# Patient Record
Sex: Female | Born: 1940
Health system: Southern US, Community
[De-identification: ages and names within clinical notes are randomized; demographics above are authoritative.]

## PROBLEM LIST (undated history)

## (undated) DIAGNOSIS — E0590 Thyrotoxicosis, unspecified without thyrotoxic crisis or storm: Secondary | ICD-10-CM

## (undated) DIAGNOSIS — M1990 Unspecified osteoarthritis, unspecified site: Secondary | ICD-10-CM

## (undated) DIAGNOSIS — T8859XA Other complications of anesthesia, initial encounter: Secondary | ICD-10-CM

## (undated) DIAGNOSIS — F32A Depression, unspecified: Secondary | ICD-10-CM

## (undated) DIAGNOSIS — T4145XA Adverse effect of unspecified anesthetic, initial encounter: Secondary | ICD-10-CM

## (undated) DIAGNOSIS — E0500 Thyrotoxicosis with diffuse goiter without thyrotoxic crisis or storm: Secondary | ICD-10-CM

## (undated) DIAGNOSIS — G8929 Other chronic pain: Secondary | ICD-10-CM

## (undated) DIAGNOSIS — M542 Cervicalgia: Secondary | ICD-10-CM

## (undated) DIAGNOSIS — E785 Hyperlipidemia, unspecified: Secondary | ICD-10-CM

## (undated) DIAGNOSIS — M19012 Primary osteoarthritis, left shoulder: Secondary | ICD-10-CM

## (undated) DIAGNOSIS — Z9289 Personal history of other medical treatment: Secondary | ICD-10-CM

## (undated) DIAGNOSIS — I10 Essential (primary) hypertension: Secondary | ICD-10-CM

## (undated) DIAGNOSIS — M797 Fibromyalgia: Secondary | ICD-10-CM

## (undated) DIAGNOSIS — R1310 Dysphagia, unspecified: Secondary | ICD-10-CM

## (undated) DIAGNOSIS — F419 Anxiety disorder, unspecified: Secondary | ICD-10-CM

## (undated) DIAGNOSIS — S92353A Displaced fracture of fifth metatarsal bone, unspecified foot, initial encounter for closed fracture: Secondary | ICD-10-CM

## (undated) DIAGNOSIS — F329 Major depressive disorder, single episode, unspecified: Secondary | ICD-10-CM

## (undated) HISTORY — DX: Unspecified osteoarthritis, unspecified site: M19.90

## (undated) HISTORY — PX: OTHER SURGICAL HISTORY: SHX169

## (undated) HISTORY — DX: Primary osteoarthritis, left shoulder: M19.012

## (undated) HISTORY — DX: Hyperlipidemia, unspecified: E78.5

## (undated) HISTORY — PX: SHOULDER SURGERY: SHX246

## (undated) HISTORY — DX: Cervicalgia: M54.2

## (undated) HISTORY — PX: TONSILLECTOMY: SUR1361

## (undated) HISTORY — DX: Depression, unspecified: F32.A

## (undated) HISTORY — DX: Major depressive disorder, single episode, unspecified: F32.9

## (undated) HISTORY — PX: ABDOMINAL HYSTERECTOMY: SHX81

## (undated) HISTORY — DX: Anxiety disorder, unspecified: F41.9

## (undated) HISTORY — PX: CHOLECYSTECTOMY: SHX55

## (undated) HISTORY — DX: Dysphagia, unspecified: R13.10

## (undated) HISTORY — DX: Essential (primary) hypertension: I10

## (undated) HISTORY — PX: OOPHORECTOMY: SHX86

## (undated) HISTORY — DX: Displaced fracture of fifth metatarsal bone, unspecified foot, initial encounter for closed fracture: S92.353A

## (undated) HISTORY — DX: Other chronic pain: G89.29

---

## 1978-03-14 HISTORY — PX: RHINOPLASTY: SUR1284

## 1998-02-17 ENCOUNTER — Ambulatory Visit (HOSPITAL_COMMUNITY): Admission: RE | Admit: 1998-02-17 | Discharge: 1998-02-17 | Payer: Self-pay | Admitting: Obstetrics and Gynecology

## 1998-04-24 ENCOUNTER — Inpatient Hospital Stay (HOSPITAL_COMMUNITY): Admission: RE | Admit: 1998-04-24 | Discharge: 1998-04-27 | Payer: Self-pay | Admitting: Obstetrics and Gynecology

## 1998-10-01 ENCOUNTER — Ambulatory Visit (HOSPITAL_COMMUNITY): Admission: RE | Admit: 1998-10-01 | Discharge: 1998-10-01 | Payer: Self-pay | Admitting: Family Medicine

## 2002-01-08 ENCOUNTER — Encounter: Payer: Self-pay | Admitting: Internal Medicine

## 2002-01-08 ENCOUNTER — Ambulatory Visit (HOSPITAL_COMMUNITY): Admission: RE | Admit: 2002-01-08 | Discharge: 2002-01-08 | Payer: Self-pay | Admitting: Gastroenterology

## 2002-01-08 LAB — HM COLONOSCOPY

## 2004-03-14 HISTORY — PX: OTHER SURGICAL HISTORY: SHX169

## 2004-11-12 LAB — CONVERTED CEMR LAB: Pap Smear: NORMAL

## 2004-12-08 ENCOUNTER — Ambulatory Visit (HOSPITAL_BASED_OUTPATIENT_CLINIC_OR_DEPARTMENT_OTHER): Admission: RE | Admit: 2004-12-08 | Discharge: 2004-12-08 | Payer: Self-pay | Admitting: Orthopedic Surgery

## 2004-12-08 ENCOUNTER — Ambulatory Visit (HOSPITAL_COMMUNITY): Admission: RE | Admit: 2004-12-08 | Discharge: 2004-12-08 | Payer: Self-pay | Admitting: Orthopedic Surgery

## 2005-06-08 ENCOUNTER — Ambulatory Visit (HOSPITAL_BASED_OUTPATIENT_CLINIC_OR_DEPARTMENT_OTHER): Admission: RE | Admit: 2005-06-08 | Discharge: 2005-06-08 | Payer: Self-pay | Admitting: Orthopedic Surgery

## 2006-02-14 ENCOUNTER — Ambulatory Visit: Payer: Self-pay | Admitting: Internal Medicine

## 2006-02-14 LAB — CONVERTED CEMR LAB
ALT: 22 units/L (ref 0–40)
BUN: 17 mg/dL (ref 6–23)
Chloride: 107 meq/L (ref 96–112)
Chol/HDL Ratio, serum: 2
Creatinine, Ser: 0.9 mg/dL (ref 0.4–1.2)
Glucose, Bld: 98 mg/dL (ref 70–99)
LDL Cholesterol: 71 mg/dL (ref 0–99)
Triglyceride fasting, serum: 40 mg/dL (ref 0–149)

## 2006-04-25 ENCOUNTER — Encounter: Admission: RE | Admit: 2006-04-25 | Discharge: 2006-04-25 | Payer: Self-pay | Admitting: Orthopedic Surgery

## 2006-05-17 ENCOUNTER — Ambulatory Visit: Payer: Self-pay | Admitting: Internal Medicine

## 2006-05-17 ENCOUNTER — Encounter: Admission: RE | Admit: 2006-05-17 | Discharge: 2006-05-17 | Payer: Self-pay | Admitting: Internal Medicine

## 2006-05-17 LAB — CONVERTED CEMR LAB
Eosinophils Absolute: 0.1 10*3/uL (ref 0.0–0.6)
Eosinophils Relative: 2.9 % (ref 0.0–5.0)
Hemoglobin: 13.2 g/dL (ref 12.0–15.0)
MCV: 90.8 fL (ref 78.0–100.0)
Monocytes Absolute: 0.4 10*3/uL (ref 0.2–0.7)
Monocytes Relative: 8 % (ref 3.0–11.0)
Neutro Abs: 3 10*3/uL (ref 1.4–7.7)
Sed Rate: 19 mm/hr (ref 0–25)
Total CK: 120 units/L (ref 7–177)

## 2006-05-23 ENCOUNTER — Ambulatory Visit (HOSPITAL_COMMUNITY): Admission: RE | Admit: 2006-05-23 | Discharge: 2006-05-24 | Payer: Self-pay | Admitting: Orthopedic Surgery

## 2006-06-26 ENCOUNTER — Ambulatory Visit: Payer: Self-pay | Admitting: Internal Medicine

## 2006-06-26 LAB — CONVERTED CEMR LAB
Anti Nuclear Antibody(ANA): NEGATIVE
BUN: 13 mg/dL (ref 6–23)
Calcium: 9.8 mg/dL (ref 8.4–10.5)
Chloride: 108 meq/L (ref 96–112)
Creatinine, Ser: 0.9 mg/dL (ref 0.4–1.2)
Folate: 15.3 ng/mL
GFR calc Af Amer: 81 mL/min
Glucose, Bld: 95 mg/dL (ref 70–99)
Sed Rate: 17 mm/hr (ref 0–25)
Sodium: 143 meq/L (ref 135–145)
TSH: 1.91 microintl units/mL (ref 0.35–5.50)

## 2006-07-11 ENCOUNTER — Encounter: Payer: Self-pay | Admitting: Internal Medicine

## 2006-07-19 ENCOUNTER — Encounter: Payer: Self-pay | Admitting: Internal Medicine

## 2006-07-27 ENCOUNTER — Encounter: Payer: Self-pay | Admitting: Internal Medicine

## 2006-08-14 ENCOUNTER — Encounter: Payer: Self-pay | Admitting: Internal Medicine

## 2006-08-30 ENCOUNTER — Encounter: Payer: Self-pay | Admitting: Internal Medicine

## 2006-09-25 ENCOUNTER — Ambulatory Visit: Payer: Self-pay | Admitting: Internal Medicine

## 2006-09-25 DIAGNOSIS — M1990 Unspecified osteoarthritis, unspecified site: Secondary | ICD-10-CM

## 2006-09-25 DIAGNOSIS — E785 Hyperlipidemia, unspecified: Secondary | ICD-10-CM | POA: Insufficient documentation

## 2006-09-25 DIAGNOSIS — M542 Cervicalgia: Secondary | ICD-10-CM

## 2006-09-25 DIAGNOSIS — I10 Essential (primary) hypertension: Secondary | ICD-10-CM

## 2006-09-28 ENCOUNTER — Ambulatory Visit (HOSPITAL_COMMUNITY): Admission: RE | Admit: 2006-09-28 | Discharge: 2006-09-29 | Payer: Self-pay | Admitting: Orthopedic Surgery

## 2006-11-09 ENCOUNTER — Encounter: Payer: Self-pay | Admitting: Internal Medicine

## 2007-01-16 ENCOUNTER — Ambulatory Visit: Payer: Self-pay | Admitting: Internal Medicine

## 2007-02-02 ENCOUNTER — Ambulatory Visit: Payer: Self-pay | Admitting: Internal Medicine

## 2007-02-21 ENCOUNTER — Encounter: Payer: Self-pay | Admitting: Internal Medicine

## 2007-02-23 ENCOUNTER — Encounter (INDEPENDENT_AMBULATORY_CARE_PROVIDER_SITE_OTHER): Payer: Self-pay | Admitting: *Deleted

## 2007-03-15 DIAGNOSIS — Z9289 Personal history of other medical treatment: Secondary | ICD-10-CM

## 2007-03-15 HISTORY — DX: Personal history of other medical treatment: Z92.89

## 2007-03-15 HISTORY — PX: BACK SURGERY: SHX140

## 2007-05-15 ENCOUNTER — Telehealth (INDEPENDENT_AMBULATORY_CARE_PROVIDER_SITE_OTHER): Payer: Self-pay | Admitting: *Deleted

## 2007-07-24 ENCOUNTER — Ambulatory Visit: Payer: Self-pay | Admitting: Internal Medicine

## 2007-08-27 ENCOUNTER — Encounter: Payer: Self-pay | Admitting: Internal Medicine

## 2007-09-06 ENCOUNTER — Encounter: Payer: Self-pay | Admitting: Internal Medicine

## 2007-09-19 ENCOUNTER — Encounter: Admission: RE | Admit: 2007-09-19 | Discharge: 2007-09-19 | Payer: Self-pay | Admitting: Orthopedic Surgery

## 2007-10-03 ENCOUNTER — Encounter: Admission: RE | Admit: 2007-10-03 | Discharge: 2007-10-03 | Payer: Self-pay | Admitting: Orthopedic Surgery

## 2007-10-29 ENCOUNTER — Ambulatory Visit: Payer: Self-pay | Admitting: Internal Medicine

## 2007-10-31 LAB — CONVERTED CEMR LAB
ALT: 30 units/L (ref 0–35)
AST: 26 units/L (ref 0–37)
LDL Cholesterol: 78 mg/dL (ref 0–99)
Total CHOL/HDL Ratio: 2.2
Triglycerides: 61 mg/dL (ref 0–149)
VLDL: 12 mg/dL (ref 0–40)

## 2007-11-02 ENCOUNTER — Telehealth: Payer: Self-pay | Admitting: Internal Medicine

## 2007-11-05 ENCOUNTER — Inpatient Hospital Stay (HOSPITAL_COMMUNITY): Admission: RE | Admit: 2007-11-05 | Discharge: 2007-11-09 | Payer: Self-pay | Admitting: Neurosurgery

## 2007-11-12 ENCOUNTER — Telehealth: Payer: Self-pay | Admitting: Internal Medicine

## 2007-11-14 ENCOUNTER — Telehealth: Payer: Self-pay | Admitting: Internal Medicine

## 2007-11-16 ENCOUNTER — Encounter: Payer: Self-pay | Admitting: Internal Medicine

## 2007-11-28 ENCOUNTER — Telehealth: Payer: Self-pay | Admitting: Internal Medicine

## 2007-12-11 ENCOUNTER — Encounter: Admission: RE | Admit: 2007-12-11 | Discharge: 2007-12-11 | Payer: Self-pay | Admitting: Neurosurgery

## 2008-02-04 ENCOUNTER — Encounter: Payer: Self-pay | Admitting: Internal Medicine

## 2008-02-25 ENCOUNTER — Encounter: Payer: Self-pay | Admitting: Internal Medicine

## 2008-02-28 ENCOUNTER — Ambulatory Visit: Payer: Self-pay | Admitting: Internal Medicine

## 2008-03-10 ENCOUNTER — Encounter (INDEPENDENT_AMBULATORY_CARE_PROVIDER_SITE_OTHER): Payer: Self-pay | Admitting: *Deleted

## 2008-05-12 HISTORY — PX: NECK SURGERY: SHX720

## 2008-05-13 ENCOUNTER — Encounter: Admission: RE | Admit: 2008-05-13 | Discharge: 2008-05-13 | Payer: Self-pay | Admitting: Neurosurgery

## 2008-05-23 ENCOUNTER — Encounter: Admission: RE | Admit: 2008-05-23 | Discharge: 2008-05-23 | Payer: Self-pay | Admitting: Neurosurgery

## 2008-06-05 ENCOUNTER — Inpatient Hospital Stay (HOSPITAL_COMMUNITY): Admission: RE | Admit: 2008-06-05 | Discharge: 2008-06-06 | Payer: Self-pay | Admitting: Neurosurgery

## 2008-07-03 ENCOUNTER — Ambulatory Visit: Payer: Self-pay | Admitting: Internal Medicine

## 2008-07-03 ENCOUNTER — Telehealth (INDEPENDENT_AMBULATORY_CARE_PROVIDER_SITE_OTHER): Payer: Self-pay | Admitting: *Deleted

## 2008-07-07 LAB — CONVERTED CEMR LAB
Calcium: 9.7 mg/dL (ref 8.4–10.5)
Chloride: 109 meq/L (ref 96–112)
Creatinine, Ser: 0.9 mg/dL (ref 0.4–1.2)
Glucose, Bld: 104 mg/dL — ABNORMAL HIGH (ref 70–99)
Lymphocytes Relative: 30.1 % (ref 12.0–46.0)
Neutrophils Relative %: 58.5 % (ref 43.0–77.0)
Potassium: 3.7 meq/L (ref 3.5–5.1)
RBC: 4.31 M/uL (ref 3.87–5.11)

## 2008-09-29 ENCOUNTER — Encounter: Payer: Self-pay | Admitting: Internal Medicine

## 2008-10-09 ENCOUNTER — Telehealth (INDEPENDENT_AMBULATORY_CARE_PROVIDER_SITE_OTHER): Payer: Self-pay | Admitting: *Deleted

## 2008-11-06 ENCOUNTER — Encounter: Payer: Self-pay | Admitting: Internal Medicine

## 2008-11-06 ENCOUNTER — Ambulatory Visit: Payer: Self-pay | Admitting: Internal Medicine

## 2008-11-06 ENCOUNTER — Other Ambulatory Visit: Admission: RE | Admit: 2008-11-06 | Discharge: 2008-11-06 | Payer: Self-pay | Admitting: Internal Medicine

## 2008-11-06 LAB — HM PAP SMEAR

## 2008-11-06 LAB — CONVERTED CEMR LAB: Vit D, 25-Hydroxy: 27 ng/mL — ABNORMAL LOW (ref 30–89)

## 2008-11-07 ENCOUNTER — Encounter (INDEPENDENT_AMBULATORY_CARE_PROVIDER_SITE_OTHER): Payer: Self-pay | Admitting: *Deleted

## 2008-11-10 LAB — CONVERTED CEMR LAB
ALT: 20 units/L (ref 0–35)
AST: 25 units/L (ref 0–37)
Cholesterol: 157 mg/dL (ref 0–200)
TSH: 2.03 microintl units/mL (ref 0.35–5.50)
VLDL: 10 mg/dL (ref 0.0–40.0)

## 2008-11-11 ENCOUNTER — Encounter (INDEPENDENT_AMBULATORY_CARE_PROVIDER_SITE_OTHER): Payer: Self-pay | Admitting: *Deleted

## 2008-11-11 ENCOUNTER — Encounter: Payer: Self-pay | Admitting: Internal Medicine

## 2008-12-02 ENCOUNTER — Encounter: Admission: RE | Admit: 2008-12-02 | Discharge: 2008-12-02 | Payer: Self-pay | Admitting: Neurosurgery

## 2008-12-02 ENCOUNTER — Ambulatory Visit: Payer: Self-pay | Admitting: Internal Medicine

## 2008-12-08 ENCOUNTER — Encounter (INDEPENDENT_AMBULATORY_CARE_PROVIDER_SITE_OTHER): Payer: Self-pay | Admitting: *Deleted

## 2008-12-08 LAB — CONVERTED CEMR LAB: Fecal Occult Bld: NEGATIVE

## 2008-12-31 ENCOUNTER — Telehealth (INDEPENDENT_AMBULATORY_CARE_PROVIDER_SITE_OTHER): Payer: Self-pay | Admitting: *Deleted

## 2009-02-24 ENCOUNTER — Ambulatory Visit: Payer: Self-pay | Admitting: Internal Medicine

## 2009-02-25 ENCOUNTER — Encounter: Payer: Self-pay | Admitting: Internal Medicine

## 2009-02-26 ENCOUNTER — Encounter: Payer: Self-pay | Admitting: Internal Medicine

## 2009-03-03 ENCOUNTER — Encounter: Admission: RE | Admit: 2009-03-03 | Discharge: 2009-03-03 | Payer: Self-pay | Admitting: Neurosurgery

## 2009-03-04 ENCOUNTER — Encounter: Payer: Self-pay | Admitting: Internal Medicine

## 2009-03-09 ENCOUNTER — Encounter: Payer: Self-pay | Admitting: Internal Medicine

## 2009-03-16 ENCOUNTER — Encounter: Payer: Self-pay | Admitting: Internal Medicine

## 2009-04-16 ENCOUNTER — Encounter: Payer: Self-pay | Admitting: Internal Medicine

## 2009-05-04 ENCOUNTER — Ambulatory Visit: Payer: Self-pay | Admitting: Internal Medicine

## 2009-10-12 ENCOUNTER — Encounter: Payer: Self-pay | Admitting: Internal Medicine

## 2009-11-02 ENCOUNTER — Ambulatory Visit: Payer: Self-pay | Admitting: Internal Medicine

## 2009-11-03 ENCOUNTER — Encounter (INDEPENDENT_AMBULATORY_CARE_PROVIDER_SITE_OTHER): Payer: Self-pay | Admitting: *Deleted

## 2009-11-06 LAB — CONVERTED CEMR LAB
BUN: 17 mg/dL (ref 6–23)
Basophils Relative: 0.6 % (ref 0.0–3.0)
Chloride: 107 meq/L (ref 96–112)
Cholesterol: 161 mg/dL (ref 0–200)
Eosinophils Relative: 2.4 % (ref 0.0–5.0)
GFR calc non Af Amer: 71.37 mL/min (ref >60)
HCT: 38.7 % (ref 36.0–46.0)
LDL Cholesterol: 73 mg/dL (ref 0–99)
MCV: 93.9 fL (ref 78.0–100.0)
Monocytes Relative: 8.3 % (ref 3.0–12.0)
Neutrophils Relative %: 61.9 % (ref 43.0–77.0)
Platelets: 149 10*3/uL — ABNORMAL LOW (ref 150.0–400.0)
Potassium: 4.1 meq/L (ref 3.5–5.1)
RBC: 4.12 M/uL (ref 3.87–5.11)
Total CHOL/HDL Ratio: 2
Triglycerides: 50 mg/dL (ref 0.0–149.0)
VLDL: 10 mg/dL (ref 0.0–40.0)
WBC: 4 10*3/uL — ABNORMAL LOW (ref 4.5–10.5)

## 2009-11-09 ENCOUNTER — Ambulatory Visit: Payer: Self-pay | Admitting: Internal Medicine

## 2009-11-11 ENCOUNTER — Encounter: Payer: Self-pay | Admitting: Internal Medicine

## 2009-11-12 ENCOUNTER — Ambulatory Visit (HOSPITAL_BASED_OUTPATIENT_CLINIC_OR_DEPARTMENT_OTHER): Admission: RE | Admit: 2009-11-12 | Discharge: 2009-11-12 | Payer: Self-pay | Admitting: Orthopedic Surgery

## 2009-11-12 HISTORY — PX: KNEE SURGERY: SHX244

## 2009-12-14 ENCOUNTER — Ambulatory Visit: Payer: Self-pay | Admitting: Internal Medicine

## 2009-12-23 ENCOUNTER — Telehealth: Payer: Self-pay | Admitting: Internal Medicine

## 2009-12-29 ENCOUNTER — Ambulatory Visit: Payer: Self-pay | Admitting: Internal Medicine

## 2010-03-12 ENCOUNTER — Encounter: Payer: Self-pay | Admitting: Internal Medicine

## 2010-04-05 ENCOUNTER — Encounter: Payer: Self-pay | Admitting: Orthopedic Surgery

## 2010-04-11 LAB — CONVERTED CEMR LAB
ALT: 22 units/L (ref 0–35)
AST: 25 units/L (ref 0–37)
BUN: 11 mg/dL (ref 6–23)
Basophils Absolute: 0 10*3/uL (ref 0.0–0.1)
Basophils Relative: 0.6 % (ref 0.0–1.0)
Calcium: 9.1 mg/dL (ref 8.4–10.5)
Chloride: 106 meq/L (ref 96–112)
Eosinophils Relative: 2.6 % (ref 0.0–5.0)
GFR calc non Af Amer: 67 mL/min
MCHC: 34.6 g/dL (ref 30.0–36.0)
Monocytes Absolute: 0.4 10*3/uL (ref 0.2–0.7)
Neutrophils Relative %: 63.4 % (ref 43.0–77.0)
Platelets: 157 10*3/uL (ref 150–400)
Potassium: 3.8 meq/L (ref 3.5–5.1)
Sodium: 140 meq/L (ref 135–145)

## 2010-04-15 NOTE — Consult Note (Signed)
Summary: [facility]  [facility]   Imported By: Lanelle Bal 03/25/2009 14:35:40  _____________________________________________________________________  External Attachment:    Type:   Image     Comment:   External Document

## 2010-04-15 NOTE — Letter (Signed)
Summary: [facility]: Immunoassay Fecal Occult Blood (iFOB) Order Form  [facility] [place]  [address] [place], [place] [address]   Phone: [phone]  Fax: [phone]      [facility]: Immunoassay Fecal Occult Blood (iFOB) Order Form   November 03, 2009 MRN: [id number]   Rebecca [name] 24-Aug-1940   Physicican Name:___jose paz,md______________________  Diagnosis Code:____v76.51______________________      Army Fossa CMA

## 2010-04-15 NOTE — Assessment & Plan Note (Signed)
Summary: rash...Marland Kitchen?SHINGLES//KN   Vital Signs:  Patient profile:   70 year old female Weight:      238.38 pounds Pulse rate:   88 / minute Pulse rhythm:   regular BP sitting:   132 / 88  (left arm) Cuff size:   large  Vitals Entered By: Army Fossa CMA (December 14, 2009 10:31 AM) CC: Possible shingles on arms??  Comments x 10 days [facility] [address] not fasting -burning feeling   History of Present Illness: 10 days ago developed spots in both breasts, they are fading away yesterday developed few-closed  together  spots in the R  arm. they don't itch or hurt but burn got a flu shot today  ROS (+) subjective F (+) itching all over no N V  Current Medications (verified): 1)  Zocor 40 Mg  Tabs (Simvastatin) .... One P.o. Nightly 2)  Fish Oil 3)  Calcium 4)  B Complex 5)  Cinnamon 500mg  6)  B12 1000mg  7)  D-3 1000 8)  B6 100mg  .... Bid 9)  Vitamin C 500mg  .... Two Times A Day 10)  Vicodin 5-500 Mg Tabs (Hydrocodone-Acetaminophen) .... Dr.murphy  Allergies: 1)  ! Oxycodone-Acetaminophen (Oxycodone-Acetaminophen) 2)  * Penicillins Group 3)  Fosamax (Alendronate Sodium)  Past History:  Past Medical History: Reviewed history from 11/02/2009 and no changes required. Hyperlipidemia Hypertension Osteoarthritis Osteoporosis chronic neck pain  Past Surgical History: Cholecystectomy Hysterectomy Oophorectomy Tonsillectomy CTS surgery B shoulder surgery x 2 2008  dr [place] back surgery August 2009 (Dr Wynetta Emery) Neck surgery March 2010 (Dr Wynetta Emery) transposed ulner nerve 10/14/08 (dr. Amanda Pea) R knee surgery 9-201  Physical Exam  General:  alert and well-developed.   Breasts:  no acute lesions in the breast skin Skin:  has 4 lesions in the forearm, they are at different stages , one ofthem is 2mm purulent blister whicj is cultured . no lesions in the chest, rest of the arm, shoulder    Impression & Recommendations:  Problem # 1:  CARBUNCLE AND FURUNCLE OF UPPER  ARM AND FOREARM (ICD-680.3) lesions not consistent w/  a shingles pattern, likely a boil, MRSA? plan: CX treat w/ doxy  Problem # 2:  HYPERLIPIDEMIA (ICD-272.4) patient wonders if she should go back to aspirin, I think so. Added  aspirin to her medication list Her updated medication list for this problem includes:    Zocor 40 Mg Tabs (Simvastatin) ..... One p.o. nightly  Complete Medication List: 1)  Zocor 40 Mg Tabs (Simvastatin) .... One p.o. nightly 2)  Fish Oil  3)  Calcium  4)  B Complex  5)  Cinnamon 500mg   6)  B12 1000mg   7)  D-3 1000  8)  B6 100mg   .... Bid 9)  Vitamin C 500mg   .... Two times a day 10)  Vicodin 5-500 Mg Tabs (Hydrocodone-acetaminophen) .... Dr.murphy 11)  Doxycycline Hyclate 100 Mg Caps (Doxycycline hyclate) .Marland Kitchen.. 1 by mouth two times a day x 5 days 12)  Aspirin 81 Mg Tbec (Aspirin) .Marland Kitchen.. 1 a day  Other Orders: T-Culture, Wound ([id number])  Patient Instructions: 1)  . Prescriptions: DOXYCYCLINE HYCLATE 100 MG CAPS (DOXYCYCLINE HYCLATE) 1 by mouth two times a day x 5 days  #10 x 0   Entered and Authorized by:   Nolon Rod. Cristol Engdahl MD   Signed by:   Nolon Rod. Keshonda Monsour MD on 12/14/2009   Method used:   Print then Give to Patient   RxID:   [id number]

## 2010-04-15 NOTE — Consult Note (Signed)
Summary: [facility]  [facility]   Imported By: Lanelle Bal 04/22/2009 10:51:29  _____________________________________________________________________  External Attachment:    Type:   Image     Comment:   External Document

## 2010-04-15 NOTE — Assessment & Plan Note (Signed)
Summary: CPX/FASTING/KDC   Vital Signs:  Patient profile:   70 year old female Height:      61 inches Weight:      232.25 pounds Pulse rate:   75 / minute Pulse rhythm:   regular BP sitting:   128 / 82  (left arm) Cuff size:   large  Vitals Entered By: Army Fossa CMA (November 02, 2009 8:00 AM) ACC: CPX: fasting Comments Pt unsure of when last Td was given. She believes she had a booster.    History of Present Illness: Here for Medicare AWV:  1.   Risk factors based on Past M, S, F history: yes  2.   Physical Activities: no exercise lately  3.   Depression/mood: mood  is stable, good days -bad days , husband has cancer , counseled  4.   Hearing: denies problems, had hearing checked  in February , was told it was ok  5.   ADL's: totally independent  6.   Fall Risk: low risk, no falls  7.   Home Safety: feels safe at home  8.   Height, weight, &visual acuity: see VS, corrected w/  glasses  9.   Counseling: yes, see above  10.   Labs ordered based on risk factors: yes  11.           Referral Coordination: if needed  12.           Care Plan: see a/p  13.            Cognitive Assessment: cognition, memory and alertness seemed fine  we also discussed the following:  Hyperlipidemia-- good medication compliance  Hypertension-- on no meds , occasionally checks ambulatory BPs < than 140/80 Osteoarthritis-- had problems w/  knee pain ,saw ortho       Current Medications (verified): 1)  Zocor 40 Mg  Tabs (Simvastatin) .... One P.o. Nightly 2)  Fish Oil 3)  Calcium 4)  B Complex 5)  Cinnamon 500mg  6)  B12 1000mg  7)  D-3 1000 8)  B6 100mg  .... Bid 9)  Vitamin C 500mg  .... Two Times A Day  Allergies: 1)  ! Oxycodone-Acetaminophen (Oxycodone-Acetaminophen) 2)  * Penicillins Group 3)  Fosamax (Alendronate Sodium)  Past History:  Past Medical History: Hyperlipidemia Hypertension Osteoarthritis Osteoporosis chronic neck pain  Past Surgical History: Reviewed  history from 11/06/2008 and no changes required. Cholecystectomy Hysterectomy Oophorectomy Tonsillectomy CTS surgery B shoulder surgery x 2 2008  dr [place] back surgery August 2009 (Dr Wynetta Emery) Neck surgery March 2010 (Dr Wynetta Emery) transposed ulner nerve 10/14/08 (dr. Amanda Pea)  Family History: Reviewed history from 01/16/2007 and no changes required. CAD-- sist, bro x 2 MI-- siblings in their 59s DM-- sister colon ca-- no breast ca-- no  Social History: Married husband dx w / cancer  4 children Never Smoked Alcohol use-no Drug use-no Regular exercise--no lately   Review of Systems CV:  Denies chest pain or discomfort and swelling of feet. Resp:  Denies cough and shortness of breath. GI:  Denies bloody stools, diarrhea, nausea, and vomiting. GU:  Denies discharge, dysuria, and hematuria.  Physical Exam  General:  alert, well-developed, and overweight-appearing.   Neck:  no masses, no thyromegaly, and normal carotid upstroke.   Breasts:  No mass, nodules, thickening, tenderness, bulging, retraction, inflamation, nipple discharge or skin changes noted.   Lungs:  normal respiratory effort, no intercostal retractions, no accessory muscle use, and normal breath sounds.   Heart:  normal rate, regular rhythm, no  murmur, and no gallop.   Abdomen:  soft, non-tender, no distention, no masses, no guarding, and no rigidity.   Extremities:  no pretibial edema bilaterally  Psych:  Oriented X3, memory intact for recent and remote, normally interactive, good eye contact, not anxious appearing, and not depressed appearing.     Impression & Recommendations:  Problem # 1:  HEALTH SCREENING (ICD-V70.0)  TD around 2005 per pt pneumonia shot around 2002 and 2008 shingles shot-- avoid for now, husband w/ cancer , he gets chemo   had a normal  colonoscopy at [facility] 2003 , Dr Randa Evens ----> iFOB neg 11-2008 iFOB provided today consider repeat Cscope 2013  s/p hysterectomy for fibroids, no h/o  abnormal PAPs had a neg PAP 2010, at the time there was a ? of  neovascularization , she saw Dr  Rana Snare ----> dx w/ athrophic vaginitis no further PAPs  MMG 12-10  neg  rec to continue w/  SBE   counseled: diet-exerciose-deperession (we talked about her husband's health)   Orders: Medicare -1st Annual Wellness Visit [phone])  Problem # 2:  ? of OSTEOPOROSIS (ICD-733.00) DEXA osteoprosis 2006  (per pt) DEXA  11-08 normal   rec continue w/ ca and vit D  ordering a DEXA----needs it done by November   Orders: T-Vitamin D (25-Hydroxy) [phone]) Radiology Referral (Radiology)  Problem # 3:  HYPERLIPIDEMIA (ICD-272.4) good medication compliance, not able to exercise   Her updated medication list for this problem includes:    Zocor 40 Mg Tabs (Simvastatin) ..... One p.o. nightly  Labs Reviewed: SGOT: 25 (11/06/2008)   SGPT: 20 (11/06/2008)   HDL:75.00 (11/06/2008), 77.7 (10/29/2007)  LDL:72 (11/06/2008), 78 (10/29/2007)  Chol:157 (11/06/2008), 168 (10/29/2007)  Trig:50.0 (11/06/2008), 61 (10/29/2007)  Orders: TLB-Lipid Panel (80061-LIPID) TLB-ALT (SGPT) (84460-ALT) TLB-AST (SGOT) (84450-SGOT)  Problem # 4:  SKIN LESION (ICD-709.9)  also -- reports a stable skin lesion x years at the medial aspect of R knee, recently it become scally, slightly  raised exam : 3x2 mm, slightly  papular, moderately dark lesion,slt rough texture  --has a spot at the R calf that has not healed x months plan: derm referal   Orders: Dermatology Referral (Derma)  Complete Medication List: 1)  Zocor 40 Mg Tabs (Simvastatin) .... One p.o. nightly 2)  Fish Oil  3)  Calcium  4)  B Complex  5)  Cinnamon 500mg   6)  B12 1000mg   7)  D-3 1000  8)  B6 100mg   .... Bid 9)  Vitamin C 500mg   .... Two times a day  Other Orders: Venipuncture (91478) TLB-BMP (Basic Metabolic Panel-BMET) (80048-METABOL) TLB-CBC Platelet - w/Differential (85025-CBCD)  Patient Instructions: 1)  Please schedule a  follow-up appointment in 6 months .

## 2010-04-15 NOTE — Progress Notes (Signed)
Summary: [facility]  Phone Note Call from Patient Call back at Home Phone [phone]   Summary of Call: Patient notes that she is now out of Doxycycline and has noticed more spots appearing on her arms. Should the patient take something else or come in for office visit? Please advise.  Initial call taken by: Lucious Groves CMA,  December 23, 2009 9:03 AM  Follow-up for Phone Call        OV please  Nolon Rod. Paz MD  December 23, 2009 10:12 AM   Additional Follow-up for Phone Call Additional follow up Details #1::        Patient notified and will come today. Additional Follow-up by: Lucious Groves CMA,  December 23, 2009 11:24 AM

## 2010-04-15 NOTE — Assessment & Plan Note (Signed)
Summary: 6 MTH FU/NS/KDC   Vital Signs:  Patient profile:   70 year old female Height:      61 inches Weight:      234.2 pounds BMI:     44.41 Pulse rate:   70 / minute BP sitting:   130 / 80  Vitals Entered By: Shary Decamp (May 04, 2009 8:04 AM) CC: rov - fasting   History of Present Illness: ROV doing well    Current Medications (verified): 1)  Zocor 40 Mg  Tabs (Simvastatin) .... One P.o. Nightly 2)  Fish Oil 3)  Calcium 4)  B Complex 5)  Cinnamon 500mg  6)  B12 1000mg  7)  D-3 1000 8)  B6 100mg  .... Bid 9)  Vitamin C 500mg  .... Two Times A Day  Allergies (verified): 1)  ! Oxycodone-Acetaminophen (Oxycodone-Acetaminophen) 2)  * Penicillins Group 3)  Fosamax (Alendronate Sodium)  Past History:  Past Medical History: Hyperlipidemia Hypertension Osteoarthritis Osteoporosis chronic neck pain  Past Surgical History: Reviewed history from 11/06/2008 and no changes required. Cholecystectomy Hysterectomy Oophorectomy Tonsillectomy CTS surgery B shoulder surgery x 2 2008  dr [place] back surgery August 2009 (Dr Wynetta Emery) Neck surgery March 2010 (Dr Wynetta Emery) transposed ulner nerve 10/14/08 (dr. Amanda Pea)  Social History: Reviewed history from 11/06/2008 and no changes required. Married 4 children Never Smoked Alcohol use-no Drug use-no Regular exercise-yes (walking)  Review of Systems       s/p ENT eval , had a ear effusion, s/p tube placement , feels great Hyperlipidemia--  on zocor, good medication compliance  Hypertension-- varies from 118/67 to122/73    Physical Exam  General:  alert and overweight-appearing.   Lungs:  normal respiratory effort, no intercostal retractions, no accessory muscle use, and normal breath sounds.   Heart:  normal rate, regular rhythm, and no murmur.   Extremities:  no pretibial edema bilaterally    Impression & Recommendations:  Problem # 1:  HYPERTENSION (ICD-401.9) normal BPs  BP today: 130/80 Prior BP:  126/80 (02/24/2009)  Labs Reviewed: K+: 3.7 (07/03/2008) Creat: : 0.9 (07/03/2008)   Chol: 157 (11/06/2008)   HDL: 75.00 (11/06/2008)   LDL: 72 (11/06/2008)   TG: 50.0 (11/06/2008)  Problem # 2:  HYPERLIPIDEMIA (ICD-272.4) doing well , healthy lifestyle encouraged  Her updated medication list for this problem includes:    Zocor 40 Mg Tabs (Simvastatin) ..... One p.o. nightly  Labs Reviewed: SGOT: 25 (11/06/2008)   SGPT: 20 (11/06/2008)   HDL:75.00 (11/06/2008), 77.7 (10/29/2007)  LDL:72 (11/06/2008), 78 (10/29/2007)  Chol:157 (11/06/2008), 168 (10/29/2007)  Trig:50.0 (11/06/2008), 61 (10/29/2007)  Problem # 3:  ? of OSTEOPOROSIS (ICD-733.00) DEXA osteoprosis 2006  (per pt) DEXA  11-08 normal  pt wonders when her next DEXA should be  , I think 1 or 2 years is ok, continue vit d and keep herself active   Complete Medication List: 1)  Zocor 40 Mg Tabs (Simvastatin) .... One p.o. nightly 2)  Fish Oil  3)  Calcium  4)  B Complex  5)  Cinnamon 500mg   6)  B12 1000mg   7)  D-3 1000  8)  B6 100mg   .... Bid 9)  Vitamin C 500mg   .... Two times a day  Patient Instructions: 1)  Please schedule a follow-up appointment in 6 months  (fasting-- yearly) Prescriptions: ZOCOR 40 MG  TABS (SIMVASTATIN) one p.o. nightly  #90 x 3   Entered by:   Shary Decamp   Authorized by:   Nolon Rod. Paz MD   Signed by:  Shary Decamp on 05/04/2009   Method used:   Print then Give to Patient   RxID:   [id number]

## 2010-04-15 NOTE — Consult Note (Signed)
Summary: [facility]  [facility]   Imported By: Lanelle Bal 10/22/2009 10:53:45  _____________________________________________________________________  External Attachment:    Type:   Image     Comment:   External Document

## 2010-04-15 NOTE — Letter (Signed)
Summary: [facility]  [facility]   Imported By: Lanelle Bal 03/18/2009 11:12:38  _____________________________________________________________________  External Attachment:    Type:   Image     Comment:   External Document

## 2010-04-15 NOTE — Assessment & Plan Note (Signed)
Summary: SHINGLES VACCINE/RH........  Nurse Visit   Allergies: 1)  ! Oxycodone-Acetaminophen (Oxycodone-Acetaminophen) 2)  * Penicillins Group 3)  Fosamax (Alendronate Sodium)  Immunizations Administered:  Zostavax # 1:    Vaccine Type: Zostavax    Site: right deltoid    Mfr: Merck    Dose: 0.5 ml    Route: [place]    Given by: Army Fossa CMA    Exp. Date: 10/30/2010    Lot #: [id number]  Orders Added: 1)  Zoster (Shingles) Vaccine Live [90736] 2)  Admin 1st Vaccine [81191]

## 2010-04-15 NOTE — Assessment & Plan Note (Signed)
Summary: FLU SHOT, Jesilyn Easom PT/RH.......  Nurse Visit  CC: Flu shot./kb   Allergies: 1)  ! Oxycodone-Acetaminophen (Oxycodone-Acetaminophen) 2)  * Penicillins Group 3)  Fosamax (Alendronate Sodium)  Orders Added: 1)  Flu Vaccine 26yrs + MEDICARE PATIENTS [Q2039] 2)  Administration Flu vaccine - MCR [G0008]        Flu Vaccine Consent Questions     Do you have a history of severe allergic reactions to this vaccine? no    Any prior history of allergic reactions to egg and/or gelatin? no    Do you have a sensitivity to the preservative Thimersol? no    Do you have a past history of Guillan-Barre Syndrome? no    Do you currently have an acute febrile illness? no    Have you ever had a severe reaction to latex? no    Vaccine information given and explained to patient? yes    Are you currently pregnant? no    Lot Number:[id number]   Exp Date:09/11/2010   Site Given  Left Deltoid IMu

## 2010-05-05 ENCOUNTER — Ambulatory Visit (INDEPENDENT_AMBULATORY_CARE_PROVIDER_SITE_OTHER): Payer: PRIVATE HEALTH INSURANCE | Admitting: Internal Medicine

## 2010-05-05 ENCOUNTER — Encounter: Payer: Self-pay | Admitting: Internal Medicine

## 2010-05-05 DIAGNOSIS — E785 Hyperlipidemia, unspecified: Secondary | ICD-10-CM

## 2010-05-05 DIAGNOSIS — I10 Essential (primary) hypertension: Secondary | ICD-10-CM

## 2010-05-11 NOTE — Assessment & Plan Note (Signed)
Summary: 6 MONTH ROV/CBS   Vital Signs:  Patient profile:   70 year old female Height:      61 inches Weight:      234.50 pounds BMI:     44.47 Pulse rate:   78 / minute Pulse rhythm:   regular BP sitting:   142 / 86  (left arm) Cuff size:   large  Vitals Entered By: Army Fossa CMA (May 05, 2010 9:56 AM) CC: 6 month f/u- fasting  Comments no complaints  [facility] [address]    History of Present Illness: ROV doing well  ROS good medication compliance w/ cholesterol meds plans to start a exercise program, so far has not been very active ambulatory BPs < 138/80 husband was dx w/ cancer, s/p treatmet  Current Medications (verified): 1)  Zocor 40 Mg  Tabs (Simvastatin) .... One P.o. Nightly 2)  Fish Oil 3)  Calcium 4)  B Complex 5)  Cinnamon 500mg  6)  B12 1000mg  7)  D-3 1000 8)  B6 100mg  .... Bid 9)  Vitamin C 500mg  .... Two Times A Day 10)  Aspirin 81 Mg Tbec (Aspirin) .Marland Kitchen.. 1 A Day  Allergies (verified): 1)  ! Oxycodone-Acetaminophen (Oxycodone-Acetaminophen) 2)  * Penicillins Group 3)  Fosamax (Alendronate Sodium)  Past History:  Past Medical History: Reviewed history from 11/02/2009 and no changes required. Hyperlipidemia Hypertension Osteoarthritis Osteoporosis chronic neck pain  Past Surgical History: Reviewed history from 12/14/2009 and no changes required. Cholecystectomy Hysterectomy Oophorectomy Tonsillectomy CTS surgery B shoulder surgery x 2 2008  dr [place] back surgery August 2009 (Dr Wynetta Emery) Neck surgery March 2010 (Dr Wynetta Emery) transposed ulner nerve 10/14/08 (dr. Amanda Pea) R knee surgery 9-201  Physical Exam  General:  alert, well-developed, and overweight-appearing.   Lungs:  normal respiratory effort, no intercostal retractions, no accessory muscle use, and normal breath sounds.   Heart:  normal rate, regular rhythm, no murmur, and no gallop.   Psych:  Oriented X3, memory intact for recent and remote, normally interactive, good  eye contact, not anxious appearing, and not depressed appearing.     Impression & Recommendations:  Problem # 1:  HYPERTENSION (ICD-401.9) ambulatory BPs at goal  BP today: 142/86 Prior BP: 132/88 (12/14/2009)  Labs Reviewed: K+: 4.1 (11/02/2009) Creat: : 0.8 (11/02/2009)   Chol: 161 (11/02/2009)   HDL: 78.20 (11/02/2009)   LDL: 73 (11/02/2009)   TG: 50.0 (11/02/2009)  Problem # 2:  HYPERLIPIDEMIA (ICD-272.4) well controlled  Her updated medication list for this problem includes:    Zocor 40 Mg Tabs (Simvastatin) ..... One p.o. nightly  Labs Reviewed: SGOT: 26 (11/02/2009)   SGPT: 23 (11/02/2009)   HDL:78.20 (11/02/2009), 75.00 (11/06/2008)  LDL:73 (11/02/2009), 72 (11/06/2008)  Chol:161 (11/02/2009), 157 (11/06/2008)  Trig:50.0 (11/02/2009), 50.0 (11/06/2008)  Problem # 3:  discussed her husband's health F2F 15 min plus   Complete Medication List: 1)  Zocor 40 Mg Tabs (Simvastatin) .... One p.o. nightly 2)  Fish Oil  3)  Calcium  4)  B Complex  5)  Cinnamon 500mg   6)  B12 1000mg   7)  D-3 1000  8)  B6 100mg   .... Bid 9)  Vitamin C 500mg   .... Two times a day 10)  Aspirin 81 Mg Tbec (Aspirin) .Marland Kitchen.. 1 a day  Patient Instructions: 1)  Please schedule a follow-up appointment in 6 months . Fasting Physical Exam    Orders Added: 1)  Est. Patient Level III [04540]

## 2010-05-27 LAB — POCT HEMOGLOBIN-HEMACUE: Hemoglobin: 13.8 g/dL (ref 12.0–15.0)

## 2010-06-03 ENCOUNTER — Other Ambulatory Visit: Payer: Self-pay | Admitting: *Deleted

## 2010-06-03 MED ORDER — SIMVASTATIN 40 MG PO TABS: 40.0000 mg | ORAL_TABLET | Freq: Every evening | ORAL | Status: DC

## 2010-06-24 LAB — CBC
HCT: 36.3 % (ref 36.0–46.0)
Hemoglobin: 12.5 g/dL (ref 12.0–15.0)
RBC: 4.05 MIL/uL (ref 3.87–5.11)
RDW: 13.6 % (ref 11.5–15.5)
WBC: 4.1 10*3/uL (ref 4.0–10.5)

## 2010-06-24 LAB — BASIC METABOLIC PANEL
Calcium: 9.8 mg/dL (ref 8.4–10.5)
GFR calc Af Amer: >60 mL/min (ref >60)
GFR calc non Af Amer: >60 mL/min (ref >60)
Potassium: 4.5 mEq/L (ref 3.5–5.1)
Sodium: 143 mEq/L (ref 135–145)

## 2010-07-27 NOTE — Op Note (Signed)
Rebecca [name], Rebecca [name]            ACCOUNT NO.:  [id number]   MEDICAL RECORD NO.:  [id number]          PATIENT TYPE:  INP   LOCATION:  3109                         FACILITY:  [facility]   PHYSICIAN:  Donalee Citrin, M.D.        DATE OF BIRTH:  May 24, 1940   DATE OF PROCEDURE:  11/05/2007  DATE OF DISCHARGE:                               OPERATIVE REPORT   PREOPERATIVE DIAGNOSIS:  1. Grade 2 spondylolisthesis L5-S1.  2. Degenerative disk disease with lumbar spinal stenosis L4-5.  3. Degenerative disk disease with lumbar spinal stenosis L3-4.  4. Degenerate scoliosis L3-S1.   PROCEDURE:  1. Gill decompression L5-S1.  2. Decompressive laminectomy L3-4, L4-5 and excess will be needed with      a standard interbody fusion.  3. Posterior lumbar interbody fusion L3-4, L4-5, and L5-S1 using a      hybrid Telamon PEEK cage packed with local autograft on one side      and with tangent allograft wedge on the other.  4. Pedicle screw fixation L3-S1 using 6.35 Legacy Pedicle Screw      System.  5. Posterolateral arthrodesis L3-S1 using local harvested autograft      mixed with Actifuse.  6. Open reduction spinal deformity L5-S1 with a spondylolisthesis as      well as further degenerative scoliosis L3-S1 and placement of      medium Hemovac drain.   SURGEON:  Donalee Citrin, MD   ASSISTANT:  Kathaleen Maser. Pool, MD   HISTORY OF PRESENT ILLNESS:  The patient is a very pleasant 70 year old  female who has had progressive worsening of the back and bilateral leg  pain worse on the left with intermittent foot drop.  The patient had  numbness, tingling, and weakness in dorsiflexion in her EHL.  The  patient's MRI scan showed a grade 2 spondylolisthesis with severe  foraminal stenosis entrapment in the L5 root but also the S1 root as  well as degenerative foraminal stenosis, degenerative disk disease, disk  herniation at L3-4 and L4-5, and degenerative scoliosis at L3-S1.  The  patient was recommended the risks  and benefits of the operation and  recommended surgery and proceeded forth.   The patient was brought to the OR, induced under general anesthesia,  positioned prone on a Wilson frame.  The back was prepped and draped in  usual sterile fashion.  No preoperative x-ray was done.  After midline  incision made in the low back, a subperiosteal dissection was carried  out on the lamina of L3, L4, L5 and S1.  TPs were exposed at L3, L4, L5  and S1.  The spinous process laminar complex at L5 was noted to be  hypermobile with complete pars defects at this level.  Intraoperative  fluoroscopy confirmed localization of the L3 TP so complete central  decompressions were performed removing the spinous process at L3, L4 and  L5 as well as complete medial facetectomies at L3-4, L4-5 and L5-S1.  There was a marked degenerative of facet complexes and complete  diastasis of the L3-4 facet on the right, complete facet arthropathy  with hour  glass compression of thecal sac and extensive fibrosis and  scar tissue around the pars defects overlying the  fibers noted to be  markedly stenotic flushed with the pedicle at L5-S1.  After complete  medial facetectomies were performed at all three levels, the L5 neural  foramen was unroofed.  The medial inferior border of the pedicle and  facet complex was drilled down to gain access and further unroofed L5  foramina bilaterally.  The L3 and L4 roots were completely unroofed and  decompressed bilaterally.  After all neural foramina were identified,  attention was taken to first pedicle screw placement at L3-4.  Pedicles  drilled with high-speed drill, cannula with the awl probed, tapped with  a 5-5 tap, probed again and 6 x 50 screw inserted L3 bilaterally.  Fluoroscopy was used at each step along the way as well as both external  and internal bony landmarks.  The four screws were also placed in a  similar fashion with 6 x 45 screws inserted at L4.  After these four   screws inserted.  Attention was taken to the interbody work first  working at L5-S1 to reduce the deformity.  The interspace was identified  underneath the L5 root and cleaned out.  Large posterior osteophytes  were bitten off the posterior aspect of the S1 vertebral body to gain  access to the disk space.  A size 7 distractor was inserted on the left  side sequentially and 8 on the right back to a 9 on the left.  This  significantly reduced the deformity at L5-S1 as well as gained access to  the disk space.  The disk space was then cleaned out.  A size 8 cutter  was used to ream up the disk space, using Epstein curettes and  additional curettes, the interspace was cleaned out and prepped for the  cage.  The chisel was not used at L5-S1 due to the proximity in the L5  root and so an 8 x 22 mm Telamon packed with local autograft mixed with  Actifuse was inserted on the right side and the distractor was then  removed.  Disk space was prepared in a similar fashion on the left side.  The local autograft was packed centrally and a left-sided tangent was  inserted.  After all the work had been done, the L5 foramina were much  more decompressed.  Attention was taken then to L4-5.  In a similar  fashion, the disk space was cleaned out.  The pins were appropriate  sized and a 10 distractor was used.  Then the endplates were prepared  with a size 10 cutter and chisel.  Telamon was packed on left side  tangent and the right with a local autograft in between.  Again  fluoroscopy was used at each step along the way confirmed good  positioning of the bone graft and interbody spacers.  Working at L3-4 in  a similar fashion, disk space was prepped, cleaned out and endplates  were scraped.  The central disk was removed.  Telamon was inserted on  the right, tangent on the left, central autograft in between.  After all  the interbody workup completed, attention was taken to finishing the  pedicle screw  placement.  First working on L5 pedicle using bony  landmarks as well as fluoroscopy, the L5 pedicle was identified.  Cannula with the awl probed, tapped with a 5-5 tap and a 6 x 45 screw  was inserted at L5 bilaterally.  Again fluoroscopy was used at each step  along the way, as well as bony landmarks with direct inspection from  within the canal of the medial, superior, and inferior border of the  pedicle.  Then the S1 screw was inserted and after all screws were  placed, the wound was copiously irrigated and meticulous hemostasis was  maintained.  Aggressive decortication was carried out in the sacral ala  and lateral TP bilaterally.  The remainder of the autograft was packed  laterally.  The screw heads were then realigned.  Rods were fashioned  and inserted, top-tightening nuts tightened down at S1, the L5 screws  compressing at S1, the L4 compressing at the L5 and the L3 compressing  at the L4.  All neural foramina were reinspected and noted to be widely  patent and also confirmed no migration of graft material into the disk  space.  Gelfoam and Surgifoam were used to maintain hemostasis in the  canal.  A 4 x 20 cross-link was then inserted and then anchored down.  A  medium Hemovac was placed both inside and outside and allowed two drains  hooked to the same box.  After postop fluoroscopy confirmed good  position of the plate screws and bone grafts, the muscle was  approximated with interrupted Vicryls as well as the fascia tissue was  closed with interrupted Vicryl sequentially in layers with a running 4-0  subcuticular in the skin.  Benzoin and Steri-Strips were applied.  The  patient went to recovery room in stable condition.  At the end of the  case, instrument and sponge counts were correct.           ______________________________  Donalee Citrin, M.D.     GC/MEDQ  D:  11/05/2007  T:  11/06/2007  Job:  [id number]

## 2010-07-27 NOTE — Op Note (Signed)
Rebecca [name], Rebecca [name]            ACCOUNT NO.:  [id number]   MEDICAL RECORD NO.:  [id number]          PATIENT TYPE:  INP   LOCATION:  2899                         FACILITY:  [facility]   PHYSICIAN:  Donalee Citrin, M.D.        DATE OF BIRTH:  1940/03/30   DATE OF PROCEDURE:  06/05/2008  DATE OF DISCHARGE:                               OPERATIVE REPORT   PREOPERATIVE DIAGNOSIS:  Cervical spondylosis and spondylitic  radiculopathy, C6 and C7, left, from the cervical spondylosis with  stenosis at C5-7 and C6-7.   PROCEDURE:  Anterior cervical diskectomy with fusion at C5-6 and C6-7  using an 8-mm allograft wedge at C5-6 and 7-mm allograft wedge at C6-7  and a 40-mm Venture plate with six 09-NA screws.   SURGEON:  Donalee Citrin, MD   ASSISTANT:  Tia Alert, MD   ANESTHESIA:  General endotracheal.   HISTORY OF PRESENT ILLNESS:  The patient is a very pleasant 70 year old  female who has had longstanding back pain going on for years with  radiation down her left arm through her biceps into her forearm, into  her thumb, and first 2 fingers.  The patient's MRI scan showed severe  spondylosis with stenosis and biforaminal stenosis.  The patient had  weakness of her triceps preoperatively and had EMG findings consistent  with radiculopathy and mild neuropathy.  The patient was recommended  anterior cervical discectomy with fusion due to the preoperative exam  failure to conservative treatment, duration of symptoms and MRI  findings.  Risks and benefits of the operation were explained to the  patient and she agreed to proceed forward.   DESCRIPTION OF PROCEDURE:  The patient was brought to the OR, was  induced under general anesthesia, positioned supine with the neck in  slight extension with 5 pounds of Halter traction.  The right side of  her neck was prepped and draped in a routine sterile fashion.  Preop  incision was localized at the appropriate level, so a curvilinear  incision was made  just off the midline to anterior border of the  sternocleidomastoid.  The superficial layer of the platysma was  dissected out and divided longitudinally.  The avascular plane between  the sternocleidomastoid and strap muscle was developed down to the  prevertebral fascia.  Prevertebral fascia was dissected with Kitners.  Intraoperative x-ray confirmed localization at appropriate level with a  needle marked at C4-5.  Large anterior spurs were immediately visualized  at C5-6 and C6-7 disk spaces.  Longus colli was then reflected laterally  and self-retaining retractor was placed.  Then these anterior spurs were  bitten off to identify the disk space.  In the disk space, large  anterior osteophytes were bitten off at C5 and C6 vertebral bodies.  Both disk spaces were drilled down to posterior annulus and osteophyte  complex.  At this point, the operating microscope was draped, brought  into the field, and under microscopic illumination, first working at C6-  7.  This disk space was further drilled down through the posterior  aspects of the posterior longitudinal ligament.  There was a  remarkable  degenerative collapse of the disk space and large posterior osteophyte.  These were all under-bitten aggressively and removed with a 1-cm  Kerrison punch getting underneath the PLL, which was removed in a  piecemeal fashion, decompressing the central canal marching across to  both C7 neural foramina and both C7 nerve roots were skeletonized and  flushing with pedicle.  At the end of the diskectomy, there was no  further stenosis on the central canal or either C7 nerve roots, easily  explored with an angled nerve hook and the thecal sac resumed its normal  anatomical location.  Then, Gelfoam was packed and here attention was  taken to C5-6.  In a similar fashion, C5-6 was aggressively drilled  down.  There was marked posterior osteophyte displaced in the thecal sac  centrally.  These were all  aggressively under-bitten.  Decompressing the  central canal, both C6 pedicles were identified.  Both C6 nerve root  neural foramina were decompressed and nerve roots were identified, and  decompressed and flushed with pedicle.  Then after adequate  decompression had been achieved, the end plates were scraped and size 8  graft was inserted at C5-6 and size 7 graft was inserted at C6-7.  A 40-  mm Venture plate was then placed.  All screws had excellent purchase.  Locking mechanism was engaged.  The wound was then copiously irrigated  and meticulous hemostasis was maintained and platysma was reapproximated  with interrupted Vicryl and the skin was closed with running 4-0  subcuticular.  Benzoin and Steri-Strips applied.  The patient went to  recovery room in stable condition.  At the end of the case, instrument  counts and sponge counts were correct.           ______________________________  Donalee Citrin, M.D.     GC/MEDQ  D:  06/05/2008  T:  06/06/2008  Job:  [id number]

## 2010-07-27 NOTE — Op Note (Signed)
Rebecca [name], Rebecca [name]            ACCOUNT NO.:  [id number]   MEDICAL RECORD NO.:  [id number]          PATIENT TYPE:  OIB   LOCATION:  1618                         FACILITY:  [facility]   PHYSICIAN:  Marlowe Kays, M.D.  DATE OF BIRTH:  25-Jul-1940   DATE OF PROCEDURE:  09/28/2006  DATE OF DISCHARGE:                               OPERATIVE REPORT   PREOPERATIVE DIAGNOSIS:  1. Chronic impingement syndrome with rotator cuff tendinopathy.  2. Degenerative arthritis acromioclavicular joint, left shoulder.   POSTOPERATIVE DIAGNOSIS:  1. Chronic impingement syndrome with rotator cuff tendinopathy.  2. Degenerative arthritis acromioclavicular joint, left shoulder.   OPERATION:  1. Left shoulder arthroscopy with debridement of under surface of      rotator cuff, biceps tendon and labrum.  2. Arthroscopic subacromial decompression.  3. Open resection distal clavicle, left shoulder.   SURGEON:  Marlowe Kays, M.D.   ASSISTANTDruscilla Brownie. Underwood, P.A.-C.   ANESTHESIA:  General.   INDICATIONS FOR PROCEDURE:  She has had similar pathology and surgery in  her right shoulder and has done well.  MRI has demonstrated rotator cuff  tendinopathy with degenerative changes at the [facility] joint.   PROCEDURE:  Prophylactic antibiotics, beach chair position on the Laurel  frame, left shoulder was prepped with DuraPrep and draped in a sterile  field.  Time out performed.  The anatomy of the shoulder joint was  marked out and the proposed incision for the distal clavicle excision,  subacromial space, and the lateral and posterior portals were all  infiltrated with 0.5% Marcaine with adrenaline.  Through a posterior  soft spot portal, I atraumatically entered the glenohumeral joint and on  inspection, there was fraying of the under surface of the rotator cuff,  the biceps tendon, and the labrum.  Because of this, I advanced the  scope between the subscapularis and the biceps tendon and using a  switching  stick, made an anterior incision. Over the switching stick, I  placed a metal cannula entering it into the joint followed by a 4.2  shaver and then I debrided down the under surface of the rotator cuff,  biceps tendon, and labrum with pre and post films being taken.  I then  redirected the scope in the subacromial space through a lateral portal,  introduced the blunt trocar, followed by a 4.2 shaver.  There was a good  bit of subacromial bursitis which I began resecting. She also had a very  prominent coracoacromial ligament which appeared to be creating an  impingement problem. Also, the under surface of the distal clavicle  could be seen as an impingement factor, as well.  I left this alone  because we were going to do an open resection.  I then used the 90  degree ArthroCare vaporizer to begin removing soft tissue from the under  surface of the acromion as well as the offending coracoacromial  ligament.  I followed this with the 4 mm oval bur beginning the  decompression at the under surface of the distal acromion and  alternating back and forth between the bur and the vaporizer until we  had  a wide decompression.  There was no unusual bleeding at the time of  removal of instrumentation.  I then made an incision over the distal  clavicle and with subperiosteal dissection, identified the [facility] joint and  measured 1.5 cm medial and marked the clavicle at this point with the  Bovie.  I next undermined the clavicle.  At this point, I placed two  self-retaining baby Homan's and used a microsaw to amputate the  clavicle, removing it with a towel clip and cautery technique.  The  small remaining spicules of bone were then removed with a small rongeur  and the raw bone coated with bone wax.  I irrigated the wound well with  sterile saline and we closed the distal clavicle excision site with  interrupted #1 Vicryl in the fascia and 2-0 Vicryl in the subcutaneous  tissue with Steri-Strips on the  skin, 4-0 nylon in the three portals.  Betadine and Adaptic were placed around the three portals.  A dry,  sterile dressing and shoulder immobilizer.  She tolerated the procedure  well and was taken to the recovery room in satisfactory condition with  no known complications.           ______________________________  Marlowe Kays, M.D.     JA/MEDQ  D:  09/28/2006  T:  09/28/2006  Job:  [id number]

## 2010-07-30 NOTE — Assessment & Plan Note (Signed)
[facility]                        [place] OFFICE NOTE   JESELLE, HISER                   MRN:          [id number]  DATE:02/14/2006                            DOB:          [birth year]    CHIEF COMPLAINT:  Here to establish.   HISTORY OF THE PRESENT ILLNESS:  Ms. Sillas is a 70 year old white  female who came to the office for the first time to get established.  In  general, she is doing well.  She has developed jaw pain since she  started Fosamax about a year ago.  She also noticed shoulder achiness.  The pain is at the angle of the jaw and is constant and intense at  times.  She ran out of Fosamax 2 weeks ago and now she thinks that the  pain is better.  She also has a history of high cholesterol.  At some  point she was taking Zetia and Niaspan, but due to cost she was switched  to simvastatin.   PAST MEDICAL HISTORY:  1. Hypertension diagnosed more than 20 years ago.  2. Osteoporosis diagnosed by a bone density test on November 2006 at      [facility].  3. Osteoarthritis, particularly of the neck and back.  4. High cholesterol.  She only started to take medication about a year      ago.  5. Status post tonsillectomy, hysterectomy and BSO, as well as      cholecystectomy.  6. History of bilateral hand surgery for carpal tunnel syndrome and      trigger fingers.  7. Status post correction of a deviated septum.  8. Reports a colonoscopy around 2005 at [facility]; there were no      polyps.  9. According to the old records, the last Pap smear and mammogram were      normal by September 2006.  10.She did have a pneumonia shot and her flu shot this year.   FAMILY HISTORY:  1. No history of breast or colon cancer.  2. Heart disease in her father, sister and two brothers.  3. Mother has hypertension.  4. Sister has diabetes.  5. Mother had a stroke.   SOCIAL HISTORY:  Does not smoke.  She drinks socially.  She is  married  and has four children - three daughters, and one son with cerebral palsy  which is mild.   REVIEW OF SYSTEMS:  In general is doing well except for the pain as  described above.  She also wonders about grapefruit and the interaction  with simvastatin.   MEDICATION LIST:  1. Lisinopril 10 one p.o. daily.  2. Relafen 500 one p.o. b.i.d.  3. Simvastatin 40 one p.o. daily.  4. Fosamax 70 one p.o. weekly.  This medication will be discontinued      today, however.  5. Prilosec 20 one p.o. daily p.r.n.  6. Glucosamine, fish oil, and calcium.   ALLERGIES:  PENICILLIN.   PHYSICAL EXAMINATION:  GENERAL:  The patient is alert, oriented, in no  apparent distress.  VITAL SIGNS:  She is 5 feet tall, she weighs 219 pounds.  Pulse 60,  respirations 14, blood pressure 152/90.  The patient reports that her  ambulatory blood pressure is normal.  NECK:  No thyromegaly.  LUNGS:  Clear to auscultation bilaterally.  CARDIOVASCULAR:  Regular rate and rhythm without a murmur.  ABDOMEN:  Not distended, soft, good bowel sounds, no organomegaly.  EXTREMITIES:  No edema.   LABORATORY AND X-RAYS:  Labs from March 2007 show a TSH of 1.6 and total  cholesterol of 200 and a creatinine of 1.0.  Labs from July 7 show a  total cholesterol of 169, HDL of 78, and LDL of 81.  I think that at  that time she was on Niaspan and Zetia.   ASSESSMENT AND PLAN:  1. Hypertension.  Will go ahead and refill her medications and check a      BMP.  I gave the patient low-salt diet information.  2. High cholesterol.  Will refill the medicines and check a fasting      lipid profile and LFTs.  3. Osteoporosis.  The patient will get the actual bone density test      results to me.  She is intolerant to Fosamax; consequently, I      advised her to stop it.  In the meantime, will continue doing      calcium.  Will add vitamin D and daily exercises.  My plan so far      is to redo a bone density test by November 2008 and  decide whether      or not she needs medication then.  4. She is due for a mammogram and the patient will call [facility]      herself.  5. I asked her to get the report from the colonoscopy done at [facility].  6. Office visit in 3 months.     Willow Ora, MD  Electronically Signed    JP/MedQ  DD: 02/14/2006  DT: 02/14/2006  Job #: [id number]

## 2010-07-30 NOTE — Op Note (Signed)
Rebecca [name], Rebecca [name]            ACCOUNT NO.:  [id number]   MEDICAL RECORD NO.:  [id number]          PATIENT TYPE:  AMB   LOCATION:  DSC                          FACILITY:  [facility]   PHYSICIAN:  Artist Pais. Weingold, M.D.DATE OF BIRTH:  [birth year]   DATE OF PROCEDURE:  12/08/2004  DATE OF DISCHARGE:                                 OPERATIVE REPORT   PREOPERATIVE DIAGNOSIS:  Right long and right ring finger stenosing  tenosynovitis.   POSTOPERATIVE DIAGNOSIS:  Right long and right ring finger stenosing  tenosynovitis.   OPERATION PERFORMED:  Trigger finger releases right long and right ring.   SURGEON:  Artist Pais. Mina Marble, M.D.   ASSISTANT:  Aura Fey. Bobbe Medico.   ANESTHESIA:  General.   TOURNIQUET TIME:  15 minutes.   COMPLICATIONS:  None.   DRAINS:  None.   DESCRIPTION OF PROCEDURE:  The patient was taken to the operating room and  after the induction of adequate general anesthesia, the right upper  extremity was prepped and draped in sterile fashion.  Esmarch was used to  exsanguinate the limb.  Tourniquet was inflated to 250 mmHg.  At this point  a transverse incision was made over the A1 pulley areas of the long and ring  finger.  Transverse incisions were made.  The skin was again incised, the  neurovascular bundles were identified and retracted.  The A1 pulleys of the  long and wring fingers were split with a 15 blade.  The FDS and FDP tendons  were lysed of all adhesions.  The wound was then thoroughly irrigated and  both wounds were closed with 5-0 nylon and sterile dressing of Xeroform, 4 x  4s, and compressive wrap was applied.  The patient tolerated the procedure  well, went to recovery room in stable fashion.      Artist Pais Mina Marble, M.D.  Electronically Signed     MAW/MEDQ  D:  12/08/2004  T:  12/09/2004  Job:  [id number]

## 2010-07-30 NOTE — Op Note (Signed)
Rebecca [name], Rebecca [name]            ACCOUNT NO.:  [id number]   MEDICAL RECORD NO.:  [id number]          PATIENT TYPE:  OIB   LOCATION:  1519                         FACILITY:  [facility]   PHYSICIAN:  Marlowe Kays, M.D.  DATE OF BIRTH:  12-22-1940   DATE OF PROCEDURE:  05/23/2006  DATE OF DISCHARGE:  05/24/2006                               OPERATIVE REPORT   PREOPERATIVE DIAGNOSES:  1. Degenerative arthritis acromioclavicular joint.  2. Rotator cuff tendinopathy secondary to impingement syndrome.   POSTOPERATIVE DIAGNOSES:  1. Degenerative arthritis acromioclavicular joint.  2. Rotator cuff tendinopathy secondary to impingement syndrome.   OPERATION:  1. Right shoulder arthroscopy with debridement of underneath surface      of rotator cuff and biceps tendon at labral attachment.  2. Arthroscopic subacromial decompression.  3. Open distal clavicle resection, right shoulder.   SURGEON:  [place]   ASSISTANT:  Mr. Adrian Blackwater.   ANESTHESIA:  General.   PATHOLOGY AND JUSTIFICATION FOR PROCEDURE:  She actually has had  bilateral shoulder pain in the right shoulder on MRI of April 25, 2006.  She had the diagnoses noted above and consequently she is here  today for the above-mentioned surgery.   PROCEDURE:  Prophylactic antibiotic, satisfactory general anesthesia,  beach-chair position on the Allen frame, right shoulder girdle was  prepped with DuraPrep, draped in a sterile field.  Anatomy in the  shoulder joint including the proposed distal clavicle excision were  marked out, and the subacromial space, distal clavicle incision site,  and posterior and lateral portals infiltrated with 0.5% Marcaine with  adrenalin.  Through a posterior soft spot portal, I atraumatically  entered the glenohumeral joint.  There was a good bit of fraying of the  underneath surface of the rotator cuff.  The biceps appeared to be  mainly intact but at its base, there was marked fraying with  degeneration of the labrum at this point.  The humeral head and glenoid  looked good, and the labrum looked intact.  I advanced the scope between  the biceps tendon, subscapularis and using the switching stick made an  anterior incision over the switching stick I placed a metal cannula  which I placed in the joint and then used a 4.2 shaver to debride down  the undersurface of the rotator cuff and the labrum at the biceps  attachment.  Final pictures were taken.  I then redirected the scope in  the subacromial space and through a lateral portal introduced the 4.2  shaver.  She had a fairly flagrant bursitis which I cleaned up with the  shaver.  I then brought in the articular vaporizer and began removing  soft tissue from the underneath surface of the acromion and along the  way, identified the [facility] joint.  The distal clavicle was clearly a  pathologic feature.  I then brought in the 4-mm oval bur and  decompressed the distal anterior acromion leaving the distal clavicle  intact for resection later.  We took documentary pictures of the  decompression with pictures with her arm to her side and the arm  abducted.  I then removed  all fluid possible from the subacromial space  and made an open incision over the distal clavicle, identifying the [facility]  joint.  I measured 1-1.5 cm medial to it and then undermined the  clavicle at this point after Marcaine with cautery with key elevator  baby Bennetts.  With a microsaw, I amputated the clavicle and removed  the fragment with towel clip and cautery technique.  Remaining clavicle  was noted to be smooth.  I covered it with bone wax, and we then placed  Gelfoam in the resection site.  I infiltrated this wound as well as the  portals once again and subacromial space with 0.5% Marcaine with  adrenalin and closed the distal clavicle excision wound with interrupted  #1 Vicryl in the periosteal fascial complex, 2-0 Vicryl subcutaneous  tissue, Steri-Strips on  the skin.  Dry sterile dressing and shoulder  immobilizer were applied.  She tolerated the procedure well and was  taken to the recovery room in satisfactory condition with no known  complications.           ______________________________  Marlowe Kays, M.D.     JA/MEDQ  D:  05/23/2006  T:  05/25/2006  Job:  [id number]

## 2010-07-30 NOTE — Op Note (Signed)
   NAME:  Rebecca [name], Rebecca [name]                      ACCOUNT NO.:  [id number]   MEDICAL RECORD NO.:  [id number]                   PATIENT TYPE:  AMB   LOCATION:  ENDO                                 FACILITY:  [facility]   PHYSICIAN:  James L. Malon Kindle., M.D.          DATE OF BIRTH:  1940/10/24   DATE OF PROCEDURE:  01/08/2002  DATE OF DISCHARGE:                                 OPERATIVE REPORT   PROCEDURE PERFORMED:  Colonoscopy.   ENDOSCOPIST:  Llana Aliment. Edwards, M.D.   MEDICATIONS:  Fentanyl 70 mcg, Versed 7 mg IV.   INDICATIONS FOR PROCEDURE:  Colon cancer screening, lower abdominal pain.   DESCRIPTION OF PROCEDURE:  The procedure had been explained to the patient  and consent obtained.  With the patient in the left lateral decubitus  position, the Olympus pediatric video colonoscope was inserted and advanced  under direct visualization.  The prep was excellent.  The cecum was reached.  The ileocecal valve and appendiceal orifice were seen.  The ascending colon,  transverse colon, descending and sigmoid colon were seen well.  There was no  significant diverticular disease.  The antrum was free of polyps.  The scope  was withdrawn.  The patient tolerated the procedure well.   ASSESSMENT:  1. Right lower quadrant pain without any obvious abnormalities on the     colonoscopy.  2. Colon cancer screening, normal colonoscopy.   PLAN:  Will see back in the office in two to three months.                                                James L. Malon Kindle., M.D.    Waldron Session  D:  01/08/2002  T:  01/08/2002  Job:  [id number]   cc:   Brett Canales A. Cleta Alberts, M.D.  [address]  [place]  [place] [address]  Fax: [phone]

## 2010-07-30 NOTE — Op Note (Signed)
NAME:  Rebecca [name], Rebecca [name]            ACCOUNT NO.:  [id number]   MEDICAL RECORD NO.:  [id number]          PATIENT TYPE:  AMB   LOCATION:  DSC                          FACILITY:  [facility]   PHYSICIAN:  Matthew A. Weingold, M.D.DATE OF BIRTH:  June 29, 1940   DATE OF PROCEDURE:  07/13/2005  DATE OF DISCHARGE:  06/08/2005                                 OPERATIVE REPORT   PREOPERATIVE DIAGNOSIS:  Left fifth digit stenosing tenosynovitis.   POSTOPERATIVE DIAGNOSIS:  Left fifth digit stenosing tenosynovitis.   PROCEDURE:  Left fifth digit A1 pulley release.   SURGEON:  Artist Pais. Mina Marble, M.D.   ASSISTANT:  None.   ANESTHESIA:  Monitored anesthesia care with combination of 2% plain  lidocaine and 0.25% Marcaine digital block, 3 cc.   TOURNIQUET TIME:  Ten minutes.   COMPLICATIONS:  None.   DRAINS:  None.   OPERATIVE REPORT:  Patient was taken to the operating room with induction of  adequate IV sedation.  The left upper extremity was prepped and draped in  the usual sterile fashion.  Esmarch was used to exsanguinate the limb.  The  tourniquet was then inflated to 250 mmHg.  At this point in time, 3cc of a  combination of 2% plain lidocaine and 0.25% Marcaine was injected into the  A1 pulley area of the left fifth digit.  Once the anesthesia was obtained, a  transverse incision was made.  Neurovascular bundles were identified and  retracted.  The A1 pulley was divided with a 15 blade.  The FDS and FDP  tendon were lysed of all adhesions.  It was irrigated and was closed with 4-  0 nylon x2 sutures.  Xeroform, 4x4s, compressive wrap was applied.  The  patient tolerated the procedure well and was __________.      Artist Pais Mina Marble, M.D.  Electronically Signed     MAW/MEDQ  D:  07/13/2005  T:  07/13/2005  Job:  [id number]

## 2010-07-30 NOTE — Discharge Summary (Signed)
Rebecca [name], Rebecca [name]            ACCOUNT NO.:  [id number]   MEDICAL RECORD NO.:  [id number]          PATIENT TYPE:  INP   LOCATION:  3032                         FACILITY:  [facility]   PHYSICIAN:  Donalee Citrin, M.D.        DATE OF BIRTH:  05-12-1940   DATE OF ADMISSION:  11/05/2007  DATE OF DISCHARGE:  11/09/2007                               DISCHARGE SUMMARY   ADMITTING DIAGNOSIS:  Degenerative disk disease, lumbar spinal stenosis,  and degenerative scoliosis at L3-L4, L4-L5, and L5-S1.   PROCEDURE DURING THIS HOSPITALIZATION:  Gill decompression of L5-S1,  decompressive laminectomy to L3-L5, and posterior lumbar fusions L3-L4,  L4-L5, and L5-S1.   HOSPITAL COURSE:  The patient was admitted __________ to the operating  room __________ performed the procedure.  Postoperatively, the patient  did very well, recovering on the floor.  On the floor, the patient was  progressively mobilized, and doing very well with significant  improvement of her leg pain.  She did have significant back pain and her  hematocrit was postoperatively 24.  She was transfused a unit and did  come up to 26.  The patient continued to improve over the next couple of  days.  Physical and occupational therapy did well for the patient, and  over the next couple of days, the patient was ambulating, voiding  spontaneously with a walker.  Her incision was clean and dry, and she  was able to be discharged home and schedule a followup in 2 weeks in  Neurosurgery.  At the time of discharge, the patient was tolerating her  p.o. pain medications.           ______________________________  Donalee Citrin, M.D.     GC/MEDQ  D:  12/20/2007  T:  12/20/2007  Job:  [id number]

## 2010-07-30 NOTE — Discharge Summary (Signed)
[name], [name]            ACCOUNT NO.:  [id number]   MEDICAL RECORD NO.:  [id number]          PATIENT TYPE:  INP   LOCATION:  3032                         FACILITY:  [facility]   PHYSICIAN:  Donalee Citrin, M.D.        DATE OF BIRTH:  [birth year]   DATE OF ADMISSION:  11/05/2007  DATE OF DISCHARGE:  11/09/2007                               DISCHARGE SUMMARY   ADMITTING DIAGNOSIS:  Degenerative scoliosis and lumbar spinal stenosis  with spondylolisthesis at L3-4, L4-5, and L5-S1.   PROCEDURE:  Decompressive laminectomies at L3-4, L4-5, and L5-S1 and  posterior lumbar interbody fusion, L3-4, L4-5, and L5-S1.   HOSPITAL COURSE:  The patient is a 70 year old female who was admitted  as an EMA and went to the operating room and underwent the  aforementioned procedure.  Postop, the patient did very well, went to  the recovery room in the floor.  The patient was observed overnight and  had significant improvement in her leg pain, had significant back pain  postoperatively; however, this progressively improved over the next  couple of days.  The patient was mobilized, placed on occupational  therapy, had a little bit of low-grade temperature to 100 on hospital  day #1, was given incentive spirometer to use, and this did improve her  blood pressure, maintained very low, so all of her antihypertensives  were held.  This progressively improved after more anesthesia got out of  her system and the pain medication, came under better control.  This  patient did very well with physical therapy, was stable to be discharged  home on postop #4 and at the time of discharge, the patient was  ambulating and voiding spontaneously.  Pain was well controlled.  The  incision was clean and dry, and she was sent home with p.o. pain  medication.           ______________________________  Donalee Citrin, M.D.     GC/MEDQ  D:  12/12/2007  T:  12/13/2007  Job:  [id number]

## 2010-07-30 NOTE — Op Note (Signed)
NAME:  RAEGHAN, DEMETER            ACCOUNT NO.:  [id number]   MEDICAL RECORD NO.:  [id number]          PATIENT TYPE:  AMB   LOCATION:  DSC                          FACILITY:  [facility]   PHYSICIAN:  Matthew A. Weingold, M.D.DATE OF BIRTH:  December 16, 1940   DATE OF PROCEDURE:  06/08/2005  DATE OF DISCHARGE:                                 OPERATIVE REPORT   PREOPERATIVE DIAGNOSIS:  Chronic left fifth digit, stenosing tenosynovitis.   POSTOPERATIVE DIAGNOSIS:  Chronic left fifth digit, stenosing tenosynovitis.   OPERATION PERFORMED:  Left fifth digit, A1 pulley release.   SURGEON:  Artist Pais. Mina Marble, M.D.   ASSISTANT:  None.   ANESTHESIA:  Monitored anesthesia care with 3 mL of combination of 2% plain  lidocaine, 0.25% plain Marcaine.   TOURNIQUET TIME:  Seven minutes.   DESCRIPTION OF PROCEDURE:  The patient was taken to the operating room.  With the induction of adequate IV sedation, the left upper extremity was  prepped and draped in the usual sterile fashion.  An Esmarch was used to  exsanguinate the limb.  Tourniquet was inflated to 250 mmHg.  At this point  3 mL of a combination of 2% plain lidocaine and 0.25% plain Marcaine was  injected in the A-1 pulley area of the fifth digit, left hand.  A transverse  incision was made over the A1 pulley.  Neurovascular bundles were identified  and retracted.  The A1 pulley was split with a 15 blade.  The FDS and FDP  tendons were lysed of all adhesions.  The wound was irrigated and this was  closed with 4-0 nylon.  Xeroform, 4 x 4s, compressive dressing was applied.  The patient tolerated the procedure well went to recovery room in stable  fashion.      Artist Pais Mina Marble, M.D.  Electronically Signed     MAW/MEDQ  D:  06/08/2005  T:  06/09/2005  Job:  [id number]

## 2010-11-03 ENCOUNTER — Encounter: Payer: PRIVATE HEALTH INSURANCE | Admitting: Internal Medicine

## 2010-11-24 ENCOUNTER — Ambulatory Visit (INDEPENDENT_AMBULATORY_CARE_PROVIDER_SITE_OTHER): Payer: PRIVATE HEALTH INSURANCE | Admitting: Internal Medicine

## 2010-11-24 ENCOUNTER — Encounter: Payer: Self-pay | Admitting: Internal Medicine

## 2010-11-24 DIAGNOSIS — M1990 Unspecified osteoarthritis, unspecified site: Secondary | ICD-10-CM

## 2010-11-24 DIAGNOSIS — I10 Essential (primary) hypertension: Secondary | ICD-10-CM

## 2010-11-24 DIAGNOSIS — E785 Hyperlipidemia, unspecified: Secondary | ICD-10-CM

## 2010-11-24 DIAGNOSIS — Z0000 Encounter for general adult medical examination without abnormal findings: Secondary | ICD-10-CM | POA: Insufficient documentation

## 2010-11-24 DIAGNOSIS — M8580 Other specified disorders of bone density and structure, unspecified site: Secondary | ICD-10-CM | POA: Insufficient documentation

## 2010-11-24 DIAGNOSIS — M810 Age-related osteoporosis without current pathological fracture: Secondary | ICD-10-CM

## 2010-11-24 LAB — LIPID PANEL
Cholesterol: 157 mg/dL (ref 0–200)
HDL: 78.8 mg/dL (ref >39.00)

## 2010-11-24 LAB — BASIC METABOLIC PANEL
Calcium: 9.5 mg/dL (ref 8.4–10.5)
GFR: 68.32 mL/min (ref >60.00)
Glucose, Bld: 96 mg/dL (ref 70–99)
Potassium: 4 mEq/L (ref 3.5–5.1)
Sodium: 142 mEq/L (ref 135–145)

## 2010-11-24 LAB — TSH: TSH: 2.05 u[IU]/mL (ref 0.35–5.50)

## 2010-11-24 LAB — AST: AST: 39 U/L — ABNORMAL HIGH (ref 0–37)

## 2010-11-24 LAB — ALT: ALT: 40 U/L — ABNORMAL HIGH (ref 0–35)

## 2010-11-24 NOTE — Assessment & Plan Note (Addendum)
Currently doing well, NSAIDs GI precautions discussed

## 2010-11-24 NOTE — Assessment & Plan Note (Addendum)
TD around 2005 per pt pneumonia shot around 2002 and 2008 shingles shot-- 12-2009 had a normal  colonoscopy at [facility] 2003 , Dr Randa Evens ----> iFOB neg 11-2008 and 10-2009 iFOB provided today consider repeat Cscope 2013  s/p hysterectomy for fibroids, no h/o abnormal PAPs had a neg PAP 2010, at the time there was a ? of  neovascularization , she saw Dr  Rana Snare ----> dx w/ athrophic vaginitis no further PAPs MMG 12-10  And 02-2010 neg  rec to continue w/  SBE  +FH CAD, pt asx, on statins an ASA counseled: diet-exercise- anxiety (we talked about her husband's health)

## 2010-11-24 NOTE — Assessment & Plan Note (Addendum)
DEXA osteoprosis 2006  (per pt) DEXA  11-08 normal  Due for a DEXA

## 2010-11-24 NOTE — Progress Notes (Signed)
Subjective:    Patient ID: Rebecca [name], female    DOB: 07/30/1940, 70 y.o.   MRN: [id number]  HPI Here for Medicare AWV: 1. Risk factors based on Past M, S, F history: yes  2. Physical Activities: no exercise lately but active at home  3. Depression/mood: mood  is stable, unfortunately last week she learn her husband has a new cancer              (leukemia); we had a long discussion, she is taking things the best she can under the circumstances  4. Hearing: denies problems, had hearing checked last year, was ok 5. ADL's: totally independent  6. Fall Risk: low risk, no falls  7. Home Safety: feels safe at home  8. Height, weight, &visual acuity: see VS, corrected w/  glasses  9. Counseling: yes 10. Labs ordered based on risk factors: yes  11.        Referral Coordination: if needed  12.        Care Plan: see a/p  13.         Cognitive Assessment: cognition, memory and alertness seemed fine  we also discussed the following:  Hyperlipidemia-- good medication compliance  Hypertension-- on no meds , occasionally checks ambulatory BPs < than 140/80 Osteoarthritis-- had knee scope done last year , currently doing well   Past Medical History  Diagnosis Date  . Hyperlipidemia   . Hypertension   . Arthritis   . Osteoporosis   . Chronic neck pain    Past Surgical History  Procedure Date  . Cholecystectomy   . Abdominal hysterectomy   . Tonsillectomy   . Oophorectomy   . Shoulder surgery 2008    x 2/Dr. Leslee Home  . Back surgery 2009    Dr Wynetta Emery  . Neck surgery 05/2008    Dr Wynetta Emery  . Knee surgery 9-11    right,  scope  . Arm surgery     transposed ulner nerve 10/14/08 (dr. Amanda Pea)     History   Social History  . Marital Status: Married    Spouse Name: N/A    Number of Children: 4  . Years of Education: N/A   Occupational History  . Not on file.   Social History Main Topics  . Smoking status: Never Smoker   . Smokeless tobacco: Never Used  . Alcohol Use: No  .  Drug Use: No  . Sexually Active: Not on file   Other Topics Concern  . Not on file   Social History Narrative   Husband has cancer      Family History: CAD-- sist, bro x 2 MI-- siblings in their 69s DM-- sister colon ca-- no breast ca-- no  Review of Systems  Respiratory: Negative for cough and shortness of breath.   Cardiovascular: Negative for chest pain and leg swelling.  Gastrointestinal: Negative for blood in stool and abdominal distention.  Genitourinary: Negative for dysuria, hematuria and difficulty urinating.       Objective:   Physical Exam  Constitutional: She is oriented to person, place, and time. She appears well-developed. No distress.  HENT:  Head: Normocephalic and atraumatic.  Neck: No thyromegaly present.       Nl carotid pulses  Cardiovascular: Normal rate, regular rhythm and normal heart sounds.   No murmur heard. Pulmonary/Chest: Effort normal and breath sounds normal. No respiratory distress. She has no wheezes. She has no rales.  Abdominal: Soft. She exhibits no distension. There is no tenderness.  There is no rebound and no guarding.  Genitourinary:       Breast exam w/o dominant  mass, no axillary lymph node  Musculoskeletal: She exhibits no edema.  Neurological: She is alert and oriented to person, place, and time.  Skin: Skin is warm and dry. She is not diaphoretic.  Psychiatric: She has a normal mood and affect. Her behavior is normal. Judgment and thought content normal.          Assessment & Plan:

## 2010-11-24 NOTE — Assessment & Plan Note (Signed)
On no meds, BP wnl

## 2010-11-24 NOTE — Assessment & Plan Note (Signed)
Good med comliance , labs

## 2010-11-29 NOTE — Progress Notes (Signed)
Left a message for pt to return call about lab results.  

## 2010-11-29 NOTE — Progress Notes (Signed)
Quick Note:  Pt is aware. ______ 

## 2010-12-10 ENCOUNTER — Ambulatory Visit (INDEPENDENT_AMBULATORY_CARE_PROVIDER_SITE_OTHER)
Admission: RE | Admit: 2010-12-10 | Discharge: 2010-12-10 | Disposition: A | Discharge status: Home / Self Care | Payer: PRIVATE HEALTH INSURANCE | Source: Ambulatory Visit

## 2010-12-10 DIAGNOSIS — M810 Age-related osteoporosis without current pathological fracture: Secondary | ICD-10-CM

## 2010-12-14 ENCOUNTER — Encounter: Payer: Self-pay | Admitting: Internal Medicine

## 2010-12-16 ENCOUNTER — Encounter: Payer: Self-pay | Admitting: *Deleted

## 2010-12-21 ENCOUNTER — Other Ambulatory Visit: Payer: Self-pay | Admitting: Internal Medicine

## 2010-12-21 ENCOUNTER — Telehealth: Payer: Self-pay

## 2010-12-21 ENCOUNTER — Other Ambulatory Visit: Payer: PRIVATE HEALTH INSURANCE

## 2010-12-21 DIAGNOSIS — Z0000 Encounter for general adult medical examination without abnormal findings: Secondary | ICD-10-CM

## 2010-12-21 LAB — FECAL OCCULT BLOOD, IMMUNOCHEMICAL: Fecal Occult Bld: NEGATIVE

## 2010-12-21 NOTE — Telephone Encounter (Signed)
Pt aware of bone density results

## 2010-12-27 LAB — BASIC METABOLIC PANEL
Chloride: 110
GFR calc non Af Amer: 50 — ABNORMAL LOW
Glucose, Bld: 96
Potassium: 4.7
Sodium: 140

## 2010-12-27 LAB — PROTIME-INR
INR: 1
Prothrombin Time: 13

## 2010-12-27 LAB — HEMOGLOBIN AND HEMATOCRIT, BLOOD: Hemoglobin: 12.4

## 2010-12-27 LAB — APTT: aPTT: 30

## 2011-04-05 ENCOUNTER — Encounter: Payer: Self-pay | Admitting: Internal Medicine

## 2011-05-31 ENCOUNTER — Other Ambulatory Visit: Payer: Self-pay | Admitting: Orthopedic Surgery

## 2011-05-31 ENCOUNTER — Ambulatory Visit
Admission: RE | Admit: 2011-05-31 | Discharge: 2011-05-31 | Disposition: A | Discharge status: Home / Self Care | Payer: Medicare Other | Source: Ambulatory Visit | Attending: Orthopedic Surgery | Admitting: Orthopedic Surgery

## 2011-05-31 DIAGNOSIS — R609 Edema, unspecified: Secondary | ICD-10-CM

## 2011-05-31 DIAGNOSIS — M79662 Pain in left lower leg: Secondary | ICD-10-CM

## 2011-11-25 ENCOUNTER — Ambulatory Visit (INDEPENDENT_AMBULATORY_CARE_PROVIDER_SITE_OTHER): Payer: Medicare Other | Admitting: Internal Medicine

## 2011-11-25 ENCOUNTER — Encounter: Payer: Self-pay | Admitting: Internal Medicine

## 2011-11-25 VITALS — BP 140/80 | HR 59 | Temp 97.7°F | Ht 61.0 in | Wt 209.0 lb

## 2011-11-25 DIAGNOSIS — M8580 Other specified disorders of bone density and structure, unspecified site: Secondary | ICD-10-CM

## 2011-11-25 DIAGNOSIS — I10 Essential (primary) hypertension: Secondary | ICD-10-CM

## 2011-11-25 DIAGNOSIS — Z23 Encounter for immunization: Secondary | ICD-10-CM

## 2011-11-25 DIAGNOSIS — Z0000 Encounter for general adult medical examination without abnormal findings: Secondary | ICD-10-CM

## 2011-11-25 DIAGNOSIS — E785 Hyperlipidemia, unspecified: Secondary | ICD-10-CM

## 2011-11-25 DIAGNOSIS — M1990 Unspecified osteoarthritis, unspecified site: Secondary | ICD-10-CM

## 2011-11-25 DIAGNOSIS — M949 Disorder of cartilage, unspecified: Secondary | ICD-10-CM

## 2011-11-25 LAB — CBC WITH DIFFERENTIAL/PLATELET
Basophils Relative: 0.8 % (ref 0.0–3.0)
Eosinophils Relative: 3.4 % (ref 0.0–5.0)
HCT: 39.5 % (ref 36.0–46.0)
Hemoglobin: 12.9 g/dL (ref 12.0–15.0)
Lymphs Abs: 1.3 10*3/uL (ref 0.7–4.0)
Monocytes Relative: 8.5 % (ref 3.0–12.0)
Neutro Abs: 2.6 10*3/uL (ref 1.4–7.7)
RBC: 4.21 Mil/uL (ref 3.87–5.11)
WBC: 4.5 10*3/uL (ref 4.5–10.5)

## 2011-11-25 LAB — BASIC METABOLIC PANEL
CO2: 25 mEq/L (ref 19–32)
Calcium: 9.2 mg/dL (ref 8.4–10.5)
Creatinine, Ser: 0.9 mg/dL (ref 0.4–1.2)
Glucose, Bld: 91 mg/dL (ref 70–99)

## 2011-11-25 LAB — LIPID PANEL: Total CHOL/HDL Ratio: 2

## 2011-11-25 MED ORDER — SIMVASTATIN 40 MG PO TABS: 40.0000 mg | ORAL_TABLET | Freq: Every evening | ORAL | Status: DC

## 2011-11-25 NOTE — Assessment & Plan Note (Signed)
DEXA osteoprosis 2006  (per pt) DEXA  11-08 normal  DEXA  9-12 mild osteopenia, rec exercise, ca and vit D

## 2011-11-25 NOTE — Assessment & Plan Note (Signed)
BP normal w/o meds

## 2011-11-25 NOTE — Assessment & Plan Note (Signed)
good medication compliance Labs-- FLP ast alt

## 2011-11-25 NOTE — Progress Notes (Signed)
Subjective:    Patient ID: Rebecca [name], female    DOB: 04-Oct-1940, 71 y.o.   MRN: [id number]  HPI Here for Medicare AWV:  1. Risk factors based on Past M, S, F history: yes   2. Physical Activities: no exercise lately but remains active at home   3. Depression/mood: Doing okay, husband has cancer but overall she is handling things well 4. Hearing: denies problems, no problems noted w/ normal conversation 5.ADL's: totally independent   6. Fall Risk: prevention discussed   7. Home Safety: feels safe at home   8. Height, weight, &visual acuity: see VS, corrected w/  glasses , to see ete doctor 12-2011  9. Counseling: yes 10. Labs ordered based on risk factors: yes   11. Referral Coordination: if needed   12. Care Plan: see a/p   13. Cognitive Assessment: cognition, memory and alertness seemed fine  we also discussed the following: DJD-- contemplating L TKR High cholesterol, good medication compliance Osteopenia, good compliance with calcium, vitamin D. Patient has noted weight loss, she's not sure why, did report that she stopped eating as much as before just because she is not hungry. (Emotionally she is doing okay)    Past Medical History: Hyperlipidemia Hypertension Osteoarthritis Osteoporosis chronic neck pain  Past Surgical History: Cholecystectomy Hysterectomy, Oophorectomy Tonsillectomy CTS surgery B shoulder surgery x 2 2008  dr [place] back surgery August 2009 (Dr Wynetta Emery) Neck surgery March 2010 (Dr Wynetta Emery) transposed ulner nerve 10/14/08 (dr. Amanda Pea) Shoulder surgery 08-2011, Dr Geroge Baseman knee surgery 11-2009 Knee scope 05-2011   Social History: Married, husband dx w / cancer , 4 children Never Smoked Alcohol use-no Drug use-no   Family History: CAD-- sist, bro x 2 Stroke-- sister  DM-- sister colon ca-- no breast ca-- no  Review of Systems No chest pain or shortness or breath No nausea, vomiting, diarrhea or blood in the stools No dysuria,  gross hematuria, vaginal bleeding. Current Outpatient Rx  Name Route Sig Dispense Refill  . ASPIRIN 81 MG PO TABS Oral Take 81 mg by mouth daily.      . B COMPLEX PO TABS Oral Take 1 tablet by mouth daily.      Marland Kitchen CALCIUM CARBONATE 200 MG PO CAPS Oral Take 250 mg by mouth 2 (two) times daily with a meal.      . D3-1000 PO Oral Take 1,000 Units by mouth daily.      . OMEGA-3 FATTY ACIDS 1000 MG PO CAPS Oral Take 2 g by mouth daily.      Marland Kitchen ONE-DAILY MULTI VITAMINS PO TABS Oral Take 1 tablet by mouth daily.    Marland Kitchen PYRIDOXINE HCL 100 MG PO TABS Oral Take 100 mg by mouth daily.      Marland Kitchen SIMVASTATIN 40 MG PO TABS Oral Take 1 tablet (40 mg total) by mouth every evening. 90 tablet 3  . CINNAMON 500 MG PO CAPS Oral Take 500 mg by mouth daily.           Objective:   Physical Exam General -- alert, well-developed, and overweight appearing.   Neck --no thyromegaly , normal carotid pulse  Breasts-- No mass, nodules, thickening, tenderness, bulging, retraction, inflamation, nipple discharge or skin changes noted.  no axillary lymph nodes Lungs -- normal respiratory effort, no intercostal retractions, no accessory muscle use, and normal breath sounds.   Heart-- normal rate, regular rhythm, no murmur, and no gallop.   Abdomen--soft, non-tender, no distention, no masses, no HSM, no guarding, and  no rigidity.   Extremities-- no pretibial edema bilaterally Neurologic-- alert & oriented X3 and strength normal in all extremities. Psych-- Cognition and judgment appear intact. Alert and cooperative with normal attention span and concentration.  not anxious appearing and not depressed appearing.       Assessment & Plan:

## 2011-11-25 NOTE — Assessment & Plan Note (Signed)
Contemplating L TKR, on tylenol prn

## 2011-11-25 NOTE — Assessment & Plan Note (Addendum)
TD around 2005 per pt, pneumonia shot around 2002 and 2008, shingles shot-- 12-2009 had a normal  colonoscopy at [facility] 2003 , Dr Randa Evens ----> iFOB neg 2010, 2011,12-2010. Discussed  repeat Cscope, we agreed to refer to GI (Dr Arlyce Dice who sees her husband) s/p hysterectomy for fibroids, no h/o abnormal PAPs had a neg PAP 2010, at the time there was a ? of  neovascularization , she saw Dr  Rana Snare ----> dx w/ athrophic vaginitis, she is low risk, no further PAPs MMG1-2013 neg  rec to continue w/  SBE  +FH CAD, pt asx, on statins an ASA counseled: diet-exercise

## 2011-11-30 ENCOUNTER — Encounter: Payer: Self-pay | Admitting: *Deleted

## 2011-12-23 ENCOUNTER — Telehealth: Payer: Self-pay | Admitting: Internal Medicine

## 2011-12-23 NOTE — Telephone Encounter (Signed)
Got a request for surgical clearance, scheduled to have a left knee arthroplasty. The patient is 61, was recently seen for a complete physical, denied chest pain or shortness of breath. She has well-controlled chronic medical problems. Based on these data, I will clear her for surgery, she will need to EKG before procedure. Advise patient, she is clear for surgery, fax clearance note. If she has any problems please schedule an appointment.

## 2011-12-26 ENCOUNTER — Encounter: Payer: Self-pay | Admitting: Internal Medicine

## 2011-12-26 NOTE — Telephone Encounter (Signed)
Discussed with pt & faxed to [facility]ainer.

## 2012-01-02 ENCOUNTER — Encounter (HOSPITAL_COMMUNITY): Payer: Self-pay | Admitting: Pharmacy Technician

## 2012-01-03 ENCOUNTER — Telehealth: Payer: Self-pay | Admitting: Internal Medicine

## 2012-01-03 NOTE — Telephone Encounter (Signed)
In reference to the Gastroenterology referral entered on 11/25/11, I have attempted to reach the patient & left multiple messages.  I have also mailed her a reminder letter, and the patient will not respond.

## 2012-01-03 NOTE — Telephone Encounter (Signed)
Noted, thank you

## 2012-01-10 ENCOUNTER — Encounter (HOSPITAL_COMMUNITY)
Admission: RE | Admit: 2012-01-10 | Discharge: 2012-01-10 | Disposition: A | Discharge status: Home / Self Care | Payer: Medicare Other | Source: Ambulatory Visit | Attending: Orthopedic Surgery | Admitting: Orthopedic Surgery

## 2012-01-10 ENCOUNTER — Encounter (HOSPITAL_COMMUNITY): Payer: Self-pay

## 2012-01-10 ENCOUNTER — Ambulatory Visit (HOSPITAL_COMMUNITY)
Admission: RE | Admit: 2012-01-10 | Discharge: 2012-01-10 | Disposition: A | Discharge status: Home / Self Care | Payer: Medicare Other | Source: Ambulatory Visit | Attending: Orthopedic Surgery | Admitting: Orthopedic Surgery

## 2012-01-10 DIAGNOSIS — Z01810 Encounter for preprocedural cardiovascular examination: Secondary | ICD-10-CM | POA: Insufficient documentation

## 2012-01-10 DIAGNOSIS — Z01818 Encounter for other preprocedural examination: Secondary | ICD-10-CM | POA: Insufficient documentation

## 2012-01-10 DIAGNOSIS — Z01812 Encounter for preprocedural laboratory examination: Secondary | ICD-10-CM | POA: Insufficient documentation

## 2012-01-10 DIAGNOSIS — I10 Essential (primary) hypertension: Secondary | ICD-10-CM | POA: Insufficient documentation

## 2012-01-10 LAB — BASIC METABOLIC PANEL
BUN: 12 mg/dL (ref 6–23)
GFR calc Af Amer: 81 mL/min — ABNORMAL LOW (ref >90)
GFR calc non Af Amer: 70 mL/min — ABNORMAL LOW (ref >90)
Potassium: 3.9 mEq/L (ref 3.5–5.1)
Sodium: 141 mEq/L (ref 135–145)

## 2012-01-10 LAB — SURGICAL PCR SCREEN: Staphylococcus aureus: NEGATIVE

## 2012-01-10 LAB — TYPE AND SCREEN
ABO/RH(D): A NEG
Antibody Screen: NEGATIVE

## 2012-01-10 LAB — CBC
Hemoglobin: 13.4 g/dL (ref 12.0–15.0)
MCHC: 34.1 g/dL (ref 30.0–36.0)
RBC: 4.3 MIL/uL (ref 3.87–5.11)
WBC: 6.3 10*3/uL (ref 4.0–10.5)

## 2012-01-10 NOTE — Pre-Procedure Instructions (Signed)
[address] [facility]  01/10/2012   Your procedure is scheduled on:  Tuesday, 01/17/2012@7 :30AM  Report to [facility] at 5:30 AM.  Call this number if you have problems the morning of surgery: [phone]   Remember:   Do not eat food and drink fluid:After Midnight.     Take these medicines the morning of surgery with A SIP OF WATER: None   Do not wear jewelry, make-up or nail polish.  Do not wear lotions, powders, or perfumes. You may not wear deodorant.  Do not shave 48 hours prior to surgery. Men may shave face and neck.  Do not bring valuables to the hospital.  Contacts, dentures or bridgework may not be worn into surgery.  Leave suitcase in the car. After surgery it may be brought to your room.  For patients admitted to the hospital, checkout time is 11:00 AM the day of discharge.   Patients discharged the day of surgery will not be allowed to drive home.  Name and phone number of your driver: Geraldin Habermehl, husband  Special Instructions: Shower using CHG 2 nights before surgery and the night before surgery.  If you shower the day of surgery use CHG.  Use special wash - you have one bottle of CHG for all showers.  You should use approximately 1/3 of the bottle for each shower.   Please read over the following fact sheets that you were given: Pain Booklet, Coughing and Deep Breathing, Blood Transfusion Information and Surgical Site Infection Prevention

## 2012-01-10 NOTE — Progress Notes (Signed)
No orders in chart-  Dr. Brooke Bonito office notified(#[phone]).

## 2012-01-10 NOTE — Progress Notes (Signed)
Interviewed pt for preadmit.  PCP: Willow Ora, Adolph Pollack.    Became tearful at appointment-dog had just died-denies suicide.  Pt allowed to ventilate emotions.

## 2012-01-11 NOTE — Consult Note (Signed)
Anesthesia chart review: Patient is a 71 year old female posted for unicompartmental knee, left by Dr. Dion Saucier on 01/17/2012. History includes nonsmoker, obesity, hypertension, hyperlipidemia, arthritis, osteoporosis, multiple surgeries including rhinoplasty and prior neck and back surgeries.  PCP is Dr. Willow Ora, he cleared her for this procedure (see his 12/23/11 telephone note).  EKG on 01/10/2012 showed sinus rhythm with occasional PVC, cannot rule out anterior infarct, age undetermined. (The interpreting Cardiologist suspected V2-V3 lead reversal.)  She had similar V2-V3 findings on an EKG from 09/28/06 (see Muse) and 01/16/07 (scanned under EKG tab).  Chest x-ray from 01/10/2012 was stable showing degenerative changes of thoracic spine, metallic fixation plate of the cervical spine, and no active disease.  CBC and BMET noted.  Surgeon orders were not available at her PAT appointment, so any additional lab orders by the surgeon will need to be done on the day of surgery.  Shonna Chock, PA-C.

## 2012-01-12 ENCOUNTER — Other Ambulatory Visit: Payer: Self-pay | Admitting: Orthopedic Surgery

## 2012-01-16 MED ORDER — VANCOMYCIN HCL IN DEXTROSE 1-5 GM/200ML-% IV SOLN
1000.0000 mg | INTRAVENOUS | Status: AC
  Administered 2012-01-17: 1000 mg via INTRAVENOUS
  Filled 2012-01-16: qty 200

## 2012-01-17 ENCOUNTER — Encounter (HOSPITAL_COMMUNITY): Payer: Self-pay | Admitting: Vascular Surgery

## 2012-01-17 ENCOUNTER — Encounter (HOSPITAL_COMMUNITY)
Admission: RE | Disposition: A | Discharge status: Home Health | Payer: Self-pay | Source: Ambulatory Visit | Attending: Orthopedic Surgery

## 2012-01-17 ENCOUNTER — Inpatient Hospital Stay (HOSPITAL_COMMUNITY): Payer: Medicare Other

## 2012-01-17 ENCOUNTER — Inpatient Hospital Stay (HOSPITAL_COMMUNITY)
Admission: RE | Admit: 2012-01-17 | Discharge: 2012-01-18 | DRG: 470 | Disposition: A | Discharge status: Home Health | Payer: Medicare Other | Source: Ambulatory Visit | Attending: Orthopedic Surgery | Admitting: Orthopedic Surgery

## 2012-01-17 ENCOUNTER — Encounter (HOSPITAL_COMMUNITY): Payer: Self-pay | Admitting: Orthopedic Surgery

## 2012-01-17 ENCOUNTER — Ambulatory Visit (HOSPITAL_COMMUNITY): Payer: Medicare Other | Admitting: Vascular Surgery

## 2012-01-17 DIAGNOSIS — M1710 Unilateral primary osteoarthritis, unspecified knee: Principal | ICD-10-CM | POA: Diagnosis present

## 2012-01-17 DIAGNOSIS — Z880 Allergy status to penicillin: Secondary | ICD-10-CM

## 2012-01-17 DIAGNOSIS — Z7901 Long term (current) use of anticoagulants: Secondary | ICD-10-CM

## 2012-01-17 DIAGNOSIS — M1712 Unilateral primary osteoarthritis, left knee: Secondary | ICD-10-CM | POA: Diagnosis present

## 2012-01-17 DIAGNOSIS — E785 Hyperlipidemia, unspecified: Secondary | ICD-10-CM | POA: Diagnosis present

## 2012-01-17 DIAGNOSIS — I10 Essential (primary) hypertension: Secondary | ICD-10-CM | POA: Diagnosis present

## 2012-01-17 DIAGNOSIS — Z888 Allergy status to other drugs, medicaments and biological substances status: Secondary | ICD-10-CM

## 2012-01-17 DIAGNOSIS — Z79899 Other long term (current) drug therapy: Secondary | ICD-10-CM

## 2012-01-17 DIAGNOSIS — G8929 Other chronic pain: Secondary | ICD-10-CM | POA: Diagnosis present

## 2012-01-17 DIAGNOSIS — M810 Age-related osteoporosis without current pathological fracture: Secondary | ICD-10-CM | POA: Diagnosis present

## 2012-01-17 DIAGNOSIS — Z7982 Long term (current) use of aspirin: Secondary | ICD-10-CM

## 2012-01-17 HISTORY — DX: Adverse effect of unspecified anesthetic, initial encounter: T41.45XA

## 2012-01-17 HISTORY — PX: PARTIAL KNEE ARTHROPLASTY: SHX2174

## 2012-01-17 HISTORY — DX: Other complications of anesthesia, initial encounter: T88.59XA

## 2012-01-17 SURGERY — ARTHROPLASTY, KNEE, UNICOMPARTMENTAL
Anesthesia: General | Site: Knee | Laterality: Left | Wound class: Clean

## 2012-01-17 MED ORDER — PHENYLEPHRINE HCL 10 MG/ML IJ SOLN
INTRAMUSCULAR | Status: DC | PRN
  Administered 2012-01-17 (×6): 80 ug via INTRAVENOUS

## 2012-01-17 MED ORDER — SENNA-DOCUSATE SODIUM 8.6-50 MG PO TABS: 1.0000 | ORAL_TABLET | Freq: Every day | ORAL | Status: DC

## 2012-01-17 MED ORDER — PHENOL 1.4 % MT LIQD: 1.0000 | OROMUCOSAL | Status: DC | PRN

## 2012-01-17 MED ORDER — SIMVASTATIN 40 MG PO TABS
40.0000 mg | ORAL_TABLET | Freq: Every evening | ORAL | Status: DC
  Administered 2012-01-17: 40 mg via ORAL
  Filled 2012-01-17 (×2): qty 1

## 2012-01-17 MED ORDER — ALUM & MAG HYDROXIDE-SIMETH 200-200-20 MG/5ML PO SUSP: 30.0000 mL | ORAL | Status: DC | PRN

## 2012-01-17 MED ORDER — HYDROCODONE-ACETAMINOPHEN 10-325 MG PO TABS: 1.0000 | ORAL_TABLET | Freq: Four times a day (QID) | ORAL | Status: DC | PRN

## 2012-01-17 MED ORDER — PROMETHAZINE HCL 25 MG/ML IJ SOLN: 6.2500 mg | INTRAMUSCULAR | Status: DC | PRN

## 2012-01-17 MED ORDER — HYDROMORPHONE HCL PF 1 MG/ML IJ SOLN: 0.2500 mg | INTRAMUSCULAR | Status: DC | PRN

## 2012-01-17 MED ORDER — CALCIUM CARBONATE-VITAMIN D 500-200 MG-UNIT PO TABS
1.0000 | ORAL_TABLET | Freq: Every day | ORAL | Status: DC
  Administered 2012-01-18: 1 via ORAL
  Filled 2012-01-17 (×3): qty 1

## 2012-01-17 MED ORDER — DOCUSATE SODIUM 100 MG PO CAPS
100.0000 mg | ORAL_CAPSULE | Freq: Two times a day (BID) | ORAL | Status: DC
  Administered 2012-01-17 – 2012-01-18 (×3): 100 mg via ORAL
  Filled 2012-01-17 (×4): qty 1

## 2012-01-17 MED ORDER — METHOCARBAMOL 100 MG/ML IJ SOLN
500.0000 mg | Freq: Four times a day (QID) | INTRAVENOUS | Status: DC | PRN
  Filled 2012-01-17: qty 5

## 2012-01-17 MED ORDER — POTASSIUM CHLORIDE IN NACL 20-0.45 MEQ/L-% IV SOLN
INTRAVENOUS | Status: DC
  Administered 2012-01-17 – 2012-01-18 (×2): via INTRAVENOUS
  Filled 2012-01-17 (×5): qty 1000

## 2012-01-17 MED ORDER — MENTHOL 3 MG MT LOZG: 1.0000 | LOZENGE | OROMUCOSAL | Status: DC | PRN

## 2012-01-17 MED ORDER — POLYETHYLENE GLYCOL 3350 17 G PO PACK: 17.0000 g | PACK | Freq: Every day | ORAL | Status: DC | PRN

## 2012-01-17 MED ORDER — 0.9 % SODIUM CHLORIDE (POUR BTL) OPTIME
TOPICAL | Status: DC | PRN
  Administered 2012-01-17: 1000 mL

## 2012-01-17 MED ORDER — HYDROMORPHONE HCL PF 1 MG/ML IJ SOLN: 1.0000 mg | INTRAMUSCULAR | Status: DC | PRN

## 2012-01-17 MED ORDER — ADULT MULTIVITAMIN W/MINERALS CH
1.0000 | ORAL_TABLET | Freq: Every day | ORAL | Status: DC
  Administered 2012-01-17 – 2012-01-18 (×2): 1 via ORAL
  Filled 2012-01-17 (×2): qty 1

## 2012-01-17 MED ORDER — VITAMIN B-6 100 MG PO TABS
100.0000 mg | ORAL_TABLET | Freq: Two times a day (BID) | ORAL | Status: DC
  Administered 2012-01-17 – 2012-01-18 (×3): 100 mg via ORAL
  Filled 2012-01-17 (×4): qty 1

## 2012-01-17 MED ORDER — ENOXAPARIN SODIUM 30 MG/0.3ML ~~LOC~~ SOLN
30.0000 mg | Freq: Two times a day (BID) | SUBCUTANEOUS | Status: DC
  Filled 2012-01-17: qty 0.3

## 2012-01-17 MED ORDER — METOCLOPRAMIDE HCL 5 MG/ML IJ SOLN: 5.0000 mg | Freq: Three times a day (TID) | INTRAMUSCULAR | Status: DC | PRN

## 2012-01-17 MED ORDER — BUPIVACAINE-EPINEPHRINE PF 0.5-1:200000 % IJ SOLN
INTRAMUSCULAR | Status: DC | PRN
  Administered 2012-01-17: 150 mg

## 2012-01-17 MED ORDER — LIDOCAINE HCL (CARDIAC) 20 MG/ML IV SOLN: INTRAVENOUS | Status: DC | PRN

## 2012-01-17 MED ORDER — ASPIRIN 81 MG PO CHEW
81.0000 mg | CHEWABLE_TABLET | Freq: Every day | ORAL | Status: DC
  Administered 2012-01-17 – 2012-01-18 (×2): 81 mg via ORAL
  Filled 2012-01-17 (×3): qty 1

## 2012-01-17 MED ORDER — DIPHENHYDRAMINE HCL 12.5 MG/5ML PO ELIX: 12.5000 mg | ORAL_SOLUTION | ORAL | Status: DC | PRN

## 2012-01-17 MED ORDER — WARFARIN SODIUM 7.5 MG PO TABS
7.5000 mg | ORAL_TABLET | Freq: Once | ORAL | Status: AC
  Administered 2012-01-17: 7.5 mg via ORAL
  Filled 2012-01-17: qty 1

## 2012-01-17 MED ORDER — HYDROCODONE-ACETAMINOPHEN 7.5-325 MG PO TABS
1.0000 | ORAL_TABLET | Freq: Four times a day (QID) | ORAL | Status: DC
  Administered 2012-01-17 – 2012-01-18 (×2): 1 via ORAL
  Filled 2012-01-17 (×2): qty 1

## 2012-01-17 MED ORDER — DEXAMETHASONE SODIUM PHOSPHATE 4 MG/ML IJ SOLN
INTRAMUSCULAR | Status: DC | PRN
  Administered 2012-01-17: 10 mg

## 2012-01-17 MED ORDER — WARFARIN VIDEO
Freq: Once | Status: AC
  Administered 2012-01-17: 17:00:00

## 2012-01-17 MED ORDER — BUPIVACAINE HCL (PF) 0.25 % IJ SOLN
INTRAMUSCULAR | Status: AC
  Filled 2012-01-17: qty 30

## 2012-01-17 MED ORDER — WARFARIN SODIUM 5 MG PO TABS: 5.0000 mg | ORAL_TABLET | Freq: Every day | ORAL | Status: DC

## 2012-01-17 MED ORDER — ONE-DAILY MULTI VITAMINS PO TABS: 1.0000 | ORAL_TABLET | Freq: Every day | ORAL | Status: DC

## 2012-01-17 MED ORDER — PATIENT'S GUIDE TO USING COUMADIN BOOK
Freq: Once | Status: AC
  Administered 2012-01-17: 17:00:00
  Filled 2012-01-17: qty 1

## 2012-01-17 MED ORDER — MAGNESIUM CITRATE PO SOLN
1.0000 | Freq: Once | ORAL | Status: AC | PRN
  Filled 2012-01-17: qty 296

## 2012-01-17 MED ORDER — VITAMIN B-12 1000 MCG PO TABS
1000.0000 ug | ORAL_TABLET | Freq: Every day | ORAL | Status: DC
  Administered 2012-01-17 – 2012-01-18 (×2): 1000 ug via ORAL
  Filled 2012-01-17 (×2): qty 1

## 2012-01-17 MED ORDER — VANCOMYCIN HCL IN DEXTROSE 1-5 GM/200ML-% IV SOLN
1000.0000 mg | Freq: Two times a day (BID) | INTRAVENOUS | Status: AC
  Administered 2012-01-17: 1000 mg via INTRAVENOUS
  Filled 2012-01-17: qty 200

## 2012-01-17 MED ORDER — ACETAMINOPHEN 10 MG/ML IV SOLN
1000.0000 mg | Freq: Four times a day (QID) | INTRAVENOUS | Status: AC
  Administered 2012-01-17 – 2012-01-18 (×3): 1000 mg via INTRAVENOUS
  Filled 2012-01-17 (×5): qty 100

## 2012-01-17 MED ORDER — ASPIRIN 81 MG PO TABS: 81.0000 mg | ORAL_TABLET | Freq: Every day | ORAL | Status: DC

## 2012-01-17 MED ORDER — LACTATED RINGERS IV SOLN
INTRAVENOUS | Status: DC | PRN
  Administered 2012-01-17 (×2): via INTRAVENOUS

## 2012-01-17 MED ORDER — WARFARIN - PHARMACIST DOSING INPATIENT: Freq: Every day | Status: DC

## 2012-01-17 MED ORDER — SODIUM CHLORIDE 0.9 % IR SOLN
Status: DC | PRN
  Administered 2012-01-17: 1

## 2012-01-17 MED ORDER — METHOCARBAMOL 500 MG PO TABS: 500.0000 mg | ORAL_TABLET | Freq: Four times a day (QID) | ORAL | Status: DC | PRN

## 2012-01-17 MED ORDER — BACLOFEN 10 MG PO TABS: 10.0000 mg | ORAL_TABLET | Freq: Three times a day (TID) | ORAL | Status: DC

## 2012-01-17 MED ORDER — KETOROLAC TROMETHAMINE 15 MG/ML IJ SOLN
7.5000 mg | Freq: Four times a day (QID) | INTRAMUSCULAR | Status: AC
  Administered 2012-01-17 – 2012-01-18 (×4): 7.5 mg via INTRAVENOUS
  Filled 2012-01-17 (×4): qty 1

## 2012-01-17 MED ORDER — ONDANSETRON HCL 4 MG PO TABS: 4.0000 mg | ORAL_TABLET | Freq: Four times a day (QID) | ORAL | Status: DC | PRN

## 2012-01-17 MED ORDER — PROPOFOL 10 MG/ML IV BOLUS
INTRAVENOUS | Status: DC | PRN
  Administered 2012-01-17: 150 mg via INTRAVENOUS

## 2012-01-17 MED ORDER — MIDAZOLAM HCL 5 MG/5ML IJ SOLN
INTRAMUSCULAR | Status: DC | PRN
  Administered 2012-01-17: 2 mg via INTRAVENOUS

## 2012-01-17 MED ORDER — SENNA 8.6 MG PO TABS
1.0000 | ORAL_TABLET | Freq: Two times a day (BID) | ORAL | Status: DC
  Administered 2012-01-17 – 2012-01-18 (×3): 8.6 mg via ORAL
  Filled 2012-01-17 (×4): qty 1

## 2012-01-17 MED ORDER — CALCIUM CARBONATE-VITAMIN D 250-125 MG-UNIT PO TABS: 1.0000 | ORAL_TABLET | Freq: Two times a day (BID) | ORAL | Status: DC

## 2012-01-17 MED ORDER — METOCLOPRAMIDE HCL 10 MG PO TABS: 5.0000 mg | ORAL_TABLET | Freq: Three times a day (TID) | ORAL | Status: DC | PRN

## 2012-01-17 MED ORDER — ZOLPIDEM TARTRATE 5 MG PO TABS: 5.0000 mg | ORAL_TABLET | Freq: Every evening | ORAL | Status: DC | PRN

## 2012-01-17 MED ORDER — SORBITOL 70 % SOLN: 30.0000 mL | Freq: Every day | Status: DC | PRN

## 2012-01-17 MED ORDER — ONDANSETRON HCL 4 MG/2ML IJ SOLN: 4.0000 mg | Freq: Four times a day (QID) | INTRAMUSCULAR | Status: DC | PRN

## 2012-01-17 MED ORDER — FENTANYL CITRATE 0.05 MG/ML IJ SOLN
INTRAMUSCULAR | Status: DC | PRN
  Administered 2012-01-17 (×4): 25 ug via INTRAVENOUS
  Administered 2012-01-17: 50 ug via INTRAVENOUS

## 2012-01-17 MED ORDER — ONDANSETRON HCL 4 MG/2ML IJ SOLN
INTRAMUSCULAR | Status: DC | PRN
  Administered 2012-01-17: 4 mg via INTRAVENOUS

## 2012-01-17 MED ORDER — ACETAMINOPHEN 650 MG RE SUPP: 650.0000 mg | Freq: Four times a day (QID) | RECTAL | Status: DC | PRN

## 2012-01-17 MED ORDER — ACETAMINOPHEN 325 MG PO TABS: 650.0000 mg | ORAL_TABLET | Freq: Four times a day (QID) | ORAL | Status: DC | PRN

## 2012-01-17 SURGICAL SUPPLY — 65 items
APL SKNCLS STERI-STRIP NONHPOA (GAUZE/BANDAGES/DRESSINGS) ×1
BANDAGE ELASTIC 6 VELCRO ST LF (GAUZE/BANDAGES/DRESSINGS) ×2 IMPLANT
BANDAGE ESMARK 6X9 LF (GAUZE/BANDAGES/DRESSINGS) ×1 IMPLANT
BENZOIN TINCTURE PRP APPL 2/3 (GAUZE/BANDAGES/DRESSINGS) ×1 IMPLANT
BLADE SAW RECIP 87.9 MT (BLADE) IMPLANT
BNDG CMPR 9X6 STRL LF SNTH (GAUZE/BANDAGES/DRESSINGS) ×1
BNDG ESMARK 6X9 LF (GAUZE/BANDAGES/DRESSINGS) ×2
BOOTCOVER CLEANROOM LRG (PROTECTIVE WEAR) ×4 IMPLANT
BOWL SMART MIX CTS (DISPOSABLE) ×3 IMPLANT
CEMENT BONE DEPUY (Cement) ×4 IMPLANT
CLOTH BEACON ORANGE TIMEOUT ST (SAFETY) ×2 IMPLANT
COVER SURGICAL LIGHT HANDLE (MISCELLANEOUS) ×2 IMPLANT
CUFF TOURNIQUET SINGLE 34IN LL (TOURNIQUET CUFF) IMPLANT
DRAPE EXTREMITY T 121X128X90 (DRAPE) ×2 IMPLANT
DRAPE PROXIMA HALF (DRAPES) ×2 IMPLANT
DRAPE U-SHAPE 47X51 STRL (DRAPES) ×2 IMPLANT
DRSG PAD ABDOMINAL 8X10 ST (GAUZE/BANDAGES/DRESSINGS) ×2 IMPLANT
DURAPREP 26ML APPLICATOR (WOUND CARE) ×2 IMPLANT
ELECT CAUTERY BLADE 6.4 (BLADE) ×2 IMPLANT
ELECT REM PT RETURN 9FT ADLT (ELECTROSURGICAL) ×2
ELECTRODE REM PT RTRN 9FT ADLT (ELECTROSURGICAL) ×1 IMPLANT
EVACUATOR 1/8 PVC DRAIN (DRAIN) IMPLANT
FACESHIELD LNG OPTICON STERILE (SAFETY) ×2 IMPLANT
GLOVE BIOGEL M 7.0 STRL (GLOVE) ×2 IMPLANT
GLOVE BIOGEL PI IND STRL 7.5 (GLOVE) ×1 IMPLANT
GLOVE BIOGEL PI IND STRL 8 (GLOVE) ×1 IMPLANT
GLOVE BIOGEL PI INDICATOR 7.5 (GLOVE) ×1
GLOVE BIOGEL PI INDICATOR 8 (GLOVE) ×1
GLOVE ORTHO TXT STRL SZ7.5 (GLOVE) ×2 IMPLANT
GLOVE SURG ORTHO 8.0 STRL STRW (GLOVE) ×4 IMPLANT
GOWN PREVENTION PLUS XLARGE (GOWN DISPOSABLE) ×1 IMPLANT
GOWN PREVENTION PLUS XXLARGE (GOWN DISPOSABLE) ×1 IMPLANT
GOWN STRL NON-REIN LRG LVL3 (GOWN DISPOSABLE) IMPLANT
HANDPIECE INTERPULSE COAX TIP (DISPOSABLE) ×2
HOOD PEEL AWAY FACE SHEILD DIS (HOOD) ×6 IMPLANT
IMMOBILIZER KNEE 22 UNIV (SOFTGOODS) ×1 IMPLANT
KIT BASIN OR (CUSTOM PROCEDURE TRAY) ×2 IMPLANT
KIT ROOM TURNOVER OR (KITS) ×2 IMPLANT
KIT SAW BLADE (KITS) ×1 IMPLANT
MANIFOLD NEPTUNE II (INSTRUMENTS) ×2 IMPLANT
NS IRRIG 1000ML POUR BTL (IV SOLUTION) ×2 IMPLANT
PACK TOTAL JOINT (CUSTOM PROCEDURE TRAY) ×2 IMPLANT
PAD ARMBOARD 7.5X6 YLW CONV (MISCELLANEOUS) ×3 IMPLANT
PAD CAST 4YDX4 CTTN HI CHSV (CAST SUPPLIES) ×1 IMPLANT
PADDING CAST COTTON 4X4 STRL (CAST SUPPLIES) ×2
PADDING CAST COTTON 6X4 STRL (CAST SUPPLIES) ×2 IMPLANT
RUBBERBAND STERILE (MISCELLANEOUS) ×2 IMPLANT
SET HNDPC FAN SPRY TIP SCT (DISPOSABLE) ×1 IMPLANT
SPONGE GAUZE 4X4 12PLY (GAUZE/BANDAGES/DRESSINGS) ×2 IMPLANT
STAPLER VISISTAT 35W (STAPLE) ×2 IMPLANT
STRIP CLOSURE SKIN 1/2X4 (GAUZE/BANDAGES/DRESSINGS) ×3 IMPLANT
SUCTION FRAZIER TIP 10 FR DISP (SUCTIONS) ×2 IMPLANT
SUT MNCRL AB 4-0 PS2 18 (SUTURE) ×2 IMPLANT
SUT VIC AB 0 CT1 27 (SUTURE) ×2
SUT VIC AB 0 CT1 27XBRD ANBCTR (SUTURE) ×2 IMPLANT
SUT VIC AB 1 CT1 27 (SUTURE) ×2
SUT VIC AB 1 CT1 27XBRD ANBCTR (SUTURE) ×2 IMPLANT
SUT VIC AB 2-0 CT1 18 (SUTURE) IMPLANT
SUT VIC AB 2-0 CT1 27 (SUTURE) ×2
SUT VIC AB 2-0 CT1 TAPERPNT 27 (SUTURE) ×1 IMPLANT
SYR 30ML LL (SYRINGE) ×2 IMPLANT
TOWEL OR 17X24 6PK STRL BLUE (TOWEL DISPOSABLE) ×2 IMPLANT
TOWEL OR 17X26 10 PK STRL BLUE (TOWEL DISPOSABLE) ×2 IMPLANT
TRAY FOLEY CATH 14FR (SET/KITS/TRAYS/PACK) ×1 IMPLANT
WATER STERILE IRR 1000ML POUR (IV SOLUTION) ×5 IMPLANT

## 2012-01-17 NOTE — Transfer of Care (Signed)
Immediate Anesthesia Transfer of Care Note  Patient: Rebecca [name] [facility]  Procedure(s) Performed: Procedure(s) (LRB) with comments: UNICOMPARTMENTAL KNEE (Left)  Patient Location: PACU  Anesthesia Type:GA combined with regional for post-op pain  Level of Consciousness: awake, alert , oriented and patient cooperative  Airway & Oxygen Therapy: Patient Spontanous Breathing and Patient connected to nasal cannula oxygen  Post-op Assessment: Report given to PACU RN, Post -op Vital signs reviewed and stable and Patient moving all extremities  Post vital signs: Reviewed and stable  Complications: No apparent anesthesia complications

## 2012-01-17 NOTE — Anesthesia Procedure Notes (Addendum)
Anesthesia Regional Block:  Femoral nerve block  Pre-Anesthetic Checklist: ,, timeout performed, Correct Patient, Correct Site, Correct Laterality, Correct Procedure, Correct Position, site marked, Risks and benefits discussed,  Surgical consent,  Pre-op evaluation,  At surgeon's request and post-op pain management  Laterality: Right  Prep: chloraprep       Needles:  Injection technique: Single-shot  Needle Type: Echogenic Stimulator Needle     Needle Length: 5cm 5 cm Needle Gauge: 22 and 22 G    Additional Needles:  Procedures: ultrasound guided (picture in chart) and nerve stimulator Femoral nerve block  Nerve Stimulator or Paresthesia:  Response: quadraceps contraction, 0.45 mA,   Additional Responses:   Narrative:  Start time: 01/17/2012 7:10 AM End time: 01/17/2012 7:20 AM Injection made incrementally with aspirations every 5 mL.  Performed by: Personally  Anesthesiologist: Halford Decamp, MD  Additional Notes: Functioning IV was confirmed and monitors were applied.  A 50mm 22ga Arrow echogenic stimulator needle was used. Sterile prep and drape,hand hygiene and sterile gloves were used. Ultrasound guidance: relevant anatomy identified, needle position confirmed, local anesthetic spread visualized around nerve(s)., vascular puncture avoided.  Image printed for medical record. Negative aspiration and negative test dose prior to incremental administration of local anesthetic. The patient tolerated the procedure well.    Femoral nerve block Procedure Name: LMA Insertion Date/Time: 01/17/2012 7:33 AM Performed by: Jerilee Hoh Pre-anesthesia Checklist: Patient identified, Emergency Drugs available, Suction available and Patient being monitored Patient Re-evaluated:Patient Re-evaluated prior to inductionOxygen Delivery Method: Circle system utilized Preoxygenation: Pre-oxygenation with 100% oxygen Intubation Type: IV induction LMA: LMA inserted LMA Size: 4.0 Tube  type: Oral Number of attempts: 1 Placement Confirmation: positive ETCO2 and breath sounds checked- equal and bilateral Tube secured with: Tape Dental Injury: Teeth and Oropharynx as per pre-operative assessment

## 2012-01-17 NOTE — Anesthesia Preprocedure Evaluation (Addendum)
Anesthesia Evaluation  Patient identified by MRN, date of birth, ID band Patient awake    Reviewed: Allergy & Precautions, H&P , NPO status , Patient's Chart, lab work & pertinent test results, reviewed documented beta blocker date and time   History of Anesthesia Complications Negative for: history of anesthetic complications  Airway Mallampati: II TM Distance: >3 FB Neck ROM: Full    Dental  (+) Teeth Intact and Dental Advisory Given   Pulmonary neg pulmonary ROS,    Pulmonary exam normal       Cardiovascular hypertension,     Neuro/Psych negative neurological ROS  negative psych ROS   GI/Hepatic negative GI ROS, Neg liver ROS,   Endo/Other  Morbid obesity  Renal/GU negative Renal ROS     Musculoskeletal   Abdominal   Peds  Hematology   Anesthesia Other Findings   Reproductive/Obstetrics                          Anesthesia Physical Anesthesia Plan  ASA: III  Anesthesia Plan: General   Post-op Pain Management:    Induction: Intravenous  Airway Management Planned: LMA  Additional Equipment:   Intra-op Plan:   Post-operative Plan: Extubation in OR  Informed Consent: I have reviewed the patients History and Physical, chart, labs and discussed the procedure including the risks, benefits and alternatives for the proposed anesthesia with the patient or authorized representative who has indicated his/her understanding and acceptance.   Dental advisory given  Plan Discussed with: CRNA and Anesthesiologist  Anesthesia Plan Comments:        Anesthesia Quick Evaluation

## 2012-01-17 NOTE — Evaluation (Signed)
Physical Therapy Evaluation Patient Details Name: Rebecca [name] MRN: [id number] DOB: 12-31-1940 Today's Date: 01/17/2012 Time: 4696-2952 PT Time Calculation (min): 27 min  PT Assessment / Plan / Recommendation Clinical Impression  Pt is a 71 y/o female s/p R Uni-Knee replacement.  Pt will be followed by PT to progress mobility for d/c home with HHPT follow-up.  Pt is an excellent candidate for d/c to home on POD # 1    PT Assessment  Patient needs continued PT services    Follow Up Recommendations  Home health PT    Does the patient have the potential to tolerate intense rehabilitation      Barriers to Discharge None      Equipment Recommendations       Recommendations for Other Services     Frequency 7X/week    Precautions / Restrictions Precautions Precautions: Fall;Knee Precaution Booklet Issued: No Required Braces or Orthoses: Knee Immobilizer - Left Knee Immobilizer - Left: Discontinue post op day 2 Restrictions Weight Bearing Restrictions: Yes LLE Weight Bearing: Weight bearing as tolerated   Pertinent Vitals/Pain Pt reporting pain 4-6/10 in R knee with activity.  Pt medicated prior to session.       Mobility  Bed Mobility Bed Mobility: Supine to Sit;Sitting - Scoot to Edge of Bed Supine to Sit: 4: Min assist;HOB flat Sitting - Scoot to [place] of Bed: 4: Min assist Details for Bed Mobility Assistance: Assist for L LE.  Transfers Transfers: Stand to Sit;Sit to Stand Sit to Stand: 4: Min guard;With upper extremity assist;From bed Stand to Sit: 4: Min assist;To chair/3-in-1;With upper extremity assist Details for Transfer Assistance: Cues for hand placement and min assist for controlled descent to chair.   Ambulation/Gait Ambulation/Gait Assistance: 4: Min guard Ambulation Distance (Feet): 20 Feet Assistive device: Rolling walker Ambulation/Gait Assistance Details: Cues for sequencing, WBAT on R LE and use of RW.   Gait Pattern: Step-to pattern;Decreased  stride length;Decreased weight shift to right;Decreased hip/knee flexion - right (Pt wearing KI. ) Stairs: No Wheelchair Mobility Wheelchair Mobility: No    Shoulder Instructions     Exercises Total Joint Exercises Ankle Circles/Pumps: Both;10 reps;Supine;AROM Quad Sets: 5 reps;Right;Supine Heel Slides: Right;5 reps;Supine;AROM   PT Diagnosis: Difficulty walking;Acute pain;Generalized weakness  PT Problem List: Decreased strength;Decreased range of motion;Decreased mobility;Decreased knowledge of use of DME;Pain PT Treatment Interventions: Gait training;Stair training;DME instruction;Functional mobility training;Therapeutic activities;Therapeutic exercise;Patient/family education;Manual techniques   PT Goals Acute Rehab PT Goals PT Goal Formulation: With patient Time For Goal Achievement: 01/24/12 Potential to Achieve Goals: Good Pt will go Supine/Side to Sit: Independently PT Goal: Supine/Side to Sit - Progress: Goal set today Pt will go Sit to Supine/Side: Independently PT Goal: Sit to Supine/Side - Progress: Goal set today Pt will Transfer Bed to Chair/Chair to Bed: with modified independence PT Transfer Goal: Bed to Chair/Chair to Bed - Progress: Goal set today Pt will Ambulate: 51 - 150 feet;with modified independence;with rolling walker PT Goal: Ambulate - Progress: Goal set today Pt will Go Up / Down Stairs: Flight;with supervision;with least restrictive assistive device PT Goal: Up/Down Stairs - Progress: Goal set today Pt will Perform Home Exercise Program: Independently PT Goal: Perform Home Exercise Program - Progress: Goal set today  Visit Information  Last PT Received On: 01/17/12 Assistance Needed: +1    Subjective Data  Subjective: Agree to PT eval Patient Stated Goal: Walk without pain   Prior Functioning  Home Living Lives With: Spouse Available Help at Discharge: Family Type of Home: House  Home Access: Stairs to enter Entrance Stairs-Number of Steps:  2 Entrance Stairs-Rails: Can reach both;Left;Right Home Layout: Two level Alternate Level Stairs-Number of Steps: 14 Alternate Level Stairs-Rails: Right Bathroom Shower/Tub: Tub/shower unit;Curtain Bathroom Toilet: Standard Bathroom Accessibility: Yes How Accessible: Accessible via walker Home Adaptive Equipment: Bedside commode/3-in-1;Straight cane;Walker - rolling Prior Function Level of Independence: Independent Able to Take Stairs?: Yes Driving: Yes Vocation: Retired Musician: No difficulties    Cognition  Overall Cognitive Status: Appears within functional limits for tasks assessed/performed Arousal/Alertness: Awake/alert Orientation Level: Appears intact for tasks assessed Behavior During Session: [facility] for tasks performed    Extremity/Trunk Assessment Right Lower Extremity Assessment RLE ROM/Strength/Tone: Within functional levels;WFL for tasks assessed RLE Sensation: WFL - Light Touch;WFL - Proprioception RLE Coordination: WFL - gross motor Left Lower Extremity Assessment LLE ROM/Strength/Tone: Unable to fully assess;Due to pain LLE Sensation: WFL - Light Touch;WFL - Proprioception LLE Coordination: WFL - gross motor Trunk Assessment Trunk Assessment: Normal   Balance    End of Session PT - End of Session Equipment Utilized During Treatment: Gait belt;Right knee immobilizer Activity Tolerance: Patient tolerated treatment well Patient left: in chair;with call bell/phone within reach Nurse Communication: Mobility status  GP     Vander Kueker 01/17/2012, 5:30 PM  Sheera Illingworth L. Curtisha Bendix DPT [phone]

## 2012-01-17 NOTE — Anesthesia Postprocedure Evaluation (Signed)
Anesthesia Post Note  Patient: Rebecca [name] [facility]  Procedure(s) Performed: Procedure(s) (LRB): UNICOMPARTMENTAL KNEE (Left)  Anesthesia type: general  Patient location: PACU  Post pain: Pain level controlled  Post assessment: Patient's Cardiovascular Status Stable  Last Vitals:  Filed Vitals:   01/17/12 1152  BP: 156/66  Pulse: 71  Temp:   Resp: 16    Post vital signs: Reviewed and stable  Level of consciousness: sedated  Complications: No apparent anesthesia complications

## 2012-01-17 NOTE — Op Note (Signed)
01/17/2012  9:30 AM  PATIENT:  Rebecca [name]    PRE-OPERATIVE DIAGNOSIS:  END STAGE DEGENERATIVE ARTHRITIS, medial compartment left knee  POST-OPERATIVE DIAGNOSIS:  Same  PROCEDURE:  UNICOMPARTMENTAL KNEE replacement, left knee medial  SURGEON:  Eulas Post, MD  PHYSICIAN ASSISTANT: Janace Litten, OPA-C, present and scrubbed throughout the case, critical for completion in a timely fashion, and for retraction, instrumentation, and closure.  ANESTHESIA:   General  PREOPERATIVE INDICATIONS:  Rebecca [name] is a  71 y.o. female with a diagnosis of END STAGE DEGENERATIVE ARTHRITIS who failed conservative measures and elected for surgical management.    The risks benefits and alternatives were discussed with the patient preoperatively including but not limited to the risks of infection, bleeding, nerve injury, cardiopulmonary complications, blood clots, the need for revision surgery, among others, and the patient was willing to proceed.  OPERATIVE IMPLANTS: Biomet [place] mobile bearing medial compartment arthroplasty, size AA tibia, small femur, 4 mm polyethylene insert  OPERATIVE FINDINGS: Endstage grade 4 medial compartment osteoarthritis of the medial condyle with osteophyte formation and full-thickness chondral loss. No significant changes in the lateral or patellofemoral joint  OPERATIVE PROCEDURE: The patient was brought to the operating room placed in supine position. General anesthesia was administered. IV antibiotics were given in the form of vancomycin. The lower extremity was placed in the legholder and prepped and draped in usual sterile fashion.  Time out was performed.  The leg was elevated and exsanguinated and the tourniquet was inflated. Anteromedial incision was performed, and I took care to preserve the MCL. Parapatellar incision was carried out, and the osteophytes were excised, along with the medial meniscus and a small portion of the fat pad.  The extra  medullary tibial cutting jig was applied, using the spoon and the 4mm G-Clamp, and I took care to protect the anterior cruciate ligament insertion and the tibial spine. The medial collateral ligament was also protected, and I resected my proximal tibia, matching his anatomic slope.  The proximal tibial bony cut was removed in one piece, and I turned my attention to the femur.  The intramedullary femoral rod was placed using the drill, and then using the appropriate reference, I assembled the femoral jig, setting my posterior cutting block. I resected my posterior femur, and then measured my gap.   I then used the mill to match the extension gap to the flexion gap. The gaps were then measured again with the appropriate feeler gauges. Once I had balanced flexion and extension gaps, I then completed the preparation of the femur. Initially, I milled with the 0 spigot, then went with 2, and then went with a 3, in order to adequately balance the gaps.  I milled off the anterior aspect of the distal femur to prevent impingement. I also exposed the tibia, and selected the above-named component, and then used the cutting jig to prepare the keel slot on the tibia. I also used the awl to curette out the bone to complete the preparation of the keel. The back wall was intact.  I then placed trial components, and it was found to have excellent motion, and appropriate balance.  I then cemented the components into place, cementing the tibia first, removing all excess cement, and then cementing the femur.  All loose cement was removed. I trialed with a 4 once more, and was satisfied with the stability and had range of motion.  The real polyethylene insert was applied manually, and the knee was taken through functional  range of motion, and found to have excellent stability and restoration of joint motion, with excellent balance.  The wounds were irrigated copiously, and the parapatellar tissue closed with Vicryl,  followed by Vicryl for the subcutaneous tissue, with routine closure with Steri-Strips and sterile gauze.  The tourniquet was released, and the patient was awakened and extubated and returned to PACU in stable and satisfactory condition. There were no complications.

## 2012-01-17 NOTE — Preoperative (Signed)
Beta Blockers   Reason not to administer Beta Blockers:Not Applicable 

## 2012-01-17 NOTE — Progress Notes (Signed)
Utilization review completed. Liley Rake, RN, BSN. 

## 2012-01-17 NOTE — H&P (Signed)
PREOPERATIVE H&P  Chief Complaint: END STAGE DEGENERATIVE ARTHRITIS, left knee  HPI: Rebecca [name] is a 71 y.o. female who presents for preoperative history and physical with a diagnosis of END STAGE DEGENERATIVE ARTHRITIS, left knee. Symptoms are rated as moderate to severe, and have been worsening.  This is significantly impairing activities of daily living.  She has elected for surgical management. She has failed injections, bracing, activity modification, anti-inflammatories. Pain is primarily medial, minimal pain laterally. She is also had previous knee arthroscopy.  Past Medical History  Diagnosis Date  . Hyperlipidemia   . Arthritis   . Osteoporosis   . Chronic neck pain   . Hypertension     No longer takes med.  . Localized osteoarthritis of left knee, medial 01/17/2012   Past Surgical History  Procedure Date  . Cholecystectomy   . Abdominal hysterectomy   . Tonsillectomy   . Oophorectomy   . Shoulder surgery 2008    x 2/Dr. Leslee Home  . Back surgery 2009    Dr Wynetta Emery  . Neck surgery 05/2008    Dr Wynetta Emery  . Knee surgery 9-11    right,  scope; left knee  . Arm surgery     transposed ulner nerve 10/14/08 (dr. Amanda Pea)  . Carpal tunnell 2006    bilateral  . Rhinoplasty 1980   History   Social History  . Marital Status: Married    Spouse Name: N/A    Number of Children: 4  . Years of Education: N/A   Social History Main Topics  . Smoking status: Never Smoker   . Smokeless tobacco: Never Used  . Alcohol Use: 0.0 oz/week    0 Glasses of wine per week  . Drug Use: No  . Sexually Active: Not on file   Other Topics Concern  . Not on file   Social History Narrative   Husband has cancer    No family history on file. Allergies  Allergen Reactions  . Alendronate Sodium     REACTION: jaw pain  . Oxycodone-Acetaminophen Hives  . Penicillins Swelling and Rash   Prior to Admission medications   Medication Sig Start Date End Date Taking? Authorizing Provider    aspirin 81 MG tablet Take 81 mg by mouth daily.     Yes Historical Provider, MD  calcium-vitamin D (OSCAL WITH D) 250-125 MG-UNIT per tablet Take 1 tablet by mouth 2 (two) times daily.   Yes Historical Provider, MD  Cholecalciferol (D3-1000 PO) Take 1,000 Units by mouth daily.     Yes Historical Provider, MD  Cinnamon 500 MG capsule Take 500 mg by mouth daily.     Yes Historical Provider, MD  fish oil-omega-3 fatty acids 1000 MG capsule Take 1 g by mouth 2 (two) times daily.    Yes Historical Provider, MD  Multiple Vitamin (MULTIVITAMIN) tablet Take 1 tablet by mouth daily.   Yes Historical Provider, MD  pyridoxine (B-6) 100 MG tablet Take 100 mg by mouth 2 (two) times daily.    Yes Historical Provider, MD  simvastatin (ZOCOR) 40 MG tablet Take 1 tablet (40 mg total) by mouth every evening. 11/25/11 06/17/13 Yes Wanda Plump, MD  vitamin B-12 (CYANOCOBALAMIN) 1000 MCG tablet Take 1,000 mcg by mouth daily.   Yes Historical Provider, MD     Positive ROS: All other systems have been reviewed and were otherwise negative with the exception of those mentioned in the HPI and as above.  Physical Exam: General: Alert, no acute distress Cardiovascular:  No pedal edema Respiratory: No cyanosis, no use of accessory musculature GI: No organomegaly, abdomen is soft and non-tender Skin: No lesions in the area of chief complaint Neurologic: Sensation intact distally Psychiatric: Patient is competent for consent with normal mood and affect Lymphatic: No axillary or cervical lymphadenopathy  MUSCULOSKELETAL: Left knee has pseudo-laxity to valgus testing, with mild varus alignment. Positive pain along the medial joint line. Mild crepitance. No patellofemoral pain, negative grind, no pain laterally.   X-rays demonstrate varus collapse, with periarticular sclerosis and mild cystic formation with osteophyte formation and joint space narrowing on the medial side.   Assessment: END STAGE DEGENERATIVE ARTHRITIS,  left knee  Plan: Plan for Procedure(s): UNICOMPARTMENTAL KNEE replacement  The risks benefits and alternatives were discussed with the patient including but not limited to the risks of nonoperative treatment, versus surgical intervention including infection, bleeding, nerve injury,  blood clots, cardiopulmonary complications, morbidity, mortality, among others, and they were willing to proceed. We've also discussed the potential for revision surgery, incomplete relief of pain, the potential for total knee replacement, among others.  Nautia Lem P, MD Cell [phone] Pager [phone]  01/17/2012 7:10 AM

## 2012-01-17 NOTE — Progress Notes (Signed)
ANTICOAGULATION CONSULT NOTE - Initial Consult  Pharmacy Consult for warfarin Indication: VTE prophylaxis s/p left knee replacement   Allergies  Allergen Reactions  . Alendronate Sodium     REACTION: jaw pain  . Oxycodone-Acetaminophen Hives  . Penicillins Swelling and Rash    Patient Measurements: Weight: 205 lb 0.4 oz (93 kg)  Vital Signs: Temp: 98.4 F (36.9 C) (11/05 1240) Temp src: Oral (11/05 1240) BP: 138/50 mmHg (11/05 1240) Pulse Rate: 72  (11/05 1240)  Labs:  Rebecca [name] 01/17/12 0621  HGB --  HCT --  PLT --  APTT --  LABPROT 13.3  INR 1.02  HEPARINUNFRC --  CREATININE --  CKTOTAL --  CKMB --  TROPONINI --    The CrCl is unknown because both a height and weight (above a minimum accepted value) are required for this calculation.   Medical History: Past Medical History  Diagnosis Date  . Hyperlipidemia   . Arthritis   . Osteoporosis   . Chronic neck pain   . Hypertension     No longer takes med.  . Localized osteoarthritis of left knee, medial 01/17/2012   Assessment: Rebecca [name] is a 71 year old female s/p left knee replacement to begin warfarin per pharmacy. No bleeding noted. Baseline INR 1.02. Patient also receiving lovenox 30mg  q12h. CBC 10/29 was within normal limits.   Goal of Therapy:  INR 2-3 Monitor platelets by anticoagulation protocol: Yes   Plan:  Coumadin 7.5mg  x 1 dose today Daily PT/INR Coumadin book/video  Thank you,  Brett Fairy, PharmD 01/17/2012 1:15 PM

## 2012-01-18 ENCOUNTER — Encounter (HOSPITAL_COMMUNITY): Payer: Self-pay | Admitting: Orthopedic Surgery

## 2012-01-18 LAB — CBC
Hemoglobin: 11.8 g/dL — ABNORMAL LOW (ref 12.0–15.0)
MCH: 30.3 pg (ref 26.0–34.0)
Platelets: 107 10*3/uL — ABNORMAL LOW (ref 150–400)
RBC: 3.9 MIL/uL (ref 3.87–5.11)
WBC: 9.6 10*3/uL (ref 4.0–10.5)

## 2012-01-18 LAB — PROTIME-INR: Prothrombin Time: 14.6 seconds (ref 11.6–15.2)

## 2012-01-18 LAB — BASIC METABOLIC PANEL
CO2: 24 mEq/L (ref 19–32)
Chloride: 106 mEq/L (ref 96–112)
Glucose, Bld: 124 mg/dL — ABNORMAL HIGH (ref 70–99)
Potassium: 4.3 mEq/L (ref 3.5–5.1)
Sodium: 138 mEq/L (ref 135–145)

## 2012-01-18 MED ORDER — HYDROCODONE-ACETAMINOPHEN 7.5-325 MG PO TABS
1.0000 | ORAL_TABLET | Freq: Four times a day (QID) | ORAL | Status: DC
  Administered 2012-01-18: 1 via ORAL
  Filled 2012-01-18: qty 1

## 2012-01-18 NOTE — Progress Notes (Signed)
Physical Therapy Treatment Patient Details Name: Rebecca [name] MRN: [id number] DOB: 1941-01-08 Today's Date: 01/18/2012    01/18/12 0831  PT Visit Information  Last PT Received On 01/18/12  PT Time Calculation  PT Start Time 0831  PT Stop Time 0914  PT Time Calculation (min) 43 min  Subjective Data  Subjective Pt received supine in bed with report "If I do well with you I can go home today." Pt reports 1/10 L knee pain.  Precautions  Precautions Fall;Knee  Required Braces or Orthoses Knee Immobilizer - Left  Knee Immobilizer - Left Discontinue post op day 2  Restrictions  Weight Bearing Restrictions Yes  LLE Weight Bearing WBAT  Cognition  Overall Cognitive Status Appears within functional limits for tasks assessed/performed  Arousal/Alertness Awake/alert  Orientation Level Appears intact for tasks assessed  Behavior During Session [facility] for tasks performed  Bed Mobility  Bed Mobility Supine to Sit;Sitting - Scoot to Edge of Bed  Supine to Sit 4: Min assist;HOB flat  Sitting - Scoot to [place] of Bed 4: Min assist  Details for Bed Mobility Assistance (pt able to manage L LE with KI)  Transfers  Transfers Stand to Sit;Sit to Stand  Sit to Stand 4: Min guard;4: Min assist  Stand to Sit 4: Min guard  Details for Transfer Assistance (v/c's for hand placement and L LE management)  Ambulation/Gait  Ambulation/Gait Assistance 4: Min assist  Ambulation Distance (Feet) 75 Feet  Assistive device Rolling walker  Ambulation/Gait Assistance Details v/c's to contract quad to inhibit L knee buckling  Gait Pattern Step-to pattern;Decreased step length - left;Decreased stance time - left  Gait velocity slow  General Gait Details L knee unsteady in beginning with KI but then more stable at end of session  Stairs Yes  Stairs Assistance 4: Min assist  Stairs Assistance Details (indicate cue type and reason) completed 2 steps with bialt HR to mimic home entry with min guard and v/c's for  sequencing and to maintain L quad contraction. trialed unilateral handrail to mimic flight to 2nd floor pt with + L knee buckling on way down requiring modA to maintain balance. pt able to complete 2nd set without L knee buckling and increased UE Wbing via HHA and handrail.  Number of Stairs 4   Total Joint Exercises  Ankle Circles/Pumps Both;10 reps;Supine;AROM  Quad Sets AROM;Left;20 reps;Supine  Heel Slides AAROM;Left;10 reps;Supine  PT - End of Session  Equipment Utilized During Treatment Gait belt;Left knee immobilizer  Activity Tolerance Patient tolerated treatment well  Patient left in chair;with call bell/phone within reach  Nurse Communication Mobility status  PT - Assessment/Plan  Comments on Treatment Session Pt s/p L unicompartment knee presenting with L LE weakness and L knee pain requiring use of L KI and RW for safe ambulation. patient safe to return home with spouse. recommend to stay on 1st floor tonight until HHPT can come out tomorrow and practice flight to upstairs with patient. Patient otherwise with good home set up and DME allowing for safe d/c home.  PT Plan Discharge plan remains appropriate  PT Frequency 7X/week  Follow Up Recommendations Home health PT  Equipment Recommended None recommended by PT  Acute Rehab PT Goals  PT Goal: Supine/Side to Sit - Progress Progressing toward goal  PT Goal: Sit to Supine/Side - Progress Progressing toward goal  PT Transfer Goal: Bed to Chair/Chair to Bed - Progress Progressing toward goal  PT Goal: Ambulate - Progress Progressing toward goal  PT Goal: Up/Down  Stairs - Progress Progressing toward goal  PT Goal: Perform Home Exercise Program - Progress Progressing toward goal  PT General Charges  $$ ACUTE PT VISIT 1 Procedure  PT Treatments  $Gait Training 23-37 mins  $Therapeutic Exercise 8-22 mins     Lewis Shock, PT, DPT Pager #: [phone] Office #: [phone]

## 2012-01-18 NOTE — Discharge Summary (Signed)
Physician Discharge Summary  Patient ID: Rebecca [name] MRN: [id number] DOB/AGE: 07-24-1940 71 y.o.  Admit date: 01/17/2012 Discharge date: 01/18/2012  Admission Diagnoses:  Localized osteoarthritis of left knee  Discharge Diagnoses:  Principal Problem:  *Localized osteoarthritis of left knee, medial   Past Medical History  Diagnosis Date  . Hyperlipidemia   . Arthritis   . Osteoporosis   . Chronic neck pain   . Hypertension     No longer takes med.  . Localized osteoarthritis of left knee, medial 01/17/2012  . Complication of anesthesia     at one time my blood pressure dropped     Surgeries: Procedure(s): UNICOMPARTMENTAL KNEE on 01/17/2012   Consultants (if any):    Discharged Condition: Improved  Hospital Course: Rebecca [name] is an 71 y.o. female who was admitted 01/17/2012 with a diagnosis of Localized osteoarthritis of left knee and went to the operating room on 01/17/2012 and underwent the above named procedures.    She was given perioperative antibiotics:  Anti-infectives     Start     Dose/Rate Route Frequency Ordered Stop   01/17/12 1930   vancomycin (VANCOCIN) IVPB 1000 mg/200 mL premix        1,000 mg 200 mL/hr over 60 Minutes Intravenous Every 12 hours 01/17/12 1250 01/17/12 2201   01/17/12 0600   vancomycin (VANCOCIN) IVPB 1000 mg/200 mL premix        1,000 mg 200 mL/hr over 60 Minutes Intravenous 60 min pre-op 01/16/12 1420 01/17/12 0736        .  She was given sequential compression devices, early ambulation, and lovenox bridging to coumadin for DVT prophylaxis.  She benefited maximally from the hospital stay and there were no complications.    Recent vital signs:  Filed Vitals:   01/18/12 0540  BP: 135/58  Pulse: 76  Temp: 98 F (36.7 C)  Resp: 18    Recent laboratory studies:  Lab Results  Component Value Date   HGB 11.8* 01/18/2012   HGB 13.4 01/10/2012   HGB 12.9 11/25/2011   Lab Results  Component Value Date   WBC  9.6 01/18/2012   PLT 107* 01/18/2012   Lab Results  Component Value Date   INR 1.16 01/18/2012   Lab Results  Component Value Date   NA 138 01/18/2012   K 4.3 01/18/2012   CL 106 01/18/2012   CO2 24 01/18/2012   BUN 14 01/18/2012   CREATININE 0.83 01/18/2012   GLUCOSE 124* 01/18/2012    Discharge Medications:     Medication List     As of 01/18/2012  8:51 AM    TAKE these medications         aspirin 81 MG tablet   Take 81 mg by mouth daily.      baclofen 10 MG tablet   Commonly known as: LIORESAL   Take 1 tablet (10 mg total) by mouth 3 (three) times daily. As needed for muscle spasm      calcium-vitamin D 250-125 MG-UNIT per tablet   Commonly known as: OSCAL WITH D   Take 1 tablet by mouth 2 (two) times daily.      Cinnamon 500 MG capsule   Take 500 mg by mouth daily.      D3-1000 PO   Take 1,000 Units by mouth daily.      fish oil-omega-3 fatty acids 1000 MG capsule   Take 1 g by mouth 2 (two) times daily.      HYDROcodone-acetaminophen  10-325 MG per tablet   Commonly known as: NORCO   Take 1-2 tablets by mouth every 6 (six) hours as needed for pain.      multivitamin tablet   Take 1 tablet by mouth daily.      pyridoxine 100 MG tablet   Commonly known as: B-6   Take 100 mg by mouth 2 (two) times daily.      sennosides-docusate sodium 8.6-50 MG tablet   Commonly known as: SENOKOT-S   Take 1 tablet by mouth daily.      simvastatin 40 MG tablet   Commonly known as: ZOCOR   Take 1 tablet (40 mg total) by mouth every evening.      vitamin B-12 1000 MCG tablet   Commonly known as: CYANOCOBALAMIN   Take 1,000 mcg by mouth daily.      warfarin 5 MG tablet   Commonly known as: COUMADIN   Take 1 tablet (5 mg total) by mouth daily.        Diagnostic Studies: Dg Chest 2 View  01/10/2012  *RADIOLOGY REPORT*  Clinical Data: Preop  CHEST - 2 VIEW  Comparison: 05/23/2006  Findings: Cardiomediastinal silhouette is stable.  No acute infiltrate or pleural effusion.   No pulmonary edema.  Stable degenerative changes thoracic spine. Metallic fixation plate cervical spine.  IMPRESSION: No active disease.  No significant change.   Original Report Authenticated By: Natasha Mead, M.D.    Dg Knee Left Port  01/17/2012  *RADIOLOGY REPORT*  Clinical Data: Status post arthroplasty  PORTABLE LEFT KNEE - 1-2 VIEW  Comparison: 05/02/2011 MRI  Findings: Status post medial arthroplasty.  Joint effusion and joint air is not unexpected in the recently postoperative state. Appropriate alignment.  No periprosthetic fracture identified.  IMPRESSION: Postoperative changes of left knee medial hemiarthroplasty.   Original Report Authenticated By: Jearld Lesch, M.D.     Disposition:       Discharge Orders    Future Appointments: Provider: Department: Dept Phone: Center:   11/26/2012 8:30 AM Wanda Plump, MD [facility]  [place] [phone] [facility]     Future Orders Please Complete By Expires   Weight bearing as tolerated         Follow-up Information    Follow up with Tyronne Blann P, MD. In 2 weeks.   Contact information:   [address] [place] [address] [phone]           Signed: Eulas Post 01/18/2012, 8:51 AM

## 2012-01-18 NOTE — Progress Notes (Signed)
ANTICOAGULATION CONSULT NOTE - Follow Up Consult  Pharmacy Consult:  Coumadin Indication:  VTE prophylaxis s/p left TKR  Allergies  Allergen Reactions  . Alendronate Sodium     REACTION: jaw pain  . Oxycodone-Acetaminophen Hives  . Penicillins Swelling and Rash    Patient Measurements: Weight: 205 lb 0.4 oz (93 kg)  Vital Signs: Temp: 98 F (36.7 C) (11/06 0540) Temp src: Oral (11/06 0540) BP: 135/58 mmHg (11/06 0540) Pulse Rate: 76  (11/06 0540)  Labs:  Rebecca [name] 01/18/12 0540 01/17/12 0621  HGB 11.8* --  HCT 35.6* --  PLT 107* --  APTT -- --  LABPROT 14.6 13.3  INR 1.16 1.02  HEPARINUNFRC -- --  CREATININE 0.83 --  CKTOTAL -- --  CKMB -- --  TROPONINI -- --    The CrCl is unknown because both a height and weight (above a minimum accepted value) are required for this calculation.      Assessment: 81 YOF s/p left TKR on 01/17/12 to continue on Coumadin and Lovenox.  Patient's INR remains subtherapeutic as expected since Coumadin started yesterday.  Patient's renal function appropriate for Lovenox dosing.  No bleeding reported.  Noted discharge planning.   Goal of Therapy:  INR 2-3 Monitor platelets by anticoagulation protocol: Yes    Plan:  - Coumadin 5mg  PO today - Continue Lovenox until INR therapeutic - INR in AM if still here     [name] D. Laney Potash, PharmD, BCPS Pager:  [phone] 01/18/2012, 12:09 PM

## 2012-01-18 NOTE — Progress Notes (Signed)
CARE MANAGEMENT NOTE 01/18/2012  Patient:  Rebecca [name], Rebecca [name]   Account Number:  [id number]  Date Initiated:  01/18/2012  Documentation initiated by:  Vance Peper  Subjective/Objective Assessment:   71 yr old female s/p unicompartmental knee.     Action/Plan:   Patient preoperatively setup with [facility], no changes. DME has been delivered to her home.   Anticipated DC Date:  01/19/2012   Anticipated DC Plan:  HOME W HOME HEALTH SERVICES      DC Planning Services  CM consult      [facility] Choice  HOME HEALTH   Choice offered to / List presented to:          [facility] arranged  HH-2 PT      Status of service:  Completed, signed off Medicare Important Message given?   (If response is "NO", the following Medicare IM given date fields will be blank) Date Medicare IM given:   Date Additional Medicare IM given:    Discharge Disposition:  HOME W HOME HEALTH SERVICES  Per UR Regulation:    If discussed at Long Length of Stay Meetings, dates discussed:    Comments:

## 2012-01-18 NOTE — Progress Notes (Signed)
     Subjective:  Patient reports pain as mild. She wants to do therapy, and is considering going home.  Objective:   VITALS:   Filed Vitals:   01/18/12 0036 01/18/12 0044 01/18/12 0445 01/18/12 0540  BP: 107/45 110/60  135/58  Pulse: 61   76  Temp: 97.9 F (36.6 C)   98 F (36.7 C)  TempSrc: Oral   Oral  Resp: 16  18 18   Weight:      SpO2: 96%  97% 100%    Dressings are clean. EHL and FHL are firing.  LABS  Results for orders placed during the hospital encounter of 01/17/12 (from the past 24 hour(s))  PROTIME-INR     Status: Normal   Collection Time   01/18/12  5:40 AM      Component Value Range   Prothrombin Time 14.6  11.6 - 15.2 seconds   INR 1.16  0.00 - 1.49  CBC     Status: Abnormal   Collection Time   01/18/12  5:40 AM      Component Value Range   WBC 9.6  4.0 - 10.5 K/uL   RBC 3.90  3.87 - 5.11 MIL/uL   Hemoglobin 11.8 (*) 12.0 - 15.0 g/dL   HCT 16.1 (*) 09.6 - 04.5 %   MCV 91.3  78.0 - 100.0 fL   MCH 30.3  26.0 - 34.0 pg   MCHC 33.1  30.0 - 36.0 g/dL   RDW 40.9  81.1 - 91.4 %   Platelets 107 (*) 150 - 400 K/uL  BASIC METABOLIC PANEL     Status: Abnormal   Collection Time   01/18/12  5:40 AM      Component Value Range   Sodium 138  135 - 145 mEq/L   Potassium 4.3  3.5 - 5.1 mEq/L   Chloride 106  96 - 112 mEq/L   CO2 24  19 - 32 mEq/L   Glucose, Bld 124 (*) 70 - 99 mg/dL   BUN 14  6 - 23 mg/dL   Creatinine, Ser 7.82  0.50 - 1.10 mg/dL   Calcium 9.4  8.4 - 95.6 mg/dL   GFR calc non Af Amer 69 (*) >90 mL/min   GFR calc Af Amer 80 (*) >90 mL/min    Dg Knee Left Port  01/17/2012  *RADIOLOGY REPORT*  Clinical Data: Status post arthroplasty  PORTABLE LEFT KNEE - 1-2 VIEW  Comparison: 05/02/2011 MRI  Findings: Status post medial arthroplasty.  Joint effusion and joint air is not unexpected in the recently postoperative state. Appropriate alignment.  No periprosthetic fracture identified.  IMPRESSION: Postoperative changes of left knee medial  hemiarthroplasty.   Original Report Authenticated By: Jearld Lesch, M.D.     Assessment/Plan: 1 Day Post-Op   Principal Problem:  *Localized osteoarthritis of left knee, medial   Possible discharge today if passes therapy.  Kirke Breach P 01/18/2012, 8:50 AM   Teryl Lucy, MD Cell [phone] Pager [phone]

## 2012-01-18 NOTE — Progress Notes (Signed)
Physical Therapy Treatment Patient Details Name: Rebecca [name] MRN: [id number] DOB: 08-20-1940 Today's Date: 01/18/2012   01/18/12 1329  PT Visit Information  Last PT Received On 01/18/12  Assistance Needed +1  PT Time Calculation  PT Start Time 1329  PT Stop Time 1353  PT Time Calculation (min) 24 min  Subjective Data  Subjective Pt received up in chair with report "I believe I'm ready to go home."  Precautions  Precautions Fall;Knee  Required Braces or Orthoses Knee Immobilizer - Left  Knee Immobilizer - Left Discontinue post op day 2  Restrictions  Weight Bearing Restrictions Yes  LLE Weight Bearing WBAT  Cognition  Overall Cognitive Status Appears within functional limits for tasks assessed/performed  Arousal/Alertness Awake/alert  Orientation Level Appears intact for tasks assessed  Behavior During Session [facility] for tasks performed  Bed Mobility  Bed Mobility Not assessed (pt received up in chair)  Transfers  Sit to Stand 5: Supervision  Stand to Sit 5: Supervision  Ambulation/Gait  Ambulation/Gait Assistance 4: Min guard  Ambulation Distance (Feet) 150 Feet  Assistive device Rolling walker  Ambulation/Gait Assistance Details pt with 1 episode of L knee instability when pt lost focus of actively contracting L thigh  Gait Pattern Step-to pattern;Decreased step length - left;Decreased stance time - left  Gait velocity slow  Total Joint Exercises  Ankle Circles/Pumps Both;10 reps;Supine;AROM  [employer] reps;Seated (with LEs extended)  Heel Slides AROM;Left;20 reps;Seated  Long [employer] (attempted but unable)  PT - End of Session  Equipment Utilized During Treatment Gait belt;Left knee immobilizer  Activity Tolerance Patient tolerated treatment well  Patient left in chair;with call bell/phone within reach  Nurse Communication Mobility status (okay for d/c home)  PT - Assessment/Plan  Comments on Treatment Session Pt with improved transfer and ambulation  this date however remains to have L quad weakness and L knee instability requiring use of L KI to assist in prevention of L knee buckling. Spoke with SW re: setting up HHPT and spoke with Apolinar Junes regarding pt safe to d/c home today.  PT Plan Discharge plan remains appropriate  PT Frequency 7X/week  Follow Up Recommendations Home health PT;Supervision/Assistance - 24 hour  Equipment Recommended None recommended by PT  Acute Rehab PT Goals  PT Goal: Ambulate - Progress Progressing toward goal  PT Goal: Up/Down Stairs - Progress Progressing toward goal  PT Goal: Perform Home Exercise Program - Progress Progressing toward goal  PT General Charges  $$ ACUTE PT VISIT 1 Procedure  PT Treatments  $Gait Training 8-22 mins  $Therapeutic Exercise 8-22 mins    Lewis Shock, PT, DPT Pager #: [phone] Office #: [phone]

## 2012-01-18 NOTE — Evaluation (Signed)
Occupational Therapy Evaluation Patient Details Name: Rebecca [name] MRN: [id number] DOB: 09-Jan-1941 Today's Date: 01/18/2012 Time: 7829-5621 OT Time Calculation (min): 46 min  OT Assessment / Plan / Recommendation Clinical Impression  71 yo female s/p Lt UNICOMPARTMENTAL KNEE that could benefit from [facility] and ot to sign off acutely    OT Assessment  All further OT needs can be met in the next venue of care    Follow Up Recommendations  Home health OT    Barriers to Discharge      Equipment Recommendations  None recommended by OT    Recommendations for Other Services    Frequency       Precautions / Restrictions Precautions Precautions: Fall;Knee Precaution Booklet Issued: No Required Braces or Orthoses: Knee Immobilizer - Left Knee Immobilizer - Left: Discontinue post op day 2 Restrictions Weight Bearing Restrictions: Yes LLE Weight Bearing: Weight bearing as tolerated   Pertinent Vitals/Pain none    ADL  Eating/Feeding: Performed;Independent Where Assessed - Eating/Feeding: Chair Grooming: Performed;Wash/dry face;Teeth care;Independent Where Assessed - Grooming: Unsupported standing Upper Body Dressing: Performed;Independent Where Assessed - Upper Body Dressing: Unsupported standing Lower Body Dressing: Performed;Minimal assistance Where Assessed - Lower Body Dressing: Unsupported sit to stand Toilet Transfer: Simulated;Modified independent Toilet Transfer Method: Sit to Barista: Raised toilet seat with arms (or 3-in-1 over toilet) Equipment Used: Reacher;Knee Immobilizer Transfers/Ambulation Related to ADLs: Pt completed ambulation to restroom without any v/c needed. WFL with RW use ADL Comments: Pt completing lunch on arrival. Pt educated on KI don/ doff. Pt has husband (A) at d/c. Pt completed grooming at sink level. Pt completed dressing at EOB standing. Pt (A) with don Lt shoe/ sock.    OT Diagnosis:    OT Problem List:   OT  Treatment Interventions:     OT Goals    Visit Information  Last OT Received On: 01/18/12 Assistance Needed: +1    Subjective Data  Subjective: "Our son has cerebral palsy and I started waiting in the car while he was at events"- [facility] Patient Stated Goal: to go home today   Prior Functioning     Home Living Lives With: Spouse Available Help at Discharge: Family Type of Home: House Home Access: Stairs to enter Secretary/administrator of Steps: 2 Entrance Stairs-Rails: Can reach both;Left;Right Home Layout: Two level Alternate Level Stairs-Number of Steps: 14 Alternate Level Stairs-Rails: Right Bathroom Shower/Tub: Tub/shower unit;Curtain Bathroom Toilet: Standard Bathroom Accessibility: Yes How Accessible: Accessible via walker Home Adaptive Equipment: Bedside commode/3-in-1;Straight cane;Walker - rolling Prior Function Level of Independence: Independent Able to Take Stairs?: Yes Driving: Yes Vocation: Retired Musician: No difficulties Dominant Hand: Right         Vision/Perception     Cognition  Overall Cognitive Status: Appears within functional limits for tasks assessed/performed Arousal/Alertness: Awake/alert Orientation Level: Appears intact for tasks assessed Behavior During Session: [facility] for tasks performed    Extremity/Trunk Assessment Right Upper Extremity Assessment RUE ROM/Strength/Tone: Within functional levels Left Upper Extremity Assessment LUE ROM/Strength/Tone: Within functional levels Trunk Assessment Trunk Assessment: Normal     Mobility Bed Mobility Bed Mobility: Not assessed Transfers Transfers: Stand to Sit;Sit to Stand Sit to Stand: 6: Modified independent (Device/Increase time);With upper extremity assist;From chair/3-in-1 Stand to Sit: 6: Modified independent (Device/Increase time);With upper extremity assist;To chair/3-in-1 Details for Transfer Assistance: wfl       Shoulder Instructions     Exercise     Balance  End of Session OT - End of Session Activity Tolerance: Patient tolerated treatment well Patient left: in chair;with call bell/phone within reach Nurse Communication: Mobility status;Precautions  GO     Lucile Shutters 01/18/2012, 1:29 PM Pager: [phone]

## 2012-03-27 ENCOUNTER — Encounter: Payer: Self-pay | Admitting: Gastroenterology

## 2012-04-24 ENCOUNTER — Encounter: Payer: Self-pay | Admitting: Gastroenterology

## 2012-04-24 ENCOUNTER — Ambulatory Visit (AMBULATORY_SURGERY_CENTER): Payer: Medicare Other

## 2012-04-24 VITALS — Ht 60.0 in | Wt 200.0 lb

## 2012-04-24 DIAGNOSIS — Z1211 Encounter for screening for malignant neoplasm of colon: Secondary | ICD-10-CM

## 2012-04-24 MED ORDER — NA SULFATE-K SULFATE-MG SULF 17.5-3.13-1.6 GM/177ML PO SOLN: 1.0000 | Freq: Once | ORAL | Status: DC

## 2012-05-08 ENCOUNTER — Ambulatory Visit (AMBULATORY_SURGERY_CENTER): Payer: Medicare Other | Admitting: Gastroenterology

## 2012-05-08 ENCOUNTER — Encounter: Payer: Self-pay | Admitting: Gastroenterology

## 2012-05-08 VITALS — BP 148/55 | HR 52 | Temp 97.0°F | Resp 16 | Ht 60.0 in | Wt 200.0 lb

## 2012-05-08 DIAGNOSIS — D126 Benign neoplasm of colon, unspecified: Secondary | ICD-10-CM

## 2012-05-08 DIAGNOSIS — Z1211 Encounter for screening for malignant neoplasm of colon: Secondary | ICD-10-CM

## 2012-05-08 DIAGNOSIS — K5730 Diverticulosis of large intestine without perforation or abscess without bleeding: Secondary | ICD-10-CM

## 2012-05-08 MED ORDER — SODIUM CHLORIDE 0.9 % IV SOLN: 500.0000 mL | INTRAVENOUS | Status: DC

## 2012-05-08 NOTE — Patient Instructions (Addendum)
YOU HAD AN ENDOSCOPIC PROCEDURE TODAY AT THE [facility]: Refer to the procedure report that was given to you for any specific questions about what was found during the examination.  If the procedure report does not answer your questions, please call your gastroenterologist to clarify.  If you requested that your care partner not be given the details of your procedure findings, then the procedure report has been included in a sealed envelope for you to review at your convenience later.  YOU SHOULD EXPECT: Some feelings of bloating in the abdomen. Passage of more gas than usual.  Walking can help get rid of the air that was put into your GI tract during the procedure and reduce the bloating. If you had a lower endoscopy (such as a colonoscopy or flexible sigmoidoscopy) you may notice spotting of blood in your stool or on the toilet paper. If you underwent a bowel prep for your procedure, then you may not have a normal bowel movement for a few days.  DIET: Your first meal following the procedure should be a light meal and then it is ok to progress to your normal diet.  A half-sandwich or bowl of soup is an example of a good first meal.  Heavy or fried foods are harder to digest and may make you feel nauseous or bloated.  Likewise meals heavy in dairy and vegetables can cause extra gas to form and this can also increase the bloating.  Drink plenty of fluids but you should avoid alcoholic beverages for 24 hours.  ACTIVITY: Your care partner should take you home directly after the procedure.  You should plan to take it easy, moving slowly for the rest of the day.  You can resume normal activity the day after the procedure however you should NOT DRIVE or use heavy machinery for 24 hours (because of the sedation medicines used during the test).    SYMPTOMS TO REPORT IMMEDIATELY: A gastroenterologist can be reached at any hour.  During normal business hours, 8:30 AM to 5:00 PM Monday through Friday,  call [phone].  After hours and on weekends, please call the GI answering service at [phone] who will take a message and have the physician on call contact you.   Following lower endoscopy (colonoscopy or flexible sigmoidoscopy):  Excessive amounts of blood in the stool  Significant tenderness or worsening of abdominal pains  Swelling of the abdomen that is new, acute  Fever of 100F or higher  FOLLOW UP: If any biopsies were taken you will be contacted by phone or by letter within the next 1-3 weeks.  Call your gastroenterologist if you have not heard about the biopsies in 3 weeks.  Our staff will call the home number listed on your records the next business day following your procedure to check on you and address any questions or concerns that you may have at that time regarding the information given to you following your procedure. This is a courtesy call and so if there is no answer at the home number and we have not heard from you through the emergency physician on call, we will assume that you have returned to your regular daily activities without incident.  SIGNATURES/CONFIDENTIALITY: You and/or your care partner have signed paperwork which will be entered into your electronic medical record.  These signatures attest to the fact that that the information above on your After Visit Summary has been reviewed and is understood.  Full responsibility of the confidentiality of this   discharge information lies with you and/or your care-partner.   Thank-you for choosing us for your healthcare needs. 

## 2012-05-08 NOTE — Progress Notes (Signed)
Lidocaine-40mg IV prior to Propofol InductionPropofol given over incremental dosages 

## 2012-05-08 NOTE — Progress Notes (Signed)
August 2009 had lower lumbar surgery and she had a complication of anesthesia causing a excessively low blood pressure.  Maw

## 2012-05-08 NOTE — Op Note (Signed)
[facility] [address] [place], [address]   COLONOSCOPY PROCEDURE REPORT  PATIENT: Rebecca [name], Rebecca [name]  MR#: [id number] BIRTHDATE: 30-Mar-1940 , 71  yrs. old GENDER: Female ENDOSCOPIST: Louis Meckel, MD REFERRED BY: PROCEDURE DATE:  05/08/2012 PROCEDURE:   Colonoscopy, diagnostic ASA CLASS:   Class II INDICATIONS:average risk screening. MEDICATIONS: MAC sedation, administered by CRNA and Propofol (Diprivan) 220 mg IV  DESCRIPTION OF PROCEDURE:   After the risks benefits and alternatives of the procedure were thoroughly explained, informed consent was obtained.       The LB [id number]-[id number]  endoscope was introduced through the anus and advanced to the cecum, which was identified by both the appendix and ileocecal valve. No adverse events experienced.   The quality of the prep was Suprep good  The instrument was then slowly withdrawn as the colon was fully examined.      COLON FINDINGS: Mild diverticulosis was noted in the sigmoid colon. The colon mucosa was otherwise normal.  Retroflexed views revealed no abnormalities. The time to cecum=4 minutes 20 seconds. Withdrawal time=12 minutes 20 seconds.  The scope was withdrawn and the procedure completed. COMPLICATIONS: There were no complications.  ENDOSCOPIC IMPRESSION: 1.   Mild diverticulosis was noted in the sigmoid colon 2.   The colon mucosa was otherwise normal  RECOMMENDATIONS: Continue current colorectal screening recommendations for "routine risk" patients with a repeat colonoscopy in 10 years.   eSigned:  Louis Meckel, MD 05/08/2012 11:11 AM   cc: Willow Ora, MD and Bishop Limbo MD

## 2012-05-08 NOTE — Progress Notes (Signed)
Patient did not have preoperative order for IV antibiotic SSI prophylaxis. (G8918)  Patient did not experience any of the following events: a burn prior to discharge; a fall within the facility; wrong site/side/patient/procedure/implant event; or a hospital transfer or hospital admission upon discharge from the facility. (G8907)  

## 2012-05-09 ENCOUNTER — Telehealth: Payer: Self-pay

## 2012-05-09 NOTE — Telephone Encounter (Signed)
  Follow up Call-  Call back number 05/08/2012  Post procedure Call Back phone  # [phone] hm  Permission to leave phone message Yes     Patient questions:  Do you have a fever, pain , or abdominal swelling? no Pain Score  0 *  Have you tolerated food without any problems? yes  Have you been able to return to your normal activities? yes  Do you have any questions about your discharge instructions: Diet   no Medications  no Follow up visit  no  Do you have questions or concerns about your Care? no  Actions: * If pain score is 4 or above: No action needed, pain <4.

## 2012-08-13 ENCOUNTER — Encounter: Payer: Self-pay | Admitting: Internal Medicine

## 2012-10-24 ENCOUNTER — Encounter: Payer: Self-pay | Admitting: Gastroenterology

## 2012-11-22 ENCOUNTER — Ambulatory Visit (INDEPENDENT_AMBULATORY_CARE_PROVIDER_SITE_OTHER): Payer: Medicare Other | Admitting: Gastroenterology

## 2012-11-22 ENCOUNTER — Encounter: Payer: Self-pay | Admitting: Gastroenterology

## 2012-11-22 VITALS — BP 128/82 | HR 72 | Ht 59.45 in | Wt 195.2 lb

## 2012-11-22 DIAGNOSIS — R1310 Dysphagia, unspecified: Secondary | ICD-10-CM | POA: Insufficient documentation

## 2012-11-22 MED ORDER — OMEPRAZOLE 20 MG PO CPDR: 20.0000 mg | DELAYED_RELEASE_CAPSULE | Freq: Every day | ORAL | Status: DC

## 2012-11-22 NOTE — Assessment & Plan Note (Signed)
  New onset dysphagia solids is probably due to  an esophageal stricture.  Recommendations #1 begin omeprazole 20 mg daily #2 upper endoscopy with dilatation as indicated

## 2012-11-22 NOTE — Patient Instructions (Addendum)

## 2012-11-22 NOTE — Progress Notes (Signed)
History of Present Illness: Rebecca [name] 72 year old white female referred for evaluation of dysphagia.  Recently she has developed dysphagia to solids.  When this occurs she has some tightness in the chest and jaw.  She denies pyrosis, nausea or odynophagia.  Symptoms only occur when she eats certain solids such as meats and potatoes skins.  Recent colonoscopy demonstrated diverticulosis.    Past Medical History  Diagnosis Date  . Hyperlipidemia   . Arthritis   . Osteoporosis   . Chronic neck pain   . Hypertension     No longer takes med.  . Localized osteoarthritis of left knee, medial 01/17/2012  . Complication of anesthesia     at one time my blood pressure dropped    Past Surgical History  Procedure Laterality Date  . Cholecystectomy    . Abdominal hysterectomy    . Tonsillectomy    . Oophorectomy    . Shoulder surgery  2008    x 2/Dr. Leslee Home  . Back surgery  2009    Dr Wynetta Emery  . Neck surgery  05/2008    Dr Wynetta Emery  . Knee surgery  9-11    right,  scope; left knee  . Arm surgery      transposed ulner nerve 10/14/08 (dr. Amanda Pea)  . Carpal tunnell  2006    bilateral  . Rhinoplasty  1980  . Partial knee arthroplasty  01/17/2012    LEFT KNEE  . Partial knee arthroplasty  01/17/2012    Procedure: UNICOMPARTMENTAL KNEE;  Surgeon: Eulas Post, MD;  Location: [facility] OR;  Service: Orthopedics;  Laterality: Left;   family history is negative for Colon cancer. Current Outpatient Prescriptions  Medication Sig Dispense Refill  . aspirin 81 MG tablet Take 81 mg by mouth daily.        . calcium-vitamin D (OSCAL WITH D) 250-125 MG-UNIT per tablet Take 1 tablet by mouth 2 (two) times daily.      . Cholecalciferol (D3-1000 PO) Take 1,000 Units by mouth daily.        . fish oil-omega-3 fatty acids 1000 MG capsule Take 1 g by mouth 2 (two) times daily.       Marland Kitchen ibuprofen (ADVIL,MOTRIN) 200 MG tablet Take 200 mg by mouth every 6 (six) hours as needed for pain.      . Multiple Vitamin  (MULTIVITAMIN) tablet Take 1 tablet by mouth daily.      Marland Kitchen pyridoxine (B-6) 100 MG tablet Take 100 mg by mouth 2 (two) times daily.       . simvastatin (ZOCOR) 40 MG tablet Take 1 tablet (40 mg total) by mouth every evening.  90 tablet  3  . vitamin B-12 (CYANOCOBALAMIN) 1000 MCG tablet Take 1,000 mcg by mouth daily.       No current facility-administered medications for this visit.   Allergies as of 11/22/2012 - Review Complete 11/22/2012  Allergen Reaction Noted  . Alendronate sodium  05/17/2006  . Oxycodone-acetaminophen Hives   . Penicillins Swelling and Rash 05/17/2006    reports that she has never smoked. She has never used smokeless tobacco. She reports that  drinks alcohol. She reports that she does not use illicit drugs.     Review of Systems: Pertinent positive and negative review of systems were noted in the above HPI section. All other review of systems were otherwise negative.  Vital signs were reviewed in today's medical record Physical Exam: General: Well developed , well nourished, no acute distress Skin: anicteric Head: Normocephalic and  atraumatic Eyes:  sclerae anicteric, EOMI Ears: Normal auditory acuity Mouth: No deformity or lesions Neck: Supple, no masses or thyromegaly Lungs: Clear throughout to auscultation Heart: Regular rate and rhythm; no murmurs, rubs or bruits Abdomen: Soft, non tender and non distended. No masses, hepatosplenomegaly or hernias noted. Normal Bowel sounds Rectal:deferred Musculoskeletal: Symmetrical with no gross deformities  Skin: No lesions on visible extremities Pulses:  Normal pulses noted Extremities: No clubbing, cyanosis, edema or deformities noted Neurological: Alert oriented x 4, grossly nonfocal Cervical Nodes:  No significant cervical adenopathy Inguinal Nodes: No significant inguinal adenopathy Psychological:  Alert and cooperative. Normal mood and affect

## 2012-11-23 ENCOUNTER — Encounter: Payer: Self-pay | Admitting: Gastroenterology

## 2012-11-26 ENCOUNTER — Encounter: Payer: Medicare Other | Admitting: Internal Medicine

## 2012-12-06 ENCOUNTER — Telehealth: Payer: Self-pay

## 2012-12-06 NOTE — Telephone Encounter (Signed)
Pt stated she is scheduled for an egd Monday with Dr. Arlyce Dice and noticed that the emmi video indicated a pt should advise provider if they take an antibiotic before procedures.  I spoke with Darcey Nora who clarified that this was no longer indicated and not required by [place].  I relayed this to the patient who acknowledged and understood.

## 2012-12-10 ENCOUNTER — Encounter: Payer: Self-pay | Admitting: Gastroenterology

## 2012-12-10 ENCOUNTER — Ambulatory Visit (AMBULATORY_SURGERY_CENTER): Payer: Medicare Other | Admitting: Gastroenterology

## 2012-12-10 VITALS — BP 127/58 | HR 58 | Temp 96.1°F | Resp 18 | Ht 59.0 in | Wt 195.0 lb

## 2012-12-10 DIAGNOSIS — R1310 Dysphagia, unspecified: Secondary | ICD-10-CM

## 2012-12-10 DIAGNOSIS — K222 Esophageal obstruction: Secondary | ICD-10-CM

## 2012-12-10 MED ORDER — SODIUM CHLORIDE 0.9 % IV SOLN: 500.0000 mL | INTRAVENOUS | Status: DC

## 2012-12-10 NOTE — Patient Instructions (Addendum)
YOU HAD AN ENDOSCOPIC PROCEDURE TODAY AT THE [facility]: Refer to the procedure report that was given to you for any specific questions about what was found during the examination.  If the procedure report does not answer your questions, please call your gastroenterologist to clarify.  If you requested that your care partner not be given the details of your procedure findings, then the procedure report has been included in a sealed envelope for you to review at your convenience later.  YOU SHOULD EXPECT: Some feelings of bloating in the abdomen. Passage of more gas than usual.  Walking can help get rid of the air that was put into your GI tract during the procedure and reduce the bloating. If you had a lower endoscopy (such as a colonoscopy or flexible sigmoidoscopy) you may notice spotting of blood in your stool or on the toilet paper. If you underwent a bowel prep for your procedure, then you may not have a normal bowel movement for a few days.  DIET: FOLLOW DILATION DIET.   ACTIVITY: Your care partner should take you home directly after the procedure.  You should plan to take it easy, moving slowly for the rest of the day.  You can resume normal activity the day after the procedure however you should NOT DRIVE or use heavy machinery for 24 hours (because of the sedation medicines used during the test).    SYMPTOMS TO REPORT IMMEDIATELY: A gastroenterologist can be reached at any hour.  During normal business hours, 8:30 AM to 5:00 PM Monday through Friday, call [phone].  After hours and on weekends, please call the GI answering service at [phone] who will take a message and have the physician on call contact you.    Following upper endoscopy (EGD)  Vomiting of blood or coffee ground material  New chest pain or pain under the shoulder blades  Painful or persistently difficult swallowing  New shortness of breath  Fever of 100F or higher  Black, tarry-looking  stools  FOLLOW UP: If any biopsies were taken you will be contacted by phone or by letter within the next 1-3 weeks.  Call your gastroenterologist if you have not heard about the biopsies in 3 weeks.  Our staff will call the home number listed on your records the next business day following your procedure to check on you and address any questions or concerns that you may have at that time regarding the information given to you following your procedure. This is a courtesy call and so if there is no answer at the home number and we have not heard from you through the emergency physician on call, we will assume that you have returned to your regular daily activities without incident.  SIGNATURES/CONFIDENTIALITY: You and/or your care partner have signed paperwork which will be entered into your electronic medical record.  These signatures attest to the fact that that the information above on your After Visit Summary has been reviewed and is understood.  Full responsibility of the confidentiality of this discharge information lies with you and/or your care-partner.  Resume medications. Dilation diet given with discharge instructions.

## 2012-12-10 NOTE — Progress Notes (Signed)
Patient did not experience any of the following events: a burn prior to discharge; a fall within the facility; wrong site/side/patient/procedure/implant event; or a hospital transfer or hospital admission upon discharge from the facility. (G8907) Patient did not have preoperative order for IV antibiotic SSI prophylaxis. (G8918)  

## 2012-12-10 NOTE — Progress Notes (Signed)
Called to room to assist during endoscopic procedure.  Patient ID and intended procedure confirmed with present staff. Received instructions for my participation in the procedure from the performing physician.  

## 2012-12-10 NOTE — Op Note (Signed)
 [facility] [address] [place], [address]   ENDOSCOPY PROCEDURE REPORT  PATIENT: Rebecca [name], Rebecca [name]  MR#: [id number] BIRTHDATE: May 23, 1940 , 72  yrs. old GENDER: Female ENDOSCOPIST: Louis Meckel, MD REFERRED BY:  Willow Ora, M.D. PROCEDURE DATE:  12/10/2012 PROCEDURE:  EGD, diagnostic and Maloney dilation of esophagus ASA CLASS:     Class II INDICATIONS:  Dysphagia. MEDICATIONS: MAC sedation, administered by CRNA, propofol (Diprivan) 100mg  IV, and Simethicone 0.6cc PO TOPICAL ANESTHETIC: Cetacaine Spray  DESCRIPTION OF PROCEDURE: After the risks benefits and alternatives of the procedure were thoroughly explained, informed consent was obtained.  The LB [id number] endoscope was introduced through the mouth and advanced to the third portion of the duodenum. Without limitations.  The instrument was slowly withdrawn as the mucosa was fully examined.        ESOPHAGUS: A moderately severe Schatzki ring was found at the gastroesophageal junction.  The remainder of the upper endoscopy exam was otherwise normal. The 9.8 mm gastroscope easily traversed the stricture. Retroflexed views revealed no abnormalities.     The scope was then withdrawn from the patient   A #52 Jerene Dilling dilator was passed with moderate resistance.  There was no heme.  COMPLICATIONS: There were no complications. ENDOSCOPIC IMPRESSION: 1.   Schatzki ring was found at the gastroesophageal junction - status post Maloney dilation 2.   The remainder of the upper endoscopy exam was otherwise normal  RECOMMENDATIONS: Dilatations PRN  REPEAT EXAM:  eSigned:  Louis Meckel, MD 12/10/2012 3:46 PM   CC:

## 2012-12-11 ENCOUNTER — Telehealth: Payer: Self-pay | Admitting: *Deleted

## 2012-12-11 NOTE — Telephone Encounter (Signed)
  Follow up Call-  Call back number 12/10/2012 05/08/2012  Post procedure Call Back phone  # [phone] hm  Permission to leave phone message Yes Yes     Patient questions:  Do you have a fever, pain , or abdominal swelling? no Pain Score  0 *  Have you tolerated food without any problems? yes  Have you been able to return to your normal activities? yes  Do you have any questions about your discharge instructions: Diet   no Medications  no Follow up visit  no  Do you have questions or concerns about your Care? no  Actions: * If pain score is 4 or above: No action needed, pain <4.

## 2012-12-18 ENCOUNTER — Telehealth: Payer: Self-pay

## 2012-12-18 NOTE — Telephone Encounter (Signed)
LM for CB  HM reviewed. UTD WE Due as noted: 2nd PNA if applicable Flu Vaccine

## 2012-12-19 ENCOUNTER — Ambulatory Visit (INDEPENDENT_AMBULATORY_CARE_PROVIDER_SITE_OTHER): Payer: Medicare Other | Admitting: Internal Medicine

## 2012-12-19 ENCOUNTER — Encounter: Payer: Self-pay | Admitting: Internal Medicine

## 2012-12-19 VITALS — BP 137/85 | HR 70 | Temp 98.5°F | Ht 60.5 in | Wt 198.2 lb

## 2012-12-19 DIAGNOSIS — M8580 Other specified disorders of bone density and structure, unspecified site: Secondary | ICD-10-CM

## 2012-12-19 DIAGNOSIS — R1310 Dysphagia, unspecified: Secondary | ICD-10-CM

## 2012-12-19 DIAGNOSIS — Z0000 Encounter for general adult medical examination without abnormal findings: Secondary | ICD-10-CM

## 2012-12-19 DIAGNOSIS — M1990 Unspecified osteoarthritis, unspecified site: Secondary | ICD-10-CM

## 2012-12-19 DIAGNOSIS — I10 Essential (primary) hypertension: Secondary | ICD-10-CM

## 2012-12-19 DIAGNOSIS — M899 Disorder of bone, unspecified: Secondary | ICD-10-CM

## 2012-12-19 DIAGNOSIS — Z23 Encounter for immunization: Secondary | ICD-10-CM

## 2012-12-19 DIAGNOSIS — E785 Hyperlipidemia, unspecified: Secondary | ICD-10-CM

## 2012-12-19 LAB — CBC WITH DIFFERENTIAL/PLATELET
Basophils Absolute: 0 10*3/uL (ref 0.0–0.1)
Eosinophils Relative: 2 % (ref 0.0–5.0)
HCT: 37.6 % (ref 36.0–46.0)
Lymphocytes Relative: 22 % (ref 12.0–46.0)
Monocytes Relative: 7.3 % (ref 3.0–12.0)
Neutrophils Relative %: 68.5 % (ref 43.0–77.0)
Platelets: 148 10*3/uL — ABNORMAL LOW (ref 150.0–400.0)
RDW: 13.4 % (ref 11.5–14.6)
WBC: 5.1 10*3/uL (ref 4.5–10.5)

## 2012-12-19 LAB — COMPREHENSIVE METABOLIC PANEL
ALT: 21 U/L (ref 0–35)
AST: 29 U/L (ref 0–37)
Alkaline Phosphatase: 47 U/L (ref 39–117)
BUN: 16 mg/dL (ref 6–23)
Calcium: 9.4 mg/dL (ref 8.4–10.5)
Chloride: 107 mEq/L (ref 96–112)
Creatinine, Ser: 1 mg/dL (ref 0.4–1.2)
Potassium: 3.6 mEq/L (ref 3.5–5.1)

## 2012-12-19 LAB — LIPID PANEL
HDL: 78.5 mg/dL (ref >39.00)
LDL Cholesterol: 64 mg/dL (ref 0–99)
Total CHOL/HDL Ratio: 2
Triglycerides: 73 mg/dL (ref 0.0–149.0)

## 2012-12-19 MED ORDER — SIMVASTATIN 40 MG PO TABS: 40.0000 mg | ORAL_TABLET | Freq: Every evening | ORAL | Status: DC

## 2012-12-19 NOTE — Telephone Encounter (Signed)
Unable to reach prior to visit  

## 2012-12-19 NOTE — Assessment & Plan Note (Signed)
Resolved after recent EGD with stretching, was recommended no chronic PPIs

## 2012-12-19 NOTE — Patient Instructions (Signed)

## 2012-12-19 NOTE — Progress Notes (Signed)
Subjective:    Patient ID: Rebecca [name], female    DOB: [birth year], 72 y.o.   MRN: [id number]  HPI Here for Medicare AWV:  1. Risk factors based on Past M, S, F history: yes   2. Physical Activities: take walks ,  remains active at home   3. Depression/mood: (-) screening 4. Hearing: denies problems, no problems noted w/ normal conversation 5.ADL's: totally independent   6. Fall Risk: prevention discussed   7. Home Safety: feels safe at home   8. Height, weight, &visual acuity: see VS, corrected w/  glasses , sees eye doctor regulalrly   9. Counseling: yes 10. Labs ordered based on risk factors: yes   11. Referral Coordination: if needed   12. Care Plan: see a/p   13. Cognitive Assessment: cognition, memory and alertness seemed fine  we also discussed the following: Hyperlipidemia, good compliance with medication. DJD, s/p knee replacement last year, doing great, rarely requires monitoring. Osteoporosis, good compliance with calcium, vitamin D and since then knee replacement she is able to take walks. Dysphagia, status post EGD, esophagus stretched, no further symptoms, was not recommended chronic PPIs.    Past Medical History  Diagnosis Date  . Hyperlipidemia   . Arthritis   . Osteoporosis   . Chronic neck pain   . Hypertension     No longer takes med.  . Complication of anesthesia     at one time my blood pressure dropped   . Dysphagia     esophageal stretched 9-14     Past Surgical History  Procedure Laterality Date  . Cholecystectomy    . Abdominal hysterectomy    . Tonsillectomy    . Oophorectomy    . Shoulder surgery  2008    x 2/Dr. Leslee Home  . Back surgery  2009    Dr Wynetta Emery  . Neck surgery  05/2008    Dr Wynetta Emery  . Knee surgery  9-11    right,  scope; left knee  . Arm surgery      transposed ulner nerve 10/14/08 (dr. Amanda Pea)  . Carpal tunnell  2006    bilateral  . Rhinoplasty  1980  . Partial knee arthroplasty  01/17/2012    LEFT KNEE  . Partial  knee arthroplasty  01/17/2012    Procedure: UNICOMPARTMENTAL KNEE;  Surgeon: Eulas Post, MD;  Location: [facility] OR;  Service: Orthopedics;  Laterality: Left;    History   Social History  . Marital Status: Married    Spouse Name: N/A    Number of Children: 4  . Years of Education: N/A   Occupational History  . stay home     Social History Main Topics  . Smoking status: Never Smoker   . Smokeless tobacco: Never Used  . Alcohol Use: 0.0 oz/week    0 Glasses of wine per week     Comment: rare  . Drug Use: No  . Sexual Activity: Not Currently   Other Topics Concern  . Not on file   Social History Narrative   Husband has cancer    Family History  Problem Relation Age of Onset  . Colon cancer Neg Hx   . Prostate cancer Father   . Diabetes Sister   . Heart disease Sister     sister and brother   . Kidney disease Sister   . Kidney disease Brother     Review of Systems No  CP, SOB, lower extremity edema Denies  nausea, vomiting diarrhea Denies  blood in the stools (-) cough, sputum production No dysuria, gross hematuria, difficulty urinating  No Vag d/c or bleeding        Objective:   Physical Exam BP 137/85  Pulse 70  Temp(Src) 98.5 F (36.9 C)  Ht 5' 0.5" (1.537 m)  Wt 198 lb 3.2 oz (89.903 kg)  BMI 38.06 kg/m2  SpO2 96% General -- alert, well-developed, NAD.  Neck --no thyromegaly   Breast-- no dominant mass, skin and nipples normal to inspection on palpation, axillary areas without mass or lymphadenopathy Lungs -- normal respiratory effort, no intercostal retractions, no accessory muscle use, and normal breath sounds.  Heart-- normal rate, regular rhythm, no murmur.  Abdomen-- Not distended, good bowel sounds,soft, non-tender.  Extremities-- no pretibial edema bilaterally  Neurologic--  alert & oriented X3. Speech normal, gait normal, strength normal in all extremities.  Psych-- Cognition and judgment appear intact. Cooperative with normal attention span  and concentration. No anxious appearing , no depressed appearing.     Assessment & Plan:

## 2012-12-19 NOTE — Assessment & Plan Note (Signed)
Doing well after knee replacement, rarely takes motrin

## 2012-12-19 NOTE — Assessment & Plan Note (Signed)
Compliance with meds, labs

## 2012-12-19 NOTE — Assessment & Plan Note (Signed)
Compliance with calcium and vitamin D, DEXA next year

## 2012-12-19 NOTE — Assessment & Plan Note (Addendum)
TD around 2005 per pt,  pneumonia shot around 2002 and 2008 shingles shot-- 12-2009 had a normal  colonoscopy at [facility] 2003, Cscope 2-14 nopolyps s/p hysterectomy for fibroids, no h/o abnormal PAPs, see previous notes, no further PAPs MM[facility] 07-2012 neg , neg breast exam today +FH CAD, pt asx, on statins an ASA counseled: diet-exercise

## 2012-12-19 NOTE — Assessment & Plan Note (Signed)
On no medications, not an issue at this point

## 2012-12-21 ENCOUNTER — Encounter: Payer: Self-pay | Admitting: *Deleted

## 2013-01-02 ENCOUNTER — Other Ambulatory Visit: Payer: Self-pay | Admitting: *Deleted

## 2013-01-02 MED ORDER — SIMVASTATIN 40 MG PO TABS: 40.0000 mg | ORAL_TABLET | Freq: Every evening | ORAL | Status: DC

## 2013-01-02 NOTE — Telephone Encounter (Signed)
Patient called and requested that her Simvastatin be sent to [facility]. Refills sent to [facility].

## 2013-06-10 ENCOUNTER — Other Ambulatory Visit: Payer: Self-pay | Admitting: *Deleted

## 2013-06-10 MED ORDER — SIMVASTATIN 40 MG PO TABS: 40.0000 mg | ORAL_TABLET | Freq: Every evening | ORAL | Status: DC

## 2013-08-07 LAB — HM MAMMOGRAPHY: HM Mammogram: NEGATIVE

## 2013-08-23 ENCOUNTER — Encounter: Payer: Self-pay | Admitting: Internal Medicine

## 2013-08-28 ENCOUNTER — Encounter: Payer: Self-pay | Admitting: Internal Medicine

## 2013-12-24 ENCOUNTER — Encounter: Payer: Medicare Other | Admitting: Internal Medicine

## 2014-01-02 ENCOUNTER — Other Ambulatory Visit: Payer: Self-pay | Admitting: Neurosurgery

## 2014-01-10 ENCOUNTER — Encounter (HOSPITAL_COMMUNITY): Payer: Self-pay | Admitting: Pharmacy Technician

## 2014-01-12 HISTORY — PX: BACK SURGERY: SHX140

## 2014-01-13 ENCOUNTER — Other Ambulatory Visit: Payer: Self-pay | Admitting: Neurosurgery

## 2014-01-13 DIAGNOSIS — M4310 Spondylolisthesis, site unspecified: Secondary | ICD-10-CM

## 2014-01-16 ENCOUNTER — Encounter (HOSPITAL_COMMUNITY)
Admission: RE | Admit: 2014-01-16 | Discharge: 2014-01-16 | Disposition: A | Discharge status: Home / Self Care | Payer: Medicare HMO | Source: Ambulatory Visit | Attending: Neurosurgery | Admitting: Neurosurgery

## 2014-01-16 ENCOUNTER — Encounter (HOSPITAL_COMMUNITY): Payer: Self-pay

## 2014-01-16 DIAGNOSIS — Z01812 Encounter for preprocedural laboratory examination: Secondary | ICD-10-CM | POA: Diagnosis present

## 2014-01-16 DIAGNOSIS — M4316 Spondylolisthesis, lumbar region: Secondary | ICD-10-CM | POA: Diagnosis not present

## 2014-01-16 HISTORY — DX: Personal history of other medical treatment: Z92.89

## 2014-01-16 LAB — BASIC METABOLIC PANEL
Anion gap: 13 (ref 5–15)
BUN: 10 mg/dL (ref 6–23)
CALCIUM: 10.2 mg/dL (ref 8.4–10.5)
CO2: 25 meq/L (ref 19–32)
Chloride: 102 mEq/L (ref 96–112)
Creatinine, Ser: 0.78 mg/dL (ref 0.50–1.10)
GFR calc Af Amer: >90 mL/min (ref >90)
GFR calc non Af Amer: 81 mL/min — ABNORMAL LOW (ref >90)
GLUCOSE: 99 mg/dL (ref 70–99)
Potassium: 4.1 mEq/L (ref 3.7–5.3)
Sodium: 140 mEq/L (ref 137–147)

## 2014-01-16 LAB — SURGICAL PCR SCREEN
MRSA, PCR: NEGATIVE
Staphylococcus aureus: NEGATIVE

## 2014-01-16 LAB — CBC
HEMATOCRIT: 38.8 % (ref 36.0–46.0)
Hemoglobin: 13.3 g/dL (ref 12.0–15.0)
MCH: 30.6 pg (ref 26.0–34.0)
MCHC: 34.3 g/dL (ref 30.0–36.0)
MCV: 89.2 fL (ref 78.0–100.0)
Platelets: 173 10*3/uL (ref 150–400)
RBC: 4.35 MIL/uL (ref 3.87–5.11)
RDW: 13.3 % (ref 11.5–15.5)
WBC: 4.6 10*3/uL (ref 4.0–10.5)

## 2014-01-16 LAB — TYPE AND SCREEN
ABO/RH(D): A NEG
Antibody Screen: NEGATIVE

## 2014-01-16 NOTE — Progress Notes (Signed)
Call to [facility], asked that Dr. Saintclair Halsted sign & release the orders.

## 2014-01-16 NOTE — Pre-Procedure Instructions (Signed)
Rebecca [name] [facility]  01/16/2014   Your procedure is scheduled on:  01/24/2014  Report to [facility] Admitting at 5:30 AM.  Call this number if you have problems the morning of surgery: [phone]   Remember:   Do not eat food or drink liquids after midnight.  On  Thursday    Take these medicines the morning of surgery with A SIP OF WATER: Tramadol (if needed)   Do not wear jewelry, make-up or nail polish.  Do not wear lotions, powders, or perfumes. You may wear deodorant.  Do not shave 48 hours prior to surgery.   Do not bring valuables to the hospital.  [facility] is not responsible                  for any belongings or valuables.               Contacts, dentures or bridgework may not be worn into surgery.  Leave suitcase in the car. After surgery it may be brought to your room.  For patients admitted to the hospital, discharge time is determined by your                treatment team.               Patients discharged the day of surgery will not be allowed to drive  home.  Name and phone number of your driver: /w spouse  Special Instructions: Special Instructions: [place] - Preparing for Surgery  Before surgery, you can play an important role.  Because skin is not sterile, your skin needs to be as free of germs as possible.  You can reduce the number of germs on you skin by washing with CHG (chlorahexidine gluconate) soap before surgery.  CHG is an antiseptic cleaner which kills germs and bonds with the skin to continue killing germs even after washing.  Please DO NOT use if you have an allergy to CHG or antibacterial soaps.  If your skin becomes reddened/irritated stop using the CHG and inform your nurse when you arrive at Short Stay.  Do not shave (including legs and underarms) for at least 48 hours prior to the first CHG shower.  You may shave your face.  Please follow these instructions carefully:   1.  Shower with CHG Soap the night before surgery and the   morning of Surgery.  2.  If you choose to wash your hair, wash your hair first as usual with your  normal shampoo.  3.  After you shampoo, rinse your hair and body thoroughly to remove the  Shampoo.  4.  Use CHG as you would any other liquid soap.  You can apply chg directly to the skin and wash gently with scrungie or a clean washcloth.  5.  Apply the CHG Soap to your body ONLY FROM THE NECK DOWN.    Do not use on open wounds or open sores.  Avoid contact with your eyes, ears, mouth and genitals (private parts).  Wash genitals (private parts)   with your normal soap.  6.  Wash thoroughly, paying special attention to the area where your surgery will be performed.  7.  Thoroughly rinse your body with warm water from the neck down.  8.  DO NOT shower/wash with your normal soap after using and rinsing off   the CHG Soap.  9.  Pat yourself dry with a clean towel.  10.  Wear clean pajamas.            11.  Place clean sheets on your bed the night of your first shower and do not sleep with pets.  Day of Surgery  Do not apply any lotions/deodorants the morning of surgery.  Please wear clean clothes to the hospital/surgery center.   Please read over the following fact sheets that you were given: Pain Booklet, Coughing and Deep Breathing, Blood Transfusion Information, MRSA Information and Surgical Site Infection Prevention

## 2014-01-16 NOTE — Progress Notes (Addendum)
PCP- Dr. Larose Kells- [place], pt. Reports that she has not ever had a cardiac consult, test. Pt. Reports that she was taking antiHTN med. Several yrs. Ago, but no longer taking.

## 2014-01-17 ENCOUNTER — Other Ambulatory Visit: Payer: Medicare Other

## 2014-01-21 ENCOUNTER — Ambulatory Visit
Admission: RE | Admit: 2014-01-21 | Discharge: 2014-01-21 | Disposition: A | Discharge status: Home / Self Care | Payer: Medicare HMO | Source: Ambulatory Visit | Attending: Neurosurgery | Admitting: Neurosurgery

## 2014-01-21 DIAGNOSIS — M4310 Spondylolisthesis, site unspecified: Secondary | ICD-10-CM

## 2014-01-24 ENCOUNTER — Inpatient Hospital Stay (HOSPITAL_COMMUNITY): Payer: Medicare HMO | Admitting: Certified Registered Nurse Anesthetist

## 2014-01-24 ENCOUNTER — Inpatient Hospital Stay (HOSPITAL_COMMUNITY): Payer: Medicare HMO

## 2014-01-24 ENCOUNTER — Inpatient Hospital Stay (HOSPITAL_COMMUNITY)
Admission: RE | Admit: 2014-01-24 | Discharge: 2014-01-27 | DRG: 460 | Disposition: A | Discharge status: Home / Self Care | Payer: Medicare HMO | Source: Ambulatory Visit | Attending: Neurosurgery | Admitting: Neurosurgery

## 2014-01-24 ENCOUNTER — Encounter (HOSPITAL_COMMUNITY)
Admission: RE | Disposition: A | Discharge status: Home / Self Care | Payer: Medicare Other | Source: Ambulatory Visit | Attending: Neurosurgery

## 2014-01-24 ENCOUNTER — Encounter (HOSPITAL_COMMUNITY): Payer: Self-pay | Admitting: Anesthesiology

## 2014-01-24 DIAGNOSIS — M4316 Spondylolisthesis, lumbar region: Secondary | ICD-10-CM | POA: Diagnosis present

## 2014-01-24 DIAGNOSIS — Z472 Encounter for removal of internal fixation device: Secondary | ICD-10-CM | POA: Diagnosis present

## 2014-01-24 DIAGNOSIS — Z96652 Presence of left artificial knee joint: Secondary | ICD-10-CM | POA: Diagnosis present

## 2014-01-24 DIAGNOSIS — M810 Age-related osteoporosis without current pathological fracture: Secondary | ICD-10-CM | POA: Diagnosis present

## 2014-01-24 DIAGNOSIS — M4806 Spinal stenosis, lumbar region: Principal | ICD-10-CM | POA: Diagnosis present

## 2014-01-24 DIAGNOSIS — E785 Hyperlipidemia, unspecified: Secondary | ICD-10-CM | POA: Diagnosis present

## 2014-01-24 DIAGNOSIS — Z7982 Long term (current) use of aspirin: Secondary | ICD-10-CM | POA: Diagnosis not present

## 2014-01-24 DIAGNOSIS — Z79899 Other long term (current) drug therapy: Secondary | ICD-10-CM | POA: Diagnosis not present

## 2014-01-24 DIAGNOSIS — M4186 Other forms of scoliosis, lumbar region: Secondary | ICD-10-CM | POA: Diagnosis present

## 2014-01-24 DIAGNOSIS — M4310 Spondylolisthesis, site unspecified: Secondary | ICD-10-CM

## 2014-01-24 DIAGNOSIS — M549 Dorsalgia, unspecified: Secondary | ICD-10-CM | POA: Diagnosis present

## 2014-01-24 DIAGNOSIS — M4800 Spinal stenosis, site unspecified: Secondary | ICD-10-CM | POA: Diagnosis present

## 2014-01-24 SURGERY — POSTERIOR LUMBAR FUSION 1 WITH HARDWARE REMOVAL
Anesthesia: General | Site: Spine Lumbar

## 2014-01-24 MED ORDER — CALCIUM CARBONATE-VITAMIN D 500-200 MG-UNIT PO TABS
1.0000 | ORAL_TABLET | Freq: Every day | ORAL | Status: DC
  Administered 2014-01-25 – 2014-01-27 (×3): 1 via ORAL
  Filled 2014-01-24 (×3): qty 1

## 2014-01-24 MED ORDER — ASPIRIN EC 81 MG PO TBEC
81.0000 mg | DELAYED_RELEASE_TABLET | Freq: Every day | ORAL | Status: DC
Start: 2014-01-25 — End: 2014-01-27
  Administered 2014-01-25 – 2014-01-27 (×3): 81 mg via ORAL
  Filled 2014-01-24 (×3): qty 1

## 2014-01-24 MED ORDER — ALBUMIN HUMAN 5 % IV SOLN
INTRAVENOUS | Status: DC | PRN
  Administered 2014-01-24 (×2): via INTRAVENOUS

## 2014-01-24 MED ORDER — ONDANSETRON HCL 4 MG/2ML IJ SOLN
INTRAMUSCULAR | Status: AC
  Filled 2014-01-24: qty 2

## 2014-01-24 MED ORDER — ASPIRIN 81 MG PO TABS: 81.0000 mg | ORAL_TABLET | Freq: Every day | ORAL | Status: DC

## 2014-01-24 MED ORDER — FENTANYL CITRATE 0.05 MG/ML IJ SOLN
INTRAMUSCULAR | Status: AC
  Filled 2014-01-24: qty 2

## 2014-01-24 MED ORDER — LACTATED RINGERS IV SOLN
INTRAVENOUS | Status: DC | PRN
  Administered 2014-01-24 (×3): via INTRAVENOUS

## 2014-01-24 MED ORDER — ACETAMINOPHEN 325 MG PO TABS
650.0000 mg | ORAL_TABLET | ORAL | Status: DC | PRN
Start: 2014-01-24 — End: 2014-01-27

## 2014-01-24 MED ORDER — VANCOMYCIN HCL IN DEXTROSE 1-5 GM/200ML-% IV SOLN
INTRAVENOUS | Status: AC
  Administered 2014-01-24: 1000 mg via INTRAVENOUS
  Filled 2014-01-24: qty 200

## 2014-01-24 MED ORDER — SODIUM CHLORIDE 0.9 % IV SOLN: 250.0000 mL | INTRAVENOUS | Status: DC

## 2014-01-24 MED ORDER — OCUVITE-LUTEIN PO CAPS
1.0000 | ORAL_CAPSULE | Freq: Every day | ORAL | Status: DC
  Administered 2014-01-26 – 2014-01-27 (×2): 1 via ORAL
  Filled 2014-01-24 (×3): qty 1

## 2014-01-24 MED ORDER — FENTANYL CITRATE 0.05 MG/ML IJ SOLN
INTRAMUSCULAR | Status: DC | PRN
  Administered 2014-01-24: 100 ug via INTRAVENOUS
  Administered 2014-01-24 (×3): 50 ug via INTRAVENOUS

## 2014-01-24 MED ORDER — ALUM & MAG HYDROXIDE-SIMETH 200-200-20 MG/5ML PO SUSP: 30.0000 mL | Freq: Four times a day (QID) | ORAL | Status: DC | PRN

## 2014-01-24 MED ORDER — GLYCOPYRROLATE 0.2 MG/ML IJ SOLN
INTRAMUSCULAR | Status: AC
Start: 2014-01-24 — End: 2014-01-24
  Filled 2014-01-24: qty 2

## 2014-01-24 MED ORDER — MENTHOL 3 MG MT LOZG: 1.0000 | LOZENGE | OROMUCOSAL | Status: DC | PRN

## 2014-01-24 MED ORDER — LIDOCAINE-EPINEPHRINE 1 %-1:100000 IJ SOLN
INTRAMUSCULAR | Status: DC | PRN
  Administered 2014-01-24: 10 mL

## 2014-01-24 MED ORDER — SODIUM CHLORIDE 0.9 % IR SOLN
Status: DC | PRN
  Administered 2014-01-24: 09:00:00

## 2014-01-24 MED ORDER — HYDROMORPHONE HCL 2 MG PO TABS
2.0000 mg | ORAL_TABLET | ORAL | Status: DC | PRN
  Administered 2014-01-24 – 2014-01-26 (×10): 2 mg via ORAL
  Filled 2014-01-24 (×10): qty 1

## 2014-01-24 MED ORDER — ACETAMINOPHEN 650 MG RE SUPP: 650.0000 mg | RECTAL | Status: DC | PRN

## 2014-01-24 MED ORDER — CEFAZOLIN SODIUM 1-5 GM-% IV SOLN: 1.0000 g | Freq: Three times a day (TID) | INTRAVENOUS | Status: DC

## 2014-01-24 MED ORDER — EPHEDRINE SULFATE 50 MG/ML IJ SOLN
INTRAMUSCULAR | Status: AC
  Filled 2014-01-24: qty 1

## 2014-01-24 MED ORDER — SODIUM CHLORIDE 0.9 % IV SOLN
INTRAVENOUS | Status: DC | PRN
  Administered 2014-01-24: 10:00:00 via INTRAVENOUS

## 2014-01-24 MED ORDER — ARTIFICIAL TEARS OP OINT
TOPICAL_OINTMENT | OPHTHALMIC | Status: AC
  Filled 2014-01-24: qty 3.5

## 2014-01-24 MED ORDER — TRAMADOL HCL 50 MG PO TABS
50.0000 mg | ORAL_TABLET | Freq: Four times a day (QID) | ORAL | Status: DC | PRN
  Administered 2014-01-24 – 2014-01-27 (×4): 50 mg via ORAL
  Filled 2014-01-24 (×4): qty 1

## 2014-01-24 MED ORDER — SODIUM CHLORIDE 0.9 % IJ SOLN
INTRAMUSCULAR | Status: AC
  Filled 2014-01-24: qty 10

## 2014-01-24 MED ORDER — CALCIUM CARBONATE-VITAMIN D 250-125 MG-UNIT PO TABS
1.0000 | ORAL_TABLET | Freq: Two times a day (BID) | ORAL | Status: DC
  Filled 2014-01-24 (×2): qty 1

## 2014-01-24 MED ORDER — VANCOMYCIN HCL IN DEXTROSE 1-5 GM/200ML-% IV SOLN
1000.0000 mg | Freq: Once | INTRAVENOUS | Status: AC
  Administered 2014-01-24: 1000 mg via INTRAVENOUS
  Filled 2014-01-24: qty 200

## 2014-01-24 MED ORDER — FENTANYL CITRATE 0.05 MG/ML IJ SOLN
25.0000 ug | INTRAMUSCULAR | Status: DC | PRN
  Administered 2014-01-24 (×3): 25 ug via INTRAVENOUS
  Administered 2014-01-24: 50 ug via INTRAVENOUS
  Administered 2014-01-24: 25 ug via INTRAVENOUS

## 2014-01-24 MED ORDER — PHENOL 1.4 % MT LIQD: 1.0000 | OROMUCOSAL | Status: DC | PRN

## 2014-01-24 MED ORDER — NEOSTIGMINE METHYLSULFATE 10 MG/10ML IV SOLN
INTRAVENOUS | Status: AC
  Filled 2014-01-24: qty 1

## 2014-01-24 MED ORDER — BUPIVACAINE HCL (PF) 0.25 % IJ SOLN
INTRAMUSCULAR | Status: DC | PRN
  Administered 2014-01-24: 20 mL

## 2014-01-24 MED ORDER — 0.9 % SODIUM CHLORIDE (POUR BTL) OPTIME
TOPICAL | Status: DC | PRN
  Administered 2014-01-24: 1000 mL

## 2014-01-24 MED ORDER — FENTANYL CITRATE 0.05 MG/ML IJ SOLN
INTRAMUSCULAR | Status: AC
  Filled 2014-01-24: qty 5

## 2014-01-24 MED ORDER — NEOSTIGMINE METHYLSULFATE 10 MG/10ML IV SOLN
INTRAVENOUS | Status: DC | PRN
  Administered 2014-01-24: 4 mg via INTRAVENOUS

## 2014-01-24 MED ORDER — ONDANSETRON HCL 4 MG/2ML IJ SOLN
4.0000 mg | INTRAMUSCULAR | Status: DC | PRN
  Administered 2014-01-24: 4 mg via INTRAVENOUS
  Filled 2014-01-24: qty 2

## 2014-01-24 MED ORDER — PROPOFOL 10 MG/ML IV BOLUS
INTRAVENOUS | Status: DC | PRN
  Administered 2014-01-24: 150 mg via INTRAVENOUS

## 2014-01-24 MED ORDER — OXYCODONE-ACETAMINOPHEN 5-325 MG PO TABS: 1.0000 | ORAL_TABLET | ORAL | Status: DC | PRN

## 2014-01-24 MED ORDER — DOCUSATE SODIUM 100 MG PO CAPS
100.0000 mg | ORAL_CAPSULE | Freq: Two times a day (BID) | ORAL | Status: DC
  Administered 2014-01-24 – 2014-01-27 (×6): 100 mg via ORAL
  Filled 2014-01-24 (×6): qty 1

## 2014-01-24 MED ORDER — ARTIFICIAL TEARS OP OINT
TOPICAL_OINTMENT | OPHTHALMIC | Status: DC | PRN
  Administered 2014-01-24: 1 via OPHTHALMIC

## 2014-01-24 MED ORDER — SODIUM CHLORIDE 0.9 % IJ SOLN: 3.0000 mL | INTRAMUSCULAR | Status: DC | PRN

## 2014-01-24 MED ORDER — THROMBIN 20000 UNITS EX SOLR
CUTANEOUS | Status: DC | PRN
  Administered 2014-01-24 (×2): via TOPICAL

## 2014-01-24 MED ORDER — PROPOFOL 10 MG/ML IV BOLUS
INTRAVENOUS | Status: AC
  Filled 2014-01-24: qty 20

## 2014-01-24 MED ORDER — HYDROMORPHONE HCL 1 MG/ML IJ SOLN
0.5000 mg | INTRAMUSCULAR | Status: DC | PRN
  Administered 2014-01-24: 1 mg via INTRAVENOUS

## 2014-01-24 MED ORDER — HYDROMORPHONE HCL 1 MG/ML IJ SOLN
INTRAMUSCULAR | Status: AC
  Filled 2014-01-24: qty 1

## 2014-01-24 MED ORDER — SODIUM CHLORIDE 0.9 % IJ SOLN
3.0000 mL | Freq: Two times a day (BID) | INTRAMUSCULAR | Status: DC
  Administered 2014-01-24 – 2014-01-26 (×5): 3 mL via INTRAVENOUS

## 2014-01-24 MED ORDER — EPHEDRINE SULFATE 50 MG/ML IJ SOLN
INTRAMUSCULAR | Status: DC | PRN
  Administered 2014-01-24: 10 mg via INTRAVENOUS

## 2014-01-24 MED ORDER — ROCURONIUM BROMIDE 50 MG/5ML IV SOLN
INTRAVENOUS | Status: AC
  Filled 2014-01-24: qty 1

## 2014-01-24 MED ORDER — ONDANSETRON HCL 4 MG/2ML IJ SOLN
INTRAMUSCULAR | Status: DC | PRN
  Administered 2014-01-24: 4 mg via INTRAVENOUS

## 2014-01-24 MED ORDER — ROCURONIUM BROMIDE 100 MG/10ML IV SOLN
INTRAVENOUS | Status: DC | PRN
  Administered 2014-01-24: 10 mg via INTRAVENOUS
  Administered 2014-01-24: 40 mg via INTRAVENOUS
  Administered 2014-01-24: 10 mg via INTRAVENOUS

## 2014-01-24 MED ORDER — GLYCOPYRROLATE 0.2 MG/ML IJ SOLN
INTRAMUSCULAR | Status: DC | PRN
  Administered 2014-01-24: 0.6 mg via INTRAVENOUS

## 2014-01-24 MED ORDER — PHENYLEPHRINE 40 MCG/ML (10ML) SYRINGE FOR IV PUSH (FOR BLOOD PRESSURE SUPPORT)
PREFILLED_SYRINGE | INTRAVENOUS | Status: AC
  Filled 2014-01-24: qty 10

## 2014-01-24 MED ORDER — SUCCINYLCHOLINE CHLORIDE 20 MG/ML IJ SOLN
INTRAMUSCULAR | Status: AC
  Filled 2014-01-24: qty 1

## 2014-01-24 MED ORDER — SIMVASTATIN 40 MG PO TABS
40.0000 mg | ORAL_TABLET | Freq: Every evening | ORAL | Status: DC
  Administered 2014-01-24 – 2014-01-26 (×3): 40 mg via ORAL
  Filled 2014-01-24 (×3): qty 1

## 2014-01-24 MED ORDER — LIDOCAINE HCL (CARDIAC) 20 MG/ML IV SOLN
INTRAVENOUS | Status: DC | PRN
  Administered 2014-01-24: 50 mg via INTRAVENOUS

## 2014-01-24 SURGICAL SUPPLY — 72 items
ALLOSTEM STRIP 20MMX50MM (Tissue) ×2 IMPLANT
APL SKNCLS STERI-STRIP NONHPOA (GAUZE/BANDAGES/DRESSINGS) ×1
BAG DECANTER FOR FLEXI CONT (MISCELLANEOUS) ×3 IMPLANT
BENZOIN TINCTURE PRP APPL 2/3 (GAUZE/BANDAGES/DRESSINGS) ×3 IMPLANT
BLADE CLIPPER SURG (BLADE) IMPLANT
BLADE SURG 11 STRL SS (BLADE) ×3 IMPLANT
BONE ALLOSTEM MORSELIZED 5CC (Bone Implant) ×2 IMPLANT
BRUSH SCRUB EZ PLAIN DRY (MISCELLANEOUS) ×3 IMPLANT
BUR MATCHSTICK NEURO 3.0 LAGG (BURR) ×3 IMPLANT
BUR PRECISION FLUTE 6.0 (BURR) ×3 IMPLANT
CAGE RISE 11-17-15 10X26 (Cage) ×4 IMPLANT
CANISTER SUCT 3000ML (MISCELLANEOUS) ×3 IMPLANT
CAP REVERE LOCKING (Cap) ×4 IMPLANT
CLOSURE WOUND 1/2 X4 (GAUZE/BANDAGES/DRESSINGS) ×2
CONT SPEC 4OZ CLIKSEAL STRL BL (MISCELLANEOUS) ×6 IMPLANT
COVER BACK TABLE 24X17X13 BIG (DRAPES) IMPLANT
COVER BACK TABLE 60X90IN (DRAPES) ×3 IMPLANT
DECANTER SPIKE VIAL GLASS SM (MISCELLANEOUS) ×3 IMPLANT
DRAPE C-ARM 42X72 X-RAY (DRAPES) ×6 IMPLANT
DRAPE LAPAROTOMY 100X72X124 (DRAPES) ×3 IMPLANT
DRAPE POUCH INSTRU U-SHP 10X18 (DRAPES) ×3 IMPLANT
DRAPE PROXIMA HALF (DRAPES) IMPLANT
DRAPE SURG 17X23 STRL (DRAPES) ×3 IMPLANT
DRSG OPSITE 4X5.5 SM (GAUZE/BANDAGES/DRESSINGS) ×6 IMPLANT
DRSG OPSITE POSTOP 4X10 (GAUZE/BANDAGES/DRESSINGS) ×2 IMPLANT
DURAPREP 26ML APPLICATOR (WOUND CARE) ×3 IMPLANT
ELECT REM PT RETURN 9FT ADLT (ELECTROSURGICAL) ×3
ELECTRODE REM PT RTRN 9FT ADLT (ELECTROSURGICAL) ×1 IMPLANT
EVACUATOR 3/16  PVC DRAIN (DRAIN) ×2
EVACUATOR 3/16 PVC DRAIN (DRAIN) ×1 IMPLANT
GAUZE SPONGE 4X4 12PLY STRL (GAUZE/BANDAGES/DRESSINGS) ×3 IMPLANT
GAUZE SPONGE 4X4 16PLY XRAY LF (GAUZE/BANDAGES/DRESSINGS) ×2 IMPLANT
GLOVE BIO SURGEON STRL SZ8 (GLOVE) ×6 IMPLANT
GLOVE BIOGEL PI IND STRL 8 (GLOVE) IMPLANT
GLOVE BIOGEL PI INDICATOR 8 (GLOVE) ×4
GLOVE ECLIPSE 7.0 STRL STRAW (GLOVE) ×4 IMPLANT
GLOVE ECLIPSE 7.5 STRL STRAW (GLOVE) ×6 IMPLANT
GLOVE EXAM NITRILE LRG STRL (GLOVE) IMPLANT
GLOVE EXAM NITRILE MD LF STRL (GLOVE) IMPLANT
GLOVE EXAM NITRILE XL STR (GLOVE) IMPLANT
GLOVE EXAM NITRILE XS STR PU (GLOVE) IMPLANT
GLOVE INDICATOR 8.5 STRL (GLOVE) ×6 IMPLANT
GOWN STRL REUS W/ TWL LRG LVL3 (GOWN DISPOSABLE) IMPLANT
GOWN STRL REUS W/ TWL XL LVL3 (GOWN DISPOSABLE) ×2 IMPLANT
GOWN STRL REUS W/TWL 2XL LVL3 (GOWN DISPOSABLE) ×2 IMPLANT
GOWN STRL REUS W/TWL LRG LVL3 (GOWN DISPOSABLE)
GOWN STRL REUS W/TWL XL LVL3 (GOWN DISPOSABLE) ×6
KIT BASIN OR (CUSTOM PROCEDURE TRAY) ×3 IMPLANT
KIT INFUSE XX SMALL 0.7CC (Orthopedic Implant) ×2 IMPLANT
KIT ROOM TURNOVER OR (KITS) ×3 IMPLANT
LIQUID BAND (GAUZE/BANDAGES/DRESSINGS) ×3 IMPLANT
MILL MEDIUM DISP (BLADE) ×2 IMPLANT
NDL HYPO 25X1 1.5 SAFETY (NEEDLE) ×1 IMPLANT
NEEDLE HYPO 25X1 1.5 SAFETY (NEEDLE) ×3 IMPLANT
NS IRRIG 1000ML POUR BTL (IV SOLUTION) ×3 IMPLANT
PACK LAMINECTOMY NEURO (CUSTOM PROCEDURE TRAY) ×3 IMPLANT
PAD ARMBOARD 7.5X6 YLW CONV (MISCELLANEOUS) ×9 IMPLANT
ROD CURVED REVERE 6.35X50MM (Rod) ×4 IMPLANT
SCREW REVERE 6.35 6.5X40MM (Screw) ×4 IMPLANT
SPONGE LAP 4X18 X RAY DECT (DISPOSABLE) IMPLANT
SPONGE SURGIFOAM ABS GEL 100 (HEMOSTASIS) ×3 IMPLANT
STRIP CLOSURE SKIN 1/2X4 (GAUZE/BANDAGES/DRESSINGS) ×4 IMPLANT
SUT BONE WAX W31G (SUTURE) ×2 IMPLANT
SUT VIC AB 0 CT1 18XCR BRD8 (SUTURE) ×2 IMPLANT
SUT VIC AB 0 CT1 8-18 (SUTURE) ×6
SUT VIC AB 2-0 CT1 18 (SUTURE) ×5 IMPLANT
SUT VICRYL 4-0 PS2 18IN ABS (SUTURE) ×3 IMPLANT
SYR 20ML ECCENTRIC (SYRINGE) ×3 IMPLANT
TOWEL OR 17X24 6PK STRL BLUE (TOWEL DISPOSABLE) ×3 IMPLANT
TOWEL OR 17X26 10 PK STRL BLUE (TOWEL DISPOSABLE) ×3 IMPLANT
TRAY FOLEY CATH 14FRSI W/METER (CATHETERS) ×3 IMPLANT
WATER STERILE IRR 1000ML POUR (IV SOLUTION) ×3 IMPLANT

## 2014-01-24 NOTE — Anesthesia Preprocedure Evaluation (Addendum)
Anesthesia Evaluation  Patient identified by MRN, date of birth, ID band Patient awake    Reviewed: Allergy & Precautions, H&P , NPO status , Patient's Chart, lab work & pertinent test results  Airway Mallampati: II  TM Distance: >3 FB Neck ROM: Full    Dental  (+) Edentulous Upper, Partial Lower   Pulmonary neg pulmonary ROS,  breath sounds clear to auscultation  Pulmonary exam normal       Cardiovascular hypertension, Rhythm:Regular Rate:Normal     Neuro/Psych    GI/Hepatic negative GI ROS, Neg liver ROS,   Endo/Other  negative endocrine ROS  Renal/GU negative Renal ROS     Musculoskeletal  (+) Arthritis -,   Abdominal Normal abdominal exam  (+)   Peds  Hematology   Anesthesia Other Findings   Reproductive/Obstetrics                          Anesthesia Physical Anesthesia Plan  ASA: III  Anesthesia Plan: General   Post-op Pain Management:    Induction: Intravenous  Airway Management Planned: Oral ETT  Additional Equipment:   Intra-op Plan:   Post-operative Plan: Extubation in OR  Informed Consent: I have reviewed the patients History and Physical, chart, labs and discussed the procedure including the risks, benefits and alternatives for the proposed anesthesia with the patient or authorized representative who has indicated his/her understanding and acceptance.   Dental advisory given  Plan Discussed with: CRNA, Anesthesiologist and Surgeon  Anesthesia Plan Comments:         Anesthesia Quick Evaluation

## 2014-01-24 NOTE — Transfer of Care (Signed)
Immediate Anesthesia Transfer of Care Note  Patient: Rebecca [name] [facility]  Procedure(s) Performed: Procedure(s): POSTERIOR LUMBAR FUSION 1 WITH HARDWARE REMOVAL (N/A)  Patient Location: PACU  Anesthesia Type:General  Level of Consciousness: awake, alert  and oriented  Airway & Oxygen Therapy: Patient Spontanous Breathing and Patient connected to face mask oxygen  Post-op Assessment: Report given to PACU RN  Post vital signs: Reviewed and stable  Complications: No apparent anesthesia complications

## 2014-01-24 NOTE — Anesthesia Postprocedure Evaluation (Signed)
  Anesthesia Post-op Note  Patient: Rebecca [name] [facility]  Procedure(s) Performed: Procedure(s): POSTERIOR LUMBAR FUSION 1 WITH HARDWARE REMOVAL (N/A)  Patient Location: PACU  Anesthesia Type:General  Level of Consciousness: awake  Airway and Oxygen Therapy: Patient Spontanous Breathing  Post-op Pain: mild  Post-op Assessment: Post-op Vital signs reviewed  Post-op Vital Signs: Reviewed  Last Vitals:  Filed Vitals:   01/24/14 1106  BP:   Pulse:   Temp: 36.3 C  Resp:     Complications: Patient re-intubated

## 2014-01-24 NOTE — H&P (Signed)
Rebecca [name] is an 73 y.o. female.   Chief Complaint: back and left leg pain HPI: patient is a very pleasant 73 year old female who has had progressive worsening back and left leg pain over the last several weeks and months refractory to all forms of conservative treatment. The pain radiates predominantly around L2 and L3 nerve root pattern the left leg medial and anterior thigh. Workup has revealed progressive degeneration deterioration lumbar spinal stenosis above her previous fusion L3-S1. Due to her progression of clinical syndrome failure conservative treatment imaging findings showing segmental breakdown L2-3, I have recommended L2-3 decompression and fusion with an exploration of fusion removal of hardware L3-S1. I've extensively gone over the risks and benefits of the operation with the patient as well as perioperative course expectations of outcome and alternatives of surgery and she understands and agrees to proceed forward.  Past Medical History  Diagnosis Date  . Hyperlipidemia   . Osteoporosis   . Chronic neck pain   . Hypertension     No longer takes med.  Marland Kitchen Dysphagia     esophageal stretched 9-14   . Complication of anesthesia     at one time my blood pressure dropped   . Arthritis     degenerative lumbar spine   . History of blood transfusion 2009    post - lumbar fusion     Past Surgical History  Procedure Laterality Date  . Cholecystectomy    . Abdominal hysterectomy    . Tonsillectomy    . Oophorectomy    . Shoulder surgery  2008    x 2/Dr. Collier Salina  . Back surgery  2009    Dr Saintclair Halsted  . Neck surgery  05/2008    Dr Saintclair Halsted  . Knee surgery  9-11    right,  scope; left knee  . Arm surgery      transposed ulner nerve 10/14/08 (dr. Amedeo Plenty)  . Carpal tunnell  2006    bilateral  . Rhinoplasty  1980  . Partial knee arthroplasty  01/17/2012    LEFT KNEE  . Partial knee arthroplasty  01/17/2012    Procedure: UNICOMPARTMENTAL KNEE;  Surgeon: Johnny Bridge, MD;   Location: [place];  Service: Orthopedics;  Laterality: Left;    Family History  Problem Relation Age of Onset  . Colon cancer Neg Hx   . Prostate cancer Father   . Diabetes Sister   . Heart disease Sister     sister and brother   . Kidney disease Sister   . Kidney disease Brother    Social History:  reports that she has never smoked. She has never used smokeless tobacco. She reports that she drinks alcohol. She reports that she does not use illicit drugs.  Allergies:  Allergies  Allergen Reactions  . Alendronate Sodium     REACTION: jaw pain  . Cortizone-10 [Hydrocortisone] Other (See Comments)    Pt received cortizone shot, and became very flushed, and hot, blisters, raw areas around lips & ears   . Oxycodone-Acetaminophen Hives  . Penicillins Swelling and Rash    Medications Prior to Admission  Medication Sig Dispense Refill  . aspirin 81 MG tablet Take 81 mg by mouth daily.      . calcium-vitamin D (OSCAL WITH D) 250-125 MG-UNIT per tablet Take 1 tablet by mouth 2 (two) times daily.    . Cholecalciferol (D3-1000 PO) Take 1,000 Units by mouth daily.      . fish oil-omega-3 fatty acids 1000 MG capsule  Take 1 g by mouth 3 (three) times daily.     . Multiple Vitamin (MULTIVITAMIN) tablet Take 1 tablet by mouth daily.    . multivitamin-lutein (OCUVITE-LUTEIN) CAPS capsule Take 1 capsule by mouth daily.    . simvastatin (ZOCOR) 40 MG tablet Take 1 tablet (40 mg total) by mouth every evening. 90 tablet 2  . traMADol (ULTRAM) 50 MG tablet Take 50 mg by mouth every 6 (six) hours as needed for moderate pain.      No results found for this or any previous visit (from the past 48 hour(s)). No results found.  Review of Systems  Constitutional: Negative.   HENT: Negative.   Eyes: Negative.   Respiratory: Negative.   Cardiovascular: Negative.   Gastrointestinal: Negative.   Genitourinary: Negative.   Musculoskeletal: Positive for myalgias, back pain and joint pain.  Skin:  Negative.   Neurological: Positive for sensory change.  Psychiatric/Behavioral: Negative.     Blood pressure 168/49, pulse 65, temperature 98.7 F (37.1 C), resp. rate 20, SpO2 100 %. Physical Exam  Constitutional: She is oriented to person, place, and time. She appears well-developed and well-nourished.  HENT:  Head: Normocephalic and atraumatic.  Eyes: Pupils are equal, round, and reactive to light.  Neck: Normal range of motion.  Respiratory: Effort normal.  GI: Soft.  Neurological: She is alert and oriented to person, place, and time. She has normal strength. GCS eye subscore is 4. GCS verbal subscore is 5. GCS motor subscore is 6.  Reflex Scores:      Patellar reflexes are 0 on the right side and 0 on the left side.      Achilles reflexes are 0 on the right side and 0 on the left side. Strength is 5 out of 5 in her iliopsoas, quads, and she's, gastrocs, and tibialis, and EHL.  Skin: Skin is warm and dry.     Assessment/Plan 73 year old female presents for an L2-3 decompressive laminectomy and fusion  Davian Wollenberg P 01/24/2014, 7:09 AM

## 2014-01-24 NOTE — Op Note (Signed)
Preoperative diagnosis: Lumbar spinal stenosis degenerative spondylolisthesis and degenerative scoliosis L2-3 with bilateral L2 and L3 radiculopathies  Postoperative diagnosis: Same  Procedure: Decompressive lumbar laminectomy L2-3 complete medial facetectomies and radical foraminotomies and excess and requiring more work than would be needed with a standard interbody fusion.  #2 posterior lumbar interbody fusion using the globus rise expandable peek cages packed with locally harvested autograft mixed with allostem morsels and BMP  #3 exploration of fusion removal of hardware L3-S1 with removal of bilateral L4, L5 and S1 screws.  #4 pedicle screw fixation L2-3 using the existing Legacy screws 6.35 at L3 with new globus 6.35 screws at L2  #5 posterior lateral arthrodesis L2-L3 utilizing locally harvested autograft mixed with allostem  #6 placement of a large Hemovac drain  Surgeon: Dominica Severin Oniya Mandarino  Asst.: Ashok Pall  Anesthesia: Gen.  EBL: 500  History of present illness: Patient is a 73 year old female who previously undergone L3-S1 fusion did very well however last several weeks and months of progressive worsening back and bilateral leg pain radiating down anterior medial thigh consistent with an L2 and L3 nerve root pattern. Workup revealed progressive deterioration degeneration above her fusion at L2-3 with severe stenosis scoliotic collapse and degenerative spondylolisthesis. Due to patient's failed cervical treatment imaging findings and progression of clinical syndrome I recommended reexploration of fusion removal of hardware and a decompressive laminectomy and fusion L2-3. I extensively went over the risks and benefits of the operation with the patient as well as perioperative course expectations of outcome and alternatives of surgery and she understood and agreed to proceed forward.  Operative procedure: Patient brought into the or was induced on general anesthesia positioned prone the  Wilson frame her back was prepped and draped in routine sterile fashion her old incision was identified and infiltrated with 10 mL lidocaine with epi and opened up extending slightly cephalad the scar tissues dissected free and subperiosteal dissection carried lamina of L2-3 and the hardware was identified at L3 and dissected all way down S1. The fusion appeared to be solid and subsequent removed the cross-link the rods the knots and the L4-L5 and S1 screws bilaterally. At this point remove the spinous processes at L2 central decompression was begun there was marked facet arthropathy causing severe hourglass compression of thecal sac as well as a dense amount of adhesive tissue and epidural fibrosis at that level causing severe compression specially on the right side. This was all teased off of the dura and removed completely fasciectomies were performed a radical foraminotomies of the L2 and L3 nerve roots bilaterally. Attention was first taken to the interbody work aggressively under biting with US of the superior tickling facet a ladder case lateral aspect the disc space epidural veins correlate disc space was incised bilaterally and cleanout pituitary rongeurs. Endplate preparation was carried on the patient's left side with paddle shavers Scovel's and Epstein curettes. Distractors were inserted sequentially distraction 0.11 on the patient's left side and working on the right endplates were prepared this was cleaned out and 11 mm expandable cage 26 mm length was placed and expanded to approximately 13 mm this significantly reduce the scoliotic deformity on the right and opened up the disc space and also opened up the L2 and L3 foramina. Fluoroscopy was used the step along the way at this point on the right the right-sided L2 screw was placed with a pilot hole cannulation probe tablet 55 Probed again a 6 5 x 40 mm screws inserted in the right L2. Then  working on the left side disc spaces cleanout centrally and  laterally after adequate endplate preparation been achieved local autograft was packed centrally a small BMP puffs once was packed anteriorly the contralateral cages in place expanded in similar fashion. Then the left L2 screws placed then posterior fluoroscopy confirmed good position of all the implants aggressive decortication was care MTPs or lateral gutters after copious irrigation local autograft BMP and allostem was placed posterior laterally from L2-L3 then the rods were connected top tightness tightened down the foraminal reinspected confirm patency Gelfoam was laid up the dura a large Hemovac drain was placed the wounds closed in layers with after Vicryl and a running 4 subcuticular. Dermabond benzo and Steri-Strips and sterile dressings applied patient recovered in stable condition. At the end of case all needle counts sponge counts were correct.

## 2014-01-24 NOTE — Progress Notes (Signed)
Paged Dr Saintclair Halsted to sign orders at [phone].  He has not returned my call.

## 2014-01-24 NOTE — Progress Notes (Signed)
Pt received at 13:10pm via stretcher alert, verbal with no distress. Pt stable, neuro intact. She denied pain or discomfort. Honeycomb and gauze dressing to surgical site dry and intact with no drainage. Hemovac in place. IV to left wrist saline locked. Foley in place draining clear, yellow urine. Pt educated and oriented to room. Husband at bedside. HOB elevated. Safety measures in place. Call bell within reach. Will continue to monitor.

## 2014-01-25 MED ORDER — GABAPENTIN 100 MG PO CAPS
100.0000 mg | ORAL_CAPSULE | Freq: Three times a day (TID) | ORAL | Status: DC
  Administered 2014-01-25 – 2014-01-27 (×7): 100 mg via ORAL
  Filled 2014-01-25 (×7): qty 1

## 2014-01-25 NOTE — Evaluation (Signed)
Physical Therapy Evaluation Patient Details Name: Rebecca [name] MRN: [id number] DOB: 1940-04-03 Today's Date: 01/25/2014   History of Present Illness  s/p PLIF L2-3 decompressive lumbar laminectomy L2-3  Clinical Impression  Patient is s/p lumbar surgery resulting in functional limitations due to the deficits listed below (see PT Problem List). Pt moving well and making great progress.  Patient will benefit from skilled PT to increase their independence and safety with mobility to allow discharge to the venue listed below.      Follow Up Recommendations No PT follow up;Supervision - Intermittent    Equipment Recommendations  None recommended by PT    Recommendations for Other Services       Precautions / Restrictions Precautions Precautions: Back Precaution Booklet Issued: Yes (comment) Precaution Comments: Educated on 3/3 back precautions and handout given Required Braces or Orthoses: Spinal Brace Spinal Brace: Lumbar corset;Applied in sitting position Restrictions Weight Bearing Restrictions: No      Mobility  Bed Mobility Overal bed mobility: Needs Assistance Bed Mobility: Rolling;Sidelying to Sit Rolling: Min guard Sidelying to sit: Min guard       General bed mobility comments: Minguard for safety with cues for proper LE and UE placement to prevent twisting  Transfers Overall transfer level: Needs assistance Equipment used: Rolling walker (2 wheeled) Transfers: Sit to/from Stand Sit to Stand: Min guard         General transfer comment: VCs for proper hand placement and RW placement  Ambulation/Gait Ambulation/Gait assistance: Min guard Ambulation Distance (Feet): 150 Feet Assistive device: Rolling walker (2 wheeled) Gait Pattern/deviations: Step-through pattern Gait velocity: decreased due to pain      Stairs            Wheelchair Mobility    Modified Rankin (Stroke Patients Only)       Balance                                              Pertinent Vitals/Pain Pain Assessment: 0-10 Pain Score: 7  Pain Location: back at incision Pain Descriptors / Indicators: Burning;Aching Pain Intervention(s): Repositioned;Premedicated before session;Monitored during session    Home Living Family/patient expects to be discharged to:: Private residence Living Arrangements: Spouse/significant other Available Help at Discharge: Family;Available 24 hours/day Type of Home: House Home Access: Stairs to enter Entrance Stairs-Rails: None (step then platform then next step; room for RW between steps) Entrance Stairs-Number of Steps: 2 Home Layout: Two level;1/2 bath on main level (set up room for downstairs) Home Equipment: Walker - 2 wheels      Prior Function Level of Independence: Independent               Hand Dominance   Dominant Hand: Right    Extremity/Trunk Assessment   Upper Extremity Assessment: Defer to OT evaluation           Lower Extremity Assessment: Overall WFL for tasks assessed         Communication   Communication: No difficulties  Cognition Arousal/Alertness: Awake/alert Behavior During Therapy: WFL for tasks assessed/performed Overall Cognitive Status: Within Functional Limits for tasks assessed                      General Comments      Exercises        Assessment/Plan    PT Assessment Patient needs continued PT services  PT Diagnosis Difficulty walking;Acute pain   PT Problem List Decreased activity tolerance;Decreased mobility;Decreased knowledge of use of DME;Pain;Decreased knowledge of precautions  PT Treatment Interventions DME instruction;Gait training;Stair training;Functional mobility training;Therapeutic activities;Therapeutic exercise;Patient/family education   PT Goals (Current goals can be found in the Care Plan section) Acute Rehab PT Goals Patient Stated Goal: To return home when ready and to increase ambulation distance PT Goal  Formulation: With patient Time For Goal Achievement: 02/01/14 Potential to Achieve Goals: Good    Frequency Min 5X/week   Barriers to discharge        Co-evaluation               End of Session Equipment Utilized During Treatment: Gait belt;Back brace Activity Tolerance: Patient tolerated treatment well Patient left: in chair;with call bell/phone within reach Nurse Communication: Mobility status         Time: 0825-0854 PT Time Calculation (min) (ACUTE ONLY): 29 min   Charges:   PT Evaluation $Initial PT Evaluation Tier I: 1 Procedure PT Treatments $Gait Training: 8-22 mins   PT G Codes:          Ryanna Teschner 02-04-14, 9:02 AM  Antoine Poche, [place] DPT [phone]

## 2014-01-25 NOTE — Plan of Care (Signed)
Problem: Phase I Progression Outcomes Goal: OOB as tolerated unless otherwise ordered Outcome: Completed/Met Date Met:  01/25/14 Goal: Log roll for position change Outcome: Completed/Met Date Met:  01/25/14 Goal: Initial discharge plan identified Outcome: Completed/Met Date Met:  01/25/14 Goal: Other Phase I Outcomes/Goals Outcome: Not Applicable Date Met:  50/35/46

## 2014-01-25 NOTE — Progress Notes (Signed)
INITIAL NUTRITION ASSESSMENT  Pt meets criteria for SEVERE MALNUTRITION in the context of chronic illness as evidenced by a 11% weight loss in 5 months and energy intake </= 75% for >/= 1 month.  DOCUMENTATION CODES Per approved criteria  -Severe malnutrition in the context of chronic illness -Obesity Unspecified   INTERVENTION: Provide nourishment snack (carnation instant breakfast). Ordered.  Encourage PO intake.  NUTRITION DIAGNOSIS: Inadequate oral intake related to decreased appetite as evidenced by 50%.   Goal: Pt to meet >/= 90% of their estimated nutrition needs   Monitor:  PO intake, weight trends, labs, I/O's  Reason for Assessment: MST  73 y.o. female  Admitting Dx: Spinal Stenosis  ASSESSMENT: Pt who has had progressive worsening back and left leg pain over the last several weeks. Workup has revealed progressive degeneration deterioration lumbar spinal stenosis above her previous fusion L3-S1.  Procedure (11/13): POSTERIOR LUMBAR FUSION 1 WITH HARDWARE REMOVAL (N/A)  Pt reports a decreased appetite which has been ongoing over the past 5 months due to back pain. Pt reports she has been eating less. PTA she would usually eat a piece of toast for breakfast, a sandwich at lunch, and a small tomato and cucumber salad for dinner. Prior to her decreased appetite pt reports eating very well with 3 full meals a day that includes a meat, starch, and vegetables. Pt reports having lost weight with her usual body weight of 200 lbs 5 months ago. Pt with a 11% weight loss in 5 months. Pt declined oral supplements, but is agreeable to carnation instant breakfast and she usually consumes that once in a while. Pt was encouraged to eat her food at meals.  Pt with no observed significant fat or muscle mass loss.  Height: Ht Readings from Last 1 Encounters:  01/24/14 5' (1.524 m)    Weight: Wt Readings from Last 1 Encounters:  01/24/14 178 lb (80.74 kg)    Ideal Body Weight:  100 lbs  % Ideal Body Weight: 178%  Wt Readings from Last 10 Encounters:  01/24/14 178 lb (80.74 kg)  01/16/14 182 lb 1.6 oz (82.6 kg)  12/19/12 198 lb 3.2 oz (89.903 kg)  12/10/12 195 lb (88.451 kg)  11/22/12 195 lb 4 oz (88.565 kg)  05/08/12 200 lb (90.719 kg)  04/24/12 200 lb (90.719 kg)  01/17/12 205 lb 0.4 oz (93 kg)  01/10/12 205 lb (92.987 kg)  11/25/11 209 lb (94.802 kg)    Usual Body Weight: 200 lbs  % Usual Body Weight: 89%  BMI:  Body mass index is 34.76 kg/(m^2). Class I obesity  Estimated Nutritional Needs: Kcal: 1850-2050 Protein: 80-95 grams Fluid: 1.85 - 2.05 L/day  Skin: incision on back  Diet Order: Diet regular  EDUCATION NEEDS: -No education needs identified at this time   Intake/Output Summary (Last 24 hours) at 01/25/14 1130 Last data filed at 01/25/14 0959  Gross per 24 hour  Intake      0 ml  Output    530 ml  Net   -530 ml    Last BM: 11/12  Labs:  No results for input(s): NA, K, CL, CO2, BUN, CREATININE, CALCIUM, MG, PHOS, GLUCOSE in the last 168 hours.  CBG (last 3)  No results for input(s): GLUCAP in the last 72 hours.  Scheduled Meds: . aspirin EC  81 mg Oral Daily  . calcium-vitamin D  1 tablet Oral Q breakfast  . docusate sodium  100 mg Oral BID  . gabapentin  100 mg Oral  TID  . multivitamin-lutein  1 capsule Oral Daily  . simvastatin  40 mg Oral QPM  . sodium chloride  3 mL Intravenous Q12H    Continuous Infusions: . sodium chloride      Past Medical History  Diagnosis Date  . Hyperlipidemia   . Osteoporosis   . Chronic neck pain   . Hypertension     No longer takes med.  Marland Kitchen Dysphagia     esophageal stretched 9-14   . Complication of anesthesia     at one time my blood pressure dropped   . Arthritis     degenerative lumbar spine   . History of blood transfusion 2009    post - lumbar fusion     Past Surgical History  Procedure Laterality Date  . Cholecystectomy    . Abdominal hysterectomy    .  Tonsillectomy    . Oophorectomy    . Shoulder surgery  2008    x 2/Dr. Collier Salina  . Back surgery  2009    Dr Saintclair Halsted  . Neck surgery  05/2008    Dr Saintclair Halsted  . Knee surgery  9-11    right,  scope; left knee  . Arm surgery      transposed ulner nerve 10/14/08 (dr. Amedeo Plenty)  . Carpal tunnell  2006    bilateral  . Rhinoplasty  1980  . Partial knee arthroplasty  01/17/2012    LEFT KNEE  . Partial knee arthroplasty  01/17/2012    Procedure: UNICOMPARTMENTAL KNEE;  Surgeon: Johnny Bridge, MD;  Location: [place];  Service: Orthopedics;  Laterality: Left;    Kallie Locks, MS, RD, LDN Pager # [phone] After hours/ weekend pager # [phone]

## 2014-01-25 NOTE — Progress Notes (Signed)
Subjective: Patient reports right buttock pain, otherwise OK.  Objective: Vital signs in last 24 hours: Temp:  [97.3 F (36.3 C)-98.4 F (36.9 C)] 98.4 F (36.9 C) (11/14 0252) Pulse Rate:  [51-77] 64 (11/14 0252) Resp:  [0-18] 18 (11/13 2210) BP: (114-157)/(38-79) 144/49 mmHg (11/14 0252) SpO2:  [97 %-100 %] 97 % (11/14 0252) Weight:  [80.74 kg (178 lb)] 80.74 kg (178 lb) (11/13 1508)  Intake/Output from previous day: 11/13 0701 - 11/14 0700 In: 3200 [I.V.:2500; Blood:200; IV Piggyback:500] Out: 900 [Urine:80; Drains:320; Blood:500] Intake/Output this shift: Total I/O In: -  Out: 90 [Drains:90]  Physical Exam: Patient has good strength.  She is up and ambulating in her brace.  Lab Results: No results for input(s): WBC, HGB, HCT, PLT in the last 72 hours. BMET No results for input(s): NA, K, CL, CO2, GLUCOSE, BUN, CREATININE, CALCIUM in the last 72 hours.  Studies/Results: Dg Lumbar Spine 2-3 Views  01/24/2014   CLINICAL DATA:  L2-L3 PLIF with hardware removal.  EXAM: DG C-ARM 61-120 MIN; LUMBAR SPINE - 2-3 VIEW  FLUOROSCOPY TIME:  25 seconds  COMPARISON:  Lumbar spine CT -01/21/2014  FINDINGS: Two spot intraoperative radiographic images of the lower lumbar spine a provided for review. Spinal labeling is difficult secondary to exclusion of the lumbosacral junction and based on the presumption of remaining intervertebral disc space device at L3-L4. Interval removal of previously noted paraspinal fusion hardware.  Post paraspinal fusion of L2 and L3 and new intervertebral disc space replacement at this location. Additional radiopaque surgical support apparatus is seen posterior to the operative site. Surgical drains are noted about the caudal aspect of the surgical site. No definite radiopaque foreign body.  IMPRESSION: 1. Removal of pre-existing paraspinal fusion hardware. 2. Interval L2-L3 paraspinal fusion and intervertebral disc space replacement as above.   Electronically Signed    By: Sandi Mariscal M.D.   On: 01/24/2014 11:29   Dg C-arm 1-60 Min  01/24/2014   CLINICAL DATA:  L2-L3 PLIF with hardware removal.  EXAM: DG C-ARM 61-120 MIN; LUMBAR SPINE - 2-3 VIEW  FLUOROSCOPY TIME:  25 seconds  COMPARISON:  Lumbar spine CT -01/21/2014  FINDINGS: Two spot intraoperative radiographic images of the lower lumbar spine a provided for review. Spinal labeling is difficult secondary to exclusion of the lumbosacral junction and based on the presumption of remaining intervertebral disc space device at L3-L4. Interval removal of previously noted paraspinal fusion hardware.  Post paraspinal fusion of L2 and L3 and new intervertebral disc space replacement at this location. Additional radiopaque surgical support apparatus is seen posterior to the operative site. Surgical drains are noted about the caudal aspect of the surgical site. No definite radiopaque foreign body.  IMPRESSION: 1. Removal of pre-existing paraspinal fusion hardware. 2. Interval L2-L3 paraspinal fusion and intervertebral disc space replacement as above.   Electronically Signed   By: Sandi Mariscal M.D.   On: 01/24/2014 11:29    Assessment/Plan: Continue drain (200 cc last night).  Mobilize with PT.  Low dose neurontin.  Continue dilaudid and tramadol.    LOS: 1 day    Peggyann Shoals, MD 01/25/2014, 6:21 AM

## 2014-01-25 NOTE — Plan of Care (Signed)
Problem: Phase I Progression Outcomes Goal: PT/OT consults requested Outcome: Completed/Met Date Met:  01/25/14

## 2014-01-25 NOTE — Progress Notes (Signed)
Occupational Therapy Evaluation Patient Details Name: Rebecca [name] MRN: [id number] DOB: Jan 14, 1941 Today's Date: 01/25/2014    History of Present Illness Rebecca [name] is a 73 y.o. Female s/p PLIF with hardware removal L2-3.    Clinical Impression   PTA pt lived at home with her son and husband and was independent with ADLs and functional mobility. Pt currently limited by decreased ROM and requires (A) for LB ADLs, however is mobilizing at Supervision level. Pt will benefit from acute OT to address independence with LB ADLs prior to d/c home.     Follow Up Recommendations  No OT follow up;Supervision/Assistance - 24 hour    Equipment Recommendations  None recommended by OT    Recommendations for Other Services       Precautions / Restrictions Precautions Precautions: Back Precaution Booklet Issued: Yes (comment) Precaution Comments: Educated pt on 3/3 back precautions and incorporating into ADLs.  Required Braces or Orthoses: Spinal Brace Spinal Brace: Lumbar corset;Applied in sitting position Restrictions Weight Bearing Restrictions: No      Mobility Bed Mobility Overal bed mobility: Needs Assistance Bed Mobility: Rolling;Sidelying to Sit;Sit to Sidelying Rolling: Supervision Sidelying to sit: Supervision     Sit to sidelying: Supervision General bed mobility comments: Supervision for safety. Good technique. No VC's needed.   Transfers Overall transfer level: Needs assistance Equipment used: Rolling walker (2 wheeled) Transfers: Sit to/from Stand Sit to Stand: Supervision         General transfer comment: Pt with good technique. Supervision for safety.          ADL Overall ADL's : Needs assistance/impaired Eating/Feeding: Independent;Sitting   Grooming: Oral care;Supervision/safety;Standing   Upper Body Bathing: Set up;Sitting   Lower Body Bathing: Minimal assistance;Sit to/from stand   Upper Body Dressing : Set up;Sitting   Lower  Body Dressing: Minimal assistance;Sit to/from stand Lower Body Dressing Details (indicate cue type and reason): pt plans to wear slip on loafers or sneakers and skirts with elastic waistbands Toilet Transfer: Supervision/safety;Ambulation;RW           Functional mobility during ADLs: Supervision/safety;Rolling walker General ADL Comments: Pt mobilizing quite well today and reports pain is significantly better. Educated pt on fall prevention and energy conservation techniques, as well as ways to incorporate back precautions into ADLs. Pt will have assistance of her husband and her adult son.      Vision  Pt reports no change from baseline. Pt wears glasses at all times.                    Perception Perception Perception Tested?: No   Praxis Praxis Praxis tested?: Within functional limits    Pertinent Vitals/Pain Pain Assessment: 0-10 Pain Score: 3  Pain Location: back Pain Descriptors / Indicators: Tender;Sore Pain Intervention(s): Monitored during session     Hand Dominance Right   Extremity/Trunk Assessment Upper Extremity Assessment Upper Extremity Assessment: Overall WFL for tasks assessed   Lower Extremity Assessment Lower Extremity Assessment: Overall WFL for tasks assessed   Cervical / Trunk Assessment Cervical / Trunk Assessment: Normal   Communication Communication Communication: No difficulties   Cognition Arousal/Alertness: Awake/alert Behavior During Therapy: WFL for tasks assessed/performed Overall Cognitive Status: Within Functional Limits for tasks assessed                                Home Living Family/patient expects to be discharged to:: Private residence Living Arrangements: Spouse/significant other;Children (  adult son with special needs) Available Help at Discharge: Family;Available 24 hours/day Type of Home: House Home Access: Stairs to enter [employer] of Steps: 2 Entrance Stairs-Rails: None (step then  platform then step; room for RW on steps) Home Layout: Two level;1/2 bath on main level;Able to live on main level with bedroom/bathroom Alternate Level Stairs-Number of Steps: 14 Alternate Level Stairs-Rails: Right Bathroom Shower/Tub: Teacher, early years/pre: Standard     Home Equipment: Environmental consultant - 2 wheels;Shower seat          Prior Functioning/Environment Level of Independence: Independent        Comments: Pt enjoys crocheting for the babies at [employer].     OT Diagnosis: Acute pain   OT Problem List: Decreased range of motion;Decreased activity tolerance;Decreased knowledge of precautions;Pain   OT Treatment/Interventions: Self-care/ADL training;Therapeutic exercise;Energy conservation;DME and/or AE instruction;Therapeutic activities;Patient/family education;Balance training    OT Goals(Current goals can be found in the care plan section) Acute Rehab OT Goals Patient Stated Goal: To start walking when I get home OT Goal Formulation: With patient Time For Goal Achievement: 02/08/14 Potential to Achieve Goals: Good ADL Goals Pt Will Perform Grooming: with modified independence;standing Pt Will Perform Lower Body Bathing: with set-up;with supervision;sit to/from stand Pt Will Perform Lower Body Dressing: with set-up;with supervision;sit to/from stand Pt Will Transfer to Toilet: with modified independence;ambulating Pt Will Perform Toileting - Clothing Manipulation and hygiene: with modified independence;sit to/from stand Pt Will Perform Tub/Shower Transfer: Tub transfer;with supervision;ambulating;shower seat;rolling walker Additional ADL Goal #1: Pt will verbalize 3/3 back precautions to increase safety with ADLs.   OT Frequency: Min 2X/week    End of Session Equipment Utilized During Treatment: Gait belt;Rolling walker;Back brace  Activity Tolerance: Patient tolerated treatment well Patient left: in bed;with call bell/phone within reach   Time:  5790-3833 OT Time Calculation (min): 46 min Charges:  OT General Charges $OT Visit: 1 Procedure OT Evaluation $Initial OT Evaluation Tier I: 1 Procedure OT Treatments $Self Care/Home Management : 8-22 mins $Therapeutic Activity: 8-22 mins  Rebecca [name] 01/25/2014, 2:25 PM   Cyndie Chime, OTR/L Occupational Therapist [phone] (pager)

## 2014-01-26 NOTE — Plan of Care (Signed)
Problem: Phase II Progression Outcomes Goal: Verbalizes of donning/doffing brace Outcome: Completed/Met Date Met:  01/26/14     

## 2014-01-26 NOTE — Progress Notes (Signed)
Patient ID: Rebecca [name], female   DOB: 12-04-1940, 73 y.o.   MRN: [id number] BP 126/56 mmHg  Pulse 78  Temp(Src) 98.2 F (36.8 C) (Oral)  Resp 16  Ht 5' (1.524 m)  Wt 80.74 kg (178 lb)  BMI 34.76 kg/m2  SpO2 95% Alert and oriented x 4, speech is clear and fluent Wound dressing intact, clean Will remove drain today 5/5 strength in the lower extremities

## 2014-01-26 NOTE — Plan of Care (Signed)
Problem: Phase I Progression Outcomes Goal: Hemodynamically stable Outcome: Completed/Met Date Met:  01/26/14

## 2014-01-26 NOTE — Plan of Care (Signed)
Problem: Consults Goal: Nutrition Consult-if indicated Outcome: Completed/Met Date Met:  01/26/14

## 2014-01-26 NOTE — Plan of Care (Signed)
Problem: Phase II Progression Outcomes Goal: Discharge plan established Outcome: Completed/Met Date Met:  Oct 31, 2013

## 2014-01-26 NOTE — Progress Notes (Signed)
Physical Therapy Treatment Patient Details Name: Rebecca [name] MRN: [id number] DOB: February 09, 1941 Today's Date: 01/26/2014    History of Present Illness      PT Comments    Excellent progress.  Stair training complete.  Continue per POC.  Follow Up Recommendations  No PT follow up;Supervision - Intermittent     Equipment Recommendations  None recommended by PT    Recommendations for Other Services       Precautions / Restrictions Precautions Precautions: Back Precaution Comments: Reviewed 3/3 back precautions.  Pt independently recalled 1/3. Required Braces or Orthoses: Spinal Brace Spinal Brace: Lumbar corset;Applied in sitting position Restrictions Weight Bearing Restrictions: No    Mobility  Bed Mobility               General bed mobility comments: Pt up in chair upon PT arrival.  Reviewed logroll technique.  Transfers   Equipment used: Rolling walker (2 wheeled)   Sit to Stand: Supervision         General transfer comment: Pt with good technique. Supervision for safety.   Ambulation/Gait Ambulation/Gait assistance: Min guard Ambulation Distance (Feet): 350 Feet Assistive device: Rolling walker (2 wheeled) Gait Pattern/deviations: Step-through pattern;Decreased stride length Gait velocity: decreased due to pain       Stairs Stairs: Yes Stairs assistance: Min guard Stair Management: Two rails;Forwards;Step to pattern Number of Stairs: 3    Wheelchair Mobility    Modified Rankin (Stroke Patients Only)       Balance                                    Cognition Arousal/Alertness: Awake/alert Behavior During Therapy: WFL for tasks assessed/performed Overall Cognitive Status: Within Functional Limits for tasks assessed                      Exercises      General Comments        Pertinent Vitals/Pain Pain Assessment: 0-10 Pain Score: 6  Pain Location: back Pain Intervention(s): Monitored during  session;Repositioned    Home Living                      Prior Function            PT Goals (current goals can now be found in the care plan section) Progress towards PT goals: Progressing toward goals    Frequency  Min 5X/week    PT Plan Current plan remains appropriate    Co-evaluation             End of Session Equipment Utilized During Treatment: Gait belt;Back brace Activity Tolerance: Patient tolerated treatment well Patient left: in chair;with call bell/phone within reach     Time: 0848-0902 PT Time Calculation (min) (ACUTE ONLY): 14 min  Charges:  $Gait Training: 8-22 mins                    G Codes:      Lorriane Shire 01/26/2014, 10:58 AM

## 2014-01-26 NOTE — Plan of Care (Signed)
Problem: Phase I Progression Outcomes Goal: Pain controlled with appropriate interventions Outcome: Completed/Met Date Met:  01/26/14     

## 2014-01-26 NOTE — Progress Notes (Addendum)
Removed two stitches securing the JP drain during removal of device.  Dressing was taped to the honeycomb web dressing so I removed the drain without complications and patient tolerated the procedure well.  Replaced the top layer of honeycomb dressing but did not disturb the steri strips underneath.

## 2014-01-26 NOTE — Plan of Care (Signed)
Problem: Phase II Progression Outcomes Goal: Tolerating diet Outcome: Completed/Met Date Met:  01/26/14     

## 2014-01-27 MED ORDER — TRAMADOL HCL 50 MG PO TABS: 50.0000 mg | ORAL_TABLET | Freq: Four times a day (QID) | ORAL | Status: DC | PRN

## 2014-01-27 MED FILL — Heparin Sodium (Porcine) Inj 1000 Unit/ML: INTRAMUSCULAR | Qty: 30 | Status: AC

## 2014-01-27 MED FILL — Sodium Chloride IV Soln 0.9%: INTRAVENOUS | Qty: 2000 | Status: AC

## 2014-01-27 NOTE — Progress Notes (Signed)
Patient discharge education and paperwork complete.  IV removed. Patient;s husband to drive her home.  Kizzie Bane, RN

## 2014-01-27 NOTE — Progress Notes (Signed)
   Met with pt. Prior to d/c home.  Offered demonstration and review of tub transfer with tub bench.  Pt. States she used the bench prior to sx. And feels confident with transfer.  Declines practice of this.  Also states that she is currently getting in/out of bed and to/from the b.room with out assistance.  Home has been prepared to remove any fall risks.  Pt. States she has family assist at home and declines need for any adl review today.  Provided opportunity for pt. To ask any questions and she states she is ready for d/c home.   Romana Juniper, COTA/L

## 2014-01-27 NOTE — Plan of Care (Signed)
Problem: Discharge Progression Outcomes Goal: Discharge plan in place and appropriate Outcome: Completed/Met Date Met:  01/27/14

## 2014-01-27 NOTE — Plan of Care (Signed)
Problem: Discharge Progression Outcomes Goal: Pain controlled with appropriate interventions Outcome: Completed/Met Date Met:  01/27/14

## 2014-01-27 NOTE — Plan of Care (Signed)
Problem: Discharge Progression Outcomes Goal: Ambulates without assistance Outcome: Completed/Met Date Met:  01/27/14

## 2014-01-27 NOTE — Discharge Summary (Signed)
  Physician Discharge Summary  Patient ID: Rebecca [name] MRN: [id number] DOB/AGE: Jan 18, 1941 73 y.o.  Admit date: 01/24/2014 Discharge date: 01/27/2014  Admission Diagnoses:segmental instability L2-3  Discharge Diagnoses: same Active Problems:   Spinal stenosis   Discharged Condition: good  Hospital Course: she was admitted to the hospital went to the operating room underwent exploration of fusion removal of hardware L3-S1 with a posterior lumbar interbody fusion L2-3 and postoperatively patient did very well with recovered in the floor on the floor she was ambulating and voiding spontaneously tolerating regular diet pain well controlled on pills and be able to discharged on postop day 3.  Consults: Significant Diagnostic Studies: Treatments:L2-3 posterior lumbar interbody fusion removal of hardware L3-S1 Discharge Exam: Blood pressure 133/53, pulse 79, temperature 99 F (37.2 C), temperature source Oral, resp. rate 18, height 5' (1.524 m), weight 80.74 kg (178 lb), SpO2 97 %. Strength out of 5 wound clean dry and intact  Disposition: home     Medication List    TAKE these medications        aspirin 81 MG tablet  Take 81 mg by mouth daily.     calcium-vitamin D 250-125 MG-UNIT per tablet  Commonly known as:  OSCAL WITH D  Take 1 tablet by mouth 2 (two) times daily.     D3-1000 PO  Take 1,000 Units by mouth daily.     fish oil-omega-3 fatty acids 1000 MG capsule  Take 1 g by mouth 3 (three) times daily.     multivitamin tablet  Take 1 tablet by mouth daily.     multivitamin-lutein Caps capsule  Take 1 capsule by mouth daily.     simvastatin 40 MG tablet  Commonly known as:  ZOCOR  Take 1 tablet (40 mg total) by mouth every evening.     traMADol 50 MG tablet  Commonly known as:  ULTRAM  Take 50 mg by mouth every 6 (six) hours as needed for moderate pain.     traMADol 50 MG tablet  Commonly known as:  ULTRAM  Take 1 tablet (50 mg total) by mouth every  6 (six) hours as needed for moderate pain.           Follow-up Information    Follow up with [facility], MD.   Specialty:  Neurosurgery   Contact information:   [address] [place] [address] [phone]       Signed: Rayon Mcchristian P 01/27/2014, 10:21 AM

## 2014-01-27 NOTE — Progress Notes (Signed)
Patient ID: Rebecca [name], female   DOB: 05-02-1940, 73 y.o.   MRN: [id number] Patient doing well no leg pain back pain well-controlled  Strength out of 5 wound clean dry and intact  Discharge home

## 2014-01-27 NOTE — Discharge Instructions (Signed)
No lifting no bending no twisting no driving unless she has come back and forth to see me. Keep the incision clean dry and intact.

## 2014-01-27 NOTE — Plan of Care (Signed)
Problem: Discharge Progression Outcomes Goal: Tolerates diet Outcome: Completed/Met Date Met:  01/27/14

## 2014-01-27 NOTE — Plan of Care (Signed)
Problem: Discharge Progression Outcomes Goal: Barriers To Progression Addressed/Resolved Outcome: Completed/Met Date Met:  01/27/14     

## 2014-01-27 NOTE — Progress Notes (Signed)
Physical Therapy Treatment Patient Details Name: Rebecca [name] MRN: [id number] DOB: October 13, 1940 Today's Date: 01/27/2014    History of Present Illness Rebecca [name] is a 73 y.o. Female s/p PLIF with hardware removal L2-3.     PT Comments    Patient progressing very well with overall mobility. Requested to increase ambulation this session. Reviewed safety at home and use of DME. Patient able to recall all precaution and don brace without assistance. Husband not present this session but she stated that he had been able to see her walk. Patient safe to D/C from a mobility standpoint based on progression towards goals set on PT eval.    Follow Up Recommendations  No PT follow up;Supervision - Intermittent     Equipment Recommendations  None recommended by PT    Recommendations for Other Services       Precautions / Restrictions Precautions Precaution Comments: Patient able to recall 3/3 back precautions.  Required Braces or Orthoses: Spinal Brace Spinal Brace: Lumbar corset;Applied in sitting position Restrictions Weight Bearing Restrictions: No    Mobility  Bed Mobility Overal bed mobility: Modified Independent                Transfers Overall transfer level: Modified independent                  Ambulation/Gait Ambulation/Gait assistance: Supervision Ambulation Distance (Feet): 1200 Feet Assistive device: Rolling walker (2 wheeled) Gait Pattern/deviations: Step-through pattern   Gait velocity interpretation: at or above normal speed for age/gender     Stairs   Stairs assistance: Supervision Stair Management: Two rails;Step to pattern;Forwards Number of Stairs: 6 General stair comments: Safe technique. No balance issues  Wheelchair Mobility    Modified Rankin (Stroke Patients Only)       Balance                                    Cognition Arousal/Alertness: Awake/alert Behavior During Therapy: WFL for tasks  assessed/performed Overall Cognitive Status: Within Functional Limits for tasks assessed                      Exercises      General Comments        Pertinent Vitals/Pain Pain Score: 3  Pain Location: R leg pain Pain Descriptors / Indicators: Radiating Pain Intervention(s): Monitored during session    Home Living                      Prior Function            PT Goals (current goals can now be found in the care plan section) Progress towards PT goals: Progressing toward goals    Frequency  Min 5X/week    PT Plan Current plan remains appropriate    Co-evaluation             End of Session Equipment Utilized During Treatment: Back brace Activity Tolerance: Patient tolerated treatment well Patient left: in bed;with call bell/phone within reach     Time: 0808-0832 PT Time Calculation (min) (ACUTE ONLY): 24 min  Charges:  $Gait Training: 8-22 mins $Therapeutic Activity: 8-22 mins                    G Codes:      Jacqualyn Posey 01/27/2014, 8:40 AM 01/27/2014 Jacqualyn Posey PTA [phone] pager [phone] office

## 2014-01-27 NOTE — Plan of Care (Signed)
Problem: Discharge Progression Outcomes Goal: Demonstrates proper use of assistive devices Outcome: Completed/Met Date Met:  01/27/14

## 2014-01-27 NOTE — Plan of Care (Signed)
Problem: Discharge Progression Outcomes Goal: Hemodynamically stable Outcome: Completed/Met Date Met:  01/27/14

## 2014-02-03 ENCOUNTER — Other Ambulatory Visit: Payer: Self-pay

## 2014-03-26 ENCOUNTER — Telehealth: Payer: Self-pay | Admitting: Internal Medicine

## 2014-03-26 ENCOUNTER — Ambulatory Visit (INDEPENDENT_AMBULATORY_CARE_PROVIDER_SITE_OTHER): Payer: PPO | Admitting: Internal Medicine

## 2014-03-26 ENCOUNTER — Encounter: Payer: Self-pay | Admitting: Internal Medicine

## 2014-03-26 ENCOUNTER — Telehealth: Payer: Self-pay | Admitting: *Deleted

## 2014-03-26 VITALS — BP 142/83 | HR 80 | Temp 97.8°F | Ht 60.0 in | Wt 163.2 lb

## 2014-03-26 DIAGNOSIS — M8580 Other specified disorders of bone density and structure, unspecified site: Secondary | ICD-10-CM

## 2014-03-26 DIAGNOSIS — M150 Primary generalized (osteo)arthritis: Secondary | ICD-10-CM

## 2014-03-26 DIAGNOSIS — I10 Essential (primary) hypertension: Secondary | ICD-10-CM

## 2014-03-26 DIAGNOSIS — Z23 Encounter for immunization: Secondary | ICD-10-CM

## 2014-03-26 DIAGNOSIS — M159 Polyosteoarthritis, unspecified: Secondary | ICD-10-CM

## 2014-03-26 DIAGNOSIS — E785 Hyperlipidemia, unspecified: Secondary | ICD-10-CM

## 2014-03-26 DIAGNOSIS — Z0000 Encounter for general adult medical examination without abnormal findings: Secondary | ICD-10-CM

## 2014-03-26 LAB — LIPID PANEL
Cholesterol: 159 mg/dL (ref 0–200)
HDL: 62.9 mg/dL (ref >39.00)
LDL CALC: 80 mg/dL (ref 0–99)
NONHDL: 96.1
Total CHOL/HDL Ratio: 3
Triglycerides: 79 mg/dL (ref 0.0–149.0)
VLDL: 15.8 mg/dL (ref 0.0–40.0)

## 2014-03-26 LAB — TSH: TSH: 3.04 u[IU]/mL (ref 0.35–4.50)

## 2014-03-26 LAB — ALT: ALT: 13 U/L (ref 0–35)

## 2014-03-26 LAB — AST: AST: 19 U/L (ref 0–37)

## 2014-03-26 MED ORDER — SIMVASTATIN 40 MG PO TABS: 40.0000 mg | ORAL_TABLET | Freq: Every evening | ORAL | Status: DC

## 2014-03-26 NOTE — Assessment & Plan Note (Signed)
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 Recommend calcium and vitamin D supplements

## 2014-03-26 NOTE — Assessment & Plan Note (Signed)
Continue with simvastatin, check FLP, LFTs. TSH

## 2014-03-26 NOTE — Patient Instructions (Signed)
Get your blood work before you leave   Check the  blood pressure   monthly   Be sure your blood pressure is between 110/65 and  145/85.  if it is consistently higher or lower, let me know    Please come back to the office in 1 year  for a physical exam. Come back fasting        Fall Prevention and Home Safety Falls cause injuries and can affect all age groups. It is possible to use preventive measures to significantly decrease the likelihood of falls. There are many simple measures which can make your home safer and prevent falls. OUTDOORS  Repair cracks and edges of walkways and driveways.  Remove high doorway thresholds.  Trim shrubbery on the main path into your home.  Have good outside lighting.  Clear walkways of tools, rocks, debris, and clutter.  Check that handrails are not broken and are securely fastened. Both sides of steps should have handrails.  Have leaves, snow, and ice cleared regularly.  Use sand or salt on walkways during winter months.  In the garage, clean up grease or oil spills. BATHROOM  Install night lights.  Install grab bars by the toilet and in the tub and shower.  Use non-skid mats or decals in the tub or shower.  Place a plastic non-slip stool in the shower to sit on, if needed.  Keep floors dry and clean up all water on the floor immediately.  Remove soap buildup in the tub or shower on a regular basis.  Secure bath mats with non-slip, double-sided rug tape.  Remove throw rugs and tripping hazards from the floors. BEDROOMS  Install night lights.  Make sure a bedside light is easy to reach.  Do not use oversized bedding.  Keep a telephone by your bedside.  Have a firm chair with side arms to use for getting dressed.  Remove throw rugs and tripping hazards from the floor. KITCHEN  Keep handles on pots and pans turned toward the center of the stove. Use back burners when possible.  Clean up spills quickly and allow time  for drying.  Avoid walking on wet floors.  Avoid hot utensils and knives.  Position shelves so they are not too high or low.  Place commonly used objects within easy reach.  If necessary, use a sturdy step stool with a grab bar when reaching.  Keep electrical cables out of the way.  Do not use floor polish or wax that makes floors slippery. If you must use wax, use non-skid floor wax.  Remove throw rugs and tripping hazards from the floor. STAIRWAYS  Never leave objects on stairs.  Place handrails on both sides of stairways and use them. Fix any loose handrails. Make sure handrails on both sides of the stairways are as long as the stairs.  Check carpeting to make sure it is firmly attached along stairs. Make repairs to worn or loose carpet promptly.  Avoid placing throw rugs at the top or bottom of stairways, or properly secure the rug with carpet tape to prevent slippage. Get rid of throw rugs, if possible.  Have an electrician put in a light switch at the top and bottom of the stairs. OTHER FALL PREVENTION TIPS  Wear low-heel or rubber-soled shoes that are supportive and fit well. Wear closed toe shoes.  When using a stepladder, make sure it is fully opened and both spreaders are firmly locked. Do not climb a closed stepladder.  Add color or  contrast paint or tape to grab bars and handrails in your home. Place contrasting color strips on first and last steps.  Learn and use mobility aids as needed. Install an electrical emergency response system.  Turn on lights to avoid dark areas. Replace light bulbs that burn out immediately. Get light switches that glow.  Arrange furniture to create clear pathways. Keep furniture in the same place.  Firmly attach carpet with non-skid or double-sided tape.  Eliminate uneven floor surfaces.  Select a carpet pattern that does not visually hide the edge of steps.  Be aware of all pets. OTHER HOME SAFETY TIPS  Set the water  temperature for 120 F (48.8 C).  Keep emergency numbers on or near the telephone.  Keep smoke detectors on every level of the home and near sleeping areas. Document Released: 02/18/2002 Document Revised: 08/30/2011 Document Reviewed: 05/20/2011 [facility] Patient Information 2015 [place], [place]. This information is not intended to replace advice given to you by your health care provider. Make sure you discuss any questions you have with your health care provider.    Preventive Care for Adults Ages 82 years and over  Blood pressure check.** / Every 1 to 2 years.  Lipid and cholesterol check.** / Every 5 years beginning at age 25 years.  Lung cancer screening. / Every year if you are aged 78-80 years and have a 30-pack-year history of smoking and currently smoke or have quit within the past 15 years. Yearly screening is stopped once you have quit smoking for at least 15 years or develop a health problem that would prevent you from having lung cancer treatment.  Clinical breast exam.** / Every year after age 68 years.  BRCA-related cancer risk assessment.** / For women who have family members with a BRCA-related cancer (breast, ovarian, tubal, or peritoneal cancers).  Mammogram.** / Every year beginning at age 43 years and continuing for as long as you are in good health. Consult with your health care provider.  Pap test.** / Every 3 years starting at age 76 years through age 63 or 27 years with 3 consecutive normal Pap tests. Testing can be stopped between 65 and 70 years with 3 consecutive normal Pap tests and no abnormal Pap or HPV tests in the past 10 years.  HPV screening.** / Every 3 years from ages 72 years through ages 54 or 44 years with a history of 3 consecutive normal Pap tests. Testing can be stopped between 65 and 70 years with 3 consecutive normal Pap tests and no abnormal Pap or HPV tests in the past 10 years.  Fecal occult blood test (FOBT) of stool. / Every year  beginning at age 55 years and continuing until age 29 years. You may not need to do this test if you get a colonoscopy every 10 years.  Flexible sigmoidoscopy or colonoscopy.** / Every 5 years for a flexible sigmoidoscopy or every 10 years for a colonoscopy beginning at age 68 years and continuing until age 19 years.  Hepatitis C blood test.** / For all people born from [birth year] through 1965 and any individual with known risks for hepatitis C.  Osteoporosis screening.** / A one-time screening for women ages 46 years and over and women at risk for fractures or osteoporosis.  Skin self-exam. / Monthly.  Influenza vaccine. / Every year.  Tetanus, diphtheria, and acellular pertussis (Tdap/Td) vaccine.** / 1 dose of Td every 10 years.  Varicella vaccine.** / Consult your health care provider.  Zoster vaccine.** / 1 dose  for adults aged 10 years or older.  Pneumococcal 13-valent conjugate (PCV13) vaccine.** / Consult your health care provider.  Pneumococcal polysaccharide (PPSV23) vaccine.** / 1 dose for all adults aged 42 years and older.  Meningococcal vaccine.** / Consult your health care provider.  Hepatitis A vaccine.** / Consult your health care provider.  Hepatitis B vaccine.** / Consult your health care provider.  Haemophilus influenzae type b (Hib) vaccine.** / Consult your health care provider. ** Family history and personal history of risk and conditions may change your health care provider's recommendations. Document Released: 04/26/2001 Document Revised: 07/15/2013 Document Reviewed: 07/26/2010 [facility] Patient Information 2015 [place], [place]. This information is not intended to replace advice given to you by your health care provider. Make sure you discuss any questions you have with your health care provider.

## 2014-03-26 NOTE — Progress Notes (Signed)
Pre visit review using our clinic review tool, if applicable. No additional management support is needed unless otherwise documented below in the visit note. 

## 2014-03-26 NOTE — Telephone Encounter (Signed)
Caller name:Zea, Lavinia Relation to VV:YXAJ Call back number:[phone] Pharmacy:[facility]  Reason for call: pt was seen today states she forgot to tell dr. Larose Kells she needed a rx for simvastatin (ZOCOR) 40 MG tablet

## 2014-03-26 NOTE — Assessment & Plan Note (Signed)
On no medications, BP has been satisfactory for long time, recommend to check BPs monthly

## 2014-03-26 NOTE — Assessment & Plan Note (Signed)
Has a number of orthopedic challenges including a recent back surgery, fortunately she is doing well, requiring no pain medication

## 2014-03-26 NOTE — Progress Notes (Signed)
Subjective:    Patient ID: Rebecca [name], female    DOB: June 10, 1940, 74 y.o.   MRN: [id number]  DOS:  03/26/2014 Type of visit - description :   Here for Medicare AWV:  1. Risk factors based on Past M, S, F history: yes   2. Physical Activities: s/p surgery, doing well, active within home, takes walks!   3. Depression/mood: (-) screening 4. Hearing: denies problems, no problems noted w/ normal conversation 5.ADL's: totally independent , drives (limited d/t surgery)  6. Fall Risk: prevention discussed   7. Home Safety: feels safe at home   8. Height, weight, &visual acuity: see VS, corrected w/  glasses , sees eye doctor regulalrly    9. Counseling: yes 10. Labs ordered based on risk factors: yes   11. Referral Coordination: if needed   12. Care Plan: see a/p   13. Cognitive Assessment: cognition, memory appropriate  14.Care team updated 15. Written plan provided   we also discussed the following: Hyperlipidemia, good compliance of medication Dysphaga, not an issue at this time Status post back surgery, recuperating well, she remains very optimistic and working hard on stay active.  ROS Denies chest pain, difficulty breathing, lower extremity edema No nausea, vomiting, diarrhea or blood in the stools No cough or sputum production No dysuria, gross hematuria difficulty urinating  Past Medical History  Diagnosis Date  . Hyperlipidemia   . Osteoporosis   . Chronic neck pain   . Hypertension     No longer takes med.  Marland Kitchen Dysphagia     esophageal stretched 9-14   . Complication of anesthesia     at one time my blood pressure dropped   . Arthritis     degenerative lumbar spine   . History of blood transfusion 2009    post - lumbar fusion     Past Surgical History  Procedure Laterality Date  . Cholecystectomy    . Abdominal hysterectomy    . Tonsillectomy    . Oophorectomy    . Shoulder surgery  2008    x 2/Dr. Collier Salina  . Back surgery  2009    Dr Saintclair Halsted  .  Neck surgery  05/2008    Dr Saintclair Halsted  . Knee surgery  9-11    right,  scope; left knee  . Arm surgery      transposed ulner nerve 10/14/08 (dr. Amedeo Plenty)  . Carpal tunnell  2006    bilateral  . Rhinoplasty  1980  . Partial knee arthroplasty  01/17/2012    LEFT KNEE  . Partial knee arthroplasty  01/17/2012    Procedure: UNICOMPARTMENTAL KNEE;  Surgeon: Johnny Bridge, MD;  Location: [facility];  Service: Orthopedics;  Laterality: Left;  . Back surgery  01-2014    Dr Saintclair Halsted    History   Social History  . Marital Status: Married    Spouse Name: N/A    Number of Children: 4  . Years of Education: N/A   Occupational History  . stay home     Social History Main Topics  . Smoking status: Never Smoker   . Smokeless tobacco: Never Used  . Alcohol Use: 0.0 oz/week    0 Glasses of wine per week     Comment: rare  . Drug Use: No  . Sexual Activity: Not Currently   Other Topics Concern  . Not on file   Social History Narrative   Husband has cancer    1 child lives in this area  Family History  Problem Relation Age of Onset  . Colon cancer Neg Hx   . Prostate cancer Father   . Diabetes Sister   . Heart disease Sister     sister and brothers x 2  . Kidney disease Sister   . Kidney disease Brother   . Breast cancer Neg Hx        Medication List       This list is accurate as of: 03/26/14  5:54 PM.  Always use your most recent med list.               aspirin 81 MG tablet  Take 81 mg by mouth daily.     calcium-vitamin D 250-125 MG-UNIT per tablet  Commonly known as:  OSCAL WITH D  Take 1 tablet by mouth 2 (two) times daily.     D3-1000 PO  Take 1,000 Units by mouth daily.     fish oil-omega-3 fatty acids 1000 MG capsule  Take 1 g by mouth 3 (three) times daily.     multivitamin tablet  Take 1 tablet by mouth daily.     multivitamin-lutein Caps capsule  Take 1 capsule by mouth daily.     simvastatin 40 MG tablet  Commonly known as:  ZOCOR  Take 1 tablet (40  mg total) by mouth every evening.           Objective:   Physical Exam BP 142/83 mmHg  Pulse 80  Temp(Src) 97.8 F (36.6 C) (Oral)  Ht 5' (1.524 m)  Wt 163 lb 4 oz (74.05 kg)  BMI 31.88 kg/m2  SpO2 100% General -- alert, well-developed, NAD.  Neck --no thyromegaly , normal carotid pulse  HEENT-- Not pale.  Lungs -- normal respiratory effort, no intercostal retractions, no accessory muscle use, and normal breath sounds.  Heart-- normal rate, regular rhythm, no murmur.  Abdomen-- not performed, has a back brace Extremities-- no pretibial edema bilaterally  Neurologic--  alert & oriented X3. Speech normal, strength symmetric and appropriate for age.  LE DTRs symmetric. Psych-- Cognition and judgment appear intact. Cooperative with normal attention span and concentration. No anxious or depressed appearing.        Assessment & Plan:

## 2014-03-26 NOTE — Telephone Encounter (Signed)
Simvastatin sent to [facility] as requested.

## 2014-03-26 NOTE — Assessment & Plan Note (Addendum)
TD around 2005 per pt,  pneumonia shot around 2002 and 2008 prevnar-- today shingles shot-- 12-2009 Had a flu shot  had a normal  colonoscopy at [facility] 2003, Cscope 2-14 nopolyps s/p hysterectomy for fibroids, no h/o abnormal PAPs, see previous notes, no further PAPs MMG 07-2013 neg , neg breast exam 12-2012---> discuss breast cancer screening next year +FH CAD, pt asx, on statins an ASA Doing well with lifestyle, praised

## 2014-03-28 NOTE — Telephone Encounter (Signed)
error 

## 2014-04-17 ENCOUNTER — Telehealth: Payer: Self-pay

## 2014-04-17 NOTE — Telephone Encounter (Signed)
UDS: 03/26/2014  UDS checked in error: Contacted [employer]: will not be charged for UDS.  Per Dr. Larose Kells 04/17/2014

## 2014-06-05 ENCOUNTER — Other Ambulatory Visit: Payer: Self-pay | Admitting: Neurosurgery

## 2014-06-05 DIAGNOSIS — M5023 Other cervical disc displacement, cervicothoracic region: Secondary | ICD-10-CM

## 2014-06-19 ENCOUNTER — Ambulatory Visit
Admission: RE | Admit: 2014-06-19 | Discharge: 2014-06-19 | Disposition: A | Discharge status: Home / Self Care | Payer: PPO | Source: Ambulatory Visit | Attending: Neurosurgery | Admitting: Neurosurgery

## 2014-06-19 DIAGNOSIS — M5023 Other cervical disc displacement, cervicothoracic region: Secondary | ICD-10-CM

## 2014-11-10 ENCOUNTER — Other Ambulatory Visit: Payer: Self-pay | Admitting: Orthopedic Surgery

## 2014-11-18 ENCOUNTER — Encounter (HOSPITAL_COMMUNITY): Payer: Self-pay

## 2014-11-18 ENCOUNTER — Encounter (HOSPITAL_COMMUNITY)
Admission: RE | Admit: 2014-11-18 | Discharge: 2014-11-18 | Disposition: A | Discharge status: Home / Self Care | Payer: PPO | Source: Ambulatory Visit | Attending: Orthopedic Surgery | Admitting: Orthopedic Surgery

## 2014-11-18 DIAGNOSIS — Z01818 Encounter for other preprocedural examination: Secondary | ICD-10-CM

## 2014-11-18 LAB — COMPREHENSIVE METABOLIC PANEL
ALBUMIN: 4 g/dL (ref 3.5–5.0)
ALT: 19 U/L (ref 14–54)
ANION GAP: 7 (ref 5–15)
AST: 28 U/L (ref 15–41)
Alkaline Phosphatase: 45 U/L (ref 38–126)
BUN: 14 mg/dL (ref 6–20)
CO2: 24 mmol/L (ref 22–32)
Calcium: 9.6 mg/dL (ref 8.9–10.3)
Chloride: 108 mmol/L (ref 101–111)
Creatinine, Ser: 0.99 mg/dL (ref 0.44–1.00)
GFR calc non Af Amer: 55 mL/min — ABNORMAL LOW (ref >60)
GLUCOSE: 86 mg/dL (ref 65–99)
POTASSIUM: 3.7 mmol/L (ref 3.5–5.1)
SODIUM: 139 mmol/L (ref 135–145)
Total Bilirubin: 0.5 mg/dL (ref 0.3–1.2)
Total Protein: 6.9 g/dL (ref 6.5–8.1)

## 2014-11-18 LAB — CBC WITH DIFFERENTIAL/PLATELET
Basophils Absolute: 0 10*3/uL (ref 0.0–0.1)
Basophils Relative: 0 % (ref 0–1)
Eosinophils Absolute: 0.2 10*3/uL (ref 0.0–0.7)
Eosinophils Relative: 3 % (ref 0–5)
HEMATOCRIT: 41.3 % (ref 36.0–46.0)
HEMOGLOBIN: 13.6 g/dL (ref 12.0–15.0)
Lymphocytes Relative: 30 % (ref 12–46)
Lymphs Abs: 2 10*3/uL (ref 0.7–4.0)
MCH: 30.2 pg (ref 26.0–34.0)
MCHC: 32.9 g/dL (ref 30.0–36.0)
MCV: 91.8 fL (ref 78.0–100.0)
MONO ABS: 0.6 10*3/uL (ref 0.1–1.0)
Monocytes Relative: 9 % (ref 3–12)
NEUTROS ABS: 3.8 10*3/uL (ref 1.7–7.7)
NEUTROS PCT: 58 % (ref 43–77)
Platelets: 151 10*3/uL (ref 150–400)
RBC: 4.5 MIL/uL (ref 3.87–5.11)
RDW: 13.8 % (ref 11.5–15.5)
WBC: 6.6 10*3/uL (ref 4.0–10.5)

## 2014-11-18 LAB — URINALYSIS, ROUTINE W REFLEX MICROSCOPIC
Bilirubin Urine: NEGATIVE
GLUCOSE, UA: NEGATIVE mg/dL
HGB URINE DIPSTICK: NEGATIVE
KETONES UR: NEGATIVE mg/dL
LEUKOCYTES UA: NEGATIVE
Nitrite: NEGATIVE
PROTEIN: NEGATIVE mg/dL
Specific Gravity, Urine: 1.016 (ref 1.005–1.030)
UROBILINOGEN UA: 0.2 mg/dL (ref 0.0–1.0)
pH: 5 (ref 5.0–8.0)

## 2014-11-18 LAB — PROTIME-INR
INR: 1.09 (ref 0.00–1.49)
Prothrombin Time: 14.3 seconds (ref 11.6–15.2)

## 2014-11-18 LAB — SURGICAL PCR SCREEN
MRSA, PCR: NEGATIVE
Staphylococcus aureus: NEGATIVE

## 2014-11-18 LAB — APTT: aPTT: 29 seconds (ref 24–37)

## 2014-11-18 NOTE — Pre-Procedure Instructions (Addendum)
Aniaya Bacha [facility]  11/18/2014      [facility] [address] [place] [place] [address] [place] [address] [place] [address] [place] [address] Phone: [phone] Fax: [phone]  [id number] [place], [address] [place] [address] Phone: [phone] Fax: [phone]  [place] [address] - [place] - [address] [address] Phone: [phone] Fax: [phone]    Your procedure is scheduled on 11/20/14  Report to [facility] short stay admitting at 930 A.M.  Call this number if you have problems the morning of surgery:  [phone]   Remember:  Do not eat food or drink liquids after midnight.  Take these medicines the morning of surgery with A SIP OF WATER none  STOP all herbel meds, nsaids (aleve,naproxen,advil,ibuprofen) NOW including aspirin, cal-vit D,fish oil, multi vit   Do not wear jewelry, make-up or nail polish.  Do not wear lotions, powders, or perfumes.  You may wear deodorant.  Do not shave 48 hours prior to surgery.  Men may shave face and neck.  Do not bring valuables to the hospital.  [facility] is not responsible for any belongings or valuables.  Contacts, dentures or bridgework may not be worn into surgery.  Leave your suitcase in the car.  After surgery it may be brought to your room.  For patients admitted to the hospital, discharge time will be determined by your treatment team.  Patients discharged the day of surgery will not be allowed to drive home.   Name and phone number of your driver:    Special instructions:  Special Instructions: [place] - Preparing for Surgery  Before surgery, you can play an important role.  Because skin is not sterile, your skin needs to be as free of germs as possible.  You can reduce the number of germs on you skin by washing with CHG (chlorahexidine gluconate) soap before  surgery.  CHG is an antiseptic cleaner which kills germs and bonds with the skin to continue killing germs even after washing.  Please DO NOT use if you have an allergy to CHG or antibacterial soaps.  If your skin becomes reddened/irritated stop using the CHG and inform your nurse when you arrive at Short Stay.  Do not shave (including legs and underarms) for at least 48 hours prior to the first CHG shower.  You may shave your face.  Please follow these instructions carefully:   1.  Shower with CHG Soap the night before surgery and the morning of Surgery.  2.  If you choose to wash your hair, wash your hair first as usual with your normal shampoo.  3.  After you shampoo, rinse your hair and body thoroughly to remove the Shampoo.  4.  Use CHG as you would any other liquid soap.  You can apply chg directly  to the skin and wash gently with scrungie or a clean washcloth.  5.  Apply the CHG Soap to your body ONLY FROM THE NECK DOWN.  Do not use on open wounds or open sores.  Avoid contact with your eyes ears, mouth and genitals (private parts).  Wash genitals (private parts)       with your normal soap.  6.  Wash thoroughly, paying special attention to the area where your surgery will be performed.  7.  Thoroughly rinse your body with warm water  from the neck down.  8.  DO NOT shower/wash with your normal soap after using and rinsing off the CHG Soap.  9.  Pat yourself dry with a clean towel.            10.  Wear clean pajamas.            11.  Place clean sheets on your bed the night of your first shower and do not sleep with pets.  Day of Surgery  Do not apply any lotions/deodorants the morning of surgery.  Please wear clean clothes to the hospital/surgery center.*  Please read over the following fact sheets that you were given. Pain Booklet, Coughing and Deep Breathing, MRSA Information and Surgical Site Infection Prevention

## 2014-11-19 NOTE — H&P (Signed)
PREOPERATIVE H&P  Chief Complaint: Left arm pain  HPI: Rebecca [name] is a 74 y.o. female who presents with ongoing pain in the left arm  MRI reveals stenosis C3-5, above patient's previous C5-7 ACDF  Patient has failed multiple forms of conservative care and continues to have pain (see office notes for additional details regarding the patient's full course of treatment)  Past Medical History  Diagnosis Date  . Hyperlipidemia   . Osteoporosis   . Chronic neck pain   . Hypertension     No longer takes med.  Marland Kitchen Dysphagia     esophageal stretched 9-14   . Arthritis     degenerative lumbar spine   . History of blood transfusion 2009    post - lumbar fusion   . Osteoarthritis of left shoulder region     Mild, Dr. Mardelle Matte  . Complication of anesthesia     at one time my blood pressure dropped -lumbar - on bp med at that time   Past Surgical History  Procedure Laterality Date  . Cholecystectomy    . Abdominal hysterectomy    . Tonsillectomy    . Oophorectomy Bilateral   . Shoulder surgery Bilateral     x 2/Dr. Collier Salina  . Back surgery  2009    Dr Saintclair Halsted  . Neck surgery  05/2008    Dr Saintclair Halsted  . Knee surgery  9-11    right,  scope; left knee  . Arm surgery Bilateral     transposed ulner nerve 10 (dr. Amedeo Plenty)  . Carpal tunnell  2006    bilateral  . Rhinoplasty  1980  . Partial knee arthroplasty  01/17/2012    LEFT KNEE  . Partial knee arthroplasty  01/17/2012    Procedure: UNICOMPARTMENTAL KNEE;  Surgeon: Johnny Bridge, MD;  Location: [facility];  Service: Orthopedics;  Laterality: Left;  . Back surgery  01-2014    Dr Saintclair Halsted   Social History   Social History  . Marital Status: Married    Spouse Name: N/A  . Number of Children: 4  . Years of Education: N/A   Occupational History  . stay home     Social History Main Topics  . Smoking status: Never Smoker   . Smokeless tobacco: Never Used  . Alcohol Use: 0.0 oz/week    0 Glasses of wine per week     Comment:  rare  . Drug Use: No  . Sexual Activity: Not Currently   Other Topics Concern  . Not on file   Social History Narrative   Husband has cancer    1 child lives in this area    Family History  Problem Relation Age of Onset  . Colon cancer Neg Hx   . Prostate cancer Father   . Diabetes Sister   . Heart disease Sister     sister and brothers x 2  . Kidney disease Sister   . Kidney disease Brother   . Breast cancer Neg Hx    Allergies  Allergen Reactions  . Tape Other (See Comments)    White adhesive tape -blisters  . Alendronate Sodium     REACTION: jaw pain  . Cortizone-10 [Hydrocortisone] Other (See Comments)    Pt received cortizone shot, and became very flushed, and hot, blisters, raw areas around lips & ears   . Oxycodone-Acetaminophen Hives  . Penicillins Swelling and Rash   Prior to Admission medications   Medication Sig Start Date End Date  Taking? Authorizing Provider  aspirin 81 MG tablet Take 81 mg by mouth daily.     Yes Historical Provider, MD  calcium-vitamin D (OSCAL WITH D) 250-125 MG-UNIT per tablet Take 1 tablet by mouth 2 (two) times daily.   Yes Historical Provider, MD  Cholecalciferol (D3-1000 PO) Take 1,000 Units by mouth daily.     Yes Historical Provider, MD  fish oil-omega-3 fatty acids 1000 MG capsule Take 1 g by mouth 3 (three) times daily.    Yes Historical Provider, MD  Multiple Vitamin (MULTIVITAMIN) tablet Take 1 tablet by mouth daily.   Yes Historical Provider, MD  multivitamin-lutein (OCUVITE-LUTEIN) CAPS capsule Take 1 capsule by mouth daily.   Yes Historical Provider, MD  simvastatin (ZOCOR) 40 MG tablet Take 1 tablet (40 mg total) by mouth every evening. 03/26/14 10/17/15 Yes Colon Branch, MD     All other systems have been reviewed and were otherwise negative with the exception of those mentioned in the HPI and as above.  Physical Exam: There were no vitals filed for this visit.  General: Alert, no acute distress Cardiovascular: No pedal  edema Respiratory: No cyanosis, no use of accessory musculature Skin: No lesions in the area of chief complaint Neurologic: Sensation intact distally Psychiatric: Patient is competent for consent with normal mood and affect Lymphatic: No axillary or cervical lymphadenopathy  MUSCULOSKELETAL: + spurling on L  Assessment/Plan: Left arm pain Plan for Procedure(s): ANTERIOR CERVICAL DECOMPRESSION/DISCECTOMY FUSION 2 LEVELS   Sinclair Ship, MD 11/19/2014 8:27 AM

## 2014-11-20 ENCOUNTER — Inpatient Hospital Stay (HOSPITAL_COMMUNITY): Payer: PPO | Admitting: Anesthesiology

## 2014-11-20 ENCOUNTER — Inpatient Hospital Stay (HOSPITAL_COMMUNITY)
Admission: RE | Admit: 2014-11-20 | Discharge: 2014-11-21 | DRG: 473 | Disposition: A | Discharge status: Home / Self Care | Payer: PPO | Source: Ambulatory Visit | Attending: Orthopedic Surgery | Admitting: Orthopedic Surgery

## 2014-11-20 ENCOUNTER — Encounter (HOSPITAL_COMMUNITY)
Admission: RE | Disposition: A | Discharge status: Home / Self Care | Payer: PPO | Source: Ambulatory Visit | Attending: Orthopedic Surgery

## 2014-11-20 ENCOUNTER — Encounter (HOSPITAL_COMMUNITY): Payer: Self-pay | Admitting: Certified Registered"

## 2014-11-20 ENCOUNTER — Inpatient Hospital Stay (HOSPITAL_COMMUNITY): Payer: PPO

## 2014-11-20 DIAGNOSIS — E785 Hyperlipidemia, unspecified: Secondary | ICD-10-CM | POA: Diagnosis present

## 2014-11-20 DIAGNOSIS — M4802 Spinal stenosis, cervical region: Principal | ICD-10-CM | POA: Diagnosis present

## 2014-11-20 DIAGNOSIS — M19012 Primary osteoarthritis, left shoulder: Secondary | ICD-10-CM | POA: Diagnosis present

## 2014-11-20 DIAGNOSIS — Z8249 Family history of ischemic heart disease and other diseases of the circulatory system: Secondary | ICD-10-CM

## 2014-11-20 DIAGNOSIS — Z833 Family history of diabetes mellitus: Secondary | ICD-10-CM | POA: Diagnosis not present

## 2014-11-20 DIAGNOSIS — M5410 Radiculopathy, site unspecified: Secondary | ICD-10-CM | POA: Diagnosis present

## 2014-11-20 DIAGNOSIS — Z880 Allergy status to penicillin: Secondary | ICD-10-CM | POA: Diagnosis not present

## 2014-11-20 DIAGNOSIS — I10 Essential (primary) hypertension: Secondary | ICD-10-CM | POA: Diagnosis present

## 2014-11-20 DIAGNOSIS — Z91048 Other nonmedicinal substance allergy status: Secondary | ICD-10-CM | POA: Diagnosis not present

## 2014-11-20 DIAGNOSIS — M810 Age-related osteoporosis without current pathological fracture: Secondary | ICD-10-CM | POA: Diagnosis present

## 2014-11-20 DIAGNOSIS — M5412 Radiculopathy, cervical region: Secondary | ICD-10-CM | POA: Diagnosis present

## 2014-11-20 DIAGNOSIS — Z888 Allergy status to other drugs, medicaments and biological substances status: Secondary | ICD-10-CM | POA: Diagnosis not present

## 2014-11-20 DIAGNOSIS — Z419 Encounter for procedure for purposes other than remedying health state, unspecified: Secondary | ICD-10-CM

## 2014-11-20 DIAGNOSIS — Z7982 Long term (current) use of aspirin: Secondary | ICD-10-CM | POA: Diagnosis not present

## 2014-11-20 DIAGNOSIS — Z96652 Presence of left artificial knee joint: Secondary | ICD-10-CM | POA: Diagnosis present

## 2014-11-20 DIAGNOSIS — M79602 Pain in left arm: Secondary | ICD-10-CM | POA: Diagnosis present

## 2014-11-20 HISTORY — PX: ANTERIOR CERVICAL DECOMP/DISCECTOMY FUSION: SHX1161

## 2014-11-20 SURGERY — ANTERIOR CERVICAL DECOMPRESSION/DISCECTOMY FUSION 2 LEVELS
Anesthesia: General | Site: Spine Cervical

## 2014-11-20 MED ORDER — METOPROLOL TARTRATE 1 MG/ML IV SOLN
INTRAVENOUS | Status: DC | PRN
  Administered 2014-11-20: 5 mg via INTRAVENOUS

## 2014-11-20 MED ORDER — ZOLPIDEM TARTRATE 5 MG PO TABS: 5.0000 mg | ORAL_TABLET | Freq: Every evening | ORAL | Status: DC | PRN

## 2014-11-20 MED ORDER — MINERAL OIL LIGHT 100 % EX OIL
TOPICAL_OIL | CUTANEOUS | Status: AC
  Filled 2014-11-20: qty 25

## 2014-11-20 MED ORDER — SODIUM CHLORIDE 0.9 % IJ SOLN: 3.0000 mL | Freq: Two times a day (BID) | INTRAMUSCULAR | Status: DC

## 2014-11-20 MED ORDER — SIMVASTATIN 40 MG PO TABS
40.0000 mg | ORAL_TABLET | Freq: Every evening | ORAL | Status: DC
  Administered 2014-11-20: 40 mg via ORAL
  Filled 2014-11-20: qty 1

## 2014-11-20 MED ORDER — EPHEDRINE SULFATE 50 MG/ML IJ SOLN
INTRAMUSCULAR | Status: DC | PRN
Start: 2014-11-20 — End: 2014-11-20
  Administered 2014-11-20: 5 mg via INTRAVENOUS
  Administered 2014-11-20: 20 mg via INTRAVENOUS

## 2014-11-20 MED ORDER — 0.9 % SODIUM CHLORIDE (POUR BTL) OPTIME
TOPICAL | Status: DC | PRN
  Administered 2014-11-20: 1000 mL

## 2014-11-20 MED ORDER — DOCUSATE SODIUM 100 MG PO CAPS
100.0000 mg | ORAL_CAPSULE | Freq: Two times a day (BID) | ORAL | Status: DC
  Administered 2014-11-20 – 2014-11-21 (×2): 100 mg via ORAL
  Filled 2014-11-20 (×2): qty 1

## 2014-11-20 MED ORDER — FENTANYL CITRATE (PF) 100 MCG/2ML IJ SOLN
25.0000 ug | INTRAMUSCULAR | Status: DC | PRN
  Administered 2014-11-20: 50 ug via INTRAVENOUS

## 2014-11-20 MED ORDER — HYDROMORPHONE HCL 2 MG PO TABS
1.0000 mg | ORAL_TABLET | ORAL | Status: DC | PRN
  Administered 2014-11-20 – 2014-11-21 (×4): 1 mg via ORAL
  Filled 2014-11-20 (×3): qty 1

## 2014-11-20 MED ORDER — BISACODYL 5 MG PO TBEC: 5.0000 mg | DELAYED_RELEASE_TABLET | Freq: Every day | ORAL | Status: DC | PRN

## 2014-11-20 MED ORDER — HYDROMORPHONE HCL 1 MG/ML IJ SOLN
INTRAMUSCULAR | Status: AC
  Filled 2014-11-20: qty 1

## 2014-11-20 MED ORDER — VANCOMYCIN HCL IN DEXTROSE 1-5 GM/200ML-% IV SOLN
1000.0000 mg | INTRAVENOUS | Status: AC
  Administered 2014-11-20: 1000 mg via INTRAVENOUS

## 2014-11-20 MED ORDER — ACETAMINOPHEN 325 MG PO TABS: 650.0000 mg | ORAL_TABLET | ORAL | Status: DC | PRN

## 2014-11-20 MED ORDER — ROCURONIUM BROMIDE 100 MG/10ML IV SOLN
INTRAVENOUS | Status: DC | PRN
  Administered 2014-11-20 (×2): 20 mg via INTRAVENOUS

## 2014-11-20 MED ORDER — FENTANYL CITRATE (PF) 100 MCG/2ML IJ SOLN
INTRAMUSCULAR | Status: DC | PRN
  Administered 2014-11-20: 250 ug via INTRAVENOUS
  Administered 2014-11-20: 150 ug via INTRAVENOUS
  Administered 2014-11-20: 100 ug via INTRAVENOUS

## 2014-11-20 MED ORDER — LIDOCAINE HCL (CARDIAC) 20 MG/ML IV SOLN: INTRAVENOUS | Status: DC | PRN

## 2014-11-20 MED ORDER — FLEET ENEMA 7-19 GM/118ML RE ENEM: 1.0000 | ENEMA | Freq: Once | RECTAL | Status: DC | PRN

## 2014-11-20 MED ORDER — ONDANSETRON HCL 4 MG/2ML IJ SOLN: 4.0000 mg | INTRAMUSCULAR | Status: DC | PRN

## 2014-11-20 MED ORDER — THROMBIN 20000 UNITS EX KIT
PACK | CUTANEOUS | Status: DC | PRN
  Administered 2014-11-20: 20 mL via TOPICAL

## 2014-11-20 MED ORDER — SODIUM CHLORIDE 0.9 % IJ SOLN: 3.0000 mL | INTRAMUSCULAR | Status: DC | PRN

## 2014-11-20 MED ORDER — ACETAMINOPHEN 650 MG RE SUPP: 650.0000 mg | RECTAL | Status: DC | PRN

## 2014-11-20 MED ORDER — VITAMIN D3 25 MCG (1000 UNIT) PO TABS
1000.0000 [IU] | ORAL_TABLET | Freq: Every day | ORAL | Status: DC
  Administered 2014-11-20 – 2014-11-21 (×2): 1000 [IU] via ORAL
  Filled 2014-11-20 (×4): qty 1

## 2014-11-20 MED ORDER — VANCOMYCIN HCL IN DEXTROSE 1-5 GM/200ML-% IV SOLN
1000.0000 mg | Freq: Once | INTRAVENOUS | Status: AC
  Administered 2014-11-21: 1000 mg via INTRAVENOUS
  Filled 2014-11-20: qty 200

## 2014-11-20 MED ORDER — ALUM & MAG HYDROXIDE-SIMETH 200-200-20 MG/5ML PO SUSP
30.0000 mL | Freq: Four times a day (QID) | ORAL | Status: DC | PRN
Start: 2014-11-20 — End: 2014-11-21

## 2014-11-20 MED ORDER — VANCOMYCIN HCL IN DEXTROSE 1-5 GM/200ML-% IV SOLN
INTRAVENOUS | Status: AC
  Filled 2014-11-20: qty 200

## 2014-11-20 MED ORDER — SODIUM CHLORIDE 0.9 % IV SOLN
250.0000 mL | INTRAVENOUS | Status: DC
  Administered 2014-11-21: 250 mL via INTRAVENOUS

## 2014-11-20 MED ORDER — LIDOCAINE HCL (CARDIAC) 20 MG/ML IV SOLN
INTRAVENOUS | Status: DC | PRN
  Administered 2014-11-20: 60 mg via INTRAVENOUS

## 2014-11-20 MED ORDER — DIAZEPAM 5 MG PO TABS: 5.0000 mg | ORAL_TABLET | Freq: Four times a day (QID) | ORAL | Status: DC | PRN

## 2014-11-20 MED ORDER — FENTANYL CITRATE (PF) 250 MCG/5ML IJ SOLN
INTRAMUSCULAR | Status: AC
  Filled 2014-11-20: qty 5

## 2014-11-20 MED ORDER — PHENYLEPHRINE HCL 10 MG/ML IJ SOLN
INTRAMUSCULAR | Status: DC | PRN
  Administered 2014-11-20: 80 ug via INTRAVENOUS

## 2014-11-20 MED ORDER — HYDROMORPHONE HCL 1 MG/ML IJ SOLN
INTRAMUSCULAR | Status: DC | PRN
  Administered 2014-11-20 (×5): .2 mg via INTRAVENOUS

## 2014-11-20 MED ORDER — PHENOL 1.4 % MT LIQD: 1.0000 | OROMUCOSAL | Status: DC | PRN

## 2014-11-20 MED ORDER — HYDROMORPHONE HCL 1 MG/ML IJ SOLN: 0.5000 mg | INTRAMUSCULAR | Status: DC | PRN

## 2014-11-20 MED ORDER — BUPIVACAINE-EPINEPHRINE 0.25% -1:200000 IJ SOLN
INTRAMUSCULAR | Status: DC | PRN
  Administered 2014-11-20: 1.5 mL

## 2014-11-20 MED ORDER — SUCCINYLCHOLINE CHLORIDE 20 MG/ML IJ SOLN
INTRAMUSCULAR | Status: DC | PRN
  Administered 2014-11-20: 100 mg via INTRAVENOUS

## 2014-11-20 MED ORDER — LACTATED RINGERS IV SOLN
INTRAVENOUS | Status: DC
  Administered 2014-11-20 (×2): via INTRAVENOUS

## 2014-11-20 MED ORDER — OCUVITE-LUTEIN PO CAPS
1.0000 | ORAL_CAPSULE | Freq: Every day | ORAL | Status: DC
  Administered 2014-11-20: 1 via ORAL
  Filled 2014-11-20 (×2): qty 1

## 2014-11-20 MED ORDER — ONDANSETRON HCL 4 MG/2ML IJ SOLN: 4.0000 mg | Freq: Once | INTRAMUSCULAR | Status: DC | PRN

## 2014-11-20 MED ORDER — PROPOFOL 10 MG/ML IV BOLUS
INTRAVENOUS | Status: AC
  Filled 2014-11-20: qty 20

## 2014-11-20 MED ORDER — FENTANYL CITRATE (PF) 100 MCG/2ML IJ SOLN
INTRAMUSCULAR | Status: AC
  Filled 2014-11-20: qty 2

## 2014-11-20 MED ORDER — THROMBIN 20000 UNITS EX SOLR
CUTANEOUS | Status: AC
  Filled 2014-11-20: qty 20000

## 2014-11-20 MED ORDER — HYDROMORPHONE HCL 2 MG PO TABS
ORAL_TABLET | ORAL | Status: AC
  Filled 2014-11-20: qty 1

## 2014-11-20 MED ORDER — SENNOSIDES-DOCUSATE SODIUM 8.6-50 MG PO TABS
1.0000 | ORAL_TABLET | Freq: Every evening | ORAL | Status: DC | PRN
Start: 2014-11-20 — End: 2014-11-21

## 2014-11-20 MED ORDER — CALCIUM CARBONATE-VITAMIN D 500-200 MG-UNIT PO TABS
1.0000 | ORAL_TABLET | Freq: Every day | ORAL | Status: DC
  Administered 2014-11-20 – 2014-11-21 (×2): 1 via ORAL
  Filled 2014-11-20 (×2): qty 1

## 2014-11-20 MED ORDER — PROPOFOL 10 MG/ML IV BOLUS
INTRAVENOUS | Status: DC | PRN
  Administered 2014-11-20: 120 mg via INTRAVENOUS

## 2014-11-20 MED ORDER — POVIDONE-IODINE 7.5 % EX SOLN
Freq: Once | CUTANEOUS | Status: DC
  Filled 2014-11-20: qty 118

## 2014-11-20 MED ORDER — MENTHOL 3 MG MT LOZG
1.0000 | LOZENGE | OROMUCOSAL | Status: DC | PRN
Start: 2014-11-20 — End: 2014-11-21

## 2014-11-20 MED ORDER — DEXAMETHASONE SODIUM PHOSPHATE 4 MG/ML IJ SOLN
INTRAMUSCULAR | Status: DC | PRN
Start: 2014-11-20 — End: 2014-11-20
  Administered 2014-11-20 (×2): 4 mg via INTRAVENOUS

## 2014-11-20 MED ORDER — ONDANSETRON HCL 4 MG/2ML IJ SOLN
INTRAMUSCULAR | Status: DC | PRN
  Administered 2014-11-20: 8 mg via INTRAVENOUS

## 2014-11-20 MED ORDER — KETOROLAC TROMETHAMINE 30 MG/ML IJ SOLN
INTRAMUSCULAR | Status: DC | PRN
  Administered 2014-11-20: 30 mg via INTRAVENOUS

## 2014-11-20 MED ORDER — BUPIVACAINE-EPINEPHRINE (PF) 0.25% -1:200000 IJ SOLN
INTRAMUSCULAR | Status: AC
  Filled 2014-11-20: qty 30

## 2014-11-20 SURGICAL SUPPLY — 79 items
APL SKNCLS STERI-STRIP NONHPOA (GAUZE/BANDAGES/DRESSINGS) ×1
BENZOIN TINCTURE PRP APPL 2/3 (GAUZE/BANDAGES/DRESSINGS) ×3 IMPLANT
BIT DRILL NEURO 2X3.1 SFT TUCH (MISCELLANEOUS) ×1 IMPLANT
BIT DRILL SRG 14X2.2XFLT CHK (BIT) IMPLANT
BIT DRL SRG 14X2.2XFLT CHK (BIT) ×1
BLADE SURG 15 STRL LF DISP TIS (BLADE) ×1 IMPLANT
BLADE SURG 15 STRL SS (BLADE)
BLADE SURG ROTATE 9660 (MISCELLANEOUS) ×3 IMPLANT
BUR MATCHSTICK NEURO 3.0 LAGG (BURR) IMPLANT
CARTRIDGE OIL MAESTRO DRILL (MISCELLANEOUS) ×1 IMPLANT
CLOSURE WOUND 1/2 X4 (GAUZE/BANDAGES/DRESSINGS) ×1
COLLAR CERV LO CONTOUR FIRM DE (SOFTGOODS) IMPLANT
CONT SPEC 4OZ CLIKSEAL STRL BL (MISCELLANEOUS) ×2 IMPLANT
CORDS BIPOLAR (ELECTRODE) ×3 IMPLANT
COVER SURGICAL LIGHT HANDLE (MISCELLANEOUS) ×3 IMPLANT
CRADLE DONUT ADULT HEAD (MISCELLANEOUS) ×3 IMPLANT
DECANTER SPIKE VIAL GLASS SM (MISCELLANEOUS) ×1 IMPLANT
DIFFUSER DRILL AIR PNEUMATIC (MISCELLANEOUS) ×3 IMPLANT
DRAIN JACKSON RD 7FR 3/32 (WOUND CARE) IMPLANT
DRAPE C-ARM 42X72 X-RAY (DRAPES) ×3 IMPLANT
DRAPE POUCH INSTRU U-SHP 10X18 (DRAPES) ×3 IMPLANT
DRAPE SURG 17X23 STRL (DRAPES) ×11 IMPLANT
DRILL BIT SKYLINE 14MM (BIT) ×3
DRILL NEURO 2X3.1 SOFT TOUCH (MISCELLANEOUS) ×3
DURAPREP 26ML APPLICATOR (WOUND CARE) ×3 IMPLANT
ELECT COATED BLADE 2.86 ST (ELECTRODE) ×3 IMPLANT
ELECT REM PT RETURN 9FT ADLT (ELECTROSURGICAL) ×3
ELECTRODE REM PT RTRN 9FT ADLT (ELECTROSURGICAL) ×1 IMPLANT
EVACUATOR SILICONE 100CC (DRAIN) IMPLANT
GAUZE SPONGE 4X4 12PLY STRL (GAUZE/BANDAGES/DRESSINGS) ×3 IMPLANT
GAUZE SPONGE 4X4 16PLY XRAY LF (GAUZE/BANDAGES/DRESSINGS) ×3 IMPLANT
GLOVE BIO SURGEON STRL SZ 6.5 (GLOVE) ×1 IMPLANT
GLOVE BIO SURGEON STRL SZ7 (GLOVE) ×5 IMPLANT
GLOVE BIO SURGEON STRL SZ8 (GLOVE) ×3 IMPLANT
GLOVE BIO SURGEONS STRL SZ 6.5 (GLOVE) ×1
GLOVE BIOGEL PI IND STRL 6.5 (GLOVE) IMPLANT
GLOVE BIOGEL PI IND STRL 7.5 (GLOVE) ×2 IMPLANT
GLOVE BIOGEL PI IND STRL 8 (GLOVE) ×1 IMPLANT
GLOVE BIOGEL PI INDICATOR 6.5 (GLOVE) ×2
GLOVE BIOGEL PI INDICATOR 7.5 (GLOVE) ×4
GLOVE BIOGEL PI INDICATOR 8 (GLOVE) ×2
GOWN STRL REUS W/ TWL LRG LVL3 (GOWN DISPOSABLE) ×1 IMPLANT
GOWN STRL REUS W/ TWL XL LVL3 (GOWN DISPOSABLE) ×1 IMPLANT
GOWN STRL REUS W/TWL LRG LVL3 (GOWN DISPOSABLE) ×9
GOWN STRL REUS W/TWL XL LVL3 (GOWN DISPOSABLE) ×3
INTERLOCK LRDTC CRVCL VBR 7MM (Bone Implant) IMPLANT
IV CATH 14GX2 1/4 (CATHETERS) ×3 IMPLANT
KIT BASIN OR (CUSTOM PROCEDURE TRAY) ×3 IMPLANT
KIT ROOM TURNOVER OR (KITS) ×3 IMPLANT
LORDOTIC CERVICAL VBR 7MM SM (Bone Implant) ×6 IMPLANT
NDL SPNL 20GX3.5 QUINCKE YW (NEEDLE) ×1 IMPLANT
NEEDLE 27GAX1X1/2 (NEEDLE) ×3 IMPLANT
NEEDLE SPNL 20GX3.5 QUINCKE YW (NEEDLE) ×3 IMPLANT
NS IRRIG 1000ML POUR BTL (IV SOLUTION) ×3 IMPLANT
OIL CARTRIDGE MAESTRO DRILL (MISCELLANEOUS) ×3
PACK ORTHO CERVICAL (CUSTOM PROCEDURE TRAY) ×3 IMPLANT
PAD ARMBOARD 7.5X6 YLW CONV (MISCELLANEOUS) ×6 IMPLANT
PATTIES SURGICAL .5 X.5 (GAUZE/BANDAGES/DRESSINGS) IMPLANT
PATTIES SURGICAL .5 X1 (DISPOSABLE) IMPLANT
PIN DISTRACTION 14 (PIN) ×4 IMPLANT
PLATE SKYLINE TWO LEVEL 32MM (Plate) ×2 IMPLANT
PUTTY BONE DBX 2.5 MIS (Bone Implant) ×2 IMPLANT
SCREW SKYLINE VAR OS 14MM (Screw) ×6 IMPLANT
SCREW VAR SELF TAP SKYLINE 14M (Screw) ×6 IMPLANT
SPONGE INTESTINAL PEANUT (DISPOSABLE) ×3 IMPLANT
SPONGE SURGIFOAM ABS GEL 100 (HEMOSTASIS) ×3 IMPLANT
STRIP CLOSURE SKIN 1/2X4 (GAUZE/BANDAGES/DRESSINGS) ×2 IMPLANT
SURGIFLO W/THROMBIN 8M KIT (HEMOSTASIS) IMPLANT
SUT MNCRL AB 4-0 PS2 18 (SUTURE) ×3 IMPLANT
SUT SILK 4 0 (SUTURE)
SUT SILK 4-0 18XBRD TIE 12 (SUTURE) IMPLANT
SUT VIC AB 2-0 CT2 18 VCP726D (SUTURE) ×3 IMPLANT
SYR BULB IRRIGATION 50ML (SYRINGE) ×3 IMPLANT
SYR CONTROL 10ML LL (SYRINGE) ×6 IMPLANT
TAPE CLOTH 4X10 WHT NS (GAUZE/BANDAGES/DRESSINGS) ×1 IMPLANT
TAPE UMBILICAL COTTON 1/8X30 (MISCELLANEOUS) ×3 IMPLANT
TOWEL OR 17X24 6PK STRL BLUE (TOWEL DISPOSABLE) ×3 IMPLANT
TOWEL OR 17X26 10 PK STRL BLUE (TOWEL DISPOSABLE) ×3 IMPLANT
YANKAUER SUCT BULB TIP NO VENT (SUCTIONS) ×3 IMPLANT

## 2014-11-20 NOTE — Anesthesia Preprocedure Evaluation (Addendum)
Anesthesia Evaluation  Patient identified by MRN, date of birth, ID band Patient awake    Reviewed: Allergy & Precautions, NPO status , Patient's Chart, lab work & pertinent test results  History of Anesthesia Complications (+) history of anesthetic complications (low BP with spinal)  Airway Mallampati: II  TM Distance: >3 FB Neck ROM: Limited    Dental  (+) Dental Advisory Given, Upper Dentures, Partial Lower   Pulmonary neg pulmonary ROS,    Pulmonary exam normal breath sounds clear to auscultation       Cardiovascular Exercise Tolerance: Good hypertension (currently not requiring medications), (-) angina(-) Past MI negative cardio ROS Normal cardiovascular exam Rhythm:Regular Rate:Normal     Neuro/Psych negative neurological ROS     GI/Hepatic negative GI ROS, Neg liver ROS, neg GERD  ,  Endo/Other  Obesity   Renal/GU negative Renal ROS     Musculoskeletal  (+) Arthritis , Osteoarthritis,    Abdominal   Peds  Hematology negative hematology ROS (+)   Anesthesia Other Findings Day of surgery medications reviewed with the patient.  Reproductive/Obstetrics                            Anesthesia Physical Anesthesia Plan  ASA: II  Anesthesia Plan: General   Post-op Pain Management:    Induction: Intravenous  Airway Management Planned: Oral ETT and Video Laryngoscope Planned  Additional Equipment:   Intra-op Plan:   Post-operative Plan: Extubation in OR  Informed Consent: I have reviewed the patients History and Physical, chart, labs and discussed the procedure including the risks, benefits and alternatives for the proposed anesthesia with the patient or authorized representative who has indicated his/her understanding and acceptance.   Dental advisory given  Plan Discussed with: CRNA  Anesthesia Plan Comments: (Risks/benefits of general anesthesia discussed with patient  including risk of damage to teeth, lips, gum, and tongue, nausea/vomiting, allergic reactions to medications, and the possibility of heart attack, stroke and death.  All patient questions answered.  Patient wishes to proceed.)        Anesthesia Quick Evaluation

## 2014-11-20 NOTE — Progress Notes (Signed)
ANTIBIOTIC CONSULT NOTE - INITIAL  Pharmacy Consult for Vancomycin Indication: surgical prophylaxis  Allergies  Allergen Reactions  . Tape Other (See Comments)    White adhesive tape -blisters  . Alendronate Sodium     REACTION: jaw pain  . Cortizone-10 [Hydrocortisone] Other (See Comments)    Pt received cortizone shot, and became very flushed, and hot, blisters, raw areas around lips & ears   . Oxycodone-Acetaminophen Hives  . Penicillins Swelling and Rash    Patient Measurements: Weight: 174 lb (78.926 kg) Adjusted Body Weight:   Vital Signs: Temp: 98.2 F (36.8 C) (09/08 1650) Temp Source: Oral (09/08 0925) BP: 122/60 mmHg (09/08 1845) Pulse Rate: 92 (09/08 1845) Intake/Output from previous day:   Intake/Output from this shift: Total I/O In: 750 [I.V.:750] Out: 75 [Blood:75]  Labs:  Recent Labs  11/18/14 1309  WBC 6.6  HGB 13.6  PLT 151  CREATININE 0.99   Estimated Creatinine Clearance: 46.4 mL/min (by C-G formula based on Cr of 0.99). No results for input(s): VANCOTROUGH, VANCOPEAK, VANCORANDOM, GENTTROUGH, GENTPEAK, GENTRANDOM, TOBRATROUGH, TOBRAPEAK, TOBRARND, AMIKACINPEAK, AMIKACINTROU, AMIKACIN in the last 72 hours.   Microbiology: Recent Results (from the past 720 hour(s))  Surgical pcr screen     Status: None   Collection Time: 11/18/14  1:09 PM  Result Value Ref Range Status   MRSA, PCR NEGATIVE NEGATIVE Final   Staphylococcus aureus NEGATIVE NEGATIVE Final    Comment:        The Xpert SA Assay (FDA approved for NASAL specimens in patients over 95 years of age), is one component of a comprehensive surveillance program.  Test performance has been validated by [facility] for patients greater than or equal to 40 year old. It is not intended to diagnose infection nor to guide or monitor treatment.     Medical History: Past Medical History  Diagnosis Date  . Hyperlipidemia   . Osteoporosis   . Chronic neck pain   . Hypertension      No longer takes med.  Marland Kitchen Dysphagia     esophageal stretched 9-14   . Arthritis     degenerative lumbar spine   . History of blood transfusion 2009    post - lumbar fusion   . Osteoarthritis of left shoulder region     Mild, Dr. Mardelle Matte  . Complication of anesthesia     at one time my blood pressure dropped -lumbar - on bp med at that time    Medications:  Prescriptions prior to admission  Medication Sig Dispense Refill Last Dose  . aspirin 81 MG tablet Take 81 mg by mouth daily.     Past Week at Unknown time  . calcium-vitamin D (OSCAL WITH D) 250-125 MG-UNIT per tablet Take 1 tablet by mouth 2 (two) times daily.   Past Week at Unknown time  . Cholecalciferol (D3-1000 PO) Take 1,000 Units by mouth daily.     Past Week at Unknown time  . fish oil-omega-3 fatty acids 1000 MG capsule Take 1 g by mouth 3 (three) times daily.    Past Week at Unknown time  . Multiple Vitamin (MULTIVITAMIN) tablet Take 1 tablet by mouth daily.   Past Week at Unknown time  . multivitamin-lutein (OCUVITE-LUTEIN) CAPS capsule Take 1 capsule by mouth daily.   Past Week at Unknown time  . simvastatin (ZOCOR) 40 MG tablet Take 1 tablet (40 mg total) by mouth every evening. 90 tablet 2 11/19/2014 at Unknown time   Scheduled:  . calcium-vitamin D  1 tablet Oral BID  . Cholecalciferol  1,000 Units Oral Daily  . docusate sodium  100 mg Oral BID  . fentaNYL      . HYDROmorphone      . multivitamin-lutein  1 capsule Oral Daily  . simvastatin  40 mg Oral QPM  . sodium chloride  3 mL Intravenous Q12H  . vancomycin       Infusions:  . sodium chloride     Assessment: 74yo female here for spine surgery. Pharmacy is consulted to dose vancomycin for surgical prophylaxis. Pt does not have a drain in place and will only need one dose.  Goal of Therapy:  Prevention of infection  Plan:  Vancomycin 1g IV once 12h post-op  Pharmacy will sign off. Please re-consult if needed.  Andrey Cota. Diona Foley, PharmD Clinical  Pharmacist Pager [phone] 11/20/2014,6:54 PM

## 2014-11-20 NOTE — Anesthesia Postprocedure Evaluation (Signed)
  Anesthesia Post-op Note  Patient: Rebecca [name] [facility]  Procedure(s) Performed: Procedure(s) with comments: ANTERIOR CERVICAL DECOMPRESSION/DISCECTOMY FUSION 2 LEVELS (N/A) - Anterior cervical decompression fusion, cervical 3-4, cervical 4-5 with instrumentation and allograft  Patient Location: PACU  Anesthesia Type:General  Level of Consciousness: awake, alert , oriented, patient cooperative and responds to stimulation  Airway and Oxygen Therapy: Patient Spontanous Breathing and Patient connected to nasal cannula oxygen  Post-op Pain: mild  Post-op Assessment: Post-op Vital signs reviewed, Patient's Cardiovascular Status Stable, Respiratory Function Stable, Patent Airway, No signs of Nausea or vomiting and Pain level controlled   LLE Sensation: No numbness, Full sensation   RLE Sensation: No numbness, Full sensation      Post-op Vital Signs: Reviewed and stable  Last Vitals:  Filed Vitals:   11/20/14 1800  BP: 146/71  Pulse: 93  Temp:   Resp: 19    Complications: No apparent anesthesia complications

## 2014-11-20 NOTE — Transfer of Care (Signed)
Immediate Anesthesia Transfer of Care Note  Patient: Rebecca [name] [facility]  Procedure(s) Performed: Procedure(s) with comments: ANTERIOR CERVICAL DECOMPRESSION/DISCECTOMY FUSION 2 LEVELS (N/A) - Anterior cervical decompression fusion, cervical 3-4, cervical 4-5 with instrumentation and allograft  Patient Location: PACU  Anesthesia Type:General  Level of Consciousness: awake, alert , oriented and patient cooperative  Airway & Oxygen Therapy: Patient Spontanous Breathing and Patient connected to nasal cannula oxygen  Post-op Assessment: Report given to RN, Post -op Vital signs reviewed and stable and Patient moving all extremities  Post vital signs: Reviewed and stable  Last Vitals:  Filed Vitals:   11/20/14 0925  BP: 165/61  Pulse: 64  Temp: 36.8 C  Resp: 18    Complications: No apparent anesthesia complications

## 2014-11-20 NOTE — Anesthesia Procedure Notes (Signed)
Procedure Name: Intubation Date/Time: 11/20/2014 1:58 PM Performed by: Duke Salvia Pre-anesthesia Checklist: Patient identified, Emergency Drugs available, Suction available and Patient being monitored Patient Re-evaluated:Patient Re-evaluated prior to inductionOxygen Delivery Method: Circle system utilized Preoxygenation: Pre-oxygenation with 100% oxygen Intubation Type: IV induction Ventilation: Mask ventilation without difficulty Laryngoscope Size: Glidescope Grade View: Grade I Number of attempts: 1 Airway Equipment and Method: Lighted stylet and Video-laryngoscopy Placement Confirmation: positive ETCO2,  breath sounds checked- equal and bilateral and ETT inserted through vocal cords under direct vision Secured at: 20 cm Tube secured with: Tape Dental Injury: Teeth and Oropharynx as per pre-operative assessment  Comments: Intubation by Dortha Kern, RN training for airway experience

## 2014-11-21 ENCOUNTER — Encounter (HOSPITAL_COMMUNITY): Payer: Self-pay | Admitting: Orthopedic Surgery

## 2014-11-21 MED FILL — Thrombin For Soln 20000 Unit: CUTANEOUS | Qty: 1 | Status: AC

## 2014-11-21 NOTE — Op Note (Signed)
NAMEDWANA, GARIN NO.:  [id number]  MEDICAL RECORD NO.:  [id number]  LOCATION:  5N06C                        FACILITY:  [facility]  PHYSICIAN:  Phylliss Bob, MD      DATE OF BIRTH:  08/12/1940  DATE OF PROCEDURE:  11/20/2014                              OPERATIVE REPORT   PREOPERATIVE DIAGNOSES: 1. Left-sided cervical radiculopathy. 2. Left-sided neural foraminal stenosis, C3-4, C4-5. 3. Status post previous C5-C7 anterior cervical discectomy and fusion.  POSTOPERATIVE DIAGNOSES: 1. Left-sided cervical radiculopathy. 2. Left-sided neural foraminal stenosis, C3-4, C4-5. 3. Status post previous C5-C7 anterior cervical discectomy and fusion.  PROCEDURE: 1. Anterior cervical decompression and fusion C3-4, C4-5. 2. Placement of anterior instrumentation, C3-C5. 3. Insertion of interbody device x2 (Titan intervertebral spacers). 4. Use of morselized allograft-DBX mix. 5. Intraoperative use of fluoroscopy. 6. Removal of anterior instrumentation, C5-C7.  (anterior plate and     screws, [employer]). 7. Exploration of spinal fusion, C5-6, C6-7.  SURGEON:  Phylliss Bob, MD.  ASSISTANTPricilla Holm, PA-C.  ANESTHESIA:  General endotracheal anesthesia.  COMPLICATIONS:  None.  DISPOSITION:  Stable.  ESTIMATED BLOOD LOSS:  Minimal.  INDICATIONS FOR SURGERY:  Briefly, Ms. Peifer is a very pleasant 74- year-old female, who did present to me with rather severe pain in her left arm.  I did review an MRI, which was notable for neural foraminal stenosis on the left side in both C3-4 and C4-5.  We did proceed with appropriate forms of conservative care, including an epidural injection and other forms of conservative care, but the patient did continue to have ongoing and severe pain limiting her activities of daily living. We therefore did discuss proceeding with the procedure reflected above.  OPERATIVE DETAILS:  On November 20, 2014, the patient was brought  to surgery and general endotracheal anesthesia was administered.  The patient was placed supine on the hospital bed.  The neck was gently extended.  The patient's arms were secured to her sides.  All bony prominences were meticulously padded.  The neck was prepped and draped in the usual sterile fashion.  After time-out procedure was performed, I did make a left-sided oblique incision anterior to the sternocleidomastoid muscle.  The platysma was incised and a Alben Deeds approach was used.  The anterior spine was noted.  I was able to readily identify the previously applied plate from K0-X3.  All 6 screws were removed as well as the anterior plate.  I then placed a Caspar pin into the C5 and C7 vertebral bodies and I performed a pushing and pulling maneuver to assess the adequacy of the previous fusion. There was a solid fusion noted, as there was no motion between the C5 and C7 vertebral bodies.  The Caspar pins were then removed and bone wax was placed in their place.  A self-retaining retractor was then placed and centered over the C4-5 intervertebral space.  Caspar pins were placed into the C4 and C5 vertebral bodies and distraction was applied. There was a prominent osteophyte noted anteriorly which was removed uneventfully.  I then performed a thorough and complete intervertebral diskectomy.  I then performed a thorough bilateral neural foraminal decompression.  The endplates were then  prepared. The appropriate size interbody spacer was then packed with DBX mix and tamped into position in the usual fashion.  I was very pleased with the press fit of the implant.  The lower Caspar pin was then removed and the bone wax was placed in its place.  I then placed a new Caspar pin into the C3 vertebral body and distraction was applied.  Once again, a thorough intervertebral diskectomy was performed, and once again, the bilateral neural foramina were evaluated and adequately  decompressed. The endplates were prepared and again, the appropriate size interbody spacer was packed with DBX mix and tamped into position.  The Caspar pins were then removed and bone wax was placed in their place.  The appropriate size anterior cervical plate was placed over the anterior spine from C3-C5.  A total of 6 vertebral body screws were then placed, 2 in each vertebral body from C3-C5.  The screws were then locked to the plate using the CAM locking mechanism.  The wound was then copiously irrigated.  I was very pleased with the final AP and lateral fluoroscopic images.  The platysma was then closed with 2-0 Vicryl, and the skin was closed with 3-0 Monocryl.  Benzoin and Steri-Strips were then applied followed by sterile dressing.  All instrument counts were correct at the termination of the procedure.  Of note, Pricilla Holm was my assistant throughout surgery, and did aid in retraction, suctioning, and closure throughout the surgery from start to finish.     Phylliss Bob, MD     MD/MEDQ  D:  11/20/2014  T:  11/21/2014  Job:  [id number]  cc:   Kathlene November, MD

## 2014-11-21 NOTE — Care Management Note (Signed)
Case Management Note  Patient Details  Name: Rebecca [name] MRN: [id number] Date of Birth: 1940/12/27  Subjective/Objective:                    Action/Plan:   Expected Discharge Date:                  Expected Discharge Plan:     In-House Referral:     Discharge planning Services     Post Acute Care Choice:    Choice offered to:     DME Arranged:    DME Agency:     HH Arranged:    [place] Agency:     Status of Service:     Medicare Important Message Given:    Date Medicare IM Given:    Medicare IM give by:    Date Additional Medicare IM Given:    Additional Medicare Important Message give by:     If discussed at [place] of Stay Meetings, dates discussed:    Additional Comments:  Ninfa Meeker, RN 11/21/2014, 12:42 PM

## 2014-11-21 NOTE — Progress Notes (Cosign Needed)
    Patient doing well, L arm pain resolved, some residual shoulder tightness, pt very comfortable and pleased with progress has eaten breakfast and been up to bathroom.   Physical Exam: Filed Vitals:   11/21/14 0701  BP: 163/66  Pulse: 70  Temp: 98.1 F (36.7 C)  Resp: 15    Dressing in place, neck soft and supple, pt appears very comfortable NVI  POD #1 s/p C3-5 ACDF with removal of prior instrumentation C5-7  - encourage ambulation - Percocet for pain, Valium for muscle spasms  -Written scripts signed and in chart  - likely d/c home today after ambulation

## 2014-11-27 NOTE — Discharge Summary (Signed)
Patient ID: Rebecca [name] MRN: [id number] DOB/AGE: 1940-06-14 74 y.o.  Admit date: 11/20/2014 Discharge date: 11/21/2014  Admission Diagnoses:  Active Problems:   Radiculopathy   Discharge Diagnoses:  Same  Past Medical History  Diagnosis Date  . Hyperlipidemia   . Osteoporosis   . Chronic neck pain   . Hypertension     No longer takes med.  Marland Kitchen Dysphagia     esophageal stretched 9-14   . Arthritis     degenerative lumbar spine   . History of blood transfusion 2009    post - lumbar fusion   . Osteoarthritis of left shoulder region     Mild, Dr. Mardelle Matte  . Complication of anesthesia     at one time my blood pressure dropped -lumbar - on bp med at that time    Surgeries: Procedure(s): ANTERIOR CERVICAL DECOMPRESSION/DISCECTOMY FUSION 2 LEVELS C3-5 on 11/20/2014   Consultants:  None  Discharged Condition: Improved  Hospital Course: Rebecca [name] is an 74 y.o. female who was admitted 11/20/2014 for operative treatment of radiculopathy. Patient has severe unremitting pain that affects sleep, daily activities, and work/hobbies. After pre-op clearance the patient was taken to the operating room on 11/20/2014 and underwent  Procedure(s): ANTERIOR CERVICAL DECOMPRESSION/DISCECTOMY FUSION 2 LEVELS C3-5.    Patient was given perioperative antibiotics:  Anti-infectives    Start     Dose/Rate Route Frequency Ordered Stop   11/21/14 0400  vancomycin (VANCOCIN) IVPB 1000 mg/200 mL premix     1,000 mg 200 mL/hr over 60 Minutes Intravenous  Once 11/20/14 1858 11/21/14 0538   11/20/14 1215  vancomycin (VANCOCIN) IVPB 1000 mg/200 mL premix     1,000 mg 200 mL/hr over 60 Minutes Intravenous On call to O.R. 11/20/14 0923 11/20/14 1350   11/20/14 0922  vancomycin (VANCOCIN) 1 GM/200ML IVPB    Comments:  Merryl Hacker   : cabinet override      11/20/14 0922 11/20/14 2129       Patient was given sequential compression devices, early ambulation to prevent DVT.  Patient  benefited maximally from hospital stay and there were no complications.    Recent vital signs: BP 147/53 mmHg  Pulse 76  Temp(Src) 98.6 F (37 C) (Oral)  Resp 18  Wt 78.926 kg (174 lb)  SpO2 97%  Discharge Medications:     Medication List    STOP taking these medications        aspirin 81 MG tablet      TAKE these medications        calcium-vitamin D 250-125 MG-UNIT per tablet  Commonly known as:  OSCAL WITH D  Take 1 tablet by mouth 2 (two) times daily.     D3-1000 PO  Take 1,000 Units by mouth daily.     fish oil-omega-3 fatty acids 1000 MG capsule  Take 1 g by mouth 3 (three) times daily.     multivitamin tablet  Take 1 tablet by mouth daily.     multivitamin-lutein Caps capsule  Take 1 capsule by mouth daily.     simvastatin 40 MG tablet  Commonly known as:  ZOCOR  Take 1 tablet (40 mg total) by mouth every evening.        Diagnostic Studies: Dg Chest 2 View  11/18/2014   CLINICAL DATA:  Preoperative evaluation for upcoming cervical surgery, hypertension  EXAM: CHEST - 2 VIEW  COMPARISON:  01/10/2012  FINDINGS: Cardiac shadow is within normal limits. The lungs are clear bilaterally.  Degenerative changes in the thoracic spine are seen. Postsurgical changes in the lumbar and cervical spine are noted.  IMPRESSION: No active disease.   Electronically Signed   By: Inez Catalina M.D.   On: 11/18/2014 13:51   Dg Cervical Spine 1 View  11/20/2014   CLINICAL DATA:  ACDF C3-C4 and C4-C5  EXAM: DG C-ARM 61-120 MIN; DG CERVICAL SPINE - 1 VIEW  COMPARISON:  MRI cervical spine 06/19/2014  FLUOROSCOPY TIME:  0 minutes 15.9 seconds new line number of images obtained: 1  FINDINGS: Bones appear demineralized.  Anterior plate and screws identified at C3-C5 post anterior fusion.  Disc prostheses present at C3-C4 and C4-C5 disc spaces.  No gross fracture or subluxation.  Anterior support tubes and surgical sponge are present.  IMPRESSION: Anterior fusion C3-C5 without acute abnormalities.    Electronically Signed   By: Lavonia Dana M.D.   On: 11/20/2014 16:47   Dg C-arm 1-60 Min  11/20/2014   CLINICAL DATA:  ACDF C3-C4 and C4-C5  EXAM: DG C-ARM 61-120 MIN; DG CERVICAL SPINE - 1 VIEW  COMPARISON:  MRI cervical spine 06/19/2014  FLUOROSCOPY TIME:  0 minutes 15.9 seconds new line number of images obtained: 1  FINDINGS: Bones appear demineralized.  Anterior plate and screws identified at C3-C5 post anterior fusion.  Disc prostheses present at C3-C4 and C4-C5 disc spaces.  No gross fracture or subluxation.  Anterior support tubes and surgical sponge are present.  IMPRESSION: Anterior fusion C3-C5 without acute abnormalities.   Electronically Signed   By: Lavonia Dana M.D.   On: 11/20/2014 16:47    Disposition: 01-Home or Self Care   POD #1 s/p C3-5 ACDF with removal of prior instrumentation C5-7  - encourage ambulation - Percocet for pain, Valium for muscle spasms -Written scripts signed and in chart  -D/C instructions sheet printed and in chart -D/C today  -F/U in office 2 weeks   Signed: Justice Britain 11/27/2014, 1:14 PM

## 2014-12-26 ENCOUNTER — Other Ambulatory Visit: Payer: Self-pay | Admitting: Internal Medicine

## 2015-01-06 ENCOUNTER — Telehealth: Payer: Self-pay | Admitting: Internal Medicine

## 2015-01-06 MED ORDER — DIAZEPAM 5 MG PO TABS: 2.5000 mg | ORAL_TABLET | Freq: Four times a day (QID) | ORAL | Status: DC | PRN

## 2015-01-06 NOTE — Telephone Encounter (Signed)
Spoke with Pt, informed her that Diazepam 5 mg that she was given by Dr. Lynann Bologna can be used for anxiety. Questioned if she felt like Diazepam helped relax her, which she stated it did, and would like to try Diazepam. Informed Pt she can take 1/2 to 1 tablet by mouth every 6 hours PRN. Pt verbalized understanding. Rx printed, awaiting MD signature.

## 2015-01-06 NOTE — Telephone Encounter (Signed)
If DIAZEPAM HELPED okay to prescribe diazepam 5 mg half or one tablet every 6 hours as needed for anxiety #30 no refills. Watch for  excessive somnolence.

## 2015-01-06 NOTE — Telephone Encounter (Signed)
Rx faxed to [facility] pharmacy.  

## 2015-01-06 NOTE — Telephone Encounter (Signed)
Relation to CB:SWHQ Call back number: [phone] Pharmacy: [facility] [address] - [place], [place] [phone] (Phone) [phone] (Fax)        Reason for call:  Patient spouse has cancer and he is also a patient of Dr. Larose Kells and patient states Dr. Larose Kells will prescribe anxiety medication due to the stress she is going thru. Patient has a schedule physical for 03/30/2015

## 2015-01-06 NOTE — Telephone Encounter (Signed)
Pt's husband is Katalena Malveaux DOB: 10/24/1938. Pt's last OV was 03/26/2014. Pt received Diazepam 5 mg(via reconcile dispense tab, see below). Please advise.   Tabby Beaston [facility]   (MR # [id number])      Medication Dispense Information    DIAZEPAM 5MG  TABLETS   Sig: TK 1 T PO Q 6 H PRF SPASMS   Dispensed: 11/21/2014 12:00 AM   Days supply: 7   Quantity: 30 each   Prescribed refills: 1   Pharmacy: [id number] - [place], [address]  [address]  [place] [address]  Phone: [phone]  Fax: [phone]   Authorizing provider: Sinclair Ship   Received from: [employer]

## 2015-02-10 ENCOUNTER — Telehealth: Payer: Self-pay | Admitting: Internal Medicine

## 2015-02-10 MED ORDER — DIAZEPAM 5 MG PO TABS: 2.5000 mg | ORAL_TABLET | Freq: Four times a day (QID) | ORAL | Status: DC | PRN

## 2015-02-10 NOTE — Telephone Encounter (Signed)
Relation to [id number] Call back [place]: [facility] [address] - [place], [place]  Reason for call:  Patient states diazepam (VALIUM) 5 MG tablet makes her go to sleep requesting a new Rx

## 2015-02-10 NOTE — Telephone Encounter (Signed)
Okay #30, no refills 

## 2015-02-10 NOTE — Telephone Encounter (Signed)
Rx printed and forwarded to Provider for signature.

## 2015-02-10 NOTE — Telephone Encounter (Signed)
Rx faxed to pharmacy  

## 2015-02-11 NOTE — Telephone Encounter (Signed)
Pt states that the diazepam makes her sleepy. She is wanting a different medication. She is requesting a call to discuss change of medication. Ph# [phone]

## 2015-02-11 NOTE — Telephone Encounter (Signed)
Schedule a office visit please

## 2015-02-11 NOTE — Telephone Encounter (Signed)
Scheduled for 02/13/15

## 2015-02-11 NOTE — Telephone Encounter (Signed)
Please see below.

## 2015-02-11 NOTE — Telephone Encounter (Signed)
Please have Pt schedule an OV to discuss per Dr. Larose Kells. Thank you.

## 2015-02-13 ENCOUNTER — Other Ambulatory Visit: Payer: Self-pay | Admitting: Internal Medicine

## 2015-02-13 ENCOUNTER — Encounter: Payer: Self-pay | Admitting: Internal Medicine

## 2015-02-13 ENCOUNTER — Ambulatory Visit (INDEPENDENT_AMBULATORY_CARE_PROVIDER_SITE_OTHER): Payer: PPO | Admitting: Internal Medicine

## 2015-02-13 VITALS — BP 122/76 | HR 73 | Temp 97.6°F | Ht 60.0 in | Wt 163.4 lb

## 2015-02-13 DIAGNOSIS — Z09 Encounter for follow-up examination after completed treatment for conditions other than malignant neoplasm: Secondary | ICD-10-CM

## 2015-02-13 DIAGNOSIS — F418 Other specified anxiety disorders: Secondary | ICD-10-CM | POA: Diagnosis not present

## 2015-02-13 DIAGNOSIS — F329 Major depressive disorder, single episode, unspecified: Secondary | ICD-10-CM

## 2015-02-13 DIAGNOSIS — F419 Anxiety disorder, unspecified: Principal | ICD-10-CM

## 2015-02-13 MED ORDER — ESCITALOPRAM OXALATE 10 MG PO TABS: 10.0000 mg | ORAL_TABLET | Freq: Every day | ORAL | Status: DC

## 2015-02-13 MED ORDER — DIAZEPAM 5 MG PO TABS: 2.5000 mg | ORAL_TABLET | Freq: Four times a day (QID) | ORAL | Status: DC | PRN

## 2015-02-13 NOTE — Progress Notes (Signed)
Subjective:    Patient ID: Rebecca [name], female    DOB: 1940/09/24, 74 y.o.   MRN: [id number]  DOS:  02/13/2015 Type of visit - description : Acute visit Interval history:  Under a lot of stress due to her husband's health, having a very hard time affording his treatment for cancer. Also her son has some issues , he is not getting appropriate help. Very anxious, depressed, crying constantly. Diazepam helps to sleep at night but make her sleepy throughout the day.  Review of Systems  Denies chest pain or difficulty breathing No nausea vomiting but feels and not in the stomach and decreased appetite. No suicidal ideas.  Past Medical History  Diagnosis Date  . Hyperlipidemia   . Osteoporosis   . Chronic neck pain   . Hypertension     No longer takes med.  Marland Kitchen Dysphagia     esophageal stretched 9-14   . Arthritis     degenerative lumbar spine   . History of blood transfusion 2009    post - lumbar fusion   . Osteoarthritis of left shoulder region     Mild, Dr. Mardelle Matte  . Complication of anesthesia     at one time my blood pressure dropped -lumbar - on bp med at that time    Past Surgical History  Procedure Laterality Date  . Cholecystectomy    . Abdominal hysterectomy    . Tonsillectomy    . Oophorectomy Bilateral   . Shoulder surgery Bilateral     x 2/Dr. Collier Salina  . Back surgery  2009    Dr Saintclair Halsted  . Neck surgery  05/2008    Dr Saintclair Halsted  . Knee surgery  9-11    right,  scope; left knee  . Arm surgery Bilateral     transposed ulner nerve 10 (dr. Amedeo Plenty)  . Carpal tunnell  2006    bilateral  . Rhinoplasty  1980  . Partial knee arthroplasty  01/17/2012    LEFT KNEE  . Partial knee arthroplasty  01/17/2012    Procedure: UNICOMPARTMENTAL KNEE;  Surgeon: Johnny Bridge, MD;  Location: [facility];  Service: Orthopedics;  Laterality: Left;  . Back surgery  01-2014    Dr Saintclair Halsted  . Anterior cervical decomp/discectomy fusion N/A 11/20/2014    Procedure: ANTERIOR CERVICAL  DECOMPRESSION/DISCECTOMY FUSION 2 LEVELS;  Surgeon: Phylliss Bob, MD;  Location: [facility];  Service: Orthopedics;  Laterality: N/A;  Anterior cervical decompression fusion, cervical 3-4, cervical 4-5 with instrumentation and allograft    Social History   Social History  . Marital Status: Married    Spouse Name: N/A  . Number of Children: 4  . Years of Education: N/A   Occupational History  . stay home     Social History Main Topics  . Smoking status: Never Smoker   . Smokeless tobacco: Never Used  . Alcohol Use: 0.0 oz/week    0 Glasses of wine per week     Comment: rare  . Drug Use: No  . Sexual Activity: Not Currently   Other Topics Concern  . Not on file   Social History Narrative   Husband has cancer    1 child lives in this area         Medication List       This list is accurate as of: 02/13/15 11:59 PM.  Always use your most recent med list.               calcium-vitamin  D 250-125 MG-UNIT tablet  Commonly known as:  OSCAL WITH D  Take 1 tablet by mouth 2 (two) times daily.     D3-1000 PO  Take 1,000 Units by mouth daily.     diazepam 5 MG tablet  Commonly known as:  VALIUM  Take 0.5-1 tablets (2.5-5 mg total) by mouth every 6 (six) hours as needed for anxiety.     escitalopram 10 MG tablet  Commonly known as:  LEXAPRO  Take 1 tablet (10 mg total) by mouth daily.     fish oil-omega-3 fatty acids 1000 MG capsule  Take 1 g by mouth 3 (three) times daily.     multivitamin tablet  Take 1 tablet by mouth daily.     multivitamin-lutein Caps capsule  Take 1 capsule by mouth daily.     simvastatin 40 MG tablet  Commonly known as:  ZOCOR  Take 1 tablet (40 mg total) by mouth every evening.           Objective:   Physical Exam BP 122/76 mmHg  Pulse 73  Temp(Src) 97.6 F (36.4 C) (Oral)  Ht 5' (1.524 m)  Wt 163 lb 6 oz (74.106 kg)  BMI 31.91 kg/m2  SpO2 97% General:   Well developed, well nourished . NAD.  HEENT:  Normocephalic . Face  symmetric, atraumatic  Skin: Not pale. Not jaundice Neurologic:  alert & oriented X3.  Speech normal, gait appropriate for age and unassisted Psych--  Cognition and judgment appear intact.  Cooperative with normal attention span and concentration.  Crying frequently during the visit, moderately anxious      Assessment> HTN  Hyperlipidemia HTN GERD Esophageal stricture stretched 2014  DJD Chronic neck pain  Osteoporosis  Plan: Anxiety, depression:  Reactional to stresses of life.  PHQ 9: 23. Patient is consulate to the best of my ability, recommend to start Lexapro, continue with diazepam as needed (Rx reprinted). Suicidality discussed. Encourage to see one of our counselor, information provided. MSK: Have a lot of problems with the left shoulder, eventually diagnosed with cervical spine problem, had surgery and feels much improved RTC 4 weeks. Today, I spent more than 25   min with the patient: >50% of the time counseling regards  Depression, anxiety, treatment options, pros/cons of meds, benefits of counseling

## 2015-02-13 NOTE — Progress Notes (Signed)
Pre visit review using our clinic review tool, if applicable. No additional management support is needed unless otherwise documented below in the visit note. 

## 2015-02-13 NOTE — Patient Instructions (Signed)
Start Lexapro today  Continue diazepam as needed  Please come back in 4 weeks.  Call if any side effects such as thoughts about suicide

## 2015-02-15 DIAGNOSIS — Z09 Encounter for follow-up examination after completed treatment for conditions other than malignant neoplasm: Secondary | ICD-10-CM | POA: Insufficient documentation

## 2015-02-15 NOTE — Assessment & Plan Note (Signed)
Anxiety, depression:  Reactional to stresses of life.  PHQ 9: 23. Patient is consulate to the best of my ability, recommend to start Lexapro, continue with diazepam as needed (Rx reprinted). Suicidality discussed. Encourage to see one of our counselor, information provided. MSK: Have a lot of problems with the left shoulder, eventually diagnosed with cervical spine problem, had surgery and feels much improved RTC 4 weeks. Today, I spent more than 25   min with the patient: >50% of the time counseling regards  Depression, anxiety, treatment options, pros/cons of meds, benefits of counseling

## 2015-02-20 ENCOUNTER — Telehealth: Payer: Self-pay | Admitting: Internal Medicine

## 2015-02-20 MED ORDER — ALPRAZOLAM 0.5 MG PO TABS: 0.5000 mg | ORAL_TABLET | Freq: Four times a day (QID) | ORAL | Status: DC | PRN

## 2015-02-20 NOTE — Telephone Encounter (Signed)
Please advise 

## 2015-02-20 NOTE — Telephone Encounter (Signed)
Spoke with Rebecca [name], informed her that Lexapro may take some time to take effect. In the meantime, d/c Diazepam and try Xanax, informed her to only take 1 tablet until she knows her effect on the medication. Informed Rebecca [name] that Xanax may cause drowsiness. Rebecca [name] verbalized understanding. Rx faxed to [facility].

## 2015-02-20 NOTE — Telephone Encounter (Signed)
Advise patient: She started Lexapro a week ago, it may take several more days to "kick in". Until that happens, we can stop diazepam and try Xanax 0.5 mg: 1 or 2 tablets every 6 hours as needed #40 no refills. Recommend to start by trying one tablet only, watch for drowsiness.

## 2015-02-20 NOTE — Telephone Encounter (Signed)
Pharmacy: [id number] - [place], [place]  Reason for call: Pt states that her anxiety is still great. She is wondering if there is something other than diazepam she can try.

## 2015-02-20 NOTE — Telephone Encounter (Signed)
Rx printed, awaiting MD signature.  

## 2015-03-04 ENCOUNTER — Telehealth: Payer: Self-pay | Admitting: Internal Medicine

## 2015-03-04 NOTE — Telephone Encounter (Signed)
Refaxed to [facility] at [phone].

## 2015-03-04 NOTE — Telephone Encounter (Signed)
Relation to [id number] Call back [place]: [facility] [address] - [place], [place] [phone] (Phone) [phone] (Fax)         Reason for call:  As per pharmacy did not receive ALPRAZolam (XANAX) 0.5 MG tablet. Patient would like office to call pharmacy directly.

## 2015-03-04 NOTE — Telephone Encounter (Signed)
Faxed to [facility] on 02/20/2015 #40 and 0RF. Received fax confirmation on 02/20/2015 at 1351. However, will refax prescription.

## 2015-03-04 NOTE — Telephone Encounter (Signed)
Received fax confirmation again on 03/04/2015 at 0837.

## 2015-03-17 ENCOUNTER — Telehealth: Payer: Self-pay

## 2015-03-17 NOTE — Telephone Encounter (Signed)
Pt is requesting refill on Alprazolam.  Last OV: 02/13/2015  Last Fill: 02/20/2015 #40 and 0 RF Pt sig: 1-2 tablets q6h PRN UDS: 03/26/2014 Low risk  Please advise.

## 2015-03-17 NOTE — Telephone Encounter (Signed)
Okay #40, refill x 1

## 2015-03-18 MED ORDER — ALPRAZOLAM 0.5 MG PO TABS: 0.5000 mg | ORAL_TABLET | Freq: Four times a day (QID) | ORAL | Status: DC | PRN

## 2015-03-18 NOTE — Telephone Encounter (Signed)
Rx faxed to [facility] pharmacy.  

## 2015-03-18 NOTE — Telephone Encounter (Signed)
Rx printed, awaiting MD signature.  

## 2015-03-30 ENCOUNTER — Encounter: Payer: Self-pay | Admitting: Internal Medicine

## 2015-03-30 ENCOUNTER — Ambulatory Visit (INDEPENDENT_AMBULATORY_CARE_PROVIDER_SITE_OTHER): Payer: PPO | Admitting: Internal Medicine

## 2015-03-30 VITALS — BP 124/78 | HR 71 | Temp 97.6°F | Ht 60.0 in | Wt 153.0 lb

## 2015-03-30 DIAGNOSIS — F329 Major depressive disorder, single episode, unspecified: Secondary | ICD-10-CM

## 2015-03-30 DIAGNOSIS — F418 Other specified anxiety disorders: Secondary | ICD-10-CM | POA: Diagnosis not present

## 2015-03-30 DIAGNOSIS — F419 Anxiety disorder, unspecified: Secondary | ICD-10-CM

## 2015-03-30 DIAGNOSIS — Z0000 Encounter for general adult medical examination without abnormal findings: Secondary | ICD-10-CM | POA: Diagnosis not present

## 2015-03-30 DIAGNOSIS — E785 Hyperlipidemia, unspecified: Secondary | ICD-10-CM

## 2015-03-30 DIAGNOSIS — R0989 Other specified symptoms and signs involving the circulatory and respiratory systems: Secondary | ICD-10-CM

## 2015-03-30 DIAGNOSIS — F32A Depression, unspecified: Secondary | ICD-10-CM

## 2015-03-30 DIAGNOSIS — R634 Abnormal weight loss: Secondary | ICD-10-CM

## 2015-03-30 LAB — LIPID PANEL
CHOLESTEROL: 133 mg/dL (ref 0–200)
HDL: 48.8 mg/dL (ref >39.00)
LDL Cholesterol: 65 mg/dL (ref 0–99)
NonHDL: 84.65
TRIGLYCERIDES: 97 mg/dL (ref 0.0–149.0)
Total CHOL/HDL Ratio: 3
VLDL: 19.4 mg/dL (ref 0.0–40.0)

## 2015-03-30 MED ORDER — ESCITALOPRAM OXALATE 10 MG PO TABS: 15.0000 mg | ORAL_TABLET | Freq: Every day | ORAL | Status: DC

## 2015-03-30 MED ORDER — CLONAZEPAM 0.5 MG PO TABS: 0.5000 mg | ORAL_TABLET | Freq: Three times a day (TID) | ORAL | Status: DC | PRN

## 2015-03-30 NOTE — Patient Instructions (Signed)
BEFORE YOU LEAVE THE OFFICE:  GO TO THE LAB  Get the blood work    GO TO THE FRONT DESK Schedule a routine office visit or check up to be done in  6 weeks  no fasting  Front desk:   30  Also schedule a visit with one of our nurses for a Medicare wellness   AFTER YOU LEAVE THE OFFICE:  Stop xanax  Try clonazepam Increase Lexapro 10 mg to 1.5 tablets daily

## 2015-03-30 NOTE — Assessment & Plan Note (Addendum)
Td around 2005 per pt, declined booster today pneumonia shot 2002, 2008; prevnar-- 2015; shingles shot-- 12-2009 Had a flu shot  had a normal  colonoscopy at [facility] 2003, Cscope 2-14 nopolyps PAPs no longer indicated, see previous entries  MMG 07-2013 neg , neg breast exam 12-2012---> discussed further screening, declined for this year +FH CAD, pt asx, on statins an ASA Palpable aorta: Check a ultrasound Lifestyle -- takes walks sporadically, not eating much , see above comments

## 2015-03-30 NOTE — Progress Notes (Signed)
Pre visit review using our clinic review tool, if applicable. No additional management support is needed unless otherwise documented below in the visit note. 

## 2015-03-30 NOTE — Progress Notes (Signed)
Subjective:    Patient ID: Rebecca [name], female    DOB: 1940/08/17, 75 y.o.   MRN: [id number]  DOS:  03/30/2015 Type of visit - description : CPX , we also discussed several other issues.  Interval history: Anxiety: On Lexapro and Xanax, improved  but still under a lot of stress due to her husband's health and financial pressure. Currently take on average 3 Xanax /24 hours., Wonders if she could take clonazepam instead (one of her friends takes it with very good results).   High cholesterol: good compliance of medication I did notice weight loss, the patient reports that that is likely from dysphagia and also anxiety/depression. Dysphagia has been stable over the last year at least   Wt Readings from Last 3 Encounters:  03/30/15 153 lb (69.4 kg)  02/13/15 163 lb 6 oz (74.106 kg)  11/20/14 174 lb (78.926 kg)     Review of Systems  Constitutional: No fever. No chills. No unexplained wt changes. No unusual sweats  HEENT: No dental problems, no ear discharge, no facial swelling, no voice changes. No eye discharge, no eye  redness , no  intolerance to light   Respiratory: No wheezing , no  difficulty breathing. No cough , no mucus production  Cardiovascular: No CP, no leg swelling , no  Palpitations  GI: no nausea, no vomiting, no diarrhea , no  abdominal pain.  No blood in the stools.     Endocrine: No polyphagia, no polyuria , no polydipsia  GU: No dysuria, gross hematuria, difficulty urinating. No urinary urgency, no frequency.  Musculoskeletal: No joint swellings or unusual aches or pains  Skin: No change in the color of the skin, palor , no  Rash  Allergic, immunologic: No environmental allergies , no  food allergies  Neurological: No dizziness no  syncope. No headaches. No diplopia, no slurred, no slurred speech, no motor deficits, no facial  Numbness  Hematological: No enlarged lymph nodes, no easy bruising , no unusual bleedings  Psychiatry: No suicidal ideas,  no hallucinations, no beavior problems, no confusion.   Past Medical History  Diagnosis Date  . Hyperlipidemia   . Osteoporosis   . Chronic neck pain   . Hypertension     No longer takes med.  Marland Kitchen Dysphagia     esophageal stretched 9-14   . Arthritis     degenerative lumbar spine   . History of blood transfusion 2009    post - lumbar fusion   . Osteoarthritis of left shoulder region     Mild, Dr. Mardelle Matte  . Complication of anesthesia     at one time my blood pressure dropped -lumbar - on bp med at that time    Past Surgical History  Procedure Laterality Date  . Cholecystectomy    . Abdominal hysterectomy    . Tonsillectomy    . Oophorectomy Bilateral   . Shoulder surgery Bilateral     x 2/Dr. Collier Salina  . Back surgery  2009    Dr Saintclair Halsted  . Neck surgery  05/2008    Dr Saintclair Halsted  . Knee surgery  9-11    right,  scope; left knee  . Arm surgery Bilateral     transposed ulner nerve 10 (dr. Amedeo Plenty)  . Carpal tunnell  2006    bilateral  . Rhinoplasty  1980  . Partial knee arthroplasty  01/17/2012    LEFT KNEE  . Partial knee arthroplasty  01/17/2012    Procedure: UNICOMPARTMENTAL KNEE;  Surgeon:  Johnny Bridge, MD;  Location: [facility];  Service: Orthopedics;  Laterality: Left;  . Back surgery  01-2014    Dr Saintclair Halsted  . Anterior cervical decomp/discectomy fusion N/A 11/20/2014    Procedure: ANTERIOR CERVICAL DECOMPRESSION/DISCECTOMY FUSION 2 LEVELS;  Surgeon: Phylliss Bob, MD;  Location: [facility];  Service: Orthopedics;  Laterality: N/A;  Anterior cervical decompression fusion, cervical 3-4, cervical 4-5 with instrumentation and allograft    Social History   Social History  . Marital Status: Married    Spouse Name: N/A  . Number of Children: 4  . Years of Education: N/A   Occupational History  . stay home     Social History Main Topics  . Smoking status: Never Smoker   . Smokeless tobacco: Never Used  . Alcohol Use: 0.0 oz/week    0 Glasses of wine per week     Comment: rare  .  Drug Use: No  . Sexual Activity: Not Currently   Other Topics Concern  . Not on file   Social History Narrative   Husband has cancer    1 child lives in this area      Family History  Problem Relation Age of Onset  . Colon cancer Neg Hx   . Prostate cancer Father   . Diabetes Sister   . Heart disease Sister     sister and brothers x 2  . Kidney disease Sister   . Kidney disease Brother   . Breast cancer Neg Hx        Medication List       This list is accurate as of: 03/30/15  5:42 PM.  Always use your most recent med list.               calcium-vitamin D 250-125 MG-UNIT tablet  Commonly known as:  OSCAL WITH D  Take 1 tablet by mouth 2 (two) times daily.     clonazePAM 0.5 MG tablet  Commonly known as:  KLONOPIN  Take 1 tablet (0.5 mg total) by mouth 3 (three) times daily as needed for anxiety.     D3-1000 PO  Take 1,000 Units by mouth daily.     escitalopram 10 MG tablet  Commonly known as:  LEXAPRO  Take 1.5 tablets (15 mg total) by mouth daily.     fish oil-omega-3 fatty acids 1000 MG capsule  Take 1 g by mouth 3 (three) times daily.     multivitamin tablet  Take 1 tablet by mouth daily.     multivitamin-lutein Caps capsule  Take 1 capsule by mouth daily.     simvastatin 40 MG tablet  Commonly known as:  ZOCOR  Take 1 tablet (40 mg total) by mouth every evening.           Objective:   Physical Exam BP 124/78 mmHg  Pulse 71  Temp(Src) 97.6 F (36.4 C) (Oral)  Ht 5' (1.524 m)  Wt 153 lb (69.4 kg)  BMI 29.88 kg/m2  SpO2 97% General:   Well developed, well nourished . NAD.  HEENT:  Normocephalic . Face symmetric, atraumatic Lungs:  CTA B Normal respiratory effort, no intercostal retractions, no accessory muscle use. Heart: RRR,  no murmur.  no pretibial edema bilaterally  Abdomen:  Not distended, soft, non-tender. No rebound or rigidity. Palpable,nontender aorta at upper abd, no bruit  Skin: Not pale. Not jaundice Neurologic:    alert & oriented X3.  Speech normal, gait appropriate for age and unassisted Psych--  Cognition and  judgment appear intact.  Cooperative with normal attention span and concentration.  Behavior appropriate.Tearful when we talk about her husband [employer], slightly depressed.    Assessment & Plan:   Assessment> HTN  Hyperlipidemia GERD Esophageal stricture stretched 2014  MSK: --DJD --Chronic neck pain  --spinal stenosis --multiple surgeries Osteoporosis DEXA osteoprosis 2006 (per pt) DEXA 11-08 normal  DEXA 9-12 mild osteopenia, rec exercise, ca and vit D  Plan: Anxiety depression: Improving but not completely well. PHQ -9 was 29, today 4 (accurate?) Pt request a change from Xanax to clonazepam, will try and see how that works. Increase Lexapro from 10 mg to 15 mg HTN: on no meds, BP normal Hyperlipidemia: Check labs Weight loss: I'm concern about her weight, is likely d/t  a combination of depression, ongoing dysphagia. Dysphagia is not worse over the last year, she is very hesitant to see GI. Depression has improved a little. Reassess in 6 weeks. Osteoporosis: Will rx a dexa on RTC  RTC 6 weeks  Today, in addition to her physical exam, I spent more than 30 min with the patient,  >50% of the time counseling regards anxiety, depression, listening to her concerns.

## 2015-03-31 ENCOUNTER — Ambulatory Visit (HOSPITAL_BASED_OUTPATIENT_CLINIC_OR_DEPARTMENT_OTHER)
Admission: RE | Admit: 2015-03-31 | Discharge: 2015-03-31 | Disposition: A | Discharge status: Home / Self Care | Payer: PPO | Source: Ambulatory Visit | Attending: Internal Medicine | Admitting: Internal Medicine

## 2015-03-31 DIAGNOSIS — Z136 Encounter for screening for cardiovascular disorders: Secondary | ICD-10-CM | POA: Diagnosis not present

## 2015-03-31 DIAGNOSIS — E785 Hyperlipidemia, unspecified: Secondary | ICD-10-CM | POA: Diagnosis not present

## 2015-03-31 DIAGNOSIS — R0989 Other specified symptoms and signs involving the circulatory and respiratory systems: Secondary | ICD-10-CM | POA: Diagnosis not present

## 2015-04-02 ENCOUNTER — Telehealth: Payer: Self-pay | Admitting: Internal Medicine

## 2015-04-02 MED ORDER — SIMVASTATIN 40 MG PO TABS: 40.0000 mg | ORAL_TABLET | Freq: Every evening | ORAL | Status: DC

## 2015-04-02 NOTE — Telephone Encounter (Signed)
Rx sent 

## 2015-04-02 NOTE — Telephone Encounter (Signed)
Caller name: Self  Can be reached: [phone]  Pharmacy:  [id number] - [place], [place] [phone] (Phone) [phone] (Fax)         Reason for call: request refill on simvastatin (ZOCOR) 40 MG tablet [id number]

## 2015-04-20 ENCOUNTER — Other Ambulatory Visit: Payer: Self-pay | Admitting: Internal Medicine

## 2015-04-20 NOTE — Telephone Encounter (Signed)
Pt is requesting refill on Clonazepam.  Last OV: 03/30/2015 Last Fill: 03/30/2015 #90 and 0RF Pt sig: 1 tablet tid PRN (Request is 10 days early) UDS: None  Please advise.

## 2015-04-21 NOTE — Telephone Encounter (Signed)
Ok 90, no RF 

## 2015-04-21 NOTE — Telephone Encounter (Signed)
Rx faxed to [facility] pharmacy.  

## 2015-04-21 NOTE — Telephone Encounter (Signed)
Rx printed, awaiting MD signature.  

## 2015-05-11 ENCOUNTER — Ambulatory Visit: Payer: PPO

## 2015-05-11 ENCOUNTER — Encounter: Payer: Self-pay | Admitting: Internal Medicine

## 2015-05-11 ENCOUNTER — Ambulatory Visit (INDEPENDENT_AMBULATORY_CARE_PROVIDER_SITE_OTHER): Payer: PPO | Admitting: Internal Medicine

## 2015-05-11 VITALS — BP 124/74 | HR 82 | Temp 97.5°F | Ht 60.0 in | Wt 146.1 lb

## 2015-05-11 DIAGNOSIS — Z780 Asymptomatic menopausal state: Secondary | ICD-10-CM

## 2015-05-11 DIAGNOSIS — F418 Other specified anxiety disorders: Secondary | ICD-10-CM

## 2015-05-11 DIAGNOSIS — M8580 Other specified disorders of bone density and structure, unspecified site: Secondary | ICD-10-CM

## 2015-05-11 DIAGNOSIS — R634 Abnormal weight loss: Secondary | ICD-10-CM | POA: Diagnosis not present

## 2015-05-11 DIAGNOSIS — Z0000 Encounter for general adult medical examination without abnormal findings: Secondary | ICD-10-CM

## 2015-05-11 DIAGNOSIS — Z09 Encounter for follow-up examination after completed treatment for conditions other than malignant neoplasm: Secondary | ICD-10-CM

## 2015-05-11 DIAGNOSIS — F419 Anxiety disorder, unspecified: Principal | ICD-10-CM

## 2015-05-11 DIAGNOSIS — F329 Major depressive disorder, single episode, unspecified: Secondary | ICD-10-CM

## 2015-05-11 MED ORDER — ESCITALOPRAM OXALATE 20 MG PO TABS: 20.0000 mg | ORAL_TABLET | Freq: Every day | ORAL | Status: DC

## 2015-05-11 NOTE — Patient Instructions (Addendum)
GO TO THE LAB : Get the blood work    GO TO THE FRONT DESK Schedule a routine office visit or check up to be done in  4 weeks No  fasting    Bring a copy of your advanced directive/living will to next visit.    Continue doing brain stimulating activities (puzzles, reading, adult coloring books, staying active) to keep memory sharp.   Try journaling for emotional health.    Continue to eat heart healthy diet (full of fruits, vegetables, whole grains, lean protein, water--limit salt, fat, and sugar intake) and increase physical activity as tolerated.  Try walking and light resistance training to increase physical activity.    Health Maintenance, Female Adopting a healthy lifestyle and getting preventive care can go a long way to promote health and wellness. Talk with your health care provider about what schedule of regular examinations is right for you. This is a good chance for you to check in with your provider about disease prevention and staying healthy. In between checkups, there are plenty of things you can do on your own. Experts have done a lot of research about which lifestyle changes and preventive measures are most likely to keep you healthy. Ask your health care provider for more information. WEIGHT AND DIET  Eat a healthy diet  Be sure to include plenty of vegetables, fruits, low-fat dairy products, and lean protein.  Do not eat a lot of foods high in solid fats, added sugars, or salt.  Get regular exercise. This is one of the most important things you can do for your health.  Most adults should exercise for at least 150 minutes each week. The exercise should increase your heart rate and make you sweat (moderate-intensity exercise).  Most adults should also do strengthening exercises at least twice a week. This is in addition to the moderate-intensity exercise.  Maintain a healthy weight  Body mass index (BMI) is a measurement that can be used to identify possible weight  problems. It estimates body fat based on height and weight. Your health care provider can help determine your BMI and help you achieve or maintain a healthy weight.  For females 11 years of age and older:   A BMI below 18.5 is considered underweight.  A BMI of 18.5 to 24.9 is normal.  A BMI of 25 to 29.9 is considered overweight.  A BMI of 30 and above is considered obese.  Watch levels of cholesterol and blood lipids  You should start having your blood tested for lipids and cholesterol at 75 years of age, then have this test every 5 years.  You may need to have your cholesterol levels checked more often if:  Your lipid or cholesterol levels are high.  You are older than 75 years of age.  You are at high risk for heart disease.  CANCER SCREENING   Lung Cancer  Lung cancer screening is recommended for adults 36-75 years old who are at high risk for lung cancer because of a history of smoking.  A yearly low-dose CT scan of the lungs is recommended for people who:  Currently smoke.  Have quit within the past 15 years.  Have at least a 30-pack-year history of smoking. A pack year is smoking an average of one pack of cigarettes a day for 1 year.  Yearly screening should continue until it has been 15 years since you quit.  Yearly screening should stop if you develop a health problem that would prevent you  from having lung cancer treatment.  Breast Cancer  Practice breast self-awareness. This means understanding how your breasts normally appear and feel.  It also means doing regular breast self-exams. Let your health care provider know about any changes, no matter how small.  If you are in your 20s or 30s, you should have a clinical breast exam (CBE) by a health care provider every 1-3 years as part of a regular health exam.  If you are 33 or older, have a CBE every year. Also consider having a breast X-ray (mammogram) every year.  If you have a family history of breast  cancer, talk to your health care provider about genetic screening.  If you are at high risk for breast cancer, talk to your health care provider about having an MRI and a mammogram every year.  Breast cancer gene (BRCA) assessment is recommended for women who have family members with BRCA-related cancers. BRCA-related cancers include:  Breast.  Ovarian.  Tubal.  Peritoneal cancers.  Results of the assessment will determine the need for genetic counseling and BRCA1 and BRCA2 testing. Cervical Cancer Your health care provider may recommend that you be screened regularly for cancer of the pelvic organs (ovaries, uterus, and vagina). This screening involves a pelvic examination, including checking for microscopic changes to the surface of your cervix (Pap test). You may be encouraged to have this screening done every 3 years, beginning at age 98.  For women ages 41-65, health care providers may recommend pelvic exams and Pap testing every 3 years, or they may recommend the Pap and pelvic exam, combined with testing for human papilloma virus (HPV), every 5 years. Some types of HPV increase your risk of cervical cancer. Testing for HPV may also be done on women of any age with unclear Pap test results.  Other health care providers may not recommend any screening for nonpregnant women who are considered low risk for pelvic cancer and who do not have symptoms. Ask your health care provider if a screening pelvic exam is right for you.  If you have had past treatment for cervical cancer or a condition that could lead to cancer, you need Pap tests and screening for cancer for at least 20 years after your treatment. If Pap tests have been discontinued, your risk factors (such as having a new sexual partner) need to be reassessed to determine if screening should resume. Some women have medical problems that increase the chance of getting cervical cancer. In these cases, your health care provider may  recommend more frequent screening and Pap tests. Colorectal Cancer  This type of cancer can be detected and often prevented.  Routine colorectal cancer screening usually begins at 75 years of age and continues through 75 years of age.  Your health care provider may recommend screening at an earlier age if you have risk factors for colon cancer.  Your health care provider may also recommend using home test kits to check for hidden blood in the stool.  A small camera at the end of a tube can be used to examine your colon directly (sigmoidoscopy or colonoscopy). This is done to check for the earliest forms of colorectal cancer.  Routine screening usually begins at age 62.  Direct examination of the colon should be repeated every 5-10 years through 75 years of age. However, you may need to be screened more often if early forms of precancerous polyps or small growths are found. Skin Cancer  Check your skin from head to toe  regularly.  Tell your health care provider about any new moles or changes in moles, especially if there is a change in a mole's shape or color.  Also tell your health care provider if you have a mole that is larger than the size of a pencil eraser.  Always use sunscreen. Apply sunscreen liberally and repeatedly throughout the day.  Protect yourself by wearing long sleeves, pants, a wide-brimmed hat, and sunglasses whenever you are outside. HEART DISEASE, DIABETES, AND HIGH BLOOD PRESSURE   High blood pressure causes heart disease and increases the risk of stroke. High blood pressure is more likely to develop in:  People who have blood pressure in the high end of the normal range (130-139/85-89 mm Hg).  People who are overweight or obese.  People who are African American.  If you are 94-18 years of age, have your blood pressure checked every 3-5 years. If you are 73 years of age or older, have your blood pressure checked every year. You should have your blood  pressure measured twice--once when you are at a hospital or clinic, and once when you are not at a hospital or clinic. Record the average of the two measurements. To check your blood pressure when you are not at a hospital or clinic, you can use:  An automated blood pressure machine at a pharmacy.  A home blood pressure monitor.  If you are between 46 years and 5 years old, ask your health care provider if you should take aspirin to prevent strokes.  Have regular diabetes screenings. This involves taking a blood sample to check your fasting blood sugar level.  If you are at a normal weight and have a low risk for diabetes, have this test once every three years after 75 years of age.  If you are overweight and have a high risk for diabetes, consider being tested at a younger age or more often. PREVENTING INFECTION  Hepatitis B  If you have a higher risk for hepatitis B, you should be screened for this virus. You are considered at high risk for hepatitis B if:  You were born in a country where hepatitis B is common. Ask your health care provider which countries are considered high risk.  Your parents were born in a high-risk country, and you have not been immunized against hepatitis B (hepatitis B vaccine).  You have HIV or AIDS.  You use needles to inject street drugs.  You live with someone who has hepatitis B.  You have had sex with someone who has hepatitis B.  You get hemodialysis treatment.  You take certain medicines for conditions, including cancer, organ transplantation, and autoimmune conditions. Hepatitis C  Blood testing is recommended for:  Everyone born from [birth year] through 1965.  Anyone with known risk factors for hepatitis C. Sexually transmitted infections (STIs)  You should be screened for sexually transmitted infections (STIs) including gonorrhea and chlamydia if:  You are sexually active and are younger than 75 years of age.  You are older than 75 years  of age and your health care provider tells you that you are at risk for this type of infection.  Your sexual activity has changed since you were last screened and you are at an increased risk for chlamydia or gonorrhea. Ask your health care provider if you are at risk.  If you do not have HIV, but are at risk, it may be recommended that you take a prescription medicine daily to prevent HIV infection. This is  called pre-exposure prophylaxis (PrEP). You are considered at risk if:  You are sexually active and do not regularly use condoms or know the HIV status of your partner(s).  You take drugs by injection.  You are sexually active with a partner who has HIV. Talk with your health care provider about whether you are at high risk of being infected with HIV. If you choose to begin PrEP, you should first be tested for HIV. You should then be tested every 3 months for as long as you are taking PrEP.  PREGNANCY   If you are premenopausal and you may become pregnant, ask your health care provider about preconception counseling.  If you may become pregnant, take 400 to 800 micrograms (mcg) of folic acid every day.  If you want to prevent pregnancy, talk to your health care provider about birth control (contraception). OSTEOPOROSIS AND MENOPAUSE   Osteoporosis is a disease in which the bones lose minerals and strength with aging. This can result in serious bone fractures. Your risk for osteoporosis can be identified using a bone density scan.  If you are 58 years of age or older, or if you are at risk for osteoporosis and fractures, ask your health care provider if you should be screened.  Ask your health care provider whether you should take a calcium or vitamin D supplement to lower your risk for osteoporosis.  Menopause may have certain physical symptoms and risks.  Hormone replacement therapy may reduce some of these symptoms and risks. Talk to your health care provider about whether hormone  replacement therapy is right for you.  HOME CARE INSTRUCTIONS   Schedule regular health, dental, and eye exams.  Stay current with your immunizations.   Do not use any tobacco products including cigarettes, chewing tobacco, or electronic cigarettes.  If you are pregnant, do not drink alcohol.  If you are breastfeeding, limit how much and how often you drink alcohol.  Limit alcohol intake to no more than 1 drink per day for nonpregnant women. One drink equals 12 ounces of beer, 5 ounces of wine, or 1 ounces of hard liquor.  Do not use street drugs.  Do not share needles.  Ask your health care provider for help if you need support or information about quitting drugs.  Tell your health care provider if you often feel depressed.  Tell your health care provider if you have ever been abused or do not feel safe at home.   This information is not intended to replace advice given to you by your health care provider. Make sure you discuss any questions you have with your health care provider.   Document Released: 09/13/2010 Document Revised: 03/21/2014 Document Reviewed: 01/30/2013 Elsevier Interactive Patient Education [employer].

## 2015-05-11 NOTE — Progress Notes (Addendum)
Subjective:   Rebecca [name] is a 75 y.o. female who presents for Medicare Annual (Subsequent) preventive examination.  Review of Systems:  No ROS Cardiac Risk Factors include: advanced age (>43men, >50 women);hypertension Sleep patterns:  Sleeps at least 8 hours/Takes sleeping aid/  Wakes occasionally during the night to void.  Home Safety/Smoke Alarms:  Feels safe at home.  Lives with husband and son in 2 story home.  Smoke alarms present.   Firearm Safety: Keeps them in safe place.   Seat Belt Safety/Bike Helmet:  Always wears seat belt.    Counseling:   Eye Exam- Plans to schedule an appt.   Dental-  Plans to schedule an appt.   Female:  Pap- Hysterectomy     Mammo-Pt declined.  Dexa scan- Ordered and scheduled      CCS- 05/08/12-mild diverticulosis.       Objective:     Vitals: BP 124/74 mmHg  Pulse 82  Temp(Src) 97.5 F (36.4 C) (Oral)  Ht 5' (1.524 m)  Wt 146 lb 2 oz (66.282 kg)  BMI 28.54 kg/m2  SpO2 97%  Tobacco History  Smoking status  . Never Smoker   Smokeless tobacco  . Never Used     Counseling given: No   Past Medical History  Diagnosis Date  . Hyperlipidemia   . Osteoporosis   . Chronic neck pain   . Hypertension     No longer takes med.  Marland Kitchen Dysphagia     esophageal stretched 9-14   . Arthritis     degenerative lumbar spine   . History of blood transfusion 2009    post - lumbar fusion   . Osteoarthritis of left shoulder region     Mild, Dr. Mardelle Matte  . Complication of anesthesia     at one time my blood pressure dropped -lumbar - on bp med at that time   Past Surgical History  Procedure Laterality Date  . Cholecystectomy    . Abdominal hysterectomy    . Tonsillectomy    . Oophorectomy Bilateral   . Shoulder surgery Bilateral     x 2/Dr. Collier Salina  . Back surgery  2009    Dr Saintclair Halsted  . Neck surgery  05/2008    Dr Saintclair Halsted  . Knee surgery  9-11    right,  scope; left knee  . Arm surgery Bilateral     transposed ulner nerve 10 (dr.  Amedeo Plenty)  . Carpal tunnell  2006    bilateral  . Rhinoplasty  1980  . Partial knee arthroplasty  01/17/2012    LEFT KNEE  . Partial knee arthroplasty  01/17/2012    Procedure: UNICOMPARTMENTAL KNEE;  Surgeon: Johnny Bridge, MD;  Location: [facility];  Service: Orthopedics;  Laterality: Left;  . Back surgery  01-2014    Dr Saintclair Halsted  . Anterior cervical decomp/discectomy fusion N/A 11/20/2014    Procedure: ANTERIOR CERVICAL DECOMPRESSION/DISCECTOMY FUSION 2 LEVELS;  Surgeon: Phylliss Bob, MD;  Location: [facility];  Service: Orthopedics;  Laterality: N/A;  Anterior cervical decompression fusion, cervical 3-4, cervical 4-5 with instrumentation and allograft   Family History  Problem Relation Age of Onset  . Colon cancer Neg Hx   . Prostate cancer Father   . Diabetes Sister   . Heart disease Sister     sister and brothers x 2  . Kidney disease Sister   . Kidney disease Brother   . Breast cancer Neg Hx    History  Sexual Activity  . Sexual  Activity: Not Currently    Outpatient Encounter Prescriptions as of 05/11/2015  Medication Sig  . calcium-vitamin D (OSCAL WITH D) 250-125 MG-UNIT per tablet Take 1 tablet by mouth 2 (two) times daily.  . Cholecalciferol (D3-1000 PO) Take 1,000 Units by mouth daily.    . clonazePAM (KLONOPIN) 0.5 MG tablet Take 1 tablet (0.5 mg total) by mouth 3 (three) times daily as needed for anxiety.  . fish oil-omega-3 fatty acids 1000 MG capsule Take 1 g by mouth 3 (three) times daily.   . Multiple Vitamin (MULTIVITAMIN) tablet Take 1 tablet by mouth daily.  . multivitamin-lutein (OCUVITE-LUTEIN) CAPS capsule Take 1 capsule by mouth daily.  . simvastatin (ZOCOR) 40 MG tablet Take 1 tablet (40 mg total) by mouth every evening.  . [DISCONTINUED] escitalopram (LEXAPRO) 10 MG tablet Take 1.5 tablets (15 mg total) by mouth daily.  Marland Kitchen escitalopram (LEXAPRO) 20 MG tablet Take 1 tablet (20 mg total) by mouth daily.   No facility-administered encounter medications on file as of  05/11/2015.    Activities of Daily Living In your present state of health, do you have any difficulty performing the following activities: 05/11/2015 03/30/2015  Hearing? N N  Vision? N N  Difficulty concentrating or making decisions? N N  Walking or climbing stairs? N N  Dressing or bathing? N N  Doing errands, shopping? N N  Preparing Food and eating ? N -  Using the Toilet? N -  In the past six months, have you accidently leaked urine? N -  Do you have problems with loss of bowel control? N -  Managing your Medications? N -  Managing your Finances? N -  Housekeeping or managing your Housekeeping? N -    Patient Care Team: Colon Branch, MD as PCP - General Kary Kos, MD as Consulting Physician (Neurosurgery) Marchia Bond, MD as Consulting Physician (Orthopedic Surgery) Phylliss Bob, MD as Consulting Physician (Orthopedic Surgery)    Assessment:  Deferred to Dr. Larose Kells.    Exercise Activities and Dietary recommendations Current Exercise Habits:: The patient does not participate in regular exercise at present (Stays active cleaning the house and taking care husband.  )   Diet:  Regular diet.  Mostly cooks.  Eats 2 meals per day.  Drinks one protein drink as a meal each day.   Goals    . Increase physical activity     Wants to start back walking and tone her body.        Fall Risk Fall Risk  05/11/2015 03/30/2015 02/13/2015 03/26/2014 12/19/2012  Falls in the past year? No No No No No   Depression Screen PHQ 2/9 Scores 05/11/2015 03/30/2015 02/13/2015 03/26/2014  PHQ - 2 Score 1 1 6  0  PHQ- 9 Score - 4 23 -     Cognitive Testing MMSE - Mini Mental State Exam 05/11/2015  Orientation to time 5  Orientation to Place 5  Registration 3  Attention/ Calculation 5  Recall 3  Language- name 2 objects 2  Language- repeat 1  Language- follow 3 step command 3  Language- read & follow direction 1  Write a sentence 1  Copy design 1  Total score 30    Immunization History    Administered Date(s) Administered  . Influenza Split 11/25/2011, 11/21/2013  . Influenza Whole 01/16/2007, 02/24/2009, 12/14/2009  . Influenza, High Dose Seasonal PF 12/19/2012  . Influenza-Unspecified 12/17/2014  . Pneumococcal Conjugate-13 03/26/2014  . Pneumococcal Polysaccharide-23 01/16/2007  . Zoster 12/29/2009   Screening Tests  Health Maintenance  Topic Date Due  . TETANUS/TDAP  06/08/1959  . DEXA SCAN  12/09/2012  . MAMMOGRAM  08/08/2014  . INFLUENZA VACCINE  10/13/2015  . COLONOSCOPY  05/08/2022  . ZOSTAVAX  Completed  . PNA vac Low Risk Adult  Completed      Plan:  Bring a copy of your advanced directive/living will to next visit.    Continue doing brain stimulating activities (puzzles, reading, adult coloring books, staying active) to keep memory sharp.   Try journaling for emotional health.    Eat heart healthy diet (full of fruits, vegetables, whole grains, lean protein, water--limit salt, fat, and sugar intake) and increase physical activity as tolerated.  Try walking and light resistance training to increase physical activity.     During the course of the visit the patient was educated and counseled about the following appropriate screening and preventive services:   Vaccines to include Pneumoccal, Influenza, Hepatitis B, Td, Zostavax, HCV  Electrocardiogram  Cardiovascular Disease  Colorectal cancer screening  Bone density screening  Diabetes screening  Glaucoma screening  Mammography/PAP  Nutrition counseling   Patient Instructions (the written plan) was given to the patient.   Rudene Anda, RN  05/11/2015   Agree,  French Ana MD

## 2015-05-11 NOTE — Progress Notes (Signed)
Subjective:    Patient ID: Rebecca [name], female    DOB: 1941-01-19, 75 y.o.   MRN: [id number]  DOS:  05/11/2015 Type of visit - description : Follow-up from previous visit Interval history: Anxiety depression: Good med  compliance  , Lexapro dose was increased, overall feels better, good days and bad days. Her husband is here, he confirms that in his mind she has improved. Weight loss:-Reports lack of appetite. Has noted  her cravings for chocolate and regular sodas have markedly decreased.  Wt Readings from Last 3 Encounters:  05/11/15 146 lb 2 oz (66.282 kg)  03/30/15 153 lb (69.4 kg)  02/13/15 163 lb 6 oz (74.106 kg)     Review of Systems No suicidal ideas no fever or chills No nausea, vomiting, diarrhea. No recent dysphagia. No cough No headaches, unusual aches or pains. Overall feels more active and motivated.  Past Medical History  Diagnosis Date  . Hyperlipidemia   . Osteoporosis   . Chronic neck pain   . Hypertension     No longer takes med.  Marland Kitchen Dysphagia     esophageal stretched 9-14   . Arthritis     degenerative lumbar spine   . History of blood transfusion 2009    post - lumbar fusion   . Osteoarthritis of left shoulder region     Mild, Dr. Mardelle Matte  . Complication of anesthesia     at one time my blood pressure dropped -lumbar - on bp med at that time    Past Surgical History  Procedure Laterality Date  . Cholecystectomy    . Abdominal hysterectomy    . Tonsillectomy    . Oophorectomy Bilateral   . Shoulder surgery Bilateral     x 2/Dr. Collier Salina  . Back surgery  2009    Dr Saintclair Halsted  . Neck surgery  05/2008    Dr Saintclair Halsted  . Knee surgery  9-11    right,  scope; left knee  . Arm surgery Bilateral     transposed ulner nerve 10 (dr. Amedeo Plenty)  . Carpal tunnell  2006    bilateral  . Rhinoplasty  1980  . Partial knee arthroplasty  01/17/2012    LEFT KNEE  . Partial knee arthroplasty  01/17/2012    Procedure: UNICOMPARTMENTAL KNEE;  Surgeon: Johnny Bridge, MD;  Location: [facility];  Service: Orthopedics;  Laterality: Left;  . Back surgery  01-2014    Dr Saintclair Halsted  . Anterior cervical decomp/discectomy fusion N/A 11/20/2014    Procedure: ANTERIOR CERVICAL DECOMPRESSION/DISCECTOMY FUSION 2 LEVELS;  Surgeon: Phylliss Bob, MD;  Location: [facility];  Service: Orthopedics;  Laterality: N/A;  Anterior cervical decompression fusion, cervical 3-4, cervical 4-5 with instrumentation and allograft    Social History   Social History  . Marital Status: Married    Spouse Name: N/A  . Number of Children: 4  . Years of Education: N/A   Occupational History  . stay home     Social History Main Topics  . Smoking status: Never Smoker   . Smokeless tobacco: Never Used  . Alcohol Use: 0.0 oz/week    0 Glasses of wine per week     Comment: rare  . Drug Use: No  . Sexual Activity: Not Currently   Other Topics Concern  . Not on file   Social History Narrative   Husband has cancer    1 child lives in this area         Medication List  This list is accurate as of: 05/11/15 11:59 PM.  Always use your most recent med list.               calcium-vitamin D 250-125 MG-UNIT tablet  Commonly known as:  OSCAL WITH D  Take 1 tablet by mouth 2 (two) times daily.     clonazePAM 0.5 MG tablet  Commonly known as:  KLONOPIN  Take 1 tablet (0.5 mg total) by mouth 3 (three) times daily as needed for anxiety.     D3-1000 PO  Take 1,000 Units by mouth daily.     escitalopram 20 MG tablet  Commonly known as:  LEXAPRO  Take 1 tablet (20 mg total) by mouth daily.     fish oil-omega-3 fatty acids 1000 MG capsule  Take 1 g by mouth 3 (three) times daily.     multivitamin tablet  Take 1 tablet by mouth daily.     multivitamin-lutein Caps capsule  Take 1 capsule by mouth daily.     simvastatin 40 MG tablet  Commonly known as:  ZOCOR  Take 1 tablet (40 mg total) by mouth every evening.           Objective:   Physical Exam BP 124/74 mmHg   Pulse 82  Temp(Src) 97.5 F (36.4 C) (Oral)  Ht 5' (1.524 m)  Wt 146 lb 2 oz (66.282 kg)  BMI 28.54 kg/m2  SpO2 97% General:   Well developed, well nourished . NAD.  HEENT:  Normocephalic . Face symmetric, atraumatic Skin: Not pale. Not jaundice Neurologic:  alert & oriented X3.  Speech normal, gait appropriate for age and unassisted Psych--  Cognition and judgment appear intact.  Cooperative with normal attention span and concentration.  Behavior appropriate. Looks about the same, tearful during times of the visit.     Assessment & Plan:   Assessment> HTN  Hyperlipidemia Anxiety depression: onset Fall 2016 d/t husband health, lost sister 03-2015. rx lexapro 02-2015 GERD Esophageal stricture stretched 2014  MSK: --DJD --Chronic neck pain  --spinal stenosis --multiple surgeries Osteoporosis DEXA osteoprosis 2006 (per pt) DEXA 11-08 normal  DEXA 9-12 mild osteopenia, rec exercise, ca and vit D  Plan: Anxiety depression: both the patient and her husband reports she is improving, she is still tearful during portions of the visit. She is counseled, will increase Lexapro from 15 to 20 mg daily and reassess in 4 weeks. Continue clonazepam as needed, if not better when she comes back consider adding Wellbutrin. Encouraged counseling, information provided. Weight loss: Reports decrease of appetite and loss of craving for chocolate /sodas.  ROS (-) except for  depression. Will check a CBC, A1c, TSH. Reassess on RTC Ordering a bone density test today RTC 4 weeks

## 2015-05-11 NOTE — Progress Notes (Signed)
Pre visit review using our clinic review tool, if applicable. No additional management support is needed unless otherwise documented below in the visit note. 

## 2015-05-12 LAB — CBC WITH DIFFERENTIAL/PLATELET
BASOS ABS: 0 10*3/uL (ref 0.0–0.1)
Basophils Relative: 0.3 % (ref 0.0–3.0)
EOS ABS: 0.1 10*3/uL (ref 0.0–0.7)
Eosinophils Relative: 1.9 % (ref 0.0–5.0)
HEMATOCRIT: 38.1 % (ref 36.0–46.0)
Hemoglobin: 13 g/dL (ref 12.0–15.0)
LYMPHS PCT: 25 % (ref 12.0–46.0)
Lymphs Abs: 1.2 10*3/uL (ref 0.7–4.0)
MCHC: 34.2 g/dL (ref 30.0–36.0)
MCV: 92.3 fl (ref 78.0–100.0)
MONOS PCT: 3.2 % (ref 3.0–12.0)
Monocytes Absolute: 0.2 10*3/uL (ref 0.1–1.0)
NEUTROS ABS: 3.4 10*3/uL (ref 1.4–7.7)
Neutrophils Relative %: 69.6 % (ref 43.0–77.0)
PLATELETS: 144 10*3/uL — AB (ref 150.0–400.0)
RBC: 4.12 Mil/uL (ref 3.87–5.11)
RDW: 14.3 % (ref 11.5–15.5)
WBC: 4.9 10*3/uL (ref 4.0–10.5)

## 2015-05-12 LAB — TSH: TSH: 1.32 u[IU]/mL (ref 0.35–4.50)

## 2015-05-12 LAB — HEMOGLOBIN A1C: Hgb A1c MFr Bld: 5.2 % (ref 4.6–6.5)

## 2015-05-12 NOTE — Assessment & Plan Note (Signed)
Anxiety depression: both the patient and her husband reports she is improving, she is still tearful during portions of the visit. She is counseled, will increase Lexapro from 15 to 20 mg daily and reassess in 4 weeks. Continue clonazepam as needed, if not better when she comes back consider adding Wellbutrin. Encouraged counseling, information provided. Weight loss: Reports decrease of appetite and loss of craving for chocolate /sodas.  ROS (-) except for  depression. Will check a CBC, A1c, TSH. Reassess on RTC Ordering a bone density test today RTC 4 weeks

## 2015-05-26 ENCOUNTER — Ambulatory Visit (INDEPENDENT_AMBULATORY_CARE_PROVIDER_SITE_OTHER)
Admission: RE | Admit: 2015-05-26 | Discharge: 2015-05-26 | Disposition: A | Discharge status: Home / Self Care | Payer: PPO | Source: Ambulatory Visit | Attending: Internal Medicine | Admitting: Internal Medicine

## 2015-05-26 DIAGNOSIS — Z780 Asymptomatic menopausal state: Secondary | ICD-10-CM | POA: Diagnosis not present

## 2015-05-26 DIAGNOSIS — M8580 Other specified disorders of bone density and structure, unspecified site: Secondary | ICD-10-CM | POA: Diagnosis not present

## 2015-06-08 ENCOUNTER — Encounter: Payer: Self-pay | Admitting: Internal Medicine

## 2015-06-08 ENCOUNTER — Ambulatory Visit (HOSPITAL_BASED_OUTPATIENT_CLINIC_OR_DEPARTMENT_OTHER)
Admission: RE | Admit: 2015-06-08 | Discharge: 2015-06-08 | Disposition: A | Discharge status: Home / Self Care | Payer: PPO | Source: Ambulatory Visit | Attending: Internal Medicine | Admitting: Internal Medicine

## 2015-06-08 ENCOUNTER — Ambulatory Visit (INDEPENDENT_AMBULATORY_CARE_PROVIDER_SITE_OTHER): Payer: PPO | Admitting: Internal Medicine

## 2015-06-08 VITALS — BP 122/78 | HR 71 | Temp 97.8°F | Ht 60.0 in | Wt 141.2 lb

## 2015-06-08 DIAGNOSIS — R634 Abnormal weight loss: Secondary | ICD-10-CM

## 2015-06-08 DIAGNOSIS — R1013 Epigastric pain: Secondary | ICD-10-CM

## 2015-06-08 DIAGNOSIS — F329 Major depressive disorder, single episode, unspecified: Secondary | ICD-10-CM

## 2015-06-08 DIAGNOSIS — Z79899 Other long term (current) drug therapy: Secondary | ICD-10-CM | POA: Diagnosis not present

## 2015-06-08 DIAGNOSIS — F418 Other specified anxiety disorders: Secondary | ICD-10-CM | POA: Diagnosis not present

## 2015-06-08 DIAGNOSIS — F419 Anxiety disorder, unspecified: Principal | ICD-10-CM

## 2015-06-08 DIAGNOSIS — Z09 Encounter for follow-up examination after completed treatment for conditions other than malignant neoplasm: Secondary | ICD-10-CM

## 2015-06-08 DIAGNOSIS — G8929 Other chronic pain: Secondary | ICD-10-CM

## 2015-06-08 DIAGNOSIS — F32A Depression, unspecified: Secondary | ICD-10-CM

## 2015-06-08 MED ORDER — CLONAZEPAM 0.5 MG PO TABS: 0.5000 mg | ORAL_TABLET | Freq: Three times a day (TID) | ORAL | Status: DC | PRN

## 2015-06-08 MED ORDER — ONDANSETRON HCL 8 MG PO TABS: 8.0000 mg | ORAL_TABLET | Freq: Three times a day (TID) | ORAL | Status: DC | PRN

## 2015-06-08 MED ORDER — PANTOPRAZOLE SODIUM 40 MG PO TBEC: 40.0000 mg | DELAYED_RELEASE_TABLET | Freq: Every day | ORAL | Status: DC

## 2015-06-08 MED ORDER — ESCITALOPRAM OXALATE 20 MG PO TABS: 20.0000 mg | ORAL_TABLET | Freq: Every day | ORAL | Status: DC

## 2015-06-08 NOTE — Patient Instructions (Signed)
GO TO THE FRONT DESK Schedule your next appointment for a  Check up When?   3 months Fasting?  No     STOP BY THE FIRST FLOOR:  get the XR

## 2015-06-08 NOTE — Progress Notes (Signed)
Subjective:    Patient ID: Rebecca [name], female    DOB: [birth year], 75 y.o.   MRN: [id number]  DOS:  06/08/2015 Type of visit - description : rov Interval history: Anxiety depression, good compliance with Lexapro 20 mg daily. Emotionally doing much better, smiling again. Husband who is here confirm that statement. On the other hand she continue to lose weight. Somewhat concerned about it. In retrospect she now reports a three-month history of epigastric pain, worse with foods. Her daughter gave her Dexilant and Zofran yesterday and she felt very well after taking that medication.  Wt Readings from Last 3 Encounters:  06/08/15 141 lb 4 oz (64.071 kg)  05/11/15 146 lb 2 oz (66.282 kg)  03/30/15 153 lb (69.4 kg)     Review of Systems Denies vomiting, diarrhea, blood in the stools or change in the color of the stools but she does have some nausea from time to time. Denies any night sweats.  Past Medical History  Diagnosis Date  . Hyperlipidemia   . Osteoporosis   . Chronic neck pain   . Hypertension     No longer takes med.  Marland Kitchen Dysphagia     esophageal stretched 9-14   . Arthritis     degenerative lumbar spine   . History of blood transfusion 2009    post - lumbar fusion   . Osteoarthritis of left shoulder region     Mild, Dr. Mardelle Matte  . Complication of anesthesia     at one time my blood pressure dropped -lumbar - on bp med at that time    Past Surgical History  Procedure Laterality Date  . Cholecystectomy    . Abdominal hysterectomy    . Tonsillectomy    . Oophorectomy Bilateral   . Shoulder surgery Bilateral     x 2/Dr. Collier Salina  . Back surgery  2009    Dr Saintclair Halsted  . Neck surgery  05/2008    Dr Saintclair Halsted  . Knee surgery  9-11    right,  scope; left knee  . Arm surgery Bilateral     transposed ulner nerve 10 (dr. Amedeo Plenty)  . Carpal tunnell  2006    bilateral  . Rhinoplasty  1980  . Partial knee arthroplasty  01/17/2012    LEFT KNEE  . Partial knee arthroplasty   01/17/2012    Procedure: UNICOMPARTMENTAL KNEE;  Surgeon: Johnny Bridge, MD;  Location: [facility];  Service: Orthopedics;  Laterality: Left;  . Back surgery  01-2014    Dr Saintclair Halsted  . Anterior cervical decomp/discectomy fusion N/A 11/20/2014    Procedure: ANTERIOR CERVICAL DECOMPRESSION/DISCECTOMY FUSION 2 LEVELS;  Surgeon: Phylliss Bob, MD;  Location: [facility];  Service: Orthopedics;  Laterality: N/A;  Anterior cervical decompression fusion, cervical 3-4, cervical 4-5 with instrumentation and allograft    Social History   Social History  . Marital Status: Married    Spouse Name: N/A  . Number of Children: 4  . Years of Education: N/A   Occupational History  . stay home     Social History Main Topics  . Smoking status: Never Smoker   . Smokeless tobacco: Never Used  . Alcohol Use: 0.0 oz/week    0 Glasses of wine per week     Comment: rare  . Drug Use: No  . Sexual Activity: Not Currently   Other Topics Concern  . Not on file   Social History Narrative   Husband has cancer    1 child lives  in this area         Medication List       This list is accurate as of: 06/08/15 11:59 PM.  Always use your most recent med list.               calcium-vitamin D 250-125 MG-UNIT tablet  Commonly known as:  OSCAL WITH D  Take 1 tablet by mouth 2 (two) times daily.     clonazePAM 0.5 MG tablet  Commonly known as:  KLONOPIN  Take 1 tablet (0.5 mg total) by mouth 3 (three) times daily as needed for anxiety.     D3-1000 PO  Take 1,000 Units by mouth daily.     escitalopram 20 MG tablet  Commonly known as:  LEXAPRO  Take 1 tablet (20 mg total) by mouth daily.     fish oil-omega-3 fatty acids 1000 MG capsule  Take 1 g by mouth 3 (three) times daily.     multivitamin tablet  Take 1 tablet by mouth daily.     multivitamin-lutein Caps capsule  Take 1 capsule by mouth daily.     ondansetron 8 MG tablet  Commonly known as:  ZOFRAN  Take 1 tablet (8 mg total) by mouth every 8  (eight) hours as needed for nausea or vomiting.     pantoprazole 40 MG tablet  Commonly known as:  PROTONIX  Take 1 tablet (40 mg total) by mouth daily.     simvastatin 40 MG tablet  Commonly known as:  ZOCOR  Take 1 tablet (40 mg total) by mouth every evening.           Objective:   Physical Exam BP 122/78 mmHg  Pulse 71  Temp(Src) 97.8 F (36.6 C) (Oral)  Ht 5' (1.524 m)  Wt 141 lb 4 oz (64.071 kg)  BMI 27.59 kg/m2  SpO2 97% General:   Well developed, well nourished . NAD.  HEENT:  Normocephalic . Face symmetric, atraumatic Neck and supraclavicular area: No mass or LADs Lungs:  CTA B Normal respiratory effort, no intercostal retractions, no accessory muscle use. Heart: RRR,  no murmur.  no pretibial edema bilaterally  Abdomen:  Not distended, soft, no sign tenderness of the epigastric area without mass or rebound. Palpable aorta (not new).  Skin: Not pale. Not jaundice Neurologic:  alert & oriented X3.  Speech normal, gait appropriate for age and unassisted Psych--  Cognition and judgment appear intact.  Cooperative with normal attention span and concentration.  Behavior appropriate. No anxious or depressed appearing.    Assessment & Plan:   Assessment> HTN  Hyperlipidemia Anxiety depression: onset Fall 2016 d/t husband health, lost sister 03-2015. rx lexapro 02-2015 GERD Esophageal stricture stretched 2014  MSK: --DJD --Chronic neck pain  --spinal stenosis --multiple surgeries Osteoporosis DEXA osteoprosis 2006 (per pt) DEXA 11-08 normal  DEXA 9-12 mild osteopenia, rec exercise, ca and vit D Ao [place] 03-2015 - no AAA  Plan: Anxiety depression: Responding well to Lexapro 20 mg, taking clonazepam sporadically. Doing well. Weight loss: Still losing weight despite feeling better emotionally and eating better; today reports for the first time epigastric pain as described above. Recent labs showed no anemia, thyroid disease or diabetes. For completeness  we will get a chest x-ray. Needs further eval for epigastric pain. Epigastric pain: Start pantoprazole, Zofran as needed, GI referral. EGD?. RTC 3 months

## 2015-06-08 NOTE — Progress Notes (Signed)
Pre visit review using our clinic review tool, if applicable. No additional management support is needed unless otherwise documented below in the visit note. 

## 2015-06-10 NOTE — Assessment & Plan Note (Signed)
Anxiety depression: Responding well to Lexapro 20 mg, taking clonazepam sporadically. Doing well. Weight loss: Still losing weight despite feeling better emotionally and eating better; today reports for the first time epigastric pain as described above. Recent labs showed no anemia, thyroid disease or diabetes. For completeness we will get a chest x-ray. Needs further eval for epigastric pain. Epigastric pain: Start pantoprazole, Zofran as needed, GI referral. EGD?. RTC 3 months

## 2015-06-12 ENCOUNTER — Encounter: Payer: Self-pay | Admitting: Gastroenterology

## 2015-06-12 ENCOUNTER — Other Ambulatory Visit (INDEPENDENT_AMBULATORY_CARE_PROVIDER_SITE_OTHER): Payer: PPO

## 2015-06-12 ENCOUNTER — Ambulatory Visit (INDEPENDENT_AMBULATORY_CARE_PROVIDER_SITE_OTHER): Payer: PPO | Admitting: Gastroenterology

## 2015-06-12 VITALS — BP 140/70 | HR 80 | Ht 60.0 in | Wt 141.0 lb

## 2015-06-12 DIAGNOSIS — G8929 Other chronic pain: Secondary | ICD-10-CM

## 2015-06-12 DIAGNOSIS — F5000 Anorexia nervosa, unspecified: Secondary | ICD-10-CM

## 2015-06-12 DIAGNOSIS — R1013 Epigastric pain: Secondary | ICD-10-CM

## 2015-06-12 DIAGNOSIS — R634 Abnormal weight loss: Secondary | ICD-10-CM

## 2015-06-12 LAB — BASIC METABOLIC PANEL
BUN: 20 mg/dL (ref 6–23)
CHLORIDE: 107 meq/L (ref 96–112)
CO2: 28 meq/L (ref 19–32)
CREATININE: 1.12 mg/dL (ref 0.40–1.20)
Calcium: 9.9 mg/dL (ref 8.4–10.5)
GFR: 50.4 mL/min — ABNORMAL LOW (ref >60.00)
Glucose, Bld: 95 mg/dL (ref 70–99)
Potassium: 4.1 mEq/L (ref 3.5–5.1)
Sodium: 142 mEq/L (ref 135–145)

## 2015-06-12 NOTE — Patient Instructions (Addendum)
If you are age 75 or older, your body mass index should be between 23-30. Your Body mass index is 27.54 kg/(m^2). If this is out of the aforementioned range listed, please consider follow up with your Primary Care Provider.  If you are age 75 or younger, your body mass index should be between 19-25. Your Body mass index is 27.54 kg/(m^2). If this is out of the aformentioned range listed, please consider follow up with your Primary Care Provider.   Your physician has requested that you go to the basement for the following lab work before leaving today: BMET   You have been scheduled for a CT scan of the abdomen and pelvis at [facility] ([address]---this is in the same building as [facility]).   You are scheduled on 06-18-2015 at 3pm. You should arrive 15 minutes prior to your appointment time for registration. Please follow the written instructions below on the day of your exam:  WARNING: IF YOU ARE ALLERGIC TO IODINE/X-RAY DYE, PLEASE NOTIFY RADIOLOGY IMMEDIATELY AT [phone]! YOU WILL BE GIVEN A 13 HOUR PREMEDICATION PREP.  1) Do not eat or drink anything after 11am (4 hours prior to your test) 2) You have been given 2 bottles of oral contrast to drink. The solution may taste               better if refrigerated, but do NOT add ice or any other liquid to this solution. Shake             well before drinking.    Drink 1 bottle of contrast @ 1pm (2 hours prior to your exam)  Drink 1 bottle of contrast @ 2pm (1 hour prior to your exam)  You may take any medications as prescribed with a small amount of water except for the following: Metformin, Glucophage, Glucovance, Avandamet, Riomet, Fortamet, Actoplus Met, Janumet, Glumetza or Metaglip. The above medications must be held the day of the exam AND 48 hours after the exam.  The purpose of you drinking the oral contrast is to aid in the visualization of your intestinal tract. The contrast solution may cause some diarrhea.  Before your exam is started, you will be given a small amount of fluid to drink. Depending on your individual set of symptoms, you may also receive an intravenous injection of x-ray contrast/dye. Plan on being at [facility] for 30 minutes or longer, depending on the type of exam you are having performed.  This test typically takes 30-45 minutes to complete.  If you have any questions regarding your exam or if you need to reschedule, you may call the CT department at [phone] between the hours of 8:00 am and 5:00 pm, Monday-Friday.  ________________________________________________________________________   Dennis Bast have been scheduled for an endoscopy. Please follow written instructions given to you at your visit today. If you use inhalers (even only as needed), please bring them with you on the day of your procedure. Your physician has requested that you go to www.startemmi.com and enter the access code given to you at your visit today. This web site gives a general overview about your procedure. However, you should still follow specific instructions given to you by our office regarding your preparation for the procedure.

## 2015-06-12 NOTE — Progress Notes (Signed)
[facility] Progress Note  Chief Complaint: Epigastric pain and weight loss.  Subjective History:  This is Rebecca [name]'s first office visit with me, she previously saw Dr. [name] in September 2014 for dysphagia. An upper endoscopy at that time was normal except for mild esophageal ring treated with a Maloney dilation. A colonoscopy in February 2014 was normal except for diverticulosis.  She describes about 9 months of epigastric pain that is somewhat difficult to characterize. It began only with certain foods like lettuce and apple peels. He was always a burning but then sometimes progressed to a sharp epigastric pain, and has become more frequent and intense. There are no other consistent food triggers, in fact the pain does not consistently come on with eating. She might wake in the morning feeling well, but soon after developed the pain or just become nauseated the thought of food, then lose her appetite and eat very little all day.  Her husband is with her today, and corroborates that the pain did seem to start last summer after the discovered he had a recurrence of his myeloma. There has been additional stress because they're unable to afford it is very expensive treatment. Rebecca [name] a she has lost 100 pounds in the last 2 years, which predates the onset of this pain, but apparently the weight loss has escalated in the last 6 months. She has early satiety, nausea, denies vomiting change in bowel habits or rectal bleeding.  ROS: Cardiovascular:  no chest pain Respiratory: no dyspnea  The patient's Past Medical, Family and Social History were reviewed and are on file in the EMR.  Objective:  Med list reviewed  Vital signs in last 24 hrs: Filed Vitals:   06/12/15 1542  BP: 140/70  Pulse: 80    Physical Exam Poor muscle mass and temporal wasting  HEENT: sclera anicteric, oral mucosa moist without lesions  Neck: supple, no thyromegaly, JVD or lymphadenopathy  Cardiac: RRR without  murmurs, S1S2 heard, no peripheral edema  Pulm: clear to auscultation bilaterally, normal RR and effort noted  Abdomen: soft, Moderate epigastric tenderness to light palpation of the abdominal wall, with active bowel sounds. No guarding or palpable hepatosplenomegaly.  Skin; warm and dry, no jaundice or rash  Recent Labs:  CMP Latest Ref Rng 11/18/2014 03/26/2014 01/16/2014  Glucose 65 - 99 mg/dL 86 - 99  BUN 6 - 20 mg/dL 14 - 10  Creatinine 0.44 - 1.00 mg/dL 0.99 - 0.78  Sodium 135 - 145 mmol/L 139 - 140  Potassium 3.5 - 5.1 mmol/L 3.7 - 4.1  Chloride 101 - 111 mmol/L 108 - 102  CO2 22 - 32 mmol/L 24 - 25  Calcium 8.9 - 10.3 mg/dL 9.6 - 10.2  Total Protein 6.5 - 8.1 g/dL 6.9 - -  Total Bilirubin 0.3 - 1.2 mg/dL 0.5 - -  Alkaline Phos 38 - 126 U/L 45 - -  AST 15 - 41 U/L 28 19 -  ALT 14 - 54 U/L 19 13 -   Lab Results  Component Value Date   WBC 4.9 05/11/2015   HGB 13.0 05/11/2015   HCT 38.1 05/11/2015   MCV 92.3 05/11/2015   PLT 144.0* 05/11/2015     Radiologic studies:  nml CXR this week  @ASSESSMENTPLANBEGIN @ Assessment: Encounter Diagnoses  Name Primary?  . Abdominal pain, chronic, epigastric Yes  . Abnormal loss of weight   . Anorexia nervosa    The cause of symptoms is unclear. While she is clearly under significant stress, and was  tearful at times during the encounter, the degree of symptoms and weight loss warrant a workup for organic causes. We will must rule out peptic ulcer and malignancy most notably. This does not sound typical for intestinal angina, and she is not a smoker.   Plan: CT scan abdomen and pelvis with contrast Upper endoscopy within the next 2 weeks.  The benefits and risks of the planned procedure were described in detail with the patient or (when appropriate) their health care proxy.  Risks were outlined as including, but not limited to, bleeding, infection, perforation, adverse medication reaction leading to cardiac or pulmonary  decompensation, or pancreatitis (if ERCP).  The limitation of incomplete mucosal visualization was also discussed.  No guarantees or warranties were given.    Nelida Meuse III

## 2015-06-18 ENCOUNTER — Ambulatory Visit (INDEPENDENT_AMBULATORY_CARE_PROVIDER_SITE_OTHER)
Admission: RE | Admit: 2015-06-18 | Discharge: 2015-06-18 | Disposition: A | Discharge status: Home / Self Care | Payer: PPO | Source: Ambulatory Visit | Attending: Gastroenterology | Admitting: Gastroenterology

## 2015-06-18 ENCOUNTER — Telehealth: Payer: Self-pay

## 2015-06-18 DIAGNOSIS — R634 Abnormal weight loss: Secondary | ICD-10-CM

## 2015-06-18 DIAGNOSIS — R1013 Epigastric pain: Secondary | ICD-10-CM | POA: Diagnosis not present

## 2015-06-18 DIAGNOSIS — K429 Umbilical hernia without obstruction or gangrene: Secondary | ICD-10-CM | POA: Diagnosis not present

## 2015-06-18 DIAGNOSIS — G8929 Other chronic pain: Secondary | ICD-10-CM

## 2015-06-18 DIAGNOSIS — F5000 Anorexia nervosa, unspecified: Secondary | ICD-10-CM

## 2015-06-18 MED ORDER — IOPAMIDOL (ISOVUE-300) INJECTION 61%
100.0000 mL | Freq: Once | INTRAVENOUS | Status: AC | PRN
  Administered 2015-06-18: 100 mL via INTRAVENOUS

## 2015-06-18 NOTE — Telephone Encounter (Signed)
UDS: 06/18/2015  Positive for Clonazepam   Low risk per Dr. Larose Kells 06/18/2015

## 2015-06-25 ENCOUNTER — Ambulatory Visit (AMBULATORY_SURGERY_CENTER): Payer: PPO | Admitting: Gastroenterology

## 2015-06-25 ENCOUNTER — Encounter: Payer: Self-pay | Admitting: Gastroenterology

## 2015-06-25 VITALS — BP 136/64 | HR 52 | Temp 98.0°F | Resp 12 | Ht 60.0 in | Wt 141.0 lb

## 2015-06-25 DIAGNOSIS — R1013 Epigastric pain: Secondary | ICD-10-CM

## 2015-06-25 DIAGNOSIS — K3189 Other diseases of stomach and duodenum: Secondary | ICD-10-CM | POA: Diagnosis not present

## 2015-06-25 DIAGNOSIS — G8929 Other chronic pain: Secondary | ICD-10-CM | POA: Diagnosis not present

## 2015-06-25 DIAGNOSIS — I10 Essential (primary) hypertension: Secondary | ICD-10-CM | POA: Diagnosis not present

## 2015-06-25 DIAGNOSIS — R634 Abnormal weight loss: Secondary | ICD-10-CM | POA: Diagnosis not present

## 2015-06-25 MED ORDER — SODIUM CHLORIDE 0.9 % IV SOLN: 500.0000 mL | INTRAVENOUS | Status: DC

## 2015-06-25 NOTE — Patient Instructions (Signed)
YOU HAD AN ENDOSCOPIC PROCEDURE TODAY AT THE  ENDOSCOPY CENTER:   Refer to the procedure report that was given to you for any specific questions about what was found during the examination.  If the procedure report does not answer your questions, please call your gastroenterologist to clarify.  If you requested that your care partner not be given the details of your procedure findings, then the procedure report has been included in a sealed envelope for you to review at your convenience later.  YOU SHOULD EXPECT: Some feelings of bloating in the abdomen. Passage of more gas than usual.  Walking can help get rid of the air that was put into your GI tract during the procedure and reduce the bloating. If you had a lower endoscopy (such as a colonoscopy or flexible sigmoidoscopy) you may notice spotting of blood in your stool or on the toilet paper. If you underwent a bowel prep for your procedure, you may not have a normal bowel movement for a few days.  Please Note:  You might notice some irritation and congestion in your nose or some drainage.  This is from the oxygen used during your procedure.  There is no need for concern and it should clear up in a day or so.  SYMPTOMS TO REPORT IMMEDIATELY:    Following upper endoscopy (EGD)  Vomiting of blood or coffee ground material  New chest pain or pain under the shoulder blades  Painful or persistently difficult swallowing  New shortness of breath  Fever of 100F or higher  Black, tarry-looking stools  For urgent or emergent issues, a gastroenterologist can be reached at any hour by calling [phone].   DIET: Your first meal following the procedure should be a small meal and then it is ok to progress to your normal diet. Heavy or fried foods are harder to digest and may make you feel nauseous or bloated.  Likewise, meals heavy in dairy and vegetables can increase bloating.  Drink plenty of fluids but you should avoid alcoholic beverages  for 24 hours.  ACTIVITY:  You should plan to take it easy for the rest of today and you should NOT DRIVE or use heavy machinery until tomorrow (because of the sedation medicines used during the test).    FOLLOW UP: Our staff will call the number listed on your records the next business day following your procedure to check on you and address any questions or concerns that you may have regarding the information given to you following your procedure. If we do not reach you, we will leave a message.  However, if you are feeling well and you are not experiencing any problems, there is no need to return our call.  We will assume that you have returned to your regular daily activities without incident.  If any biopsies were taken you will be contacted by phone or by letter within the next 1-3 weeks.  Please call us at [phone] if you have not heard about the biopsies in 3 weeks.    SIGNATURES/CONFIDENTIALITY: You and/or your care partner have signed paperwork which will be entered into your electronic medical record.  These signatures attest to the fact that that the information above on your After Visit Summary has been reviewed and is understood.  Full responsibility of the confidentiality of this discharge information lies with you and/or your care-partner. 

## 2015-06-25 NOTE — Progress Notes (Signed)
Called to room to assist during endoscopic procedure.  Patient ID and intended procedure confirmed with present staff. Received instructions for my participation in the procedure from the performing physician.  

## 2015-06-25 NOTE — Op Note (Signed)
[facility] Patient Name: Rebecca [name] Procedure Date: 06/25/2015 2:51 PM MRN: [id number] Endoscopist: [place]. Loletha Carrow , MD Age: 75 Date of Birth: 02-19-1941 Gender: Female Procedure:                Upper GI endoscopy Indications:              Epigastric abdominal pain, Weight loss Medicines:                Monitored Anesthesia Care Procedure:                Pre-Anesthesia Assessment:                           - Prior to the procedure, a History and Physical                            was performed, and patient medications and                            allergies were reviewed. The patient's tolerance of                            previous anesthesia was also reviewed. The risks                            and benefits of the procedure and the sedation                            options and risks were discussed with the patient.                            All questions were answered, and informed consent                            was obtained. Prior Anticoagulants: The patient has                            taken no previous anticoagulant or antiplatelet                            agents. ASA Grade Assessment: II - A patient with                            mild systemic disease. After reviewing the risks                            and benefits, the patient was deemed in                            satisfactory condition to undergo the procedure.                           After obtaining informed consent, the endoscope was  passed under direct vision. Throughout the                            procedure, the patient's blood pressure, pulse, and                            oxygen saturations were monitored continuously. The                            Model GIF-[id number] scope was introduced                            through the mouth, and advanced to the second part                            of duodenum. The upper GI endoscopy was                    accomplished without difficulty. The patient                            tolerated the procedure well. Scope In: Scope Out: Findings:                 The esophagus was normal.                           Localized moderate inflammation characterized by                            erythema was found on the greater curvature of the                            gastric antrum. Biopsies were taken with a cold                            forceps for histology.                           The examined duodenum was normal. Complications:            No immediate complications. Estimated Blood Loss:     Estimated blood loss: none. Impression:               - Normal esophagus.                           - Chronic gastritis. Biopsied. Very unlikely to be                            the cause of symptoms.                           - Normal examined duodenum.                           - No cause for symptoms was seen. Recent CT scan  abdomen/palvis was also unrevealing for a cause of                            symptoms.                           There is felt to be a significant element of                            anxiety contributing to the symptoms. Recommendation:           - Resume previous diet.                           - Continue present medications.                           - Await pathology results.                           - No repeat upper endoscopy. Henry L. Loletha Carrow, MD 06/25/2015 3:16:40 PM This report has been signed electronically.

## 2015-06-25 NOTE — Progress Notes (Signed)
To recovery, report to Ty[place], RN, VSS[facility]

## 2015-06-29 ENCOUNTER — Telehealth: Payer: Self-pay | Admitting: *Deleted

## 2015-06-29 NOTE — Telephone Encounter (Signed)
  Follow up Call-  Call back number 06/25/2015 12/10/2012  Post procedure Call Back phone  # [phone]  Permission to leave phone message Yes Yes     Patient questions:  Do you have a fever, pain , or abdominal swelling? No. Pain Score  0 *  Have you tolerated food without any problems? Yes.    Have you been able to return to your normal activities? Yes.    Do you have any questions about your discharge instructions: Diet   No. Medications  No. Follow up visit  No.  Do you have questions or concerns about your Care? No.  Actions: * If pain score is 4 or above: No action needed, pain <4.

## 2015-07-02 ENCOUNTER — Encounter: Payer: Self-pay | Admitting: Gastroenterology

## 2015-08-22 ENCOUNTER — Encounter (HOSPITAL_BASED_OUTPATIENT_CLINIC_OR_DEPARTMENT_OTHER): Payer: Self-pay | Admitting: *Deleted

## 2015-08-22 ENCOUNTER — Emergency Department (HOSPITAL_BASED_OUTPATIENT_CLINIC_OR_DEPARTMENT_OTHER)
Admission: EM | Admit: 2015-08-22 | Discharge: 2015-08-22 | Disposition: A | Discharge status: Home / Self Care | Payer: PPO | Attending: Emergency Medicine | Admitting: Emergency Medicine

## 2015-08-22 DIAGNOSIS — I951 Orthostatic hypotension: Secondary | ICD-10-CM

## 2015-08-22 DIAGNOSIS — R5383 Other fatigue: Secondary | ICD-10-CM | POA: Diagnosis not present

## 2015-08-22 DIAGNOSIS — R5381 Other malaise: Secondary | ICD-10-CM

## 2015-08-22 DIAGNOSIS — E785 Hyperlipidemia, unspecified: Secondary | ICD-10-CM | POA: Diagnosis not present

## 2015-08-22 DIAGNOSIS — M1990 Unspecified osteoarthritis, unspecified site: Secondary | ICD-10-CM | POA: Insufficient documentation

## 2015-08-22 DIAGNOSIS — Z8679 Personal history of other diseases of the circulatory system: Secondary | ICD-10-CM | POA: Diagnosis not present

## 2015-08-22 DIAGNOSIS — R55 Syncope and collapse: Secondary | ICD-10-CM | POA: Diagnosis not present

## 2015-08-22 DIAGNOSIS — Z79899 Other long term (current) drug therapy: Secondary | ICD-10-CM | POA: Diagnosis not present

## 2015-08-22 LAB — CBC
HEMATOCRIT: 40.5 % (ref 36.0–46.0)
HEMOGLOBIN: 13.9 g/dL (ref 12.0–15.0)
MCH: 31.8 pg (ref 26.0–34.0)
MCHC: 34.3 g/dL (ref 30.0–36.0)
MCV: 92.7 fL (ref 78.0–100.0)
Platelets: 148 10*3/uL — ABNORMAL LOW (ref 150–400)
RBC: 4.37 MIL/uL (ref 3.87–5.11)
RDW: 12.4 % (ref 11.5–15.5)
WBC: 4.8 10*3/uL (ref 4.0–10.5)

## 2015-08-22 LAB — URINE MICROSCOPIC-ADD ON: RBC / HPF: NONE SEEN RBC/hpf (ref 0–5)

## 2015-08-22 LAB — COMPREHENSIVE METABOLIC PANEL
ALBUMIN: 4.4 g/dL (ref 3.5–5.0)
ALK PHOS: 47 U/L (ref 38–126)
ALT: 34 U/L (ref 14–54)
ANION GAP: 10 (ref 5–15)
AST: 49 U/L — ABNORMAL HIGH (ref 15–41)
BILIRUBIN TOTAL: 1.2 mg/dL (ref 0.3–1.2)
BUN: 20 mg/dL (ref 6–20)
CALCIUM: 9.9 mg/dL (ref 8.9–10.3)
CO2: 23 mmol/L (ref 22–32)
CREATININE: 1.02 mg/dL — AB (ref 0.44–1.00)
Chloride: 106 mmol/L (ref 101–111)
GFR calc Af Amer: >60 mL/min (ref >60)
GFR calc non Af Amer: 52 mL/min — ABNORMAL LOW (ref >60)
GLUCOSE: 100 mg/dL — AB (ref 65–99)
Potassium: 3.2 mmol/L — ABNORMAL LOW (ref 3.5–5.1)
Sodium: 139 mmol/L (ref 135–145)
TOTAL PROTEIN: 7 g/dL (ref 6.5–8.1)

## 2015-08-22 LAB — URINALYSIS, ROUTINE W REFLEX MICROSCOPIC
BILIRUBIN URINE: NEGATIVE
Glucose, UA: NEGATIVE mg/dL
HGB URINE DIPSTICK: NEGATIVE
Ketones, ur: NEGATIVE mg/dL
NITRITE: NEGATIVE
PH: 7 (ref 5.0–8.0)
Protein, ur: NEGATIVE mg/dL
SPECIFIC GRAVITY, URINE: 1.018 (ref 1.005–1.030)

## 2015-08-22 LAB — TROPONIN I: Troponin I: <0.03 ng/mL (ref <0.031)

## 2015-08-22 MED ORDER — SODIUM CHLORIDE 0.9 % IV BOLUS (SEPSIS)
500.0000 mL | Freq: Once | INTRAVENOUS | Status: AC
  Administered 2015-08-22: 500 mL via INTRAVENOUS

## 2015-08-22 MED ORDER — POTASSIUM CHLORIDE CRYS ER 20 MEQ PO TBCR
20.0000 meq | EXTENDED_RELEASE_TABLET | Freq: Once | ORAL | Status: AC
Start: 2015-08-22 — End: 2015-08-22
  Administered 2015-08-22: 20 meq via ORAL
  Filled 2015-08-22: qty 1

## 2015-08-22 NOTE — ED Notes (Signed)
MD at bedside. 

## 2015-08-22 NOTE — ED Notes (Signed)
MD at bedside discussing lab results with pt.

## 2015-08-22 NOTE — ED Provider Notes (Signed)
CSN: [id number]     Arrival date & time 08/22/15  1549 History  By signing my name below, I, Randa Evens, attest that this documentation has been prepared under the direction and in the presence of Quintella Reichert, MD. Electronically Signed: Randa Evens, ED Scribe. 08/22/2015. 4:26 PM.      Chief Complaint  Patient presents with  . Near Syncope   Patient is a 75 y.o. female presenting with near-syncope. The history is provided by the patient. No language interpreter was used.  Near Syncope   HPI Comments: Rebecca [name] is a 75 y.o. female who presents to the Emergency Department complaining of syncopal episode 3 weeks prior. Pt states that she has been having these syncopal episodes more frequently. Pt reports that her last syncopal episode was 2 days prior.   Pt reports that she has a Hx of gastritis. Pt reports that she has unexpected weight loss and loss of appetite. She states that since the onset of her symptoms she has been feeling weaker than normal and fatigues.Denies fever, CP, vomiting, diarrhea, blood in stool, leg swelling or dysuria. Symptoms are moderate, waxing and waning, worsening.  Past Medical History  Diagnosis Date  . Hyperlipidemia   . Osteoporosis   . Chronic neck pain   . Hypertension     No longer takes med.  Marland Kitchen Dysphagia     esophageal stretched 9-14   . Arthritis     degenerative lumbar spine   . History of blood transfusion 2009    post - lumbar fusion   . Osteoarthritis of left shoulder region     Mild, Dr. Mardelle Matte  . Complication of anesthesia     at one time my blood pressure dropped -lumbar - on bp med at that time  . Depression   . Anxiety    Past Surgical History  Procedure Laterality Date  . Cholecystectomy    . Abdominal hysterectomy    . Tonsillectomy    . Oophorectomy Bilateral   . Shoulder surgery Bilateral     x 2/Dr. Collier Salina  . Back surgery  2009    Dr Saintclair Halsted  . Neck surgery  05/2008    Dr Saintclair Halsted  . Knee surgery  9-11     right,  scope; left knee  . Arm surgery Bilateral     transposed ulner nerve 10 (dr. Amedeo Plenty)  . Carpal tunnell  2006    bilateral  . Rhinoplasty  1980  . Partial knee arthroplasty  01/17/2012    LEFT KNEE  . Partial knee arthroplasty  01/17/2012    Procedure: UNICOMPARTMENTAL KNEE;  Surgeon: Johnny Bridge, MD;  Location: [facility];  Service: Orthopedics;  Laterality: Left;  . Back surgery  01-2014    Dr Saintclair Halsted  . Anterior cervical decomp/discectomy fusion N/A 11/20/2014    Procedure: ANTERIOR CERVICAL DECOMPRESSION/DISCECTOMY FUSION 2 LEVELS;  Surgeon: Phylliss Bob, MD;  Location: [facility];  Service: Orthopedics;  Laterality: N/A;  Anterior cervical decompression fusion, cervical 3-4, cervical 4-5 with instrumentation and allograft  . Colonoscopy     Family History  Problem Relation Age of Onset  . Colon cancer Neg Hx   . Breast cancer Neg Hx   . Prostate cancer Father   . Diabetes Sister   . Heart disease Sister     sister and brothers x 2  . Kidney disease Sister   . Kidney disease Brother   . Stomach cancer Maternal Aunt    Social History  Substance Use  Topics  . Smoking status: Never Smoker   . Smokeless tobacco: Never Used  . Alcohol Use: 0.0 oz/week    0 Glasses of wine per week     Comment: rare   OB History    No data available     Review of Systems  Cardiovascular: Positive for near-syncope.  A complete 10 system review of systems was obtained and all systems are negative except as noted in the HPI and PMH.     Allergies  Tape; Alendronate sodium; Cortizone-10; Oxycodone-acetaminophen; and Penicillins  Home Medications   Prior to Admission medications   Medication Sig Start Date End Date Taking? Authorizing Provider  calcium-vitamin D (OSCAL WITH D) 250-125 MG-UNIT per tablet Take 1 tablet by mouth 2 (two) times daily.    Historical Provider, MD  Cholecalciferol (D3-1000 PO) Take 1,000 Units by mouth daily.      Historical Provider, MD  clonazePAM (KLONOPIN)  0.5 MG tablet Take 1 tablet (0.5 mg total) by mouth 3 (three) times daily as needed for anxiety. 06/08/15   Colon Branch, MD  escitalopram (LEXAPRO) 20 MG tablet Take 1 tablet (20 mg total) by mouth daily. 06/08/15   Colon Branch, MD  fish oil-omega-3 fatty acids 1000 MG capsule Take 1 g by mouth 3 (three) times daily.     Historical Provider, MD  Multiple Vitamin (MULTIVITAMIN) tablet Take 1 tablet by mouth daily.    Historical Provider, MD  multivitamin-lutein (OCUVITE-LUTEIN) CAPS capsule Take 1 capsule by mouth daily.    Historical Provider, MD  ondansetron (ZOFRAN) 8 MG tablet Take 1 tablet (8 mg total) by mouth every 8 (eight) hours as needed for nausea or vomiting. Patient not taking: Reported on 06/25/2015 06/08/15   Colon Branch, MD  pantoprazole (PROTONIX) 40 MG tablet Take 1 tablet (40 mg total) by mouth daily. 06/08/15   Colon Branch, MD  simvastatin (ZOCOR) 40 MG tablet Take 1 tablet (40 mg total) by mouth every evening. 04/02/15   Colon Branch, MD   BP 152/64 mmHg  Pulse 55  Temp(Src) 98.4 F (36.9 C) (Oral)  Resp 15  Ht 5' (1.524 m)  Wt 129 lb 9 oz (58.769 kg)  BMI 25.30 kg/m2  SpO2 100%   Physical Exam  Constitutional: She is oriented to person, place, and time. She appears well-developed and well-nourished. No distress.  HENT:  Head: Normocephalic and atraumatic.  Cardiovascular: Normal rate and regular rhythm.   No murmur heard. Pulmonary/Chest: Effort normal and breath sounds normal. No respiratory distress.  Abdominal: Soft. There is no rebound and no guarding.  Mild epigastric tenderness.  Musculoskeletal: She exhibits no edema or tenderness.  Neurological: She is alert and oriented to person, place, and time.  Skin: Skin is warm and dry.  Psychiatric: She has a normal mood and affect. Her behavior is normal.  Nursing note and vitals reviewed.   ED Course  Procedures  DIAGNOSTIC STUDIES: Oxygen Saturation is 100% on RA, normal by my interpretation.    COORDINATION OF  CARE: 4:26 PM-Discussed treatment plan with pt at bedside and pt agreed to plan.     Labs Review Labs Reviewed  CBC - Abnormal; Notable for the following:    Platelets 148 (*)    All other components within normal limits  URINALYSIS, ROUTINE W REFLEX MICROSCOPIC (NOT AT [facility]) - Abnormal; Notable for the following:    APPearance CLOUDY (*)    Leukocytes, UA TRACE (*)    All other components  within normal limits  COMPREHENSIVE METABOLIC PANEL - Abnormal; Notable for the following:    Potassium 3.2 (*)    Glucose, Bld 100 (*)    Creatinine, Ser 1.02 (*)    AST 49 (*)    GFR calc non Af Amer 52 (*)    All other components within normal limits  URINE MICROSCOPIC-ADD ON - Abnormal; Notable for the following:    Squamous Epithelial / LPF 0-5 (*)    Bacteria, UA RARE (*)    All other components within normal limits  TROPONIN I    Imaging Review No results found.    EKG Interpretation   Date/Time:  Saturday August 22 2015 16:15:41 EDT Ventricular Rate:  65 PR Interval:  164 QRS Duration: 100 QT Interval:  454 QTC Calculation: 472 R Axis:   50 Text Interpretation:  Sinus rhythm Low voltage, precordial leads Confirmed  by Hazle Coca [phone]) on 08/22/2015 4:24:17 PM      MDM   Final diagnoses:  Orthostatic hypotension  Malaise and fatigue   Patient here for progressive weight loss, fatigue, intermittent episodes of syncope. BMP was stable renal insufficiency and mild hyperkalemia. She is orthostatic in the department. No evidence of acute infectious process. Presentation is not consistent with PE, ACS. Provided IV fluids for her orthostasis. Discussed with patient home care for malaise and fatigue. Recommend oral fluid hydration and encouraging frequent small meals. Outpatient follow-up and return precautions discussed.   I personally performed the services described in this documentation, which was scribed in my presence. The recorded information has been reviewed and is  accurate.     Quintella Reichert, MD 08/22/15 1901

## 2015-08-22 NOTE — Discharge Instructions (Signed)
Fatigue Fatigue is feeling tired all of the time, a lack of energy, or a lack of motivation. Occasional or mild fatigue is often a normal response to activity or life in general. However, long-lasting (chronic) or extreme fatigue may indicate an underlying medical condition. HOME CARE INSTRUCTIONS  Watch your fatigue for any changes. The following actions may help to lessen any discomfort you are feeling:  Talk to your health care provider about how much sleep you need each night. Try to get the required amount every night.  Take medicines only as directed by your health care provider.  Eat a healthy and nutritious diet. Ask your health care provider if you need help changing your diet.  Drink enough fluid to keep your urine clear or pale yellow.  Practice ways of relaxing, such as yoga, meditation, massage therapy, or acupuncture.  Exercise regularly.   Change situations that cause you stress. Try to keep your work and personal routine reasonable.  Do not abuse illegal drugs.  Limit alcohol intake to no more than 1 drink per day for nonpregnant women and 2 drinks per day for men. One drink equals 12 ounces of beer, 5 ounces of wine, or 1 ounces of hard liquor.  Take a multivitamin, if directed by your health care provider. SEEK MEDICAL CARE IF:   Your fatigue does not get better.  You have a fever.   You have unintentional weight loss or gain.  You have headaches.   You have difficulty:   Falling asleep.  Sleeping throughout the night.  You feel angry, guilty, anxious, or sad.   You are unable to have a bowel movement (constipation).   You skin is dry.   Your legs or another part of your body is swollen.  SEEK IMMEDIATE MEDICAL CARE IF:   You feel confused.   Your vision is blurry.  You feel faint or pass out.   You have a severe headache.   You have severe abdominal, pelvic, or back pain.   You have chest pain, shortness of breath, or an  irregular or fast heartbeat.   You are unable to urinate or you urinate less than normal.   You develop abnormal bleeding, such as bleeding from the rectum, vagina, nose, lungs, or nipples.  You vomit blood.   You have thoughts about harming yourself or committing suicide.   You are worried that you might harm someone else.    This information is not intended to replace advice given to you by your health care provider. Make sure you discuss any questions you have with your health care provider.   Document Released: 12/26/2006 Document Revised: 03/21/2014 Document Reviewed: 07/02/2013 Elsevier Interactive Patient Education 2016 Elsevier Inc.  Hypotension As your heart beats, it forces blood through your arteries. This force is your blood pressure. If your blood pressure is too low for you to go about your normal activities or to support the organs of your body, you have hypotension. Hypotension is also referred to as low blood pressure. When your blood pressure becomes too low, you may not get enough blood to your brain. As a result, you may feel weak, feel lightheaded, or develop a rapid heart rate. In a more severe case, you may faint. CAUSES Various conditions can cause hypotension. These include:  Blood loss.  Dehydration.  Heart or endocrine problems.  Pregnancy.  Severe infection.  Not having a well-balanced diet filled with needed nutrients.  Severe allergic reactions (anaphylaxis). Some medicines, such as blood  pressure medicine or water pills (diuretics), may lower your blood pressure below normal. Sometimes taking too much medicine or taking medicine not as directed can cause hypotension. TREATMENT  Hospitalization is sometimes required for hypotension if fluid or blood replacement is needed, if time is needed for medicines to wear off, or if further monitoring is needed. Treatment might include changing your diet, changing your medicines (including medicines  aimed at raising your blood pressure), and use of support stockings. HOME CARE INSTRUCTIONS   Drink enough fluids to keep your urine clear or pale yellow.  Take your medicines as directed by your health care provider.  Get up slowly from reclining or sitting positions. This gives your blood pressure a chance to adjust.  Wear support stockings as directed by your health care provider.  Maintain a healthy diet by including nutritious food, such as fruits, vegetables, nuts, whole grains, and lean meats. SEEK MEDICAL CARE IF:  You have vomiting or diarrhea.  You have a fever for more than 2-3 days.  You feel more thirsty than usual.  You feel weak and tired. SEEK IMMEDIATE MEDICAL CARE IF:   You have chest pain or a fast or irregular heartbeat.  You have a loss of feeling in some part of your body, or you lose movement in your arms or legs.  You have trouble speaking.  You become sweaty or feel lightheaded.  You faint. MAKE SURE YOU:   Understand these instructions.  Will watch your condition.  Will get help right away if you are not doing well or get worse.   This information is not intended to replace advice given to you by your health care provider. Make sure you discuss any questions you have with your health care provider.   Document Released: 02/28/2005 Document Revised: 12/19/2012 Document Reviewed: 08/31/2012 Elsevier Interactive Patient Education [employer].

## 2015-08-22 NOTE — ED Notes (Signed)
Pt reports having gastritis-losing weight.  Reports that several weeks ago, she had an episode of fainting.  States that 2 days ago she had another episode.  Denies other symptoms at this time.  Pt ambulatory.

## 2015-08-24 ENCOUNTER — Telehealth: Payer: Self-pay | Admitting: Internal Medicine

## 2015-08-24 NOTE — Telephone Encounter (Signed)
Please advise 

## 2015-08-24 NOTE — Telephone Encounter (Signed)
Please schedule a office visit for this week. ASAP if she is not feeling better

## 2015-08-24 NOTE — Telephone Encounter (Signed)
Spoke w/ Pt, she is feeling better, has been drinking plenty of fluids as recommended by ED. Pt has scheduled ED f/u for 08/25/2015 at 1300. Instructed Pt to call if she begins feeling worse. Pt verbalized understanding.

## 2015-08-24 NOTE — Telephone Encounter (Signed)
°  Relationship to patient: Self Can be reached: [phone]     Reason for call: Seen in the ER on Saturday. Dx with Orthostatic Hypertension. Wants to know if she needs to be seen by provider before her scheduled appt on 6/27. Plse adv

## 2015-08-25 ENCOUNTER — Ambulatory Visit (INDEPENDENT_AMBULATORY_CARE_PROVIDER_SITE_OTHER): Payer: PPO | Admitting: Internal Medicine

## 2015-08-25 ENCOUNTER — Encounter: Payer: Self-pay | Admitting: Internal Medicine

## 2015-08-25 VITALS — BP 122/76 | HR 65 | Temp 97.6°F | Ht 60.0 in | Wt 130.0 lb

## 2015-08-25 DIAGNOSIS — I951 Orthostatic hypotension: Secondary | ICD-10-CM | POA: Diagnosis not present

## 2015-08-25 DIAGNOSIS — R5383 Other fatigue: Secondary | ICD-10-CM

## 2015-08-25 DIAGNOSIS — R55 Syncope and collapse: Secondary | ICD-10-CM | POA: Diagnosis not present

## 2015-08-25 DIAGNOSIS — R42 Dizziness and giddiness: Secondary | ICD-10-CM

## 2015-08-25 LAB — CBC WITH DIFFERENTIAL/PLATELET
BASOS ABS: 0 10*3/uL (ref 0.0–0.1)
Basophils Relative: 0.3 % (ref 0.0–3.0)
EOS ABS: 0 10*3/uL (ref 0.0–0.7)
Eosinophils Relative: 1 % (ref 0.0–5.0)
HCT: 39.9 % (ref 36.0–46.0)
Hemoglobin: 13.5 g/dL (ref 12.0–15.0)
LYMPHS ABS: 1.4 10*3/uL (ref 0.7–4.0)
Lymphocytes Relative: 29.4 % (ref 12.0–46.0)
MCHC: 33.7 g/dL (ref 30.0–36.0)
MCV: 91.9 fl (ref 78.0–100.0)
Monocytes Absolute: 0.4 10*3/uL (ref 0.1–1.0)
Monocytes Relative: 8.3 % (ref 3.0–12.0)
NEUTROS ABS: 2.9 10*3/uL (ref 1.4–7.7)
NEUTROS PCT: 61 % (ref 43.0–77.0)
PLATELETS: 142 10*3/uL — AB (ref 150.0–400.0)
RBC: 4.34 Mil/uL (ref 3.87–5.11)
RDW: 12.4 % (ref 11.5–15.5)
WBC: 4.8 10*3/uL (ref 4.0–10.5)

## 2015-08-25 LAB — BASIC METABOLIC PANEL
BUN: 16 mg/dL (ref 6–23)
CALCIUM: 10.7 mg/dL — AB (ref 8.4–10.5)
CO2: 26 meq/L (ref 19–32)
CREATININE: 0.97 mg/dL (ref 0.40–1.20)
Chloride: 104 mEq/L (ref 96–112)
GFR: 59.47 mL/min — ABNORMAL LOW (ref >60.00)
GLUCOSE: 92 mg/dL (ref 70–99)
Potassium: 4.2 mEq/L (ref 3.5–5.1)
Sodium: 140 mEq/L (ref 135–145)

## 2015-08-25 LAB — SEDIMENTATION RATE: Sed Rate: 2 mm/hr (ref 0–30)

## 2015-08-25 MED ORDER — MEGESTROL ACETATE 400 MG/10ML PO SUSP: 400.0000 mg | Freq: Two times a day (BID) | ORAL | Status: DC

## 2015-08-25 NOTE — Progress Notes (Signed)
Pre visit review using our clinic review tool, if applicable. No additional management support is needed unless otherwise documented below in the visit note. 

## 2015-08-25 NOTE — Patient Instructions (Signed)
GO TO THE LAB : Get the blood work     GO TO THE FRONT DESK Schedule your next appointment for a  checkup in 2 weeks  Start taking a medication called megestrol to  improve your appetite  Drink plenty of fluids  If you get worse, call any time If you  pass out again: go to the ER   Check the  blood pressure  daily Be sure your blood pressure is between 110/65 and  145/85. If it is consistently higher or lower, let me know

## 2015-08-25 NOTE — Progress Notes (Signed)
Subjective:    Patient ID: Rebecca [name], female    DOB: [birth year], 75 y.o.   MRN: [id number]  DOS:  08/25/2015 Type of visit - description : ED follow-up  Interval history: The patient reports the following: She was feeling relatively well up until 3 weeks ago when she fainted. At that time, she got up from her bed, took a few steps, felt really unbalance, couldn't see anything, and loss consciousness for a few seconds (?). Apparently he did not fall in the floor but she was able to get back to bed before she passed out. There  was no nausea, vomiting, diaphoresis, chest pain, shortness of breath, headache, slurred speech or motor deficits. Since then is feeling weak, very fatigue, off balance. Describes the off balance feeling as feeling dizzy when she moves or stand up, she needs to quickly sit down and rest, gets shaky. No actual spinning at rest.  Went to the ED 08/22/2015 with above concerns. She was orthostatic at the ER.. toponin was negative, CBC normal, potassium 3.2, EKG nonacute. She got IV fluids  And felt better for few hours. Checked  her BPs prior to the ER visit and they were definitely low in the 90s / 50s, after the ER visit she has seen SBP in the 90s but also in the 120s.   Wt Readings from Last 3 Encounters:  08/25/15 130 lb (58.968 kg)  08/22/15 129 lb 9 oz (58.769 kg)  06/25/15 141 lb (63.957 kg)     Review of Systems Reports her anxiety and depression well-controlled with medications, she however has a very poor appetite and she has to "force herself to eat". Denies fever, chills, no unusual pains except for neck pain without radiation. No headache per se. No blood in the stools or stomach pain. Denies difficulty breathing, leg swelling or calf pain. Not taking any pain medication. Only new meds is gaviscon prn  Past Medical History  Diagnosis Date  . Hyperlipidemia   . Osteoporosis   . Chronic neck pain   . Hypertension     No longer takes med.   Marland Kitchen Dysphagia     esophageal stretched 9-14   . Arthritis     degenerative lumbar spine   . History of blood transfusion 2009    post - lumbar fusion   . Osteoarthritis of left shoulder region     Mild, Dr. Mardelle Matte  . Complication of anesthesia     at one time my blood pressure dropped -lumbar - on bp med at that time  . Depression   . Anxiety     Past Surgical History  Procedure Laterality Date  . Cholecystectomy    . Abdominal hysterectomy    . Tonsillectomy    . Oophorectomy Bilateral   . Shoulder surgery Bilateral     x 2/Dr. Collier Salina  . Back surgery  2009    Dr Saintclair Halsted  . Neck surgery  05/2008    Dr Saintclair Halsted  . Knee surgery  9-11    right,  scope; left knee  . Arm surgery Bilateral     transposed ulner nerve 10 (dr. Amedeo Plenty)  . Carpal tunnell  2006    bilateral  . Rhinoplasty  1980  . Partial knee arthroplasty  01/17/2012    LEFT KNEE  . Partial knee arthroplasty  01/17/2012    Procedure: UNICOMPARTMENTAL KNEE;  Surgeon: Johnny Bridge, MD;  Location: [facility];  Service: Orthopedics;  Laterality: Left;  . Back surgery  [id number]    Dr Saintclair Halsted  . Anterior cervical decomp/discectomy fusion N/A 11/20/2014    Procedure: ANTERIOR CERVICAL DECOMPRESSION/DISCECTOMY FUSION 2 LEVELS;  Surgeon: Phylliss Bob, MD;  Location: [facility];  Service: Orthopedics;  Laterality: N/A;  Anterior cervical decompression fusion, cervical 3-4, cervical 4-5 with instrumentation and allograft  . Colonoscopy      Social History   Social History  . Marital Status: Married    Spouse Name: [name].  . Number of Children: 4  . Years of Education: N/A   Occupational History  . stay home     Social History Main Topics  . Smoking status: Never Smoker   . Smokeless tobacco: Never Used  . Alcohol Use: 0.0 oz/week    0 Glasses of wine per week     Comment: rare  . Drug Use: No  . Sexual Activity: Not Currently   Other Topics Concern  . Not on file   Social History Narrative   Husband has cancer    1  child lives in this area         Medication List       This list is accurate as of: 08/25/15 11:59 PM.  Always use your most recent med list.               calcium-vitamin D 250-125 MG-UNIT tablet  Commonly known as:  OSCAL WITH D  Take 1 tablet by mouth 2 (two) times daily.     clonazePAM 0.5 MG tablet  Commonly known as:  KLONOPIN  Take 1 tablet (0.5 mg total) by mouth 3 (three) times daily as needed for anxiety.     D3-1000 PO  Take 1,000 Units by mouth daily.     escitalopram 20 MG tablet  Commonly known as:  LEXAPRO  Take 1 tablet (20 mg total) by mouth daily.     fish oil-omega-3 fatty acids 1000 MG capsule  Take 1 g by mouth 3 (three) times daily.     GAVISCON PO  Take by mouth. OTC     megestrol 400 MG/10ML suspension  Commonly known as:  MEGACE  Take 10 mLs (400 mg total) by mouth 2 (two) times daily.     multivitamin tablet  Take 1 tablet by mouth daily. Reported on 08/25/2015     multivitamin-lutein Caps capsule  Take 1 capsule by mouth daily. Reported on 08/25/2015     ondansetron 8 MG tablet  Commonly known as:  ZOFRAN  Take 1 tablet (8 mg total) by mouth every 8 (eight) hours as needed for nausea or vomiting.     pantoprazole 40 MG tablet  Commonly known as:  PROTONIX  Take 1 tablet (40 mg total) by mouth daily.     simvastatin 40 MG tablet  Commonly known as:  ZOCOR  Take 1 tablet (40 mg total) by mouth every evening.           Objective:   Physical Exam BP 122/76 mmHg  Pulse 65  Temp(Src) 97.6 F (36.4 C) (Oral)  Ht 5' (1.524 m)  Wt 130 lb (58.968 kg)  BMI 25.39 kg/m2  SpO2 95% General:   Well developed, no distress, looks fatigue/tiredness but not acutely ill..  Neck: No thyromegaly, normal carotid pulses without bruit HEENT:  Normocephalic . Face symmetric, atraumatic Lungs:  CTA B Normal respiratory effort, no intercostal retractions, no accessory muscle use. Heart: RRR,  no murmur.  no pretibial edema bilaterally    Abdomen:  Not distended, soft,  non-tender. No rebound or rigidity. Palpable, nontender aorta Skin: Not pale. Not jaundice Neurologic:  alert & oriented X3.  Speech normal, gait appropriate for age and unassisted EOMI, DTRs symmetric. Psych--  Cognition and judgment appear intact.  Cooperative with normal attention span and concentration.  Behavior appropriate. No anxious or depressed appearing.    Assessment & Plan:   Assessment> HTN -- on no med since ~ 2009 Hyperlipidemia Anxiety depression: onset Fall 2016 d/t husband health, lost sister 03-2015. rx lexapro 02-2015 GERD Esophageal stricture stretched 2014  MSK: --DJD --Chronic neck pain  --spinal stenosis --multiple surgeries Osteoporosis DEXA osteoprosis 2006 (per pt) DEXA 11-08 normal  DEXA 9-12 mild osteopenia, rec exercise, ca and vit D Ao [place] 03-2015 - no AAA  Plan: Fatigue, orthostatic hypotension symptomatic, syncope: For the last 3 weeks she is not feeling well, very fatigued, + orthostatic. No CAD, PE on clinical grounds. BP today dropped from 120/78 pulse 54 laying down to 108/74 pulse 75 standing. Most recent TSH normal. Despite the fact that reports anxiety and depression are well-controlled , she continue with very poor appetite Plan: BMP due to recent hypokalemia, CBC to be sure she is not dropping her hemoglobin, sed rate, brain MRI  (recent stroke?) D/C gaviscon otherwise  same medications and add megestrol; push fluids. If not better consider cards referral or further evaluation.  RTC 2 weeks

## 2015-08-26 ENCOUNTER — Other Ambulatory Visit: Payer: Self-pay | Admitting: Internal Medicine

## 2015-08-26 ENCOUNTER — Telehealth: Payer: Self-pay | Admitting: *Deleted

## 2015-08-26 NOTE — Assessment & Plan Note (Signed)
Fatigue, orthostatic hypotension symptomatic, syncope: For the last 3 weeks she is not feeling well, very fatigued, + orthostatic. No CAD, PE on clinical grounds. BP today dropped from 120/78 pulse 54 laying down to 108/74 pulse 75 standing. Most recent TSH normal. Despite the fact that reports anxiety and depression are well-controlled , she continue with very poor appetite Plan: BMP due to recent hypokalemia, CBC to be sure she is not dropping her hemoglobin, sed rate, brain MRI  (recent stroke?) D/C gaviscon otherwise  same medications and add megestrol; push fluids. If not better consider cards referral or further evaluation.  RTC 2 weeks

## 2015-08-26 NOTE — Telephone Encounter (Signed)
Rx denied.  Prescription was filled on (08/25/15).//AB/CMA

## 2015-08-26 NOTE — Telephone Encounter (Signed)
PA initiated on [email or web address]. Awaiting determination. JG//CMA 

## 2015-08-28 ENCOUNTER — Telehealth: Payer: Self-pay | Admitting: Internal Medicine

## 2015-08-28 DIAGNOSIS — I951 Orthostatic hypotension: Secondary | ICD-10-CM

## 2015-08-28 NOTE — Telephone Encounter (Signed)
[facility] - Client TELEPHONE ADVICE RECORD   [facility]     Patient Name: Rebecca [name] Initial Comment Caller states c/o low blood pressure 98/53 and fatigue. She was instructed by Dr. Larose Kells to call if blood pressure was lower than 110/65.  DOB: March 20, 1940      Nurse Assessment  Nurse: Luther Parody, RN, Malachy Mood Date/Time (Eastern Time): 08/28/2015 1:30:03 PM  Confirm and document reason for call. If symptomatic, describe symptoms. You must click the next button to save text entered. ---Caller states that her b/p today is 98/53 and she feels fatigued. States that she has been having dizziness for the last month and she was seen at the ed last weekend where she was dx with orthostatic hypotension. She followed up with Dr Larose Kells earlier in the wk and was instructed to call for a b/p < 110/65. She has been checking it daily since then and it dropped below that level yesterday and today. Symptoms are essential unchanged with dizziness occurring mostly with positional change. Baseline b/p prior to a wk ago was usually > [id number] systolic.  Has the patient traveled out of the country within the last 30 days? ---Not Applicable  Does the patient have any new or worsening symptoms? ---Yes  Will a triage be completed? ---Yes  Related visit to physician within the last 2 weeks? ---Yes  Does the PT have any chronic conditions? (i.e. diabetes, asthma, etc.) ---Yes  List chronic conditions. ---orthostatic hypotension, high cholesterol  Is this a behavioral health or substance abuse call? ---No    Guidelines     Guideline Title Affirmed Question Affirmed Notes   Low Blood Pressure [id number] Fall in systolic BP > 20 mm Hg from normal AND [2] dizzy, lightheaded, or weak    Final Disposition User   Go to ED Now (or PCP triage) Luther Parody, RN, Malachy Mood   Comments   Spoke with [place] in the office and explained that the pts s/s are about the same but she was instructed to callback for b/p below  110/65 however per our guidelines that is an ed outcome and I cannot downgrade her. Deniese attempted to reach the nurse for Dr Larose Kells but she is busy and she will ask her to call the pt. Advised the pt if she has not heard back from the office with in one hr or she gets worse to proceed to the ed. States that she will see how she feels and may just wait and see.   Referrals   GO TO FACILITY UNDECIDED   Disagree/Comply: Comply

## 2015-08-28 NOTE — Telephone Encounter (Signed)
Called and made patient aware.  She stated understanding and agrees with plan.  Cardiology referral placed.  Pt states she is drinking plenty of fluids, but is still not eating well.  States she has not been able to start megestrol, because a prior authorization is needed.  Will call pharmacy on Monday to follow up.

## 2015-08-28 NOTE — Telephone Encounter (Signed)
The patient is not taking medications that could drop her BP.  Arrange cardiology visit for next week if possible, DX orthostatic hypotension During the weekend if she feels much worse go to the ER.

## 2015-08-28 NOTE — Telephone Encounter (Signed)
Pt seen by Dr. Larose Kells on 08/25/15.  Pt was given the following instructions:    Patient Instructions     GO TO THE LAB : Get the blood work    GO TO THE FRONT DESK Schedule your next appointment for a checkup in 2 weeks  Start taking a medication called megestrol to improve your appetite  Drink plenty of fluids  If you get worse, call any time If you pass out again: go to the ER   Check the blood pressure daily Be sure your blood pressure is between 110/65 and 145/85. If it is consistently higher or lower, let me know   Per note below, pt was calling office to make Dr. Larose Kells aware of the low readings and she says "symptoms are essential unchanged with dizziness occurring mostly with positional change."   Please advised.

## 2015-08-28 NOTE — Telephone Encounter (Signed)
Pt called in reporting low BPs. Transferred to Amy with [facility].  Wednesday 114/57 & 127/59 Thursday 103/51 & 105/64 Today 76/51 & 98/53

## 2015-08-31 NOTE — Telephone Encounter (Signed)
Called pharmacy.  They confirmed that a prior authorization is needed.  Asked them to fax PA over.  Tech agreed that she would. Awaiting fax.

## 2015-09-01 NOTE — Telephone Encounter (Signed)
Insurance company called to follow up on the PA for patient. Request that form be filled out and returned as soon as possible so that a determination can be made. States that the phone and fax number are on the form that was faxed yesterday.

## 2015-09-02 NOTE — Telephone Encounter (Signed)
PA approved effecitve 09/02/15 through 03/13/16 by Blase Mess. Approval letter sent for scanning. JG//CMA

## 2015-09-02 NOTE — Telephone Encounter (Signed)
Form completed for the 2nd time and faxed back to [place], fax confirmation received. JG//CMA

## 2015-09-05 ENCOUNTER — Ambulatory Visit (HOSPITAL_BASED_OUTPATIENT_CLINIC_OR_DEPARTMENT_OTHER)
Admission: RE | Admit: 2015-09-05 | Discharge: 2015-09-05 | Disposition: A | Discharge status: Home / Self Care | Payer: PPO | Source: Ambulatory Visit | Attending: Internal Medicine | Admitting: Internal Medicine

## 2015-09-05 DIAGNOSIS — R42 Dizziness and giddiness: Secondary | ICD-10-CM | POA: Insufficient documentation

## 2015-09-07 NOTE — Telephone Encounter (Signed)
Fax received. PA completed by Murtis Sink, CMA.    Chaya Jan, CMA at 09/02/2015 5:33 PM     Status: Signed       Expand All Collapse All   PA approved effecitve 09/02/15 through 03/13/16 by Blase Mess. Approval letter sent for scanning. JG//CMA

## 2015-09-08 ENCOUNTER — Encounter: Payer: Self-pay | Admitting: Internal Medicine

## 2015-09-08 ENCOUNTER — Ambulatory Visit (INDEPENDENT_AMBULATORY_CARE_PROVIDER_SITE_OTHER): Payer: PPO | Admitting: Internal Medicine

## 2015-09-08 VITALS — BP 120/86 | HR 59 | Temp 97.6°F | Resp 16 | Ht 60.0 in | Wt 128.5 lb

## 2015-09-08 DIAGNOSIS — R5383 Other fatigue: Secondary | ICD-10-CM | POA: Diagnosis not present

## 2015-09-08 DIAGNOSIS — R634 Abnormal weight loss: Secondary | ICD-10-CM

## 2015-09-08 DIAGNOSIS — R42 Dizziness and giddiness: Secondary | ICD-10-CM | POA: Diagnosis not present

## 2015-09-08 DIAGNOSIS — I951 Orthostatic hypotension: Secondary | ICD-10-CM | POA: Diagnosis not present

## 2015-09-08 NOTE — Patient Instructions (Signed)
Continue same medications, next visit in one month.

## 2015-09-08 NOTE — Progress Notes (Signed)
Subjective:    Patient ID: Rebecca [name], female    DOB: 1940-08-16, 75 y.o.   MRN: [id number]  DOS:  09/08/2015 Type of visit - description : Follow-up Interval history: Since the last office visit, is feeling about the same. Continue with fatigue Cont  with poor appetite although started megestrol a few days ago and over the last 2 days has eaten little more Continue with dizziness when she stands up quickly or turning her head Again reports that emotionally she is doing okay. Ambulatory BPs are ranging from 93/62 to normal ~127/59.  Wt Readings from Last 3 Encounters:  09/08/15 128 lb 8 oz (58.287 kg)  08/25/15 130 lb (58.968 kg)  08/22/15 129 lb 9 oz (58.769 kg)    Review of Systems  Denies nausea, abdominal pain No headaches, no unusual myalgias.  Past Medical History  Diagnosis Date  . Hyperlipidemia   . Osteoporosis   . Chronic neck pain   . Hypertension     No longer takes med.  Marland Kitchen Dysphagia     esophageal stretched 9-14   . Arthritis     degenerative lumbar spine   . History of blood transfusion 2009    post - lumbar fusion   . Osteoarthritis of left shoulder region     Mild, Dr. Mardelle Matte  . Complication of anesthesia     at one time my blood pressure dropped -lumbar - on bp med at that time  . Depression   . Anxiety     Past Surgical History  Procedure Laterality Date  . Cholecystectomy    . Abdominal hysterectomy    . Tonsillectomy    . Oophorectomy Bilateral   . Shoulder surgery Bilateral     x 2/Dr. Collier Salina  . Back surgery  2009    Dr Saintclair Halsted  . Neck surgery  05/2008    Dr Saintclair Halsted  . Knee surgery  9-11    right,  scope; left knee  . Arm surgery Bilateral     transposed ulner nerve 10 (dr. Amedeo Plenty)  . Carpal tunnell  2006    bilateral  . Rhinoplasty  1980  . Partial knee arthroplasty  01/17/2012    LEFT KNEE  . Partial knee arthroplasty  01/17/2012    Procedure: UNICOMPARTMENTAL KNEE;  Surgeon: Johnny Bridge, MD;  Location: [facility];   Service: Orthopedics;  Laterality: Left;  . Back surgery  01-2014    Dr Saintclair Halsted  . Anterior cervical decomp/discectomy fusion N/A 11/20/2014    Procedure: ANTERIOR CERVICAL DECOMPRESSION/DISCECTOMY FUSION 2 LEVELS;  Surgeon: Phylliss Bob, MD;  Location: [facility];  Service: Orthopedics;  Laterality: N/A;  Anterior cervical decompression fusion, cervical 3-4, cervical 4-5 with instrumentation and allograft  . Colonoscopy      Social History   Social History  . Marital Status: Married    Spouse Name: [name].  . Number of Children: 4  . Years of Education: N/A   Occupational History  . stay home     Social History Main Topics  . Smoking status: Never Smoker   . Smokeless tobacco: Never Used  . Alcohol Use: 0.0 oz/week    0 Glasses of wine per week     Comment: rare  . Drug Use: No  . Sexual Activity: Not Currently   Other Topics Concern  . Not on file   Social History Narrative   Husband has cancer    1 child lives in this area  Medication List       This list is accurate as of: 09/08/15 11:59 PM.  Always use your most recent med list.               calcium-vitamin D 250-125 MG-UNIT tablet  Commonly known as:  OSCAL WITH D  Take 1 tablet by mouth 2 (two) times daily.     clonazePAM 0.5 MG tablet  Commonly known as:  KLONOPIN  Take 1 tablet (0.5 mg total) by mouth 3 (three) times daily as needed for anxiety.     D3-1000 PO  Take 1,000 Units by mouth daily.     escitalopram 20 MG tablet  Commonly known as:  LEXAPRO  Take 1 tablet (20 mg total) by mouth daily.     fish oil-omega-3 fatty acids 1000 MG capsule  Take 1 g by mouth 3 (three) times daily.     GAVISCON PO  Take by mouth. OTC     megestrol 400 MG/10ML suspension  Commonly known as:  MEGACE  Take 10 mLs (400 mg total) by mouth 2 (two) times daily.     multivitamin tablet  Take 1 tablet by mouth daily. Reported on 08/25/2015     simvastatin 40 MG tablet  Commonly known as:  ZOCOR  Take 1  tablet (40 mg total) by mouth every evening.           Objective:   Physical Exam BP 120/86 mmHg  Pulse 59  Temp(Src) 97.6 F (36.4 C) (Oral)  Resp 16  Ht 5' (1.524 m)  Wt 128 lb 8 oz (58.287 kg)  BMI 25.10 kg/m2  SpO2 98% General:   Well developed, well nourished . NAD. Orthostatic vital signs today negative HEENT:  Normocephalic . Face symmetric, atraumatic Lungs:  CTA B Normal respiratory effort, no intercostal retractions, no accessory muscle use. Heart: RRR,  no murmur.  No pretibial edema bilaterally  Skin: Not pale. Not jaundice Neurologic:  alert & oriented X3.  Speech normal, gait appropriate for age and unassisted Psych--  Cognition and judgment appear intact.  Cooperative with normal attention span and concentration.  Behavior appropriate. No anxious or depressed appearing.      Assessment & Plan:  Assessment> HTN -- on no med since ~ 2009 Hyperlipidemia Anxiety depression: onset Fall 2016 d/t husband health, lost sister 03-2015. rx lexapro 02-2015 GERD Esophageal stricture stretched 2014  EGD/2017--->  gastritis, bx---reactive gastropathy, no H. pylori MSK: --DJD --Chronic neck pain  --spinal stenosis --multiple surgeries Osteoporosis DEXA osteoprosis 2006 (per pt) DEXA 11-08 normal  DEXA 9-12 mild osteopenia, rec exercise, ca and vit D Ao [place] 03-2015 - no AAA  PLAN: Fatigue, orthostatic hypotension, dizziness, poor appetite, weight loss: Symptoms started early May, workup so far negative,  MRI of the brain showed no recent stroke. Today she is not orthostatic by vital signs. --Fatigue : Unclear etiology, labs unremarkable --Orthostatic : Seems resolved, will se cards  --Episodic low BP: We'll see cardiology soon, not on BP medications. --Dizziness: Ongoing, related to standing, if not improving soon, neurology or ENT referral. She felt it could be related to clonazepam, we agreed she will minimize or stop it use. --Lack of appetite: just  started megestrol No change --weight loss: Weight has stabilized  RTC one month.

## 2015-09-09 NOTE — Assessment & Plan Note (Signed)
Fatigue, orthostatic hypotension, dizziness, poor appetite, weight loss: Symptoms started early May, workup so far negative,  MRI of the brain showed no recent stroke. Today she is not orthostatic by vital signs. --Fatigue : Unclear etiology, labs unremarkable --Orthostatic : Seems resolved, will se cards  --Episodic low BP: We'll see cardiology soon, not on BP medications. --Dizziness: Ongoing, related to standing, if not improving soon, neurology or ENT referral. She felt it could be related to clonazepam, we agreed she will minimize or stop it use. --Lack of appetite: just started megestrol No change --weight loss: Weight has stabilized  RTC one month.

## 2015-09-16 ENCOUNTER — Other Ambulatory Visit: Payer: Self-pay | Admitting: Internal Medicine

## 2015-09-21 ENCOUNTER — Encounter: Payer: Self-pay | Admitting: Cardiology

## 2015-09-21 ENCOUNTER — Ambulatory Visit (INDEPENDENT_AMBULATORY_CARE_PROVIDER_SITE_OTHER): Payer: PPO | Admitting: Cardiology

## 2015-09-21 VITALS — BP 136/74 | HR 60 | Ht 60.0 in | Wt 132.0 lb

## 2015-09-21 DIAGNOSIS — I951 Orthostatic hypotension: Secondary | ICD-10-CM

## 2015-09-21 NOTE — Patient Instructions (Addendum)
Medication Instructions:  Your physician recommends that you continue on your current medications as directed. Please refer to the Current Medication list given to you today.  Labwork: None ordered  Testing/Procedures: None ordered  Follow-Up: Your physician wants you to follow-up in: 3 months with Dr. Curt Bears. You will receive a reminder letter in the mail two months in advance. If you don't receive a letter, please call our office to schedule the follow-up appointment.   If you need a refill on your cardiac medications before your next appointment, please call your pharmacy.  Thank you for choosing [facility]!!   Trinidad Curet, RN [phone]

## 2015-09-21 NOTE — Progress Notes (Signed)
Cardiology Office Note   Date:  09/21/2015   ID:  Rebecca [name], DOB 06-06-1940, MRN [id number]  PCP:  Kathlene November, MD  Cardiologist:  Will Meredith Leeds, MD    Chief Complaint  Patient presents with  . New Patient (Initial Visit)     History of Present Illness: Rebecca [name] is a 75 y.o. female who presents today for electrophysiology evaluation.   Presenting today for workup of orthostatic hypotension.  She says that she has been feeling well for the last few days.  She has had issues with severe anxiety and depression over the last few months.  She says that the depression is due to her husband's recurrence of multiple myeloma.  She states that she has lost "half a person" in weight and that she has no appetite.  She has been supplementing her diet.  She says that she had been having issues with orthostais for the last month.  Her BP had been dropping with standing.  She has been drinking increased fluids over the last few days and has not had issues with orthostatics during that time. She has had no chest pain, palpitations, SOB during that time.   Today, she denies symptoms of palpitations, chest pain, shortness of breath, orthopnea, PND, lower extremity edema, claudication, dizziness, presyncope, syncope, bleeding, or neurologic sequela. The patient is tolerating medications without difficulties and is otherwise without complaint today.    Past Medical History  Diagnosis Date  . Hyperlipidemia   . Osteoporosis   . Chronic neck pain   . Hypertension     No longer takes med.  Marland Kitchen Dysphagia     esophageal stretched 9-14   . Arthritis     degenerative lumbar spine   . History of blood transfusion 2009    post - lumbar fusion   . Osteoarthritis of left shoulder region     Mild, Dr. Mardelle Matte  . Complication of anesthesia     at one time my blood pressure dropped -lumbar - on bp med at that time  . Depression   . Anxiety    Past Surgical History  Procedure Laterality  Date  . Cholecystectomy    . Abdominal hysterectomy    . Tonsillectomy    . Oophorectomy Bilateral   . Shoulder surgery Bilateral     x 2/Dr. Collier Salina  . Back surgery  2009    Dr Saintclair Halsted  . Neck surgery  05/2008    Dr Saintclair Halsted  . Knee surgery  9-11    right,  scope; left knee  . Arm surgery Bilateral     transposed ulner nerve 10 (dr. Amedeo Plenty)  . Carpal tunnell  2006    bilateral  . Rhinoplasty  1980  . Partial knee arthroplasty  01/17/2012    LEFT KNEE  . Partial knee arthroplasty  01/17/2012    Procedure: UNICOMPARTMENTAL KNEE;  Surgeon: Johnny Bridge, MD;  Location: [place];  Service: Orthopedics;  Laterality: Left;  . Back surgery  01-2014    Dr Saintclair Halsted  . Anterior cervical decomp/discectomy fusion N/A 11/20/2014    Procedure: ANTERIOR CERVICAL DECOMPRESSION/DISCECTOMY FUSION 2 LEVELS;  Surgeon: Phylliss Bob, MD;  Location: [facility];  Service: Orthopedics;  Laterality: N/A;  Anterior cervical decompression fusion, cervical 3-4, cervical 4-5 with instrumentation and allograft  . Colonoscopy       Current Outpatient Prescriptions  Medication Sig Dispense Refill  . Alum Hydroxide-Mag Carbonate (GAVISCON PO) Take by mouth. OTC    . calcium-vitamin  D (OSCAL WITH D) 250-125 MG-UNIT per tablet Take 1 tablet by mouth 2 (two) times daily.    . Cholecalciferol (D3-1000 PO) Take 1,000 Units by mouth daily.      . clonazePAM (KLONOPIN) 0.5 MG tablet Take 1 tablet (0.5 mg total) by mouth 3 (three) times daily as needed for anxiety. 270 tablet 1  . escitalopram (LEXAPRO) 20 MG tablet Take 1 tablet (20 mg total) by mouth daily. 90 tablet 1  . fish oil-omega-3 fatty acids 1000 MG capsule Take 1 g by mouth 3 (three) times daily.     . megestrol (MEGACE) 40 MG/ML suspension Take 10 mLs (400 mg total) by mouth 2 (two) times daily. 240 mL 1  . Multiple Vitamin (MULTIVITAMIN) tablet Take 1 tablet by mouth daily. Reported on 08/25/2015    . simvastatin (ZOCOR) 40 MG tablet Take 1 tablet (40 mg total) by mouth  every evening. 90 tablet 2   No current facility-administered medications for this visit.    Allergies:   Tape; Alendronate sodium; Cortizone-10; Oxycodone-acetaminophen; and Penicillins   Social History:  The patient  reports that she has never smoked. She has never used smokeless tobacco. She reports that she drinks alcohol. She reports that she does not use illicit drugs.   Family History:  The patient's family history includes Diabetes in her sister; Heart disease in her sister; Kidney disease in her brother and sister; Prostate cancer in her father; Stomach cancer in her maternal aunt. There is no history of Colon cancer or Breast cancer.    ROS:  Please see the history of present illness.   Otherwise, review of systems is positive for Weight loss, appetite change, chills, fatigue, depression, anxiety, dizziness, syncope, easy bruising or.   All other systems are reviewed and negative.    PHYSICAL EXAM: VS:  BP 136/74 mmHg  Pulse 60  Ht 5' (1.524 m)  Wt 132 lb (59.875 kg)  BMI 25.78 kg/m2 , BMI Body mass index is 25.78 kg/(m^2). GEN: Well nourished, well developed, in no acute distress HEENT: normal Neck: no JVD, carotid bruits, or masses Cardiac: RRR; no murmurs, rubs, or gallops,no edema  Respiratory:  clear to auscultation bilaterally, normal work of breathing GI: soft, nontender, nondistended, + BS MS: no deformity or atrophy Skin: warm and dry Neuro:  Strength and sensation are intact Psych: euthymic mood, full affect  EKG:  EKG is not ordered today  Recent Labs: 05/11/2015: TSH 1.32 08/22/2015: ALT 34 08/25/2015: BUN 16; Creatinine, Ser 0.97; Hemoglobin 13.5; Platelets 142.0*; Potassium 4.2; Sodium 140    Lipid Panel     Component Value Date/Time   CHOL 133 03/30/2015 0852   TRIG 97.0 03/30/2015 0852   TRIG 40 02/14/2006 1005   HDL 48.80 03/30/2015 0852   CHOLHDL 3 03/30/2015 0852   CHOLHDL 2.0 CALC 02/14/2006 1005   VLDL 19.4 03/30/2015 0852   LDLCALC 65  03/30/2015 0852     Wt Readings from Last 3 Encounters:  09/21/15 132 lb (59.875 kg)  09/08/15 128 lb 8 oz (58.287 kg)  08/25/15 130 lb (58.968 kg)      Other studies Reviewed: Additional studies/ records that were reviewed today include: PCP notes   ASSESSMENT AND PLAN:  1.  Orthostatic hypotension: Has been improved with increased fluid intake.  Orthostasis likely multifactorial.  She has lost a great deal of weight which is likely contributing.  As she has been feeling better with increased fluids, will make no changes today.  Her BP  is normal today so will not start her on any medications.  Will see her back in 3 months which may allow her to equilibrate after such a dramatic weight loss.    Current medicines are reviewed at length with the patient today.   The patient does not have concerns regarding her medicines.  The following changes were made today:  none  Labs/ tests ordered today include:  No orders of the defined types were placed in this encounter.     Disposition:   FU with Will Camnitz 3 months  Signed, Will Meredith Leeds, MD  09/21/2015 4:03 PM     [place] [address] [place] [address] [phone] (office) [phone] (fax)

## 2015-09-30 ENCOUNTER — Encounter: Payer: Self-pay | Admitting: Internal Medicine

## 2015-10-07 ENCOUNTER — Encounter: Payer: Self-pay | Admitting: Internal Medicine

## 2015-10-07 ENCOUNTER — Ambulatory Visit (INDEPENDENT_AMBULATORY_CARE_PROVIDER_SITE_OTHER): Payer: PPO | Admitting: Internal Medicine

## 2015-10-07 VITALS — BP 114/70 | HR 62 | Temp 97.5°F | Ht 60.0 in | Wt 133.2 lb

## 2015-10-07 DIAGNOSIS — F329 Major depressive disorder, single episode, unspecified: Secondary | ICD-10-CM

## 2015-10-07 DIAGNOSIS — F418 Other specified anxiety disorders: Secondary | ICD-10-CM | POA: Diagnosis not present

## 2015-10-07 DIAGNOSIS — R42 Dizziness and giddiness: Secondary | ICD-10-CM | POA: Diagnosis not present

## 2015-10-07 DIAGNOSIS — F32A Depression, unspecified: Secondary | ICD-10-CM

## 2015-10-07 DIAGNOSIS — R5383 Other fatigue: Secondary | ICD-10-CM | POA: Diagnosis not present

## 2015-10-07 DIAGNOSIS — F419 Anxiety disorder, unspecified: Secondary | ICD-10-CM

## 2015-10-07 NOTE — Progress Notes (Signed)
Pre visit review using our clinic review tool, if applicable. No additional management support is needed unless otherwise documented below in the visit note. 

## 2015-10-07 NOTE — Progress Notes (Signed)
Subjective:    Patient ID: Rebecca [name], female    DOB: 1941/01/01, 75 y.o.   MRN: [id number]  DOS:  10/07/2015 Type of visit - description : rov Interval history: He had with her husband. Since the last time she was here, saw cardiology, felt to be stable. Appetite has improved, taking Megace  only once daily. Dizziness improved Lack of energy improved, she has been more active   Review of Systems Anxiety depression well-controlled currently.  Past Medical History:  Diagnosis Date  . Anxiety   . Arthritis    degenerative lumbar spine   . Chronic neck pain   . Complication of anesthesia    at one time my blood pressure dropped -lumbar - on bp med at that time  . Depression   . Dysphagia    esophageal stretched 9-14   . History of blood transfusion 2009   post - lumbar fusion   . Hyperlipidemia   . Hypertension    No longer takes med.  . Osteoarthritis of left shoulder region    Mild, Dr. Mardelle Matte  . Osteoporosis     Past Surgical History:  Procedure Laterality Date  . ABDOMINAL HYSTERECTOMY    . ANTERIOR CERVICAL DECOMP/DISCECTOMY FUSION N/A 11/20/2014   Procedure: ANTERIOR CERVICAL DECOMPRESSION/DISCECTOMY FUSION 2 LEVELS;  Surgeon: Phylliss Bob, MD;  Location: [facility];  Service: Orthopedics;  Laterality: N/A;  Anterior cervical decompression fusion, cervical 3-4, cervical 4-5 with instrumentation and allograft  . arm surgery Bilateral    transposed ulner nerve 10 (dr. Amedeo Plenty)  . BACK SURGERY  2009   Dr Jeralyn Bennett SURGERY  [id number]   Dr Saintclair Halsted  . carpal tunnell  2006   bilateral  . CHOLECYSTECTOMY    . COLONOSCOPY    . KNEE SURGERY  9-11   right,  scope; left knee  . NECK SURGERY  05/2008   Dr Saintclair Halsted  . OOPHORECTOMY Bilateral   . PARTIAL KNEE ARTHROPLASTY  01/17/2012   LEFT KNEE  . PARTIAL KNEE ARTHROPLASTY  01/17/2012   Procedure: UNICOMPARTMENTAL KNEE;  Surgeon: Johnny Bridge, MD;  Location: [facility];  Service: Orthopedics;  Laterality: Left;  .  RHINOPLASTY  1980  . SHOULDER SURGERY Bilateral    x 2/Dr. Collier Salina  . TONSILLECTOMY      Social History   Social History  . Marital status: Married    Spouse name: [name].  . Number of children: 4  . Years of education: N/A   Occupational History  . stay home     Social History Main Topics  . Smoking status: Never Smoker  . Smokeless tobacco: Never Used  . Alcohol use 0.0 oz/week     Comment: rare  . Drug use: No  . Sexual activity: Not Currently   Other Topics Concern  . Not on file   Social History Narrative   Husband has cancer    1 child lives in this area         Medication List       Accurate as of 10/07/15  1:11 PM. Always use your most recent med list.          calcium-vitamin D 250-125 MG-UNIT tablet Commonly known as:  OSCAL WITH D Take 1 tablet by mouth 2 (two) times daily.   clonazePAM 0.5 MG tablet Commonly known as:  KLONOPIN Take 1 tablet (0.5 mg total) by mouth 3 (three) times daily as needed for anxiety.   D3-1000 PO Take 1,000  Units by mouth daily.   escitalopram 20 MG tablet Commonly known as:  LEXAPRO Take 1 tablet (20 mg total) by mouth daily.   fish oil-omega-3 fatty acids 1000 MG capsule Take 1 g by mouth 3 (three) times daily.   GAVISCON PO Take by mouth. OTC   megestrol 40 MG/ML suspension Commonly known as:  MEGACE Take 10 mLs (400 mg total) by mouth 2 (two) times daily.   multivitamin tablet Take 1 tablet by mouth daily. Reported on 08/25/2015   simvastatin 40 MG tablet Commonly known as:  ZOCOR Take 1 tablet (40 mg total) by mouth every evening.          Objective:   Physical Exam BP 114/70 (BP Location: Left Arm, Patient Position: Sitting, Cuff Size: Small)   Pulse 62   Temp 97.5 F (36.4 C) (Oral)   Ht 5' (1.524 m)   Wt 133 lb 4 oz (60.4 kg)   SpO2 96%   BMI 26.02 kg/m  General:   Well developed, well nourished . NAD. Orthostatic vital signs today negative HEENT:  Normocephalic . Face  symmetric, atraumatic Lungs:  CTA B Normal respiratory effort, no intercostal retractions, no accessory muscle use. Heart: RRR,  no murmur.  No pretibial edema bilaterally  Skin: Not pale. Not jaundice Neurologic:  alert & oriented X3.  Speech normal, gait appropriate for age and unassisted Psych--  Cognition and judgment appear intact.  Cooperative with normal attention span and concentration.  Behavior appropriate. No anxious or depressed appearing.     Assessment & Plan:    Assessment> HTN -- on no med since ~ 2009 Hyperlipidemia Anxiety depression: onset Fall 2016 d/t husband health, lost sister 03-2015. rx lexapro 02-2015 GERD Esophageal stricture stretched 2014  EGD/2017--->  gastritis, bx---reactive gastropathy, no H. pylori MSK: --DJD --Chronic neck pain  --spinal stenosis --multiple surgeries Osteoporosis DEXA osteoprosis 2006 (per pt) DEXA 11-08 normal  DEXA 9-12 mild osteopenia, rec exercise, ca and vit D Ao [place] 03-2015 - no AAA  PLAN:  Fatigue, orthostatic hypotension, dizziness, poor appetite, weight loss: Fatigue: Resolving Dizziness: Resolving Lack of appetite: Improving Weight loss: Stabilize Anxiety depression: Currently well controlled In summary doing well, RTC 4 months and as needed

## 2015-10-07 NOTE — Assessment & Plan Note (Signed)
Fatigue, orthostatic hypotension, dizziness, poor appetite, weight loss: Fatigue: Resolving Dizziness: Resolving Lack of appetite: Improving Weight loss: Stabilize Anxiety depression: Currently well controlled In summary doing well, RTC 4 months and as needed

## 2015-10-07 NOTE — Patient Instructions (Signed)
GO TO THE FRONT DESK Schedule your next appointment for a  Check up in 4 months    Ok to stop megestrol if your appetite

## 2015-11-03 DIAGNOSIS — H04123 Dry eye syndrome of bilateral lacrimal glands: Secondary | ICD-10-CM | POA: Diagnosis not present

## 2015-11-03 DIAGNOSIS — H25813 Combined forms of age-related cataract, bilateral: Secondary | ICD-10-CM | POA: Diagnosis not present

## 2015-11-03 DIAGNOSIS — H31091 Other chorioretinal scars, right eye: Secondary | ICD-10-CM | POA: Diagnosis not present

## 2015-11-03 DIAGNOSIS — D3132 Benign neoplasm of left choroid: Secondary | ICD-10-CM | POA: Diagnosis not present

## 2015-12-16 ENCOUNTER — Encounter: Payer: Self-pay | Admitting: Cardiology

## 2015-12-16 ENCOUNTER — Ambulatory Visit (INDEPENDENT_AMBULATORY_CARE_PROVIDER_SITE_OTHER): Payer: PPO | Admitting: Cardiology

## 2015-12-16 VITALS — BP 166/97 | HR 81 | Ht 60.0 in | Wt 134.0 lb

## 2015-12-16 DIAGNOSIS — I951 Orthostatic hypotension: Secondary | ICD-10-CM | POA: Diagnosis not present

## 2015-12-16 NOTE — Patient Instructions (Signed)
Medication Instructions:  Your physician recommends that you continue on your current medications as directed. Please refer to the Current Medication list given to you today.  * If you need a refill on your cardiac medications before your next appointment, please call your pharmacy.   Labwork: None ordered  Testing/Procedures: None ordered  Follow-Up: No follow up is needed at this time with Dr. Camnitz.  He will see you on an as needed basis.   Thank you for choosing [facility]!!   Noah Pelaez, RN [phone]     

## 2015-12-16 NOTE — Progress Notes (Signed)
Cardiology Office Note   Date:  12/16/2015   ID:  Rebecca [name], DOB [birth year], MRN [id number]  PCP:  Kathlene November, MD  Cardiologist:  Louanne Calvillo Meredith Leeds, MD    Chief Complaint  Patient presents with  . Follow-up    3 month/orthostatic hypotension     History of Present Illness: Rebecca [name] is a 75 y.o. female who presents today for electrophysiology evaluation.   Presenting today for workup of orthostatic hypotension.  She says that she has been feeling well for the last few days.  She has had issues with severe anxiety and depression over the last few months.  She says that the depression is due to her husband's recurrence of multiple myeloma.  She states that she has lost "half a person" in weight and that she has no appetite.  She has been supplementing her diet.     Today, she denies symptoms of palpitations, chest pain, shortness of breath, orthopnea, PND, lower extremity edema, claudication, dizziness, presyncope, syncope, bleeding, or neurologic sequela. The patient is tolerating medications without difficulties and is otherwise without complaint today. She says that she is feeling much improved. Has had no further issues with dizziness.  Her mood is much improved as well.  She has not been teary over the last few weeks.  She has been much more active over the last few weeks.   Past Medical History:  Diagnosis Date  . Anxiety   . Arthritis    degenerative lumbar spine   . Chronic neck pain   . Complication of anesthesia    at one time my blood pressure dropped -lumbar - on bp med at that time  . Depression   . Dysphagia    esophageal stretched 9-14   . History of blood transfusion 2009   post - lumbar fusion   . Hyperlipidemia   . Hypertension    No longer takes med.  . Osteoarthritis of left shoulder region    Mild, Dr. Mardelle Matte  . Osteoporosis    Past Surgical History:  Procedure Laterality Date  . ABDOMINAL HYSTERECTOMY    . ANTERIOR CERVICAL  DECOMP/DISCECTOMY FUSION N/A 11/20/2014   Procedure: ANTERIOR CERVICAL DECOMPRESSION/DISCECTOMY FUSION 2 LEVELS;  Surgeon: Phylliss Bob, MD;  Location: [facility];  Service: Orthopedics;  Laterality: N/A;  Anterior cervical decompression fusion, cervical 3-4, cervical 4-5 with instrumentation and allograft  . arm surgery Bilateral    transposed ulner nerve 10 (dr. Amedeo Plenty)  . BACK SURGERY  2009   Dr Jeralyn Bennett SURGERY  [id number]   Dr Saintclair Halsted  . carpal tunnell  2006   bilateral  . CHOLECYSTECTOMY    . COLONOSCOPY    . KNEE SURGERY  9-11   right,  scope; left knee  . NECK SURGERY  05/2008   Dr Saintclair Halsted  . OOPHORECTOMY Bilateral   . PARTIAL KNEE ARTHROPLASTY  01/17/2012   LEFT KNEE  . PARTIAL KNEE ARTHROPLASTY  01/17/2012   Procedure: UNICOMPARTMENTAL KNEE;  Surgeon: Johnny Bridge, MD;  Location: [facility];  Service: Orthopedics;  Laterality: Left;  . RHINOPLASTY  1980  . SHOULDER SURGERY Bilateral    x 2/Dr. Collier Salina  . TONSILLECTOMY       Current Outpatient Prescriptions  Medication Sig Dispense Refill  . aspirin 81 MG tablet Take 81 mg by mouth daily.    . calcium-vitamin D (OSCAL WITH D) 250-125 MG-UNIT per tablet Take 1 tablet by mouth 2 (two) times daily.    Marland Kitchen  Cholecalciferol (D3-1000 PO) Take 1,000 Units by mouth daily.      . clonazePAM (KLONOPIN) 0.5 MG tablet Take 1 tablet (0.5 mg total) by mouth 3 (three) times daily as needed for anxiety. 270 tablet 1  . escitalopram (LEXAPRO) 20 MG tablet Take 1 tablet (20 mg total) by mouth daily. 90 tablet 1  . fish oil-omega-3 fatty acids 1000 MG capsule Take 1 g by mouth 3 (three) times daily.     . megestrol (MEGACE) 40 MG/ML suspension Take 10 mLs (400 mg total) by mouth 2 (two) times daily. 240 mL 1  . Multiple Vitamin (MULTIVITAMIN) tablet Take 1 tablet by mouth daily. Reported on 08/25/2015    . simvastatin (ZOCOR) 40 MG tablet Take 1 tablet (40 mg total) by mouth every evening. 90 tablet 2   No current facility-administered medications for  this visit.     Allergies:   Tape; Alendronate sodium; Cortizone-10 [hydrocortisone]; Oxycodone-acetaminophen; and Penicillins   Social History:  The patient  reports that she has never smoked. She has never used smokeless tobacco. She reports that she drinks alcohol. She reports that she does not use drugs.   Family History:  The patient's family history includes Diabetes in her sister; Heart disease in her sister; Kidney disease in her brother and sister; Prostate cancer in her father; Stomach cancer in her maternal aunt.    ROS:  Please see the history of present illness.   Otherwise, review of systems is positive for Weight loss, appetite change, chills, fatigue, depression, anxiety, dizziness, syncope, easy bruising or.   All other systems are reviewed and negative.    PHYSICAL EXAM: VS:  BP (!) 166/97   Pulse 81   Ht 5' (1.524 m)   Wt 134 lb (60.8 kg)   BMI 26.17 kg/m  , BMI Body mass index is 26.17 kg/m. GEN: Well nourished, well developed, in no acute distress  HEENT: normal  Neck: no JVD, carotid bruits, or masses Cardiac: RRR; no murmurs, rubs, or gallops,no edema  Respiratory:  clear to auscultation bilaterally, normal work of breathing GI: soft, nontender, nondistended, + BS MS: no deformity or atrophy  Skin: warm and dry Neuro:  Strength and sensation are intact Psych: euthymic mood, full affect  EKG:  EKG is not ordered today Personal review of the ECG ordered 08/24/15 shows sinus rhythm  Orthostatic VS for the past 24 hrs:  BP- Lying Pulse- Lying BP- Sitting Pulse- Sitting BP- Standing at 0 minutes Pulse- Standing at 0 minutes  12/16/15 1402 162/87 67 161/72 71 149/82 74   Recent Labs: 05/11/2015: TSH 1.32 08/22/2015: ALT 34 08/25/2015: BUN 16; Creatinine, Ser 0.97; Hemoglobin 13.5; Platelets 142.0; Potassium 4.2; Sodium 140    Lipid Panel     Component Value Date/Time   CHOL 133 03/30/2015 0852   TRIG 97.0 03/30/2015 0852   TRIG 40 02/14/2006 1005   HDL  48.80 03/30/2015 0852   CHOLHDL 3 03/30/2015 0852   VLDL 19.4 03/30/2015 0852   LDLCALC 65 03/30/2015 0852     Wt Readings from Last 3 Encounters:  12/16/15 134 lb (60.8 kg)  10/07/15 133 lb 4 oz (60.4 kg)  09/21/15 132 lb (59.9 kg)      Other studies Reviewed: Additional studies/ records that were reviewed today include: PCP notes   ASSESSMENT AND PLAN:  1.  Orthostatic hypotension: Has been improved with increased fluid intake.  Orthostasis likely multifactorial.  She continues to feel well without major complaint. Continued to encourage fluid  intake which has seemed to help her orthostasis.  As she is feeling well currently, Shanie Mauzy see her on an as needed basis.    Current medicines are reviewed at length with the patient today.   The patient does not have concerns regarding her medicines.  The following changes were made today:  none  Labs/ tests ordered today include:  No orders of the defined types were placed in this encounter.    Disposition:   FU with Ashlay Altieri PRN  Signed, Sonya Gunnoe Meredith Leeds, MD  12/16/2015 2:14 PM     [place] [address] [place]  [place] [address] [phone] (office) [phone] (fax)

## 2016-01-01 ENCOUNTER — Other Ambulatory Visit: Payer: Self-pay | Admitting: Internal Medicine

## 2016-01-26 ENCOUNTER — Telehealth: Payer: Self-pay | Admitting: Internal Medicine

## 2016-01-26 NOTE — Telephone Encounter (Signed)
Immunization record updated.

## 2016-01-26 NOTE — Telephone Encounter (Signed)
Pt called in to update PCP she received her flu shot on Sept 12,2017 at [facility] on [facility] and [address]

## 2016-02-09 ENCOUNTER — Ambulatory Visit (INDEPENDENT_AMBULATORY_CARE_PROVIDER_SITE_OTHER): Payer: PPO | Admitting: Internal Medicine

## 2016-02-09 ENCOUNTER — Encounter: Payer: Self-pay | Admitting: Internal Medicine

## 2016-02-09 VITALS — BP 128/78 | HR 73 | Temp 97.7°F | Resp 14 | Ht 60.0 in | Wt 137.5 lb

## 2016-02-09 DIAGNOSIS — F419 Anxiety disorder, unspecified: Secondary | ICD-10-CM

## 2016-02-09 DIAGNOSIS — F418 Other specified anxiety disorders: Secondary | ICD-10-CM

## 2016-02-09 DIAGNOSIS — F329 Major depressive disorder, single episode, unspecified: Secondary | ICD-10-CM

## 2016-02-09 DIAGNOSIS — R42 Dizziness and giddiness: Secondary | ICD-10-CM | POA: Diagnosis not present

## 2016-02-09 DIAGNOSIS — R5383 Other fatigue: Secondary | ICD-10-CM | POA: Diagnosis not present

## 2016-02-09 MED ORDER — CLONAZEPAM 0.5 MG PO TABS: 0.5000 mg | ORAL_TABLET | Freq: Three times a day (TID) | ORAL | 0 refills | Status: DC | PRN

## 2016-02-09 NOTE — Progress Notes (Signed)
Subjective:    Patient ID: Rebecca [name], female    DOB: 08/02/1940, 75 y.o.   MRN: [id number]  DOS:  02/09/2016 Type of visit - description : f/u Interval history: Since the last office visit she is doing better. Depression anxiety: Well-controlled. Appetite has improved Fatigue resolved She is back doing things she enjoyed like hobbies and helping people.  Wt Readings from Last 3 Encounters:  02/09/16 137 lb 8 oz (62.4 kg)  12/16/15 134 lb (60.8 kg)  10/07/15 133 lb 4 oz (60.4 kg)     Review of Systems  Denies chest pain or difficulty breathing No nausea, vomiting or diarrhea  Past Medical History:  Diagnosis Date  . Anxiety   . Arthritis    degenerative lumbar spine   . Chronic neck pain   . Complication of anesthesia    at one time my blood pressure dropped -lumbar - on bp med at that time  . Depression   . Dysphagia    esophageal stretched 9-14   . History of blood transfusion 2009   post - lumbar fusion   . Hyperlipidemia   . Hypertension    No longer takes med.  . Osteoarthritis of left shoulder region    Mild, Dr. Mardelle Matte  . Osteoporosis     Past Surgical History:  Procedure Laterality Date  . ABDOMINAL HYSTERECTOMY    . ANTERIOR CERVICAL DECOMP/DISCECTOMY FUSION N/A 11/20/2014   Procedure: ANTERIOR CERVICAL DECOMPRESSION/DISCECTOMY FUSION 2 LEVELS;  Surgeon: Phylliss Bob, MD;  Location: [facility];  Service: Orthopedics;  Laterality: N/A;  Anterior cervical decompression fusion, cervical 3-4, cervical 4-5 with instrumentation and allograft  . arm surgery Bilateral    transposed ulner nerve 10 (dr. Amedeo Plenty)  . BACK SURGERY  2009   Dr Jeralyn Bennett SURGERY  [id number]   Dr Saintclair Halsted  . carpal tunnell  2006   bilateral  . CHOLECYSTECTOMY    . COLONOSCOPY    . KNEE SURGERY  9-11   right,  scope; left knee  . NECK SURGERY  05/2008   Dr Saintclair Halsted  . OOPHORECTOMY Bilateral   . PARTIAL KNEE ARTHROPLASTY  01/17/2012   LEFT KNEE  . PARTIAL KNEE ARTHROPLASTY   01/17/2012   Procedure: UNICOMPARTMENTAL KNEE;  Surgeon: Johnny Bridge, MD;  Location: [facility];  Service: Orthopedics;  Laterality: Left;  . RHINOPLASTY  1980  . SHOULDER SURGERY Bilateral    x 2/Dr. Collier Salina  . TONSILLECTOMY      Social History   Social History  . Marital status: Married    Spouse name: [name].  . Number of children: 4  . Years of education: N/A   Occupational History  . stay home     Social History Main Topics  . Smoking status: Never Smoker  . Smokeless tobacco: Never Used  . Alcohol use 0.0 oz/week     Comment: rare  . Drug use: No  . Sexual activity: Not Currently   Other Topics Concern  . Not on file   Social History Narrative   Husband has cancer    1 child lives in this area         Medication List       Accurate as of 02/09/16 11:59 PM. Always use your most recent med list.          aspirin 81 MG tablet Take 81 mg by mouth daily.   calcium-vitamin D 250-125 MG-UNIT tablet Commonly known as:  OSCAL WITH D Take  1 tablet by mouth 2 (two) times daily.   clonazePAM 0.5 MG tablet Commonly known as:  KLONOPIN Take 1 tablet (0.5 mg total) by mouth 3 (three) times daily as needed for anxiety.   D3-1000 PO Take 1,000 Units by mouth daily.   escitalopram 20 MG tablet Commonly known as:  LEXAPRO Take 1 tablet (20 mg total) by mouth daily.   fish oil-omega-3 fatty acids 1000 MG capsule Take 1 g by mouth 3 (three) times daily.   megestrol 40 MG/ML suspension Commonly known as:  MEGACE Take 10 mLs (400 mg total) by mouth 2 (two) times daily.   multivitamin tablet Take 1 tablet by mouth daily. Reported on 08/25/2015   simvastatin 40 MG tablet Commonly known as:  ZOCOR Take 1 tablet (40 mg total) by mouth every evening.          Objective:   Physical Exam BP 128/78 (BP Location: Left Arm, Patient Position: Sitting, Cuff Size: Small)   Pulse 73   Temp 97.7 F (36.5 C) (Oral)   Resp 14   Ht 5' (1.524 m)   Wt 137 lb 8 oz  (62.4 kg)   SpO2 97%   BMI 26.85 kg/m  General:   Well developed, well nourished . NAD.  HEENT:  Normocephalic . Face symmetric, atraumatic Lungs:  CTA B Normal respiratory effort, no intercostal retractions, no accessory muscle use. Heart: RRR,  no murmur.  No pretibial edema bilaterally  Skin: Not pale. Not jaundice Neurologic:  alert & oriented X3.  Speech normal, gait appropriate for age and unassisted Psych--  Cognition and judgment appear intact.  Cooperative with normal attention span and concentration.  Behavior appropriate. No anxious or depressed appearing.      Assessment & Plan:     Assessment> HTN -- on no med since ~ 2009 Hyperlipidemia Anxiety depression: onset Fall 2016 d/t husband health, lost sister 03-2015. rx lexapro 02-2015 GERD Esophageal stricture stretched 2014  EGD/2017--->  gastritis, bx---reactive gastropathy, no H. pylori MSK: --DJD --Chronic neck pain  --spinal stenosis --multiple surgeries Osteoporosis DEXA osteoprosis 2006 (per pt) DEXA 11-08 normal  DEXA 9-12 mild osteopenia, rec exercise, ca and vit D Ao [place] 03-2015 - no AAA  PLAN:  Fatigue, orthostatic hypotension, dizziness, poor appetite, weight loss: All sx essentially resolved. In retrospect, depression was most likely culprit of many of her problems. Anxiety depression: Well-controlled, continue Lexapro, continue clonazepam which she takes sporadically hyperlipidemia: Continue simvastatin, last FLP satisfactory, recheck on RTC. Noted her calcium to be slightly elevated last time, recheck on RTC. Follow-up 3 months, CPX

## 2016-02-09 NOTE — Patient Instructions (Signed)
  GO TO THE FRONT DESK Schedule your next appointment for a  Physical in 3 months, fasting

## 2016-02-09 NOTE — Progress Notes (Signed)
Pre visit review using our clinic review tool, if applicable. No additional management support is needed unless otherwise documented below in the visit note. 

## 2016-02-10 NOTE — Assessment & Plan Note (Signed)
Fatigue, orthostatic hypotension, dizziness, poor appetite, weight loss: All sx essentially resolved. In retrospect, depression was most likely culprit of many of her problems. Anxiety depression: Well-controlled, continue Lexapro, continue clonazepam which she takes sporadically hyperlipidemia: Continue simvastatin, last FLP satisfactory, recheck on RTC. Noted her calcium to be slightly elevated last time, recheck on RTC. Follow-up 3 months, CPX

## 2016-05-18 ENCOUNTER — Ambulatory Visit (INDEPENDENT_AMBULATORY_CARE_PROVIDER_SITE_OTHER): Payer: PPO | Admitting: Internal Medicine

## 2016-05-18 ENCOUNTER — Encounter: Payer: Self-pay | Admitting: Internal Medicine

## 2016-05-18 VITALS — BP 122/70 | HR 66 | Temp 98.2°F | Resp 14 | Ht 60.0 in | Wt 140.0 lb

## 2016-05-18 DIAGNOSIS — E785 Hyperlipidemia, unspecified: Secondary | ICD-10-CM

## 2016-05-18 DIAGNOSIS — Z23 Encounter for immunization: Secondary | ICD-10-CM

## 2016-05-18 DIAGNOSIS — Z0000 Encounter for general adult medical examination without abnormal findings: Secondary | ICD-10-CM

## 2016-05-18 DIAGNOSIS — Z09 Encounter for follow-up examination after completed treatment for conditions other than malignant neoplasm: Secondary | ICD-10-CM

## 2016-05-18 LAB — BASIC METABOLIC PANEL
BUN: 23 mg/dL (ref 6–23)
CALCIUM: 9.8 mg/dL (ref 8.4–10.5)
CHLORIDE: 112 meq/L (ref 96–112)
CO2: 25 meq/L (ref 19–32)
Creatinine, Ser: 1.14 mg/dL (ref 0.40–1.20)
GFR: 49.26 mL/min — ABNORMAL LOW (ref >60.00)
GLUCOSE: 91 mg/dL (ref 70–99)
Potassium: 5.1 mEq/L (ref 3.5–5.1)
SODIUM: 143 meq/L (ref 135–145)

## 2016-05-18 LAB — LIPID PANEL
CHOLESTEROL: 122 mg/dL (ref 0–200)
HDL: 50.4 mg/dL (ref >39.00)
LDL CALC: 59 mg/dL (ref 0–99)
NonHDL: 71.12
Total CHOL/HDL Ratio: 2
Triglycerides: 59 mg/dL (ref 0.0–149.0)
VLDL: 11.8 mg/dL (ref 0.0–40.0)

## 2016-05-18 LAB — AST: AST: 30 U/L (ref 0–37)

## 2016-05-18 LAB — ALT: ALT: 35 U/L (ref 0–35)

## 2016-05-18 MED ORDER — SIMVASTATIN 40 MG PO TABS: 40.0000 mg | ORAL_TABLET | Freq: Every evening | ORAL | 2 refills | Status: DC

## 2016-05-18 NOTE — Progress Notes (Signed)
Subjective:    Patient ID: Rebecca [name], female    DOB: 04/21/1940, 76 y.o.   MRN: [id number]  DOS:  05/18/2016 Type of visit - description : cpx Interval history: In general feeling well. Anxiety depression under control, would like to decrease Lexapro dose. Takes clonazepam only at night Appetite is well most days, takes Megace for all infrequently She remains active w/ yard and home chores .   Review of Systems Specifically denies chest pain, difficulty breathing, nausea, vomiting, diarrhea. Other than above, a 14 point review of systems is negative     Past Medical History:  Diagnosis Date  . Anxiety   . Arthritis    degenerative lumbar spine   . Chronic neck pain   . Complication of anesthesia    at one time my blood pressure dropped -lumbar - on bp med at that time  . Depression   . Dysphagia    esophageal stretched 9-14   . History of blood transfusion 2009   post - lumbar fusion   . Hyperlipidemia   . Hypertension    No longer takes med.  . Osteoarthritis of left shoulder region    Mild, Dr. Mardelle Matte  . Osteoporosis     Past Surgical History:  Procedure Laterality Date  . ABDOMINAL HYSTERECTOMY    . ANTERIOR CERVICAL DECOMP/DISCECTOMY FUSION N/A 11/20/2014   Procedure: ANTERIOR CERVICAL DECOMPRESSION/DISCECTOMY FUSION 2 LEVELS;  Surgeon: Phylliss Bob, MD;  Location: [facility];  Service: Orthopedics;  Laterality: N/A;  Anterior cervical decompression fusion, cervical 3-4, cervical 4-5 with instrumentation and allograft  . arm surgery Bilateral    transposed ulner nerve 10 (dr. Amedeo Plenty)  . BACK SURGERY  2009   Dr Jeralyn Bennett SURGERY  [phone]   Dr Saintclair Halsted  . carpal tunnell  2006   bilateral  . CHOLECYSTECTOMY    . COLONOSCOPY    . KNEE SURGERY  9-11   right,  scope; left knee  . NECK SURGERY  05/2008   Dr Saintclair Halsted  . OOPHORECTOMY Bilateral   . PARTIAL KNEE ARTHROPLASTY  01/17/2012   LEFT KNEE  . PARTIAL KNEE ARTHROPLASTY  01/17/2012   Procedure:  UNICOMPARTMENTAL KNEE;  Surgeon: Johnny Bridge, MD;  Location: [facility];  Service: Orthopedics;  Laterality: Left;  . RHINOPLASTY  1980  . SHOULDER SURGERY Bilateral    x 2/Dr. Collier Salina  . TONSILLECTOMY      Social History   Social History  . Marital status: Married    Spouse name: [name].  . Number of children: 4  . Years of education: N/A   Occupational History  . stay home     Social History Main Topics  . Smoking status: Never Smoker  . Smokeless tobacco: Never Used  . Alcohol use 0.0 oz/week     Comment: rare  . Drug use: No  . Sexual activity: Not Currently   Other Topics Concern  . Not on file   Social History Narrative   Husband has cancer    1 child lives in this area      Family History  Problem Relation Age of Onset  . Prostate cancer Father   . Diabetes Sister   . Heart disease Sister     sister and brothers x 2  . Kidney disease Sister   . Kidney disease Brother   . Stomach cancer Maternal Aunt   . Colon cancer Neg Hx   . Breast cancer Neg Hx  Allergies as of 05/18/2016      Reactions   Tape Other (See Comments)   White adhesive tape -blisters   Alendronate Sodium Other (See Comments)   REACTION: jaw pain   Cortizone-10 [hydrocortisone] Other (See Comments)   Pt received cortizone shot, and became very flushed, and hot, blisters, raw areas around lips & ears    Oxycodone-acetaminophen Hives   Penicillins Swelling, Rash      Medication List       Accurate as of 05/18/16  9:56 AM. Always use your most recent med list.          aspirin 81 MG tablet Take 81 mg by mouth daily.   calcium-vitamin D 250-125 MG-UNIT tablet Commonly known as:  OSCAL WITH D Take 1 tablet by mouth 2 (two) times daily.   clonazePAM 0.5 MG tablet Commonly known as:  KLONOPIN Take 1 tablet (0.5 mg total) by mouth 3 (three) times daily as needed for anxiety.   D3-1000 PO Take 1,000 Units by mouth daily.   escitalopram 20 MG tablet Commonly known as:   LEXAPRO Take 1 tablet (20 mg total) by mouth daily.   fish oil-omega-3 fatty acids 1000 MG capsule Take 1 g by mouth 3 (three) times daily.   megestrol 40 MG/ML suspension Commonly known as:  MEGACE Take 10 mLs (400 mg total) by mouth 2 (two) times daily.   multivitamin tablet Take 1 tablet by mouth daily. Reported on 08/25/2015   simvastatin 40 MG tablet Commonly known as:  ZOCOR Take 1 tablet (40 mg total) by mouth every evening.          Objective:   Physical Exam BP 122/70 (BP Location: Left Arm, Patient Position: Sitting, Cuff Size: Small)   Pulse 66   Temp 98.2 F (36.8 C) (Oral)   Resp 14   Ht 5' (1.524 m)   Wt 140 lb (63.5 kg)   SpO2 97%   BMI 27.34 kg/m   General:   Well developed, well nourished . NAD.  Neck: No  thyromegaly  HEENT:  Normocephalic . Face symmetric, atraumatic Lungs:  CTA B Normal respiratory effort, no intercostal retractions, no accessory muscle use. Heart: RRR,  no murmur.  No pretibial edema bilaterally  Abdomen:  Not distended, soft, non-tender. No rebound or rigidity.  + Palpable nontender aorta at the abdomen, no bruit. Skin: Exposed areas without rash. Not pale. Not jaundice Neurologic:  alert & oriented X3.  Speech normal, gait appropriate for age and unassisted Strength symmetric and appropriate for age.  Psych: Cognition and judgment appear intact.  Cooperative with normal attention span and concentration.  Behavior appropriate. No anxious or depressed appearing.    Assessment & Plan:   Assessment> HTN -- on no med since ~ 2009 Hyperlipidemia Anxiety depression: onset Fall 2016 d/t husband health, lost sister 03-2015. rx lexapro 02-2015 GERD Esophageal stricture stretched 2014  EGD/2017--->  gastritis, bx---reactive gastropathy, no H. pylori MSK: --DJD --Chronic neck pain  --spinal stenosis --multiple surgeries Osteoporosis DEXA osteoprosis 2006 (per pt) DEXA 11-08 normal  DEXA 9-12 mild osteopenia, rec  exercise, ca and vit D Ao [place] 03-2015 - no AAA  PLAN: Hyperlipidemia: On simvastatin, check a BMP, FLP, LFTs. Anxiety depression: Controlled on Lexapro 20 mg, taking clonazepam at night only. Like to decrease medication, we agreed to drop Lexapro to 10 mg. Reassess in 6 months. UDS today  Poor appetite: Much improved, takes megace occasionally. RTC 6 months.

## 2016-05-18 NOTE — Patient Instructions (Addendum)
GO TO THE LAB : Get the blood work  . Also provide a urine sample for a UDS   GO TO THE FRONT DESK Schedule your next appointment for a  routine checkup in 6 months  Also schedule a Medicare wellness with one of our nurses  Decrease escitalopram to half tablet daily (10 mg a day)

## 2016-05-18 NOTE — Assessment & Plan Note (Addendum)
---  Td: booster 05-2016; pneumonia shot 2002, 2008; prevnar-- 2015; shingles shot-- 12-2009 Had a flu shot ---CCS:   normal  Cscope  2003, Cscope 2-14 nopolyps ---PAPs no longer indicated  ---MMG 07-2013 neg , (-) FH, never had an abnormal MMG. She elects no screening to this time. Will discuss next year +FH CAD, pt asx, on statins an ASA ---Palpable aorta: [place] Ao 03-2015 wnl --Diet and exercise discussed

## 2016-05-18 NOTE — Progress Notes (Signed)
Pre visit review using our clinic review tool, if applicable. No additional management support is needed unless otherwise documented below in the visit note. 

## 2016-05-19 DIAGNOSIS — Z79899 Other long term (current) drug therapy: Secondary | ICD-10-CM | POA: Diagnosis not present

## 2016-05-22 NOTE — Assessment & Plan Note (Signed)
Hyperlipidemia: On simvastatin, check a BMP, FLP, LFTs. Anxiety depression: Controlled on Lexapro 20 mg, taking clonazepam at night only. Like to decrease medication, we agreed to drop Lexapro to 10 mg. Reassess in 6 months. UDS today  Poor appetite: Much improved, takes megace occasionally. RTC 6 months.

## 2016-05-26 ENCOUNTER — Telehealth: Payer: Self-pay

## 2016-05-26 NOTE — Telephone Encounter (Signed)
UDS: 05/19/2016  7-Aminoclonazepam (metabolite of Clonazepam) positive  Low risk per Dr. Larose Kells 05/26/2016

## 2016-06-06 ENCOUNTER — Encounter: Payer: Self-pay | Admitting: Internal Medicine

## 2016-08-11 ENCOUNTER — Telehealth: Payer: Self-pay | Admitting: Internal Medicine

## 2016-08-11 NOTE — Telephone Encounter (Signed)
Called patient to schedule awv. Pt stated that she will call office back to schedule appt.

## 2016-08-21 ENCOUNTER — Other Ambulatory Visit: Payer: Self-pay | Admitting: Internal Medicine

## 2016-08-22 ENCOUNTER — Other Ambulatory Visit: Payer: Self-pay

## 2016-08-22 MED ORDER — ESCITALOPRAM OXALATE 10 MG PO TABS: 10.0000 mg | ORAL_TABLET | Freq: Every day | ORAL | 5 refills | Status: DC

## 2016-10-18 ENCOUNTER — Telehealth: Payer: Self-pay | Admitting: *Deleted

## 2016-10-18 NOTE — Telephone Encounter (Signed)
Mr and Mrs Stines AWV appt changed to 01/12/17 @ 8 &9.

## 2016-11-07 DIAGNOSIS — H04123 Dry eye syndrome of bilateral lacrimal glands: Secondary | ICD-10-CM | POA: Diagnosis not present

## 2016-11-07 DIAGNOSIS — H31091 Other chorioretinal scars, right eye: Secondary | ICD-10-CM | POA: Diagnosis not present

## 2016-11-07 DIAGNOSIS — D3132 Benign neoplasm of left choroid: Secondary | ICD-10-CM | POA: Diagnosis not present

## 2016-11-07 DIAGNOSIS — H25813 Combined forms of age-related cataract, bilateral: Secondary | ICD-10-CM | POA: Diagnosis not present

## 2016-11-16 ENCOUNTER — Ambulatory Visit (INDEPENDENT_AMBULATORY_CARE_PROVIDER_SITE_OTHER): Payer: PPO | Admitting: Internal Medicine

## 2016-11-16 ENCOUNTER — Encounter: Payer: Self-pay | Admitting: Internal Medicine

## 2016-11-16 VITALS — BP 118/62 | HR 71 | Temp 98.0°F | Resp 14 | Ht 60.0 in | Wt 138.5 lb

## 2016-11-16 DIAGNOSIS — E785 Hyperlipidemia, unspecified: Secondary | ICD-10-CM | POA: Diagnosis not present

## 2016-11-16 DIAGNOSIS — F329 Major depressive disorder, single episode, unspecified: Secondary | ICD-10-CM | POA: Diagnosis not present

## 2016-11-16 DIAGNOSIS — F419 Anxiety disorder, unspecified: Secondary | ICD-10-CM | POA: Insufficient documentation

## 2016-11-16 NOTE — Progress Notes (Signed)
Subjective:    Patient ID: Rebecca [name], female    DOB: 06/01/1940, 76 y.o.   MRN: [id number]  DOS:  11/16/2016 Type of visit - description : rov Interval history: In general feeling well. Appetite is okay, not taking Megace Anxiety depression: Still taking Lexapro and clonazepam.   Review of Systems   Past Medical History:  Diagnosis Date  . Anxiety   . Arthritis    degenerative lumbar spine   . Chronic neck pain   . Complication of anesthesia    at one time my blood pressure dropped -lumbar - on bp med at that time  . Depression   . Dysphagia    esophageal stretched 9-14   . History of blood transfusion 2009   post - lumbar fusion   . Hyperlipidemia   . Hypertension    No longer takes med.  . Osteoarthritis of left shoulder region    Mild, Dr. Mardelle Matte  . Osteoporosis     Past Surgical History:  Procedure Laterality Date  . ABDOMINAL HYSTERECTOMY    . ANTERIOR CERVICAL DECOMP/DISCECTOMY FUSION N/A 11/20/2014   Procedure: ANTERIOR CERVICAL DECOMPRESSION/DISCECTOMY FUSION 2 LEVELS;  Surgeon: Phylliss Bob, MD;  Location: [facility];  Service: Orthopedics;  Laterality: N/A;  Anterior cervical decompression fusion, cervical 3-4, cervical 4-5 with instrumentation and allograft  . arm surgery Bilateral    transposed ulner nerve 10 (dr. Amedeo Plenty)  . BACK SURGERY  2009   Dr Jeralyn Bennett SURGERY  [phone]   Dr Saintclair Halsted  . carpal tunnell  2006   bilateral  . CHOLECYSTECTOMY    . COLONOSCOPY    . KNEE SURGERY  9-11   right,  scope; left knee  . NECK SURGERY  05/2008   Dr Saintclair Halsted  . OOPHORECTOMY Bilateral   . PARTIAL KNEE ARTHROPLASTY  01/17/2012   LEFT KNEE  . PARTIAL KNEE ARTHROPLASTY  01/17/2012   Procedure: UNICOMPARTMENTAL KNEE;  Surgeon: Johnny Bridge, MD;  Location: [facility];  Service: Orthopedics;  Laterality: Left;  . RHINOPLASTY  1980  . SHOULDER SURGERY Bilateral    x 2/Dr. Collier Salina  . TONSILLECTOMY      Social History   Social History  . Marital status:  Married    Spouse name: [name].  . Number of children: 4  . Years of education: N/A   Occupational History  . stay home     Social History Main Topics  . Smoking status: Never Smoker  . Smokeless tobacco: Never Used  . Alcohol use 0.0 oz/week     Comment: rare  . Drug use: No  . Sexual activity: Not Currently   Other Topics Concern  . Not on file   Social History Narrative   Husband has cancer    1 child lives in this area       Allergies as of 11/16/2016      Reactions   Tape Other (See Comments)   White adhesive tape -blisters   Alendronate Sodium Other (See Comments)   REACTION: jaw pain   Cortizone-10 [hydrocortisone] Other (See Comments)   Pt received cortizone shot, and became very flushed, and hot, blisters, raw areas around lips & ears    Oxycodone-acetaminophen Hives   Penicillins Swelling, Rash      Medication List       Accurate as of 11/16/16  6:40 PM. Always use your most recent med list.          aspirin 81 MG tablet Take  81 mg by mouth daily.   clonazePAM 0.5 MG tablet Commonly known as:  KLONOPIN Take 1 tablet (0.5 mg total) by mouth 3 (three) times daily as needed for anxiety.   D3-1000 PO Take 1,000 Units by mouth daily.   escitalopram 10 MG tablet Commonly known as:  LEXAPRO Take 1 tablet (10 mg total) by mouth daily.   fish oil-omega-3 fatty acids 1000 MG capsule Take 1 g by mouth 3 (three) times daily.   multivitamin tablet Take 1 tablet by mouth daily. Reported on 08/25/2015   simvastatin 40 MG tablet Commonly known as:  ZOCOR Take 1 tablet (40 mg total) by mouth every evening.          Objective:   Physical Exam BP 118/62 (BP Location: Left Arm, Patient Position: Sitting, Cuff Size: Small)   Pulse 71   Temp 98 F (36.7 C) (Oral)   Resp 14   Ht 5' (1.524 m)   Wt 138 lb 8 oz (62.8 kg)   SpO2 98%   BMI 27.05 kg/m  General:   Well developed, well nourished . NAD.  HEENT:  Normocephalic . Face symmetric,  atraumatic Skin: Not pale. Not jaundice Neurologic:  alert & oriented X3.  Speech normal, gait appropriate for age and unassisted Psych--  Cognition and judgment appear intact.  Cooperative with normal attention span and concentration.  Behavior appropriate. No anxious or depressed appearing.      Assessment & Plan:   Assessment  HTN -- on no med since ~ 2009 Hyperlipidemia Anxiety depression: onset Fall 2016 d/t husband health, lost sister 03-2015. rx lexapro 02-2015 GI --GERD --Esophageal stricture stretched 2014  --EGD/2017--->  gastritis, bx---reactive gastropathy, no H. pylori MSK: --DJD --Chronic neck pain  --spinal stenosis --multiple surgeries Osteoporosis DEXA osteoprosis 2006 (per pt) DEXA 11-08 normal  DEXA 9-12 mild osteopenia, rec exercise, ca and vit D Ao [place] 03-2015 - no AAA  PLAN: Hyperlipidemia: Well-controlled, continue simvastatin   Anxiety depression: Currently on Lexapro, clonazepam 1 tablet at bedtime and occasionally takes 1/2 or 1/4 tablet during the daytime. Her husband health is not good, he has multiple myeloma. We had a long conversation about the issue, fortunately they both had a very healthy attitudes towards their illnesses, she is doing well at this point. RTC CPX 05-2017  Today, I spent more than  16  min with the patient: >50% of the time counseling regards Anxiety, depression, listening to all her concerns and given her my opinion

## 2016-11-16 NOTE — Assessment & Plan Note (Signed)
Hyperlipidemia: Well-controlled, continue simvastatin   Anxiety depression: Currently on Lexapro, clonazepam 1 tablet at bedtime and occasionally takes 1/2 or 1/4 tablet during the daytime. Her husband health is not good, he has multiple myeloma. We had a long conversation about the issue, fortunately they both had a very healthy attitudes towards their illnesses, she is doing well at this point. RTC CPX 05-2017

## 2016-11-16 NOTE — Patient Instructions (Signed)
GO TO THE FRONT DESK Schedule your next appointment for a  Physical in 6 months, fasting   

## 2016-11-16 NOTE — Progress Notes (Signed)
Pre visit review using our clinic review tool, if applicable. No additional management support is needed unless otherwise documented below in the visit note. 

## 2017-01-12 ENCOUNTER — Ambulatory Visit: Payer: PPO | Admitting: *Deleted

## 2017-02-13 ENCOUNTER — Other Ambulatory Visit: Payer: Self-pay | Admitting: Internal Medicine

## 2017-02-22 ENCOUNTER — Telehealth: Payer: Self-pay

## 2017-02-22 NOTE — Telephone Encounter (Signed)
Pt is requesting refill on clonazepam 0.5mg .  Last OV: 11/16/2016 Last Fill: 02/09/2016 #[id number] and 0RF UDS: 05/19/2016 Low risk  Please advise.

## 2017-02-23 MED ORDER — CLONAZEPAM 0.5 MG PO TABS: 0.5000 mg | ORAL_TABLET | Freq: Three times a day (TID) | ORAL | 0 refills | Status: DC | PRN

## 2017-02-23 NOTE — Telephone Encounter (Signed)
Noted, thank you

## 2017-02-23 NOTE — Telephone Encounter (Signed)
Prescription sent

## 2017-03-23 IMAGING — MR MR HEAD W/O CM
9 of 10 series · 42 of 48 positions shown · non-contrast
Comparison: None.

CLINICAL DATA: Dizziness, ortho static hypotension, fatigue,
syncopal episodes, nausea, and lack of appetite for 1 month. Rule
out stroke.

EXAM:
MRI HEAD WITHOUT CONTRAST
TECHNIQUE: Multiplanar, multiecho pulse sequences of the brain and surrounding
structures were obtained without intravenous contrast.

[Series 3: T1 · sagittal · 5.0mm · 0.45mm/px · 3 of 23 slices shown]
[im 1/23]
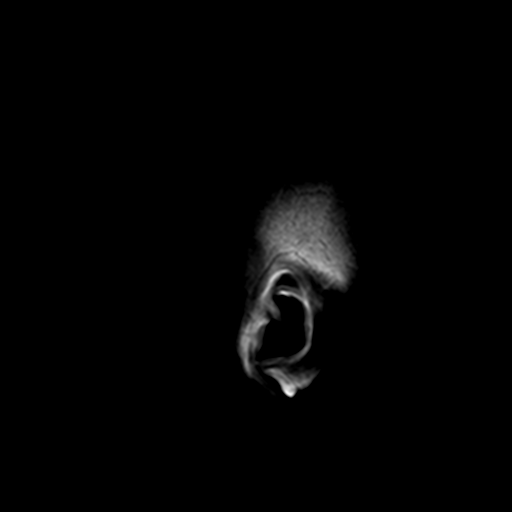
[im 12/23]
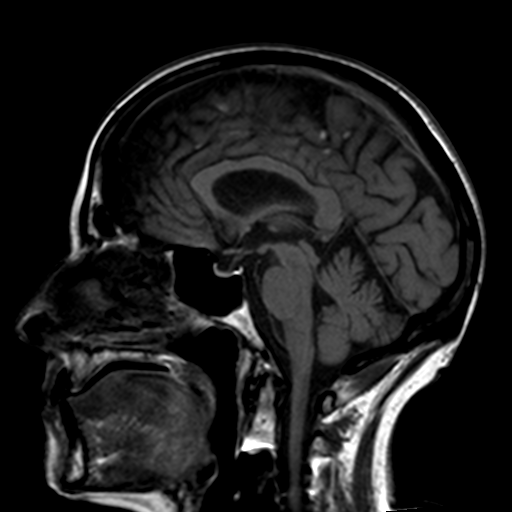
[im 23/23]
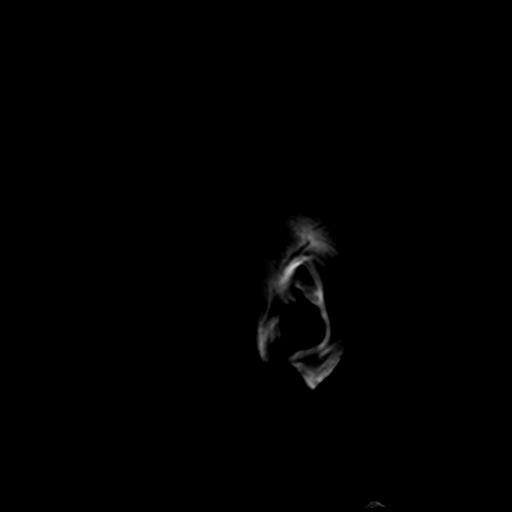

[Series 4: DWI · axial · 3.0mm · 2.19mm/px · z∈[-60,+85]mm · 10 of 90 slices shown (1 of 4)]
[im 1/90]
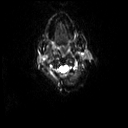
[im 10/90]
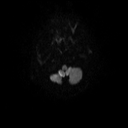
[im 20/90]
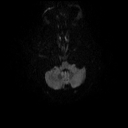
[im 30/90]
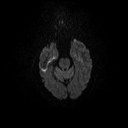
[im 40/90]
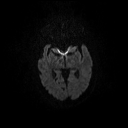
[im 50/90]
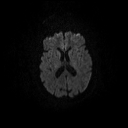
[im 60/90]
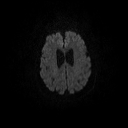
[im 70/90]
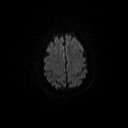
[im 80/90]
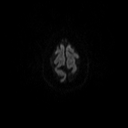
[im 90/90]
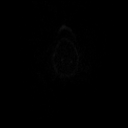

[Series 5: DWI · axial · 3.0mm · 2.19mm/px · z∈[-60,+85]mm · 5 of 45 slices shown (2 of 4)]
[im 1/45]
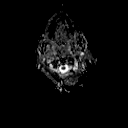
[im 12/45]
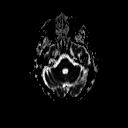
[im 23/45]
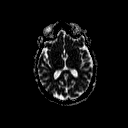
[im 34/45]
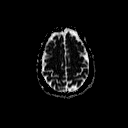
[im 45/45]
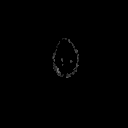

[Series 6: T2 · axial · 5.0mm · 0.45mm/px · z∈[-48,+105]mm · 2 of 23 slices shown (1 of 2)]
[im 1/23]
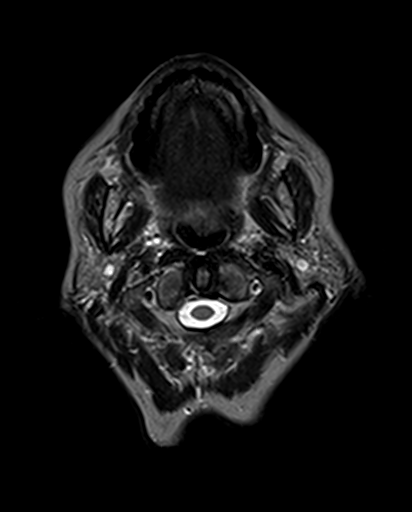
[im 23/23]
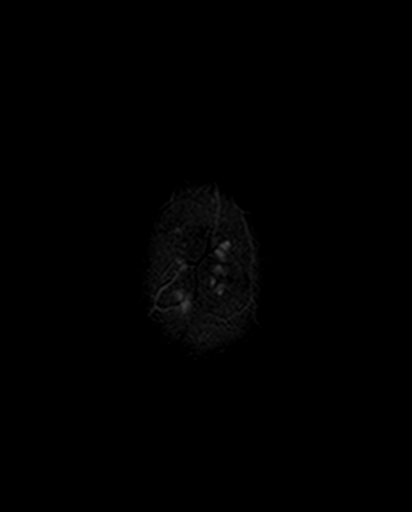

[Series 7: T2 · axial · 5.0mm · 0.45mm/px · z∈[-47,+105]mm · 2 of 23 slices shown (2 of 2)]
[im 1/23]
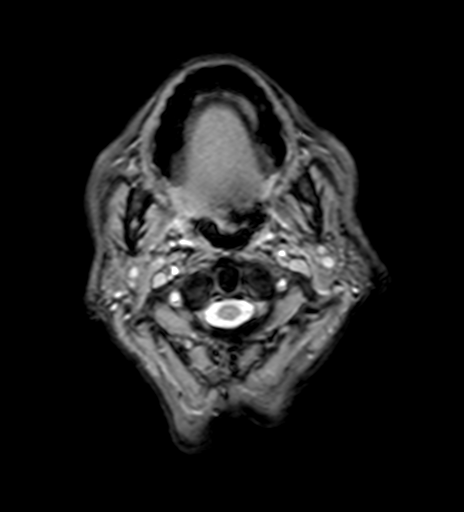
[im 23/23]
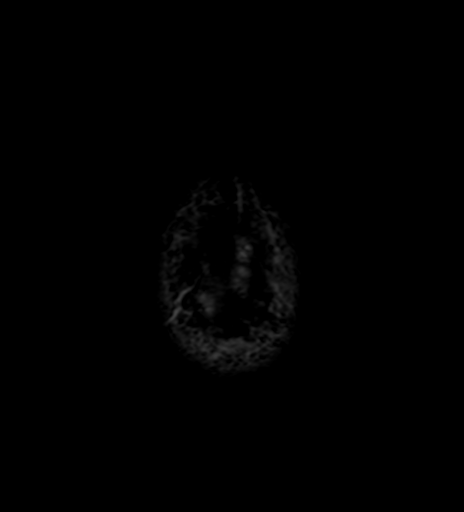

[Series 8: FLAIR · axial · 5.0mm · 0.45mm/px · z∈[-47,+105]mm · 2 of 23 slices shown]
[im 1/23]
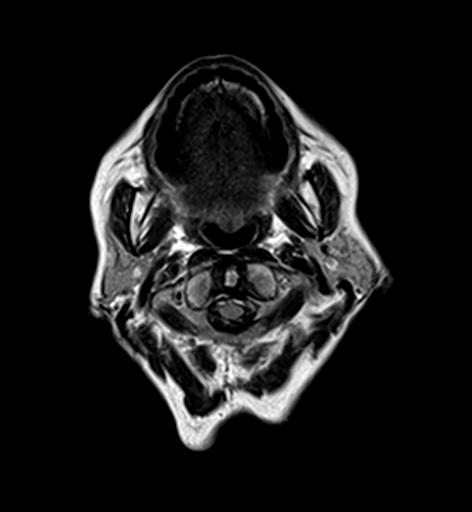
[im 23/23]
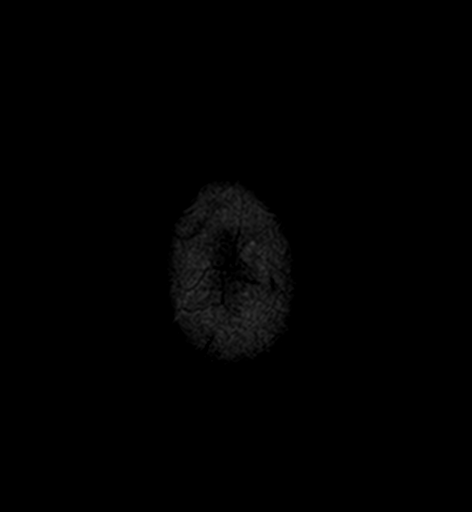

[Series 10: T2 post-contrast · coronal · 5.0mm · 0.45mm/px · 3 of 28 slices shown]
[im 1/28]
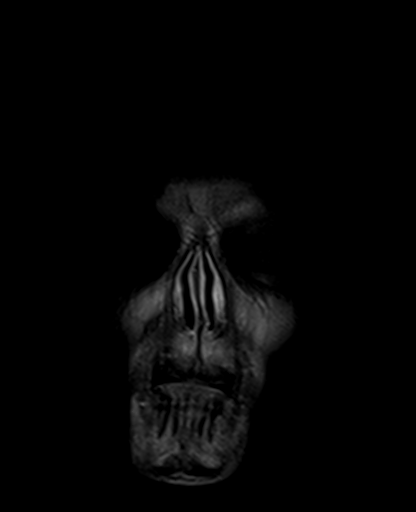
[im 14/28]
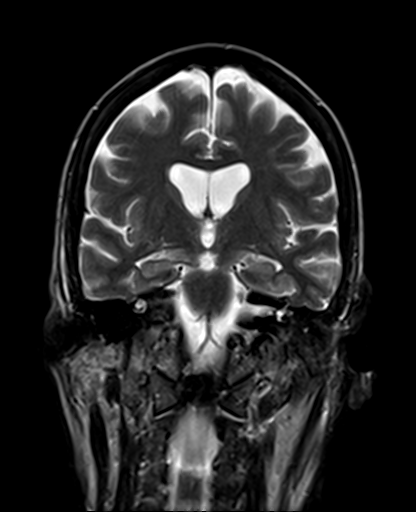
[im 28/28]
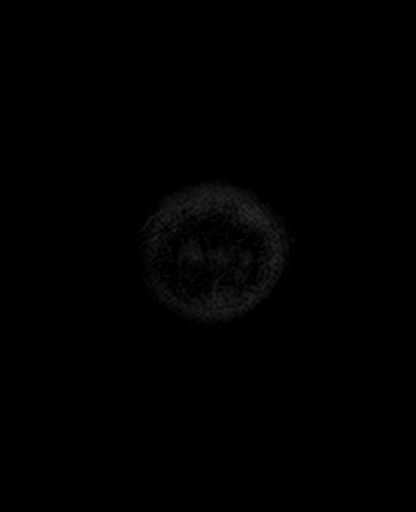

[Series 11: DWI · coronal · 3.0mm · 1.46mm/px · 10 of 90 slices shown (3 of 4)]
[im 1/90]
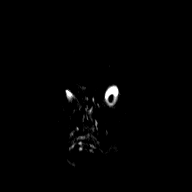
[im 10/90]
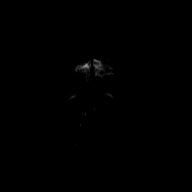
[im 20/90]
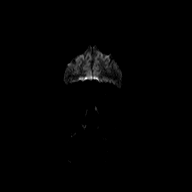
[im 30/90]
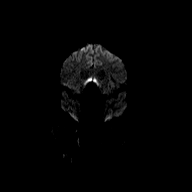
[im 40/90]
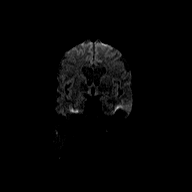
[im 50/90]
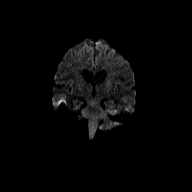
[im 60/90]
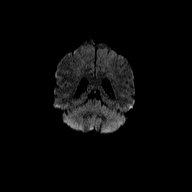
[im 70/90]
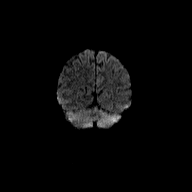
[im 80/90]
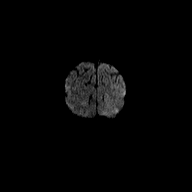
[im 90/90]
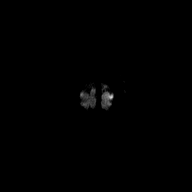

[Series 12: DWI · coronal · 3.0mm · 1.46mm/px · 5 of 45 slices shown (4 of 4)]
[im 1/45]
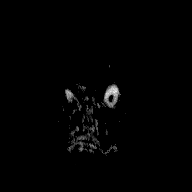
[im 12/45]
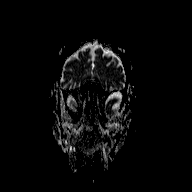
[im 23/45]
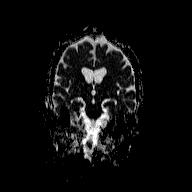
[im 34/45]
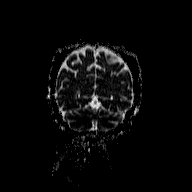
[im 45/45]
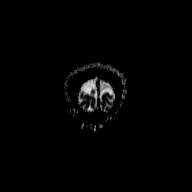

[42 of 48 positions shown; findings below may reference images not displayed]

FINDINGS: There is no evidence of acute infarct, intracranial hemorrhage,
mass, midline shift, or extra-axial fluid collection. There is age
related cerebral atrophy. No significant cerebral white matter
disease is seen.

Orbits are unremarkable. No significant paranasal sinus or mastoid
inflammatory change. Major intracranial vascular flow voids are
preserved. Partially visualized anterior cervical spine fusion.
IMPRESSION: Unremarkable appearance of the brain for age.

## 2017-05-17 NOTE — Progress Notes (Addendum)
Subjective:   Rebecca [name] is a 77 y.o. female who presents for Medicare Annual (Subsequent) preventive examination.  Review of Systems: No ROS.  Medicare Wellness Visit. Additional risk factors are reflected in the social history.  Cardiac Risk Factors include: advanced age (>69men, >29 women);dyslipidemia;hypertension;sedentary lifestyle Sleep patterns: Sleeps 8 hrs. Takes klonopin every night. Wakes occasionally to urinate, but falls back to sleep easily.  Home Safety/Smoke Alarms: Feels safe in home. Smoke alarms in place.  Living environment; residence and Firearm Safety: Lives with husband and son in 2 story home. Seat Belt Safety/Bike Helmet: Wears seat belt.   Female:   Pap-  hysterectomy     Mammo- declines   Dexa scan-  Last 05/29/15-osteoporosis.ORDERED     CCS- Last 05/08/12. Recall 10 yrs Eye- Dr.Groat last [phone] Dentist- next appt 08/03/17 [place] [facility]     Objective:     Vitals: BP (!) 112/56 (BP Location: Left Arm, Patient Position: Sitting, Cuff Size: Normal)   Pulse 63   Ht 5' (1.524 m)   Wt 148 lb (67.1 kg)   SpO2 98%   BMI 28.90 kg/m   Body mass index is 28.9 kg/m.  Advanced Directives 05/22/2017 08/22/2015 05/11/2015 11/18/2014 01/24/2014 01/16/2014 01/17/2012  Does Patient Have a Medical Advance Directive? Yes No Yes Yes Yes Yes Patient has advance directive, copy not in chart  Type of Advance Directive [place];Living will - [place];Living will Living will;Healthcare Power of [place] will [place];Living will  Does patient want to make changes to medical advance directive? No - Patient declined - - No - Patient declined - No - Patient declined -  Copy of [place] in Chart? Yes - No - copy requested No - copy requested - - Copy requested from other (Comment)    Tobacco Social History   Tobacco Use  Smoking Status Never Smoker  Smokeless Tobacco Never Used       Counseling given: Not Answered   Clinical Intake:     Pain : No/denies pain                 Past Medical History:  Diagnosis Date  . Anxiety   . Arthritis    degenerative lumbar spine   . Chronic neck pain   . Complication of anesthesia    at one time my blood pressure dropped -lumbar - on bp med at that time  . Depression   . Dysphagia    esophageal stretched 9-14   . History of blood transfusion 2009   post - lumbar fusion   . Hyperlipidemia   . Hypertension    No longer takes med.  . Osteoarthritis of left shoulder region    Mild, Dr. Mardelle Matte  . Osteoporosis    Past Surgical History:  Procedure Laterality Date  . ABDOMINAL HYSTERECTOMY    . ANTERIOR CERVICAL DECOMP/DISCECTOMY FUSION N/A 11/20/2014   Procedure: ANTERIOR CERVICAL DECOMPRESSION/DISCECTOMY FUSION 2 LEVELS;  Surgeon: Phylliss Bob, MD;  Location: [facility];  Service: Orthopedics;  Laterality: N/A;  Anterior cervical decompression fusion, cervical 3-4, cervical 4-5 with instrumentation and allograft  . arm surgery Bilateral    transposed ulner nerve 10 (dr. Amedeo Plenty)  . BACK SURGERY  2009   Dr Jeralyn Bennett SURGERY  [phone]   Dr Saintclair Halsted  . carpal tunnell  2006   bilateral  . CHOLECYSTECTOMY    . COLONOSCOPY    . KNEE SURGERY  9-11   right,  scope; left knee  . NECK SURGERY  05/2008   Dr Saintclair Halsted  . OOPHORECTOMY Bilateral   . PARTIAL KNEE ARTHROPLASTY  01/17/2012   LEFT KNEE  . PARTIAL KNEE ARTHROPLASTY  01/17/2012   Procedure: UNICOMPARTMENTAL KNEE;  Surgeon: Johnny Bridge, MD;  Location: [facility];  Service: Orthopedics;  Laterality: Left;  . RHINOPLASTY  1980  . SHOULDER SURGERY Bilateral    x 2/Dr. Collier Salina  . TONSILLECTOMY     Family History  Problem Relation Age of Onset  . Prostate cancer Father   . Diabetes Sister   . Heart disease Sister        sister and brothers x 2  . Kidney disease Sister   . Kidney disease Brother   . Heart disease Brother   . Stomach cancer Maternal Aunt   .  Colon cancer Neg Hx   . Breast cancer Neg Hx    Social History   Socioeconomic History  . Marital status: Married    Spouse name: [name].  . Number of children: 4  . Years of education: None  . Highest education level: None  Social Needs  . Financial resource strain: None  . Food insecurity - worry: None  . Food insecurity - inability: None  . Transportation needs - medical: None  . Transportation needs - non-medical: None  Occupational History  . Occupation: stay home   Tobacco Use  . Smoking status: Never Smoker  . Smokeless tobacco: Never Used  Substance and Sexual Activity  . Alcohol use: Yes    Alcohol/week: 0.0 oz    Comment: rare  . Drug use: No  . Sexual activity: Not Currently  Other Topics Concern  . None  Social History Narrative   Husband has cancer    1 child lives in this area     Outpatient Encounter Medications as of 05/22/2017  Medication Sig  . aspirin 81 MG tablet Take 81 mg by mouth daily.  . clonazePAM (KLONOPIN) 0.5 MG tablet Take 1 tablet (0.5 mg total) by mouth 3 (three) times daily as needed for anxiety.  Marland Kitchen escitalopram (LEXAPRO) 10 MG tablet Take 1 tablet (10 mg total) by mouth daily.  . Multiple Vitamin (MULTIVITAMIN) tablet Take 1 tablet by mouth daily. Reported on 08/25/2015  . simvastatin (ZOCOR) 40 MG tablet Take 1 tablet (40 mg total) by mouth daily.  . [DISCONTINUED] Cholecalciferol (D3-1000 PO) Take 1,000 Units by mouth daily.    . [DISCONTINUED] fish oil-omega-3 fatty acids 1000 MG capsule Take 1 g by mouth 3 (three) times daily.    No facility-administered encounter medications on file as of 05/22/2017.     Activities of Daily Living In your present state of health, do you have any difficulty performing the following activities: 05/22/2017  Hearing? N  Vision? N  Difficulty concentrating or making decisions? N  Walking or climbing stairs? N  Dressing or bathing? N  Doing errands, shopping? N  Preparing Food and eating ? N    Using the Toilet? N  In the past six months, have you accidently leaked urine? N  Do you have problems with loss of bowel control? N  Managing your Medications? N  Managing your Finances? N  Housekeeping or managing your Housekeeping? N  Some recent data might be hidden    Patient Care Team: Colon Branch, MD as PCP - General Kary Kos, MD as Consulting Physician (Neurosurgery) Marchia Bond, MD as Consulting Physician (Orthopedic Surgery)  Phylliss Bob, MD as Consulting Physician (Orthopedic Surgery)    Assessment:   This is a routine wellness examination for [place]. Physical assessment deferred to PCP.  Exercise Activities and Dietary recommendations Current Exercise Habits: The patient does not participate in regular exercise at present, Exercise limited by: None identified Diet (meal preparation, eat out, water intake, caffeinated beverages, dairy products, fruits and vegetables): in general, a "healthy" diet  , well balanced   Goals    . Increase physical activity     Wants to start back walking and tone her body.         Fall Risk Fall Risk  05/22/2017 05/18/2016 05/11/2015 03/30/2015 02/13/2015  Falls in the past year? No No No No No    Depression Screen PHQ 2/9 Scores 05/22/2017 05/18/2016 05/11/2015 03/30/2015  PHQ - 2 Score 0 0 1 1  PHQ- 9 Score - - - 4     Cognitive Function MMSE - Mini Mental State Exam 05/11/2015  Orientation to time 5  Orientation to Place 5  Registration 3  Attention/ Calculation 5  Recall 3  Language- name 2 objects 2  Language- repeat 1  Language- follow 3 step command 3  Language- read & follow direction 1  Write a sentence 1  Copy design 1  Total score 30        Immunization History  Administered Date(s) Administered  . Influenza Split 11/25/2011, 11/21/2013  . Influenza Whole 01/16/2007, 02/24/2009, 12/14/2009  . Influenza, High Dose Seasonal PF 12/19/2012  . Influenza-Unspecified 12/17/2014, 11/24/2015, 12/22/2016  .  Pneumococcal Conjugate-13 03/26/2014  . Pneumococcal Polysaccharide-23 01/16/2007  . Td 05/18/2016  . Zoster 12/29/2009   Screening Tests Health Maintenance  Topic Date Due  . DEXA SCAN  05/25/2017  . TETANUS/TDAP  05/19/2026  . INFLUENZA VACCINE  Completed  . PNA vac Low Risk Adult  Completed      Plan:   Follow up with Dr.Paz as scheduled 07/24/17.   Continue to eat heart healthy diet (full of fruits, vegetables, whole grains, lean protein, water--limit salt, fat, and sugar intake) and increase physical activity as tolerated.  Continue doing brain stimulating activities (puzzles, reading, adult coloring books, staying active) to keep memory sharp.     I have personally reviewed and noted the following in the patient's chart:   . Medical and social history . Use of alcohol, tobacco or illicit drugs  . Current medications and supplements . Functional ability and status . Nutritional status . Physical activity . Advanced directives . List of other physicians . Hospitalizations, surgeries, and ER visits in previous 12 months . Vitals . Screenings to include cognitive, depression, and falls . Referrals and appointments  In addition, I have reviewed and discussed with patient certain preventive protocols, quality metrics, and best practice recommendations. A written personalized care plan for preventive services as well as general preventive health recommendations were provided to patient.     Naaman Plummer [place], [place]  05/22/2017   Kathlene November, MD

## 2017-05-22 ENCOUNTER — Ambulatory Visit (INDEPENDENT_AMBULATORY_CARE_PROVIDER_SITE_OTHER): Payer: PPO | Admitting: *Deleted

## 2017-05-22 ENCOUNTER — Encounter: Payer: Self-pay | Admitting: *Deleted

## 2017-05-22 ENCOUNTER — Encounter: Payer: PPO | Admitting: Internal Medicine

## 2017-05-22 VITALS — BP 112/56 | HR 63 | Ht 60.0 in | Wt 148.0 lb

## 2017-05-22 DIAGNOSIS — Z0000 Encounter for general adult medical examination without abnormal findings: Secondary | ICD-10-CM

## 2017-05-22 DIAGNOSIS — Z780 Asymptomatic menopausal state: Secondary | ICD-10-CM | POA: Diagnosis not present

## 2017-05-22 NOTE — Patient Instructions (Signed)
Follow up with Dr.Paz as scheduled 07/24/17.   Continue to eat heart healthy diet (full of fruits, vegetables, whole grains, lean protein, water--limit salt, fat, and sugar intake) and increase physical activity as tolerated.  Continue doing brain stimulating activities (puzzles, reading, adult coloring books, staying active) to keep memory sharp.    [name] , Thank you for taking time to come for your Medicare Wellness Visit. I appreciate your ongoing commitment to your health goals. Please review the following plan we discussed and let me know if I can assist you in the future.   These are the goals we discussed: Goals    . Increase physical activity     Wants to start back walking and tone her body.         This is a list of the screening recommended for you and due dates:  Health Maintenance  Topic Date Due  . DEXA scan (bone density measurement)  05/25/2017  . Tetanus Vaccine  05/19/2026  . Flu Shot  Completed  . Pneumonia vaccines  Completed    Health Maintenance for Postmenopausal Women Menopause is a normal process in which your reproductive ability comes to an end. This process happens gradually over a span of months to years, usually between the ages of 48 and 55. Menopause is complete when you have missed 12 consecutive menstrual periods. It is important to talk with your health care provider about some of the most common conditions that affect postmenopausal women, such as heart disease, cancer, and bone loss (osteoporosis). Adopting a healthy lifestyle and getting preventive care can help to promote your health and wellness. Those actions can also lower your chances of developing some of these common conditions. What should I know about menopause? During menopause, you may experience a number of symptoms, such as:  Moderate-to-severe hot flashes.  Night sweats.  Decrease in sex drive.  Mood swings.  Headaches.  Tiredness.  Irritability.  Memory  problems.  Insomnia.  Choosing to treat or not to treat menopausal changes is an individual decision that you make with your health care provider. What should I know about hormone replacement therapy and supplements? Hormone therapy products are effective for treating symptoms that are associated with menopause, such as hot flashes and night sweats. Hormone replacement carries certain risks, especially as you become older. If you are thinking about using estrogen or estrogen with progestin treatments, discuss the benefits and risks with your health care provider. What should I know about heart disease and stroke? Heart disease, heart attack, and stroke become more likely as you age. This may be due, in part, to the hormonal changes that your body experiences during menopause. These can affect how your body processes dietary fats, triglycerides, and cholesterol. Heart attack and stroke are both medical emergencies. There are many things that you can do to help prevent heart disease and stroke:  Have your blood pressure checked at least every 1-2 years. High blood pressure causes heart disease and increases the risk of stroke.  If you are 55-79 years old, ask your health care provider if you should take aspirin to prevent a heart attack or a stroke.  Do not use any tobacco products, including cigarettes, chewing tobacco, or electronic cigarettes. If you need help quitting, ask your health care provider.  It is important to eat a healthy diet and maintain a healthy weight. ? Be sure to include plenty of vegetables, fruits, low-fat dairy products, and lean protein. ? Avoid eating foods that   are high in solid fats, added sugars, or salt (sodium).  Get regular exercise. This is one of the most important things that you can do for your health. ? Try to exercise for at least 150 minutes each week. The type of exercise that you do should increase your heart rate and make you sweat. This is known as  moderate-intensity exercise. ? Try to do strengthening exercises at least twice each week. Do these in addition to the moderate-intensity exercise.  Know your numbers.Ask your health care provider to check your cholesterol and your blood glucose. Continue to have your blood tested as directed by your health care provider.  What should I know about cancer screening? There are several types of cancer. Take the following steps to reduce your risk and to catch any cancer development as early as possible. Breast Cancer  Practice breast self-awareness. ? This means understanding how your breasts normally appear and feel. ? It also means doing regular breast self-exams. Let your health care provider know about any changes, no matter how small.  If you are 40 or older, have a clinician do a breast exam (clinical breast exam or CBE) every year. Depending on your age, family history, and medical history, it may be recommended that you also have a yearly breast X-ray (mammogram).  If you have a family history of breast cancer, talk with your health care provider about genetic screening.  If you are at high risk for breast cancer, talk with your health care provider about having an MRI and a mammogram every year.  Breast cancer (BRCA) gene test is recommended for women who have family members with BRCA-related cancers. Results of the assessment will determine the need for genetic counseling and BRCA1 and for BRCA2 testing. BRCA-related cancers include these types: ? Breast. This occurs in males or females. ? Ovarian. ? Tubal. This may also be called fallopian tube cancer. ? Cancer of the abdominal or pelvic lining (peritoneal cancer). ? Prostate. ? Pancreatic.  Cervical, Uterine, and Ovarian Cancer Your health care provider may recommend that you be screened regularly for cancer of the pelvic organs. These include your ovaries, uterus, and vagina. This screening involves a pelvic exam, which  includes checking for microscopic changes to the surface of your cervix (Pap test).  For women ages 21-65, health care providers may recommend a pelvic exam and a Pap test every three years. For women ages 30-65, they may recommend the Pap test and pelvic exam, combined with testing for human papilloma virus (HPV), every five years. Some types of HPV increase your risk of cervical cancer. Testing for HPV may also be done on women of any age who have unclear Pap test results.  Other health care providers may not recommend any screening for nonpregnant women who are considered low risk for pelvic cancer and have no symptoms. Ask your health care provider if a screening pelvic exam is right for you.  If you have had past treatment for cervical cancer or a condition that could lead to cancer, you need Pap tests and screening for cancer for at least 20 years after your treatment. If Pap tests have been discontinued for you, your risk factors (such as having a new sexual partner) need to be reassessed to determine if you should start having screenings again. Some women have medical problems that increase the chance of getting cervical cancer. In these cases, your health care provider may recommend that you have screening and Pap tests more often.    If you have a family history of uterine cancer or ovarian cancer, talk with your health care provider about genetic screening.  If you have vaginal bleeding after reaching menopause, tell your health care provider.  There are currently no reliable tests available to screen for ovarian cancer.  Lung Cancer Lung cancer screening is recommended for adults 55-80 years old who are at high risk for lung cancer because of a history of smoking. A yearly low-dose CT scan of the lungs is recommended if you:  Currently smoke.  Have a history of at least 30 pack-years of smoking and you currently smoke or have quit within the past 15 years. A pack-year is smoking an  average of one pack of cigarettes per day for one year.  Yearly screening should:  Continue until it has been 15 years since you quit.  Stop if you develop a health problem that would prevent you from having lung cancer treatment.  Colorectal Cancer  This type of cancer can be detected and can often be prevented.  Routine colorectal cancer screening usually begins at age 50 and continues through age 75.  If you have risk factors for colon cancer, your health care provider may recommend that you be screened at an earlier age.  If you have a family history of colorectal cancer, talk with your health care provider about genetic screening.  Your health care provider may also recommend using home test kits to check for hidden blood in your stool.  A small camera at the end of a tube can be used to examine your colon directly (sigmoidoscopy or colonoscopy). This is done to check for the earliest forms of colorectal cancer.  Direct examination of the colon should be repeated every 5-10 years until age 75. However, if early forms of precancerous polyps or small growths are found or if you have a family history or genetic risk for colorectal cancer, you may need to be screened more often.  Skin Cancer  Check your skin from head to toe regularly.  Monitor any moles. Be sure to tell your health care provider: ? About any new moles or changes in moles, especially if there is a change in a mole's shape or color. ? If you have a mole that is larger than the size of a pencil eraser.  If any of your family members has a history of skin cancer, especially at a young age, talk with your health care provider about genetic screening.  Always use sunscreen. Apply sunscreen liberally and repeatedly throughout the day.  Whenever you are outside, protect yourself by wearing long sleeves, pants, a wide-brimmed hat, and sunglasses.  What should I know about osteoporosis? Osteoporosis is a condition in  which bone destruction happens more quickly than new bone creation. After menopause, you may be at an increased risk for osteoporosis. To help prevent osteoporosis or the bone fractures that can happen because of osteoporosis, the following is recommended:  If you are 19-50 years old, get at least 1,000 mg of calcium and at least 600 mg of vitamin D per day.  If you are older than age 50 but younger than age 70, get at least 1,200 mg of calcium and at least 600 mg of vitamin D per day.  If you are older than age 70, get at least 1,200 mg of calcium and at least 800 mg of vitamin D per day.  Smoking and excessive alcohol intake increase the risk of osteoporosis. Eat foods that are rich   in calcium and vitamin D, and do weight-bearing exercises several times each week as directed by your health care provider. What should I know about how menopause affects my mental health? Depression may occur at any age, but it is more common as you become older. Common symptoms of depression include:  Low or sad mood.  Changes in sleep patterns.  Changes in appetite or eating patterns.  Feeling an overall lack of motivation or enjoyment of activities that you previously enjoyed.  Frequent crying spells.  Talk with your health care provider if you think that you are experiencing depression. What should I know about immunizations? It is important that you get and maintain your immunizations. These include:  Tetanus, diphtheria, and pertussis (Tdap) booster vaccine.  Influenza every year before the flu season begins.  Pneumonia vaccine.  Shingles vaccine.  Your health care provider may also recommend other immunizations. This information is not intended to replace advice given to you by your health care provider. Make sure you discuss any questions you have with your health care provider. Document Released: 04/22/2005 Document Revised: 09/18/2015 Document Reviewed: 12/02/2014 Elsevier Interactive  Patient Education  2018 [employer].

## 2017-06-19 ENCOUNTER — Ambulatory Visit (HOSPITAL_BASED_OUTPATIENT_CLINIC_OR_DEPARTMENT_OTHER)
Admission: RE | Admit: 2017-06-19 | Discharge: 2017-06-19 | Disposition: A | Discharge status: Home / Self Care | Payer: PPO | Source: Ambulatory Visit | Attending: Internal Medicine | Admitting: Internal Medicine

## 2017-06-19 DIAGNOSIS — Z780 Asymptomatic menopausal state: Secondary | ICD-10-CM | POA: Diagnosis not present

## 2017-06-19 DIAGNOSIS — M85852 Other specified disorders of bone density and structure, left thigh: Secondary | ICD-10-CM | POA: Insufficient documentation

## 2017-06-22 ENCOUNTER — Ambulatory Visit (INDEPENDENT_AMBULATORY_CARE_PROVIDER_SITE_OTHER): Payer: PPO

## 2017-06-22 ENCOUNTER — Ambulatory Visit: Payer: PPO | Admitting: Podiatry

## 2017-06-22 ENCOUNTER — Encounter: Payer: Self-pay | Admitting: Podiatry

## 2017-06-22 DIAGNOSIS — G5792 Unspecified mononeuropathy of left lower limb: Secondary | ICD-10-CM

## 2017-06-22 DIAGNOSIS — M7672 Peroneal tendinitis, left leg: Secondary | ICD-10-CM

## 2017-06-22 DIAGNOSIS — M779 Enthesopathy, unspecified: Secondary | ICD-10-CM | POA: Diagnosis not present

## 2017-06-22 DIAGNOSIS — M2010 Hallux valgus (acquired), unspecified foot: Secondary | ICD-10-CM

## 2017-06-24 NOTE — Progress Notes (Signed)
Subjective:  Patient ID: Rebecca [name], female    DOB: Mar 01, 1941,  MRN: [id number] HPI Chief Complaint  Patient presents with  . Foot Pain    Lateral ankle left - aching, weakness, feels like its "catching", swelling, no injury, tried stretching and ice-no help  . Foot Pain    Questions regarding bunions   . New Patient (Initial Visit)    77 y.o. female presents with the above complaint.   ROS: Denies fever chills nausea vomiting muscle aches and pains.    Past Medical History:  Diagnosis Date  . Anxiety   . Arthritis    degenerative lumbar spine   . Chronic neck pain   . Complication of anesthesia    at one time my blood pressure dropped -lumbar - on bp med at that time  . Depression   . Dysphagia    esophageal stretched 9-14   . History of blood transfusion 2009   post - lumbar fusion   . Hyperlipidemia   . Hypertension    No longer takes med.  . Osteoarthritis of left shoulder region    Mild, Dr. Mardelle Matte  . Osteoporosis    Past Surgical History:  Procedure Laterality Date  . ABDOMINAL HYSTERECTOMY    . ANTERIOR CERVICAL DECOMP/DISCECTOMY FUSION N/A 11/20/2014   Procedure: ANTERIOR CERVICAL DECOMPRESSION/DISCECTOMY FUSION 2 LEVELS;  Surgeon: Phylliss Bob, MD;  Location: [place];  Service: Orthopedics;  Laterality: N/A;  Anterior cervical decompression fusion, cervical 3-4, cervical 4-5 with instrumentation and allograft  . arm surgery Bilateral    transposed ulner nerve 10 (dr. Amedeo Plenty)  . BACK SURGERY  2009   Dr Jeralyn Bennett SURGERY  [phone]   Dr Saintclair Halsted  . carpal tunnell  2006   bilateral  . CHOLECYSTECTOMY    . COLONOSCOPY    . KNEE SURGERY  9-11   right,  scope; left knee  . NECK SURGERY  05/2008   Dr Saintclair Halsted  . OOPHORECTOMY Bilateral   . PARTIAL KNEE ARTHROPLASTY  01/17/2012   LEFT KNEE  . PARTIAL KNEE ARTHROPLASTY  01/17/2012   Procedure: UNICOMPARTMENTAL KNEE;  Surgeon: Johnny Bridge, MD;  Location: [place];  Service: Orthopedics;  Laterality: Left;  .  RHINOPLASTY  1980  . SHOULDER SURGERY Bilateral    x 2/Dr. Collier Salina  . TONSILLECTOMY      Current Outpatient Medications:  .  aspirin 81 MG tablet, Take 81 mg by mouth daily., Disp: , Rfl:  .  Calcium-Magnesium-Vitamin D (CALCIUM 1200+D3 PO), Take by mouth., Disp: , Rfl:  .  clonazePAM (KLONOPIN) 0.5 MG tablet, Take 1 tablet (0.5 mg total) by mouth 3 (three) times daily as needed for anxiety., Disp: 270 tablet, Rfl: 0 .  escitalopram (LEXAPRO) 10 MG tablet, Take 1 tablet (10 mg total) by mouth daily., Disp: 90 tablet, Rfl: 1 .  Multiple Vitamin (MULTIVITAMIN) tablet, Take 1 tablet by mouth daily. Reported on 08/25/2015, Disp: , Rfl:  .  simvastatin (ZOCOR) 40 MG tablet, Take 1 tablet (40 mg total) by mouth daily., Disp: 90 tablet, Rfl: 1  Allergies  Allergen Reactions  . Tape Other (See Comments)    White adhesive tape -blisters  . Alendronate Sodium Other (See Comments)    REACTION: jaw pain  . Cortizone-10 [Hydrocortisone] Other (See Comments)    Pt received cortizone shot, and became very flushed, and hot, blisters, raw areas around lips & ears   . Oxycodone-Acetaminophen Hives  . Penicillins Swelling and Rash   Review of  Systems Objective:  There were no vitals filed for this visit.  General: Well developed, nourished, in no acute distress, alert and oriented x3   Dermatological: Skin is warm, dry and supple bilateral. Nails x 10 are well maintained; remaining integument appears unremarkable at this time. There are no open sores, no preulcerative lesions, no rash or signs of infection present.  Vascular: Dorsalis Pedis artery and Posterior Tibial artery pedal pulses are 2/4 bilateral with immedate capillary fill time. Pedal hair growth present. No varicosities and no lower extremity edema present bilateral.   Neruologic: Grossly intact via light touch bilateral. Vibratory intact via tuning fork bilateral. Protective threshold with Semmes Wienstein monofilament intact to all  pedal sites bilateral. Patellar and Achilles deep tendon reflexes 2+ bilateral. No Babinski or clonus noted bilateral.   Musculoskeletal: No gross boney pedal deformities bilateral. No pain, crepitus, or limitation noted with foot and ankle range of motion bilateral. Muscular strength 5/5 in all groups tested bilateral.  Gait: Unassisted, Nonantalgic.    Radiographs:  Radiographs taken today do not demonstrate any major osseous abnormalities no acute findings.  Assessment & Plan:   Assessment: Peroneal tendinitis left.  Mild hallux valgus bilateral.  Plan: Discussed appropriate shoe gear stretching exercises ice therapy as your modifications.  After sterile Betadine skin prep injected dehydrated alcohol as a sclerosing agent to see if this would help alleviate her symptoms.  She cannot have cortisone or prednisone.  Along the peroneal tendons the ankle left.  I will follow-up with her in about 4 weeks to see how she is doing.     Sefora Tietje T. [place], [place]

## 2017-07-20 ENCOUNTER — Encounter: Payer: Self-pay | Admitting: Podiatry

## 2017-07-20 ENCOUNTER — Ambulatory Visit: Payer: PPO | Admitting: Podiatry

## 2017-07-20 DIAGNOSIS — M779 Enthesopathy, unspecified: Secondary | ICD-10-CM | POA: Diagnosis not present

## 2017-07-20 DIAGNOSIS — M778 Other enthesopathies, not elsewhere classified: Secondary | ICD-10-CM

## 2017-07-20 NOTE — Progress Notes (Signed)
She presents today for follow-up of her peroneal tendinitis.  She states that he really feels about the same as she refers to the lateral aspect of her left foot.  States that more of the soreness is in here she points to the sinus tarsi area of the left foot.  Objective: Vital signs are stable alert and oriented x3 she has tenderness on palpation of both medial aspect of the ankle and the lateral aspect of the ankle that she has exquisite pain on palpation of the sinus tarsi of the left foot.  Internal range of motion of the subtalar joint is painful.  Assessment: Peroneal and posterior tibial tendinitis but subtalar joint capsulitis seems to be the worst left.  Plan: At this point since she is allergic to cortisone I performed a sterile Betadine skin prep and injected 2 cc of a 4% dehydrated alcohol and local anesthetic into the subtalar joint.  I will follow-up with her in approximately a month to 6 weeks should she have questions or concerns she will notify us immediately.

## 2017-07-24 ENCOUNTER — Encounter: Payer: Self-pay | Admitting: Internal Medicine

## 2017-07-24 ENCOUNTER — Ambulatory Visit (INDEPENDENT_AMBULATORY_CARE_PROVIDER_SITE_OTHER): Payer: PPO | Admitting: Internal Medicine

## 2017-07-24 VITALS — BP 124/64 | HR 65 | Temp 97.5°F | Resp 14 | Ht 60.0 in | Wt 148.1 lb

## 2017-07-24 DIAGNOSIS — E785 Hyperlipidemia, unspecified: Secondary | ICD-10-CM | POA: Diagnosis not present

## 2017-07-24 DIAGNOSIS — Z0000 Encounter for general adult medical examination without abnormal findings: Secondary | ICD-10-CM

## 2017-07-24 DIAGNOSIS — F329 Major depressive disorder, single episode, unspecified: Secondary | ICD-10-CM | POA: Diagnosis not present

## 2017-07-24 DIAGNOSIS — Z23 Encounter for immunization: Secondary | ICD-10-CM

## 2017-07-24 DIAGNOSIS — F419 Anxiety disorder, unspecified: Secondary | ICD-10-CM

## 2017-07-24 DIAGNOSIS — F32A Depression, unspecified: Secondary | ICD-10-CM

## 2017-07-24 DIAGNOSIS — Z79899 Other long term (current) drug therapy: Secondary | ICD-10-CM

## 2017-07-24 DIAGNOSIS — Z1231 Encounter for screening mammogram for malignant neoplasm of breast: Secondary | ICD-10-CM

## 2017-07-24 LAB — CBC WITH DIFFERENTIAL/PLATELET
BASOS ABS: 0 10*3/uL (ref 0.0–0.1)
Basophils Relative: 0.9 % (ref 0.0–3.0)
Eosinophils Absolute: 0.1 10*3/uL (ref 0.0–0.7)
Eosinophils Relative: 3.3 % (ref 0.0–5.0)
HCT: 38 % (ref 36.0–46.0)
Hemoglobin: 12.9 g/dL (ref 12.0–15.0)
LYMPHS ABS: 1.2 10*3/uL (ref 0.7–4.0)
Lymphocytes Relative: 27.4 % (ref 12.0–46.0)
MCHC: 33.9 g/dL (ref 30.0–36.0)
MCV: 92.1 fl (ref 78.0–100.0)
Monocytes Absolute: 0.3 10*3/uL (ref 0.1–1.0)
Monocytes Relative: 8 % (ref 3.0–12.0)
NEUTROS ABS: 2.6 10*3/uL (ref 1.4–7.7)
Neutrophils Relative %: 60.4 % (ref 43.0–77.0)
Platelets: 128 10*3/uL — ABNORMAL LOW (ref 150.0–400.0)
RBC: 4.12 Mil/uL (ref 3.87–5.11)
RDW: 13.2 % (ref 11.5–15.5)
WBC: 4.2 10*3/uL (ref 4.0–10.5)

## 2017-07-24 LAB — TSH: TSH: 2.41 u[IU]/mL (ref 0.35–4.50)

## 2017-07-24 LAB — COMPREHENSIVE METABOLIC PANEL
ALT: 23 U/L (ref 0–35)
AST: 32 U/L (ref 0–37)
Albumin: 4.1 g/dL (ref 3.5–5.2)
Alkaline Phosphatase: 57 U/L (ref 39–117)
BILIRUBIN TOTAL: 0.6 mg/dL (ref 0.2–1.2)
BUN: 22 mg/dL (ref 6–23)
CALCIUM: 9.5 mg/dL (ref 8.4–10.5)
CHLORIDE: 107 meq/L (ref 96–112)
CO2: 28 meq/L (ref 19–32)
Creatinine, Ser: 0.97 mg/dL (ref 0.40–1.20)
GFR: 59.17 mL/min — AB (ref >60.00)
Glucose, Bld: 96 mg/dL (ref 70–99)
Potassium: 4.6 mEq/L (ref 3.5–5.1)
Sodium: 141 mEq/L (ref 135–145)
Total Protein: 7.2 g/dL (ref 6.0–8.3)

## 2017-07-24 LAB — LIPID PANEL
CHOL/HDL RATIO: 2
Cholesterol: 151 mg/dL (ref 0–200)
HDL: 70.3 mg/dL (ref >39.00)
LDL CALC: 69 mg/dL (ref 0–99)
NonHDL: 80.72
TRIGLYCERIDES: 60 mg/dL (ref 0.0–149.0)
VLDL: 12 mg/dL (ref 0.0–40.0)

## 2017-07-24 NOTE — Progress Notes (Signed)
Subjective:    Patient ID: Rebecca [name], female    DOB: [birth year], 77 y.o.   MRN: [id number]  DOS:  07/24/2017 Type of visit - description : cpx Interval history:  In general feeling well, no major concerns  Review of Systems No unusual symptoms, all at baseline  Other than above, a 14 point review of systems is negative      Past Medical History:  Diagnosis Date  . Anxiety   . Arthritis    degenerative lumbar spine   . Chronic neck pain   . Complication of anesthesia    at one time my blood pressure dropped -lumbar - on bp med at that time  . Depression   . Dysphagia    esophageal stretched 9-14   . History of blood transfusion 2009   post - lumbar fusion   . Hyperlipidemia   . Hypertension    No longer takes med.  . Osteoarthritis of left shoulder region    Mild, Dr. Mardelle Matte  . Osteoporosis     Past Surgical History:  Procedure Laterality Date  . ABDOMINAL HYSTERECTOMY    . ANTERIOR CERVICAL DECOMP/DISCECTOMY FUSION N/A 11/20/2014   Procedure: ANTERIOR CERVICAL DECOMPRESSION/DISCECTOMY FUSION 2 LEVELS;  Surgeon: Phylliss Bob, MD;  Location: [place];  Service: Orthopedics;  Laterality: N/A;  Anterior cervical decompression fusion, cervical 3-4, cervical 4-5 with instrumentation and allograft  . arm surgery Bilateral    transposed ulner nerve 10 (dr. Amedeo Plenty)  . BACK SURGERY  2009   Dr Jeralyn Bennett SURGERY  [phone]   Dr Saintclair Halsted  . carpal tunnell  2006   bilateral  . CHOLECYSTECTOMY    . COLONOSCOPY    . KNEE SURGERY  9-11   right,  scope; left knee  . NECK SURGERY  05/2008   Dr Saintclair Halsted  . OOPHORECTOMY Bilateral   . PARTIAL KNEE ARTHROPLASTY  01/17/2012   LEFT KNEE  . PARTIAL KNEE ARTHROPLASTY  01/17/2012   Procedure: UNICOMPARTMENTAL KNEE;  Surgeon: Johnny Bridge, MD;  Location: [place];  Service: Orthopedics;  Laterality: Left;  . RHINOPLASTY  1980  . SHOULDER SURGERY Bilateral    x 2/Dr. Collier Salina  . TONSILLECTOMY     Family History  Problem Relation  Age of Onset  . Prostate cancer Father   . Diabetes Sister   . Heart disease Sister        sister and brothers x 2  . Kidney disease Sister   . Kidney disease Brother   . Heart disease Brother   . Stomach cancer Maternal Aunt   . Colon cancer Neg Hx   . Breast cancer Neg Hx     Social History   Socioeconomic History  . Marital status: Married    Spouse name: [name].  . Number of children: 4  . Years of education: Not on file  . Highest education level: Not on file  Occupational History  . Occupation: stay home   Social Needs  . Financial resource strain: Not on file  . Food insecurity:    Worry: Not on file    Inability: Not on file  . Transportation needs:    Medical: Not on file    Non-medical: Not on file  Tobacco Use  . Smoking status: Never Smoker  . Smokeless tobacco: Never Used  Substance and Sexual Activity  . Alcohol use: Yes    Alcohol/week: 0.0 oz    Comment: rare  . Drug use: No  .  Sexual activity: Not Currently  Lifestyle  . Physical activity:    Days per week: Not on file    Minutes per session: Not on file  . Stress: Not on file  Relationships  . Social connections:    Talks on phone: Not on file    Gets together: Not on file    Attends religious service: Not on file    Active member of club or organization: Not on file    Attends meetings of clubs or organizations: Not on file    Relationship status: Not on file  . Intimate partner violence:    Fear of current or ex partner: Not on file    Emotionally abused: Not on file    Physically abused: Not on file    Forced sexual activity: Not on file  Other Topics Concern  . Not on file  Social History Narrative   Husband has cancer    1 w/ autism and cerebral palsy lives w/ them      Allergies as of 07/24/2017      Reactions   Tape Other (See Comments)   White adhesive tape -blisters   Alendronate Sodium Other (See Comments)   REACTION: jaw pain   Cortizone-10 [hydrocortisone] Other  (See Comments)   Pt received cortizone shot, and became very flushed, and hot, blisters, raw areas around lips & ears    Oxycodone-acetaminophen Hives   Penicillins Swelling, Rash      Medication List        Accurate as of 07/24/17  8:43 PM. Always use your most recent med list.          aspirin 81 MG tablet Take 81 mg by mouth daily.   CALCIUM 1200+D3 PO Take by mouth.   clonazePAM 0.5 MG tablet Commonly known as:  KLONOPIN Take 1 tablet (0.5 mg total) by mouth 3 (three) times daily as needed for anxiety.   escitalopram 10 MG tablet Commonly known as:  LEXAPRO Take 1 tablet (10 mg total) by mouth daily.   multivitamin tablet Take 1 tablet by mouth daily. Reported on 08/25/2015   simvastatin 40 MG tablet Commonly known as:  ZOCOR Take 1 tablet (40 mg total) by mouth daily.          Objective:   Physical Exam BP 124/64 (BP Location: Left Arm, Patient Position: Sitting, Cuff Size: Small)   Pulse 65   Temp (!) 97.5 F (36.4 C) (Oral)   Resp 14   Ht 5' (1.524 m)   Wt 148 lb 2 oz (67.2 kg)   SpO2 97%   BMI 28.93 kg/m  General:   Well developed, well nourished . NAD.  Neck: No  thyromegaly  HEENT:  Normocephalic . Face symmetric, atraumatic Lungs:  CTA B Normal respiratory effort, no intercostal retractions, no accessory muscle use. Heart: RRR,  no murmur.  No pretibial edema bilaterally  Abdomen:  Not distended, soft, non-tender. No rebound or rigidity.  There is a palpable aorta.  No pain. Skin: Exposed areas without rash. Not pale. Not jaundice Neurologic:  alert & oriented X3.  Speech normal, gait appropriate for age and unassisted Strength symmetric and appropriate for age.  Psych: Cognition and judgment appear intact.  Cooperative with normal attention span and concentration.  Behavior appropriate. No anxious or depressed appearing.     Assessment & Plan:   Assessment  HTN -- on no med since ~ 2009 Hyperlipidemia Anxiety depression: onset  Fall 2016 d/t husband health, lost sister 03-2015.  rx lexapro 02-2015 GI --GERD --Esophageal stricture stretched 2014  --EGD/2017--->  gastritis, bx---reactive gastropathy, no H. pylori MSK: --DJD; Chronic neck pain ;  spinal stenosis;  multiple surgeries Osteoporosis DEXA osteoprosis 2006 (per pt) DEXA 11-08 normal  DEXA 9-12 mild osteopenia, rec exercise, ca and vit D T score 05-2015:  (-) 1.4 T score 06/19/2017:  (-) 2.1, RX  vit d, exercise , recheck in 2 years Ao [place] 03-2015 - no AAA  PLAN: High cholesterol: On simvastatin, checking labs  anxiety, depression: On Lexapro and clonazepam, currently controlled, UDS and contract today although she takes clonazepam as needed only.   Other problems: Seems stable. RTC 1 year

## 2017-07-24 NOTE — Progress Notes (Signed)
Pre visit review using our clinic review tool, if applicable. No additional management support is needed unless otherwise documented below in the visit note. 

## 2017-07-24 NOTE — Assessment & Plan Note (Addendum)
---  Td:  05-2016; pneumonia shot 2002, 2008, 07/2017; prevnar 2015; shingles shot-- 12-2009; shingrix not available  ---CCS:   normal  Cscope  2003, Cscope 2-14 nopolyps ---PAPs no longer indicated  ---MMG 07-2013 neg , (-) FH, never had an abnormal MMG. D/W pt  further screening, she agreed to have a mammogram.  We will schedule -- (+) FH CAD, pt asx, on statins an ASA ---Palpable aorta: [place] Ao 03-2015 wnl --Diet and exercise discussed

## 2017-07-24 NOTE — Patient Instructions (Signed)
GO TO THE LAB : Get the blood work     GO TO THE FRONT DESK Schedule your next appointment for a physical exam in 1 year   Schedule a Medicare wellness exam with Glenard Haring, one of our nurses

## 2017-07-24 NOTE — Assessment & Plan Note (Signed)
High cholesterol: On simvastatin, checking labs  anxiety, depression: On Lexapro and clonazepam, currently controlled, UDS and contract today although she takes clonazepam as needed only.   Other problems: Seems stable. RTC 1 year

## 2017-07-25 ENCOUNTER — Other Ambulatory Visit: Payer: Self-pay | Admitting: Internal Medicine

## 2017-07-25 DIAGNOSIS — Z1231 Encounter for screening mammogram for malignant neoplasm of breast: Secondary | ICD-10-CM

## 2017-07-27 ENCOUNTER — Ambulatory Visit
Admission: RE | Admit: 2017-07-27 | Discharge: 2017-07-27 | Disposition: A | Discharge status: Home / Self Care | Payer: PPO | Source: Ambulatory Visit | Attending: Internal Medicine | Admitting: Internal Medicine

## 2017-07-27 DIAGNOSIS — Z1231 Encounter for screening mammogram for malignant neoplasm of breast: Secondary | ICD-10-CM | POA: Diagnosis not present

## 2017-07-28 LAB — PAIN MGMT, PROFILE 8 W/CONF, U
6 Acetylmorphine: NEGATIVE ng/mL (ref <10)
ALPHAHYDROXYALPRAZOLAM: NEGATIVE ng/mL (ref <25)
AMINOCLONAZEPAM: 130 ng/mL — AB (ref <25)
AMPHETAMINES: NEGATIVE ng/mL (ref <500)
Alcohol Metabolites: POSITIVE ng/mL — AB (ref <500)
Alphahydroxymidazolam: NEGATIVE ng/mL (ref <50)
Alphahydroxytriazolam: NEGATIVE ng/mL (ref <50)
BUPRENORPHINE, URINE: NEGATIVE ng/mL (ref <5)
Benzodiazepines: POSITIVE ng/mL — AB (ref <100)
Cocaine Metabolite: NEGATIVE ng/mL (ref <150)
Creatinine: 85.1 mg/dL
ETHYL SULFATE (ETS): 4238 ng/mL — AB (ref <100)
Ethyl Glucuronide (ETG): 17047 ng/mL — ABNORMAL HIGH (ref <500)
HYDROXYETHYLFLURAZEPAM: NEGATIVE ng/mL (ref <50)
Lorazepam: NEGATIVE ng/mL (ref <50)
MDMA: NEGATIVE ng/mL (ref <500)
Marijuana Metabolite: NEGATIVE ng/mL (ref <20)
Nordiazepam: NEGATIVE ng/mL (ref <50)
OXAZEPAM: NEGATIVE ng/mL (ref <50)
OXIDANT: NEGATIVE ug/mL (ref <200)
Opiates: NEGATIVE ng/mL (ref <100)
Oxycodone: NEGATIVE ng/mL (ref <100)
PH: 6.9 (ref 4.5–9.0)
TEMAZEPAM: NEGATIVE ng/mL (ref <50)

## 2017-08-12 ENCOUNTER — Telehealth: Payer: Self-pay | Admitting: Internal Medicine

## 2017-08-14 NOTE — Telephone Encounter (Signed)
Pt is requesting refill on clonazepam.   Last OV: 07/24/2017 Last Fill: 02/23/2017 #270 and 0rf (Pt sig: 1 tab tid prn) UDS: 07/24/2017 Low risk  NCCR printed- no discrepancies noted- sent for scanning.   Please advise.

## 2017-08-14 NOTE — Telephone Encounter (Signed)
Sent!

## 2017-08-22 ENCOUNTER — Encounter: Payer: Self-pay | Admitting: Podiatry

## 2017-08-22 ENCOUNTER — Ambulatory Visit (INDEPENDENT_AMBULATORY_CARE_PROVIDER_SITE_OTHER): Payer: PPO | Admitting: Podiatry

## 2017-08-22 ENCOUNTER — Other Ambulatory Visit: Payer: Self-pay

## 2017-08-22 DIAGNOSIS — G5792 Unspecified mononeuropathy of left lower limb: Secondary | ICD-10-CM

## 2017-08-22 NOTE — Progress Notes (Signed)
Presents today for follow-up status doing much better since the last visit not much pain but little pain in the back and underneath the edge of this bone as she points to the fibular malleolus.  Objective: Vital signs are stable alert and oriented x3.  Pulses are palpable.  She still has some pain on palpation of the sinus tarsi.  Sinus tarsitis neuritis.  Plan: After sterile Betadine skin prep I injected 2 cc of 4% dehydrated alcohol with local anesthetic.  Tolerated procedure well.  Follow-up with her in 1 month if necessary.  May need to inject the sural area next time.

## 2017-09-28 ENCOUNTER — Ambulatory Visit: Payer: PPO | Admitting: Podiatry

## 2017-09-28 ENCOUNTER — Encounter: Payer: Self-pay | Admitting: Podiatry

## 2017-09-28 DIAGNOSIS — M722 Plantar fascial fibromatosis: Secondary | ICD-10-CM | POA: Diagnosis not present

## 2017-09-28 DIAGNOSIS — M778 Other enthesopathies, not elsewhere classified: Secondary | ICD-10-CM

## 2017-09-28 DIAGNOSIS — M779 Enthesopathy, unspecified: Secondary | ICD-10-CM

## 2017-09-28 NOTE — Progress Notes (Signed)
She presents today for follow-up of neuritis to her left ankle.  She states that the shot would put in sinus tarsi is doing much better however I am starting to have pain in here as she points to the lateral perineal area as well as the plantar fascial area of the left foot.  She is also having tenderness around the first metatarsophalangeal joint of the right foot associating that with possible bunion deformity as well as bad shoe gear.  Objective: Vital signs are stable she is alert and oriented x3.  Pulses are palpable.  Neurologic sensorium is intact she has much decrease of bone pain on palpation of the sinus tarsi of the left foot though peroneal tendons are still somewhat tender.  She has pain on palpation medial calcaneal tubercle of the left heel exquisitely tender.  No pain on medial and lateral compression of the left heel however.  Mild bunion deformity of the right foot is tender as the medial dorsal cutaneous nerve over lapse the hypertrophic medial condyle of the first metatarsal head.  There are no open lesions or wounds.  Assessment: Capsulitis neuritis of the first metatarsophalangeal joint of the right foot plantar fasciitis of the left foot.  Plan: Discussed etiology pathology conservative surgical therapies placed her in L 1902 or a plantar fascial brace.  Also injected 2 cc of 4% dehydrated alcohol mixed with local anesthetic to her left heel.  This is in the loop of cortisone since she states that she had a reaction to cortisone one time.  It also seems that the dehydrated alcohol work for the sinus tarsi last visit.  Similarly after sterile Betadine skin prep I injected a similar amount to the first metatarsophalangeal joint at the point of maximal tenderness.  I will follow-up with her in 1 month.  Should she have questions or concerns she will notify me immediately.

## 2017-11-09 ENCOUNTER — Encounter: Payer: Self-pay | Admitting: Podiatry

## 2017-11-09 ENCOUNTER — Ambulatory Visit (INDEPENDENT_AMBULATORY_CARE_PROVIDER_SITE_OTHER): Payer: PPO | Admitting: Podiatry

## 2017-11-09 ENCOUNTER — Telehealth: Payer: Self-pay | Admitting: *Deleted

## 2017-11-09 DIAGNOSIS — D3132 Benign neoplasm of left choroid: Secondary | ICD-10-CM | POA: Diagnosis not present

## 2017-11-09 DIAGNOSIS — H04123 Dry eye syndrome of bilateral lacrimal glands: Secondary | ICD-10-CM | POA: Diagnosis not present

## 2017-11-09 DIAGNOSIS — H31091 Other chorioretinal scars, right eye: Secondary | ICD-10-CM | POA: Diagnosis not present

## 2017-11-09 DIAGNOSIS — M722 Plantar fascial fibromatosis: Secondary | ICD-10-CM

## 2017-11-09 DIAGNOSIS — H43813 Vitreous degeneration, bilateral: Secondary | ICD-10-CM | POA: Diagnosis not present

## 2017-11-09 DIAGNOSIS — H25812 Combined forms of age-related cataract, left eye: Secondary | ICD-10-CM | POA: Diagnosis not present

## 2017-11-09 DIAGNOSIS — H25811 Combined forms of age-related cataract, right eye: Secondary | ICD-10-CM | POA: Diagnosis not present

## 2017-11-09 NOTE — Telephone Encounter (Signed)
-----   Message from Rip Harbour, [facility] sent at 11/09/2017 10:40 AM EDT ----- Regarding: PT PT referral - [facility]   Evaluate and treat plantar fasciitis left  Duration: 3 x weeks x 4 weeks

## 2017-11-09 NOTE — Progress Notes (Signed)
Presents today for follow-up of plantar fasciitis left and capsulitis right.  The right foot is doing fine the left foot is better but he feels weak and it hurts more on the ankle and the lateral side of the foot.  Objective: Vital signs are stable alert and oriented x3.  Pulses are palpable.  Neurologic sensorium is intact degenerative flexors are intact muscle strength is normal symmetrical.  She has pain on palpation medial calcaneal tubercle of the left heel.  Some tenderness on palpation of the lateral aspect of the foot and on palpation of the fourth fifth tarsometatarsal joint.  Assessment: Compensatory capsulitis fourth fifth tarsometatarsal joint and plantar fasciitis left.  Plan: After sterile Betadine skin prep I injected 4 mg of dexamethasone and local anesthetic to the point of maximal tenderness medial aspect of the left foot.  Tolerated procedure well without complications.  We will send her to physical therapy.

## 2017-11-09 NOTE — Telephone Encounter (Signed)
Faxed orders to [facility].

## 2017-11-15 DIAGNOSIS — M25672 Stiffness of left ankle, not elsewhere classified: Secondary | ICD-10-CM | POA: Diagnosis not present

## 2017-11-15 DIAGNOSIS — M25572 Pain in left ankle and joints of left foot: Secondary | ICD-10-CM | POA: Diagnosis not present

## 2017-11-15 DIAGNOSIS — R269 Unspecified abnormalities of gait and mobility: Secondary | ICD-10-CM | POA: Diagnosis not present

## 2017-11-17 DIAGNOSIS — R269 Unspecified abnormalities of gait and mobility: Secondary | ICD-10-CM | POA: Diagnosis not present

## 2017-11-17 DIAGNOSIS — M25572 Pain in left ankle and joints of left foot: Secondary | ICD-10-CM | POA: Diagnosis not present

## 2017-11-17 DIAGNOSIS — M6281 Muscle weakness (generalized): Secondary | ICD-10-CM | POA: Diagnosis not present

## 2017-11-17 DIAGNOSIS — M25672 Stiffness of left ankle, not elsewhere classified: Secondary | ICD-10-CM | POA: Diagnosis not present

## 2017-11-21 DIAGNOSIS — R269 Unspecified abnormalities of gait and mobility: Secondary | ICD-10-CM | POA: Diagnosis not present

## 2017-11-21 DIAGNOSIS — M25572 Pain in left ankle and joints of left foot: Secondary | ICD-10-CM | POA: Diagnosis not present

## 2017-11-21 DIAGNOSIS — M25672 Stiffness of left ankle, not elsewhere classified: Secondary | ICD-10-CM | POA: Diagnosis not present

## 2017-11-21 DIAGNOSIS — M6281 Muscle weakness (generalized): Secondary | ICD-10-CM | POA: Diagnosis not present

## 2017-11-24 DIAGNOSIS — R269 Unspecified abnormalities of gait and mobility: Secondary | ICD-10-CM | POA: Diagnosis not present

## 2017-11-24 DIAGNOSIS — M25572 Pain in left ankle and joints of left foot: Secondary | ICD-10-CM | POA: Diagnosis not present

## 2017-11-24 DIAGNOSIS — M6281 Muscle weakness (generalized): Secondary | ICD-10-CM | POA: Diagnosis not present

## 2017-11-24 DIAGNOSIS — M25672 Stiffness of left ankle, not elsewhere classified: Secondary | ICD-10-CM | POA: Diagnosis not present

## 2017-11-28 DIAGNOSIS — R269 Unspecified abnormalities of gait and mobility: Secondary | ICD-10-CM | POA: Diagnosis not present

## 2017-11-28 DIAGNOSIS — M6281 Muscle weakness (generalized): Secondary | ICD-10-CM | POA: Diagnosis not present

## 2017-11-28 DIAGNOSIS — M25672 Stiffness of left ankle, not elsewhere classified: Secondary | ICD-10-CM | POA: Diagnosis not present

## 2017-11-28 DIAGNOSIS — M25572 Pain in left ankle and joints of left foot: Secondary | ICD-10-CM | POA: Diagnosis not present

## 2017-11-30 DIAGNOSIS — R269 Unspecified abnormalities of gait and mobility: Secondary | ICD-10-CM | POA: Diagnosis not present

## 2017-11-30 DIAGNOSIS — M6281 Muscle weakness (generalized): Secondary | ICD-10-CM | POA: Diagnosis not present

## 2017-11-30 DIAGNOSIS — M25572 Pain in left ankle and joints of left foot: Secondary | ICD-10-CM | POA: Diagnosis not present

## 2017-11-30 DIAGNOSIS — M25672 Stiffness of left ankle, not elsewhere classified: Secondary | ICD-10-CM | POA: Diagnosis not present

## 2017-12-05 DIAGNOSIS — R269 Unspecified abnormalities of gait and mobility: Secondary | ICD-10-CM | POA: Diagnosis not present

## 2017-12-05 DIAGNOSIS — M6281 Muscle weakness (generalized): Secondary | ICD-10-CM | POA: Diagnosis not present

## 2017-12-05 DIAGNOSIS — M25572 Pain in left ankle and joints of left foot: Secondary | ICD-10-CM | POA: Diagnosis not present

## 2017-12-05 DIAGNOSIS — M25672 Stiffness of left ankle, not elsewhere classified: Secondary | ICD-10-CM | POA: Diagnosis not present

## 2017-12-07 DIAGNOSIS — R269 Unspecified abnormalities of gait and mobility: Secondary | ICD-10-CM | POA: Diagnosis not present

## 2017-12-07 DIAGNOSIS — M25672 Stiffness of left ankle, not elsewhere classified: Secondary | ICD-10-CM | POA: Diagnosis not present

## 2017-12-07 DIAGNOSIS — M6281 Muscle weakness (generalized): Secondary | ICD-10-CM | POA: Diagnosis not present

## 2017-12-07 DIAGNOSIS — M25572 Pain in left ankle and joints of left foot: Secondary | ICD-10-CM | POA: Diagnosis not present

## 2017-12-11 DIAGNOSIS — M25672 Stiffness of left ankle, not elsewhere classified: Secondary | ICD-10-CM | POA: Diagnosis not present

## 2017-12-11 DIAGNOSIS — M25572 Pain in left ankle and joints of left foot: Secondary | ICD-10-CM | POA: Diagnosis not present

## 2017-12-11 DIAGNOSIS — R269 Unspecified abnormalities of gait and mobility: Secondary | ICD-10-CM | POA: Diagnosis not present

## 2017-12-11 DIAGNOSIS — M6281 Muscle weakness (generalized): Secondary | ICD-10-CM | POA: Diagnosis not present

## 2017-12-13 DIAGNOSIS — H52222 Regular astigmatism, left eye: Secondary | ICD-10-CM | POA: Diagnosis not present

## 2017-12-13 DIAGNOSIS — H25812 Combined forms of age-related cataract, left eye: Secondary | ICD-10-CM | POA: Diagnosis not present

## 2017-12-13 DIAGNOSIS — H2512 Age-related nuclear cataract, left eye: Secondary | ICD-10-CM | POA: Diagnosis not present

## 2017-12-24 DIAGNOSIS — M25572 Pain in left ankle and joints of left foot: Secondary | ICD-10-CM | POA: Diagnosis not present

## 2018-01-08 DIAGNOSIS — S92355A Nondisplaced fracture of fifth metatarsal bone, left foot, initial encounter for closed fracture: Secondary | ICD-10-CM | POA: Diagnosis not present

## 2018-01-08 DIAGNOSIS — H2511 Age-related nuclear cataract, right eye: Secondary | ICD-10-CM | POA: Diagnosis not present

## 2018-01-10 DIAGNOSIS — H25811 Combined forms of age-related cataract, right eye: Secondary | ICD-10-CM | POA: Diagnosis not present

## 2018-01-10 DIAGNOSIS — H2511 Age-related nuclear cataract, right eye: Secondary | ICD-10-CM | POA: Diagnosis not present

## 2018-01-10 DIAGNOSIS — H52221 Regular astigmatism, right eye: Secondary | ICD-10-CM | POA: Diagnosis not present

## 2018-01-12 DIAGNOSIS — S92353A Displaced fracture of fifth metatarsal bone, unspecified foot, initial encounter for closed fracture: Secondary | ICD-10-CM

## 2018-01-12 HISTORY — DX: Displaced fracture of fifth metatarsal bone, unspecified foot, initial encounter for closed fracture: S92.353A

## 2018-02-05 DIAGNOSIS — S92355D Nondisplaced fracture of fifth metatarsal bone, left foot, subsequent encounter for fracture with routine healing: Secondary | ICD-10-CM | POA: Diagnosis not present

## 2018-02-12 ENCOUNTER — Telehealth: Payer: Self-pay | Admitting: Internal Medicine

## 2018-02-12 NOTE — Telephone Encounter (Signed)
Pt is requesting refill on clonazepam.   Last OV: 07/24/2017 Last Fill: 08/14/2017 #270 and 0RF Pt sig: 1 tab tid prn UDS: 07/24/2017 Low risk  NCCR printed- no discrepancies noted- sent for scanning

## 2018-02-12 NOTE — Telephone Encounter (Signed)
Sent!

## 2018-02-16 DIAGNOSIS — Z961 Presence of intraocular lens: Secondary | ICD-10-CM | POA: Diagnosis not present

## 2018-03-05 DIAGNOSIS — S92355D Nondisplaced fracture of fifth metatarsal bone, left foot, subsequent encounter for fracture with routine healing: Secondary | ICD-10-CM | POA: Diagnosis not present

## 2018-04-02 DIAGNOSIS — S92355D Nondisplaced fracture of fifth metatarsal bone, left foot, subsequent encounter for fracture with routine healing: Secondary | ICD-10-CM | POA: Diagnosis not present

## 2018-04-26 ENCOUNTER — Encounter: Payer: Self-pay | Admitting: Podiatry

## 2018-04-26 ENCOUNTER — Ambulatory Visit: Payer: PPO | Admitting: Podiatry

## 2018-04-26 DIAGNOSIS — S86312A Strain of muscle(s) and tendon(s) of peroneal muscle group at lower leg level, left leg, initial encounter: Secondary | ICD-10-CM

## 2018-04-26 DIAGNOSIS — M779 Enthesopathy, unspecified: Secondary | ICD-10-CM | POA: Diagnosis not present

## 2018-04-26 DIAGNOSIS — M778 Other enthesopathies, not elsewhere classified: Secondary | ICD-10-CM

## 2018-04-26 NOTE — Progress Notes (Signed)
She presents today still having pain to her left foot and ankle.  States that she ended up breaking her fifth metatarsal and she was in a boot for 8 weeks or so.  She states that she has severe pain in the left ankle and it just seems to be getting worse rather than improving at all.  She is also complaining of pain to the right first metatarsal phalangeal joint and would like to consider surgical intervention.  Objective: I have reviewed her past medical history medications allergies surgery social history.  Pulses are palpable.  She has pain on range of motion palpation of the first metatarsophalangeal joint of the right foot with mild hallux valgus deformity.  There really is no limitation in range of motion.  She also has pain on palpation and range of motion of the subtalar joint left and pain on palpation of the sinus tarsi left.  She also has pain on palpation of the peroneal tendons left as well as the anterior talofibular ligament.  Assessment: Cannot rule out sprain or tear to the anterior lateral ankle also subtalar joint capsulitis osteoarthritis left.  Chronic pain in this area.  Capsulitis first metatarsophalangeal joint of the right foot and bunion deformity.  Plan: Discussed etiology pathology and surgical therapies extra Betadine skin prep injected 10 mg Kenalog 5 mg Marcaine point of maximal tenderness of the right first metatarsal phalangeal joint and we are going to request an MRI to the left ankle.

## 2018-04-27 ENCOUNTER — Telehealth: Payer: Self-pay | Admitting: *Deleted

## 2018-04-27 DIAGNOSIS — M779 Enthesopathy, unspecified: Secondary | ICD-10-CM

## 2018-04-27 DIAGNOSIS — M778 Other enthesopathies, not elsewhere classified: Secondary | ICD-10-CM

## 2018-04-27 DIAGNOSIS — S86312A Strain of muscle(s) and tendon(s) of peroneal muscle group at lower leg level, left leg, initial encounter: Secondary | ICD-10-CM

## 2018-04-27 DIAGNOSIS — G5792 Unspecified mononeuropathy of left lower limb: Secondary | ICD-10-CM

## 2018-04-27 DIAGNOSIS — M722 Plantar fascial fibromatosis: Secondary | ICD-10-CM

## 2018-04-27 NOTE — Telephone Encounter (Signed)
-----   Message from Rip Harbour, [facility] sent at 04/26/2018  5:16 PM EST ----- Regarding: MRI MRI left ankle - evaluate chronic subtalar pain with ankle ligament tear left - surgical consideration

## 2018-04-27 NOTE — Telephone Encounter (Signed)
Faxed orders to [facility]. 

## 2018-05-10 ENCOUNTER — Ambulatory Visit
Admission: RE | Admit: 2018-05-10 | Discharge: 2018-05-10 | Disposition: A | Discharge status: Home / Self Care | Payer: PPO | Source: Ambulatory Visit | Attending: Podiatry | Admitting: Podiatry

## 2018-05-10 DIAGNOSIS — M19072 Primary osteoarthritis, left ankle and foot: Secondary | ICD-10-CM | POA: Diagnosis not present

## 2018-05-14 ENCOUNTER — Telehealth: Payer: Self-pay | Admitting: *Deleted

## 2018-05-14 NOTE — Telephone Encounter (Signed)
-----   Message from Garrel Ridgel, [place] sent at 05/14/2018  7:05 AM EST ----- Please send for an over read and inform patient of the delay.

## 2018-05-14 NOTE — Telephone Encounter (Signed)
I informed pt of Dr Stephenie Acres request to send copy of the MRI disc to a radiology specialist for more details for treatment planning, there would be a 10-14 day delay for the result final and I would call with instructions. Pt states understanding.

## 2018-05-15 ENCOUNTER — Other Ambulatory Visit: Payer: Self-pay | Admitting: Internal Medicine

## 2018-05-15 NOTE — Telephone Encounter (Signed)
Received MRI disc and mailed to [facility].

## 2018-05-24 ENCOUNTER — Ambulatory Visit: Payer: PPO | Admitting: *Deleted

## 2018-06-01 ENCOUNTER — Telehealth: Payer: Self-pay | Admitting: *Deleted

## 2018-06-01 NOTE — Telephone Encounter (Signed)
I spoke with Modena Nunnery and she does not see pt registered in their system, I told Eduard Clos the disc had been sent out on 05/14/2018 if she could put a RUSH on it and she states she would.

## 2018-06-01 NOTE — Telephone Encounter (Signed)
Pt asked if the 2nd reading was available. I reviewed clinicals and no results were available.

## 2018-06-01 NOTE — Progress Notes (Deleted)
Subjective:   Rebecca [name] is a 78 y.o. female who presents for Medicare Annual (Subsequent) preventive examination.  Review of Systems: No ROS.  Medicare Wellness Visit. Additional risk factors are reflected in the social history.   Sleep patterns:  Home Safety/Smoke Alarms: Feels safe in home. Smoke alarms in place.  Lives with husband and son in 2 story home.   Female:         Mammo- 07/31/17  Dexa scan- 06/19/17 CCS- 05/08/12- recall 10 yrs    Objective:     Vitals: There were no vitals taken for this visit.  There is no height or weight on file to calculate BMI.  Advanced Directives 05/22/2017 08/22/2015 05/11/2015 11/18/2014 01/24/2014 01/16/2014 01/17/2012  Does Patient Have a Medical Advance Directive? Yes No Yes Yes Yes Yes Patient has advance directive, copy not in chart  Type of Advance Directive [place];Living will - [place];Living will Living will;Healthcare Power of [place] will [place];Living will  Does patient want to make changes to medical advance directive? No - Patient declined - - No - Patient declined - No - Patient declined -  Copy of [place] in Chart? Yes - No - copy requested No - copy requested - - Copy requested from other (Comment)    Tobacco Social History   Tobacco Use  Smoking Status Never Smoker  Smokeless Tobacco Never Used     Counseling given: Not Answered   Clinical Intake:                       Past Medical History:  Diagnosis Date  . Anxiety   . Arthritis    degenerative lumbar spine   . Chronic neck pain   . Closed fracture of base of fifth metatarsal bone 01/2018   Left  . Complication of anesthesia    at one time my blood pressure dropped -lumbar - on bp med at that time  . Depression   . Dysphagia    esophageal stretched 9-14   . History of blood transfusion 2009   post - lumbar fusion   . Hyperlipidemia   .  Hypertension    No longer takes med.  . Osteoarthritis of left shoulder region    Mild, Dr. Mardelle Matte  . Osteoporosis    Past Surgical History:  Procedure Laterality Date  . ABDOMINAL HYSTERECTOMY    . ANTERIOR CERVICAL DECOMP/DISCECTOMY FUSION N/A 11/20/2014   Procedure: ANTERIOR CERVICAL DECOMPRESSION/DISCECTOMY FUSION 2 LEVELS;  Surgeon: Phylliss Bob, MD;  Location: [facility];  Service: Orthopedics;  Laterality: N/A;  Anterior cervical decompression fusion, cervical 3-4, cervical 4-5 with instrumentation and allograft  . arm surgery Bilateral    transposed ulner nerve 10 (dr. Amedeo Plenty)  . BACK SURGERY  2009   Dr Jeralyn Bennett SURGERY  [id number]   Dr Saintclair Halsted  . carpal tunnell  2006   bilateral  . CHOLECYSTECTOMY    . KNEE SURGERY  9-11   right,  scope; left knee  . NECK SURGERY  05/2008   Dr Saintclair Halsted  . OOPHORECTOMY Bilateral   . PARTIAL KNEE ARTHROPLASTY  01/17/2012   LEFT KNEE  . PARTIAL KNEE ARTHROPLASTY  01/17/2012   Procedure: UNICOMPARTMENTAL KNEE;  Surgeon: Johnny Bridge, MD;  Location: [facility];  Service: Orthopedics;  Laterality: Left;  . RHINOPLASTY  1980  . SHOULDER SURGERY Bilateral    x 2/Dr. Collier Salina  .  TONSILLECTOMY     Family History  Problem Relation Age of Onset  . Prostate cancer Father   . Diabetes Sister   . Heart disease Sister        sister and brothers x 2  . Kidney disease Sister   . Kidney disease Brother   . Heart disease Brother   . Stomach cancer Maternal Aunt   . Colon cancer Neg Hx   . Breast cancer Neg Hx    Social History   Socioeconomic History  . Marital status: Married    Spouse name: Rebecca [name].  . Number of children: 4  . Years of education: Not on file  . Highest education level: Not on file  Occupational History  . Occupation: stay home   Social Needs  . Financial resource strain: Not on file  . Food insecurity:    Worry: Not on file    Inability: Not on file  . Transportation needs:    Medical: Not on file    Non-medical: Not on file   Tobacco Use  . Smoking status: Never Smoker  . Smokeless tobacco: Never Used  Substance and Sexual Activity  . Alcohol use: Yes    Alcohol/week: 0.0 standard drinks    Comment: rare  . Drug use: No  . Sexual activity: Not Currently  Lifestyle  . Physical activity:    Days per week: Not on file    Minutes per session: Not on file  . Stress: Not on file  Relationships  . Social connections:    Talks on phone: Not on file    Gets together: Not on file    Attends religious service: Not on file    Active member of club or organization: Not on file    Attends meetings of clubs or organizations: Not on file    Relationship status: Not on file  Other Topics Concern  . Not on file  Social History Narrative   Husband has cancer    1 w/ autism and cerebral palsy lives w/ them    Outpatient Encounter Medications as of 06/04/2018  Medication Sig  . aspirin 81 MG tablet Take 81 mg by mouth daily.  . Calcium-Magnesium-Vitamin D (CALCIUM 1200+D3 PO) Take by mouth.  . clonazePAM (KLONOPIN) 0.5 MG tablet TAKE 1 TABLET(0.5 MG) BY MOUTH THREE TIMES DAILY AS NEEDED FOR ANXIETY  . escitalopram (LEXAPRO) 10 MG tablet Take 1 tablet (10 mg total) by mouth daily.  Marland Kitchen ketorolac (ACULAR) 0.4 % SOLN INT 1 GTT IN OS QID STARTING 1 DAY B SURGERY  . Multiple Vitamin (MULTIVITAMIN) tablet Take 1 tablet by mouth daily. Reported on 08/25/2015  . ofloxacin (OCUFLOX) 0.3 % ophthalmic solution INT 1 GTT IN OS QID STARTING 1 DAY B SURGERY  . prednisoLONE acetate (PRED FORTE) 1 % ophthalmic suspension SHAKE LQ AND INT 1 GTT IN OS QID AFTER SURGERY  . simvastatin (ZOCOR) 40 MG tablet Take 1 tablet (40 mg total) by mouth at bedtime.   No facility-administered encounter medications on file as of 06/04/2018.     Activities of Daily Living No flowsheet data found.  Patient Care Team: Colon Branch, MD as PCP - Sheran Luz, MD as Consulting Physician (Neurosurgery) Marchia Bond, MD as Consulting Physician  (Orthopedic Surgery) Phylliss Bob, MD as Consulting Physician (Orthopedic Surgery) Constance Haw, MD as Consulting Physician (Cardiology)    Assessment:   This is a routine wellness examination for Rebecca [name]. Physical assessment deferred to PCP.  Exercise Activities  and Dietary recommendations   Diet (meal preparation, eat out, water intake, caffeinated beverages, dairy products, fruits and vegetables): {Desc; diets:16563} Breakfast: Lunch:  Dinner:      Goals    . Increase physical activity     Wants to start back walking and tone her body.         Fall Risk Fall Risk  05/22/2017 05/18/2016 05/11/2015 03/30/2015 02/13/2015  Falls in the past year? No No No No No   Depression Screen PHQ 2/9 Scores 05/22/2017 05/18/2016 05/11/2015 03/30/2015  PHQ - 2 Score 0 0 1 1  PHQ- 9 Score - - - 4     Cognitive Function MMSE - Mini Mental State Exam 05/22/2017 05/11/2015  Orientation to time 5 5  Orientation to Place 5 5  Registration 3 3  Attention/ Calculation 5 5  Recall 3 3  Language- name 2 objects 2 2  Language- repeat 1 1  Language- follow 3 step command 3 3  Language- read & follow direction 1 1  Write a sentence 1 1  Copy design 1 1  Total score 30 30        Immunization History  Administered Date(s) Administered  . Influenza Split 11/25/2011, 11/21/2013  . Influenza Whole 01/16/2007, 02/24/2009, 12/14/2009  . Influenza, High Dose Seasonal PF 12/19/2012, 12/22/2016  . Influenza,inj,Quad PF,6+ Mos 01/01/2018  . Influenza-Unspecified 12/17/2014, 11/24/2015, 12/22/2016  . Pneumococcal Conjugate-13 03/26/2014  . Pneumococcal Polysaccharide-23 01/16/2007, 07/24/2017  . Td 05/18/2016  . Zoster 12/29/2009    Screening Tests Health Maintenance  Topic Date Due  . DEXA SCAN  06/20/2019  . TETANUS/TDAP  05/19/2026  . INFLUENZA VACCINE  Completed  . PNA vac Low Risk Adult  Completed      Plan:   ***   I have personally reviewed and noted the following in the  patient's chart:   . Medical and social history . Use of alcohol, tobacco or illicit drugs  . Current medications and supplements . Functional ability and status . Nutritional status . Physical activity . Advanced directives . List of other physicians . Hospitalizations, surgeries, and ER visits in previous 12 months . Vitals . Screenings to include cognitive, depression, and falls . Referrals and appointments  In addition, I have reviewed and discussed with patient certain preventive protocols, quality metrics, and best practice recommendations. A written personalized care plan for preventive services as well as general preventive health recommendations were provided to patient.     Shela Nevin, [place]  06/01/2018

## 2018-06-04 ENCOUNTER — Ambulatory Visit: Payer: PPO | Admitting: *Deleted

## 2018-06-08 ENCOUNTER — Telehealth: Payer: Self-pay | Admitting: Podiatry

## 2018-06-08 NOTE — Telephone Encounter (Signed)
I called Rebecca [name] and she states she is having worsening pain I the left leg. Rebecca [name] denies shortness of breath, or chest pain and is on 81mg  Aspirin. I offered Rebecca [name] an appt Monday and will give the over read to Dr. Jacqualyn Posey at the appt time.

## 2018-06-08 NOTE — Telephone Encounter (Signed)
Patient is following up to see if we have heard anything regarding her MRI. Pt has tried wearing the boot and states she could not stand to wear it.  Please give patient a call.

## 2018-06-08 NOTE — Telephone Encounter (Signed)
Charlie - [facility] states pt's over read has just finished and will be faxed as soon as signed by the doctor.

## 2018-06-11 ENCOUNTER — Other Ambulatory Visit: Payer: Self-pay | Admitting: Podiatry

## 2018-06-11 ENCOUNTER — Ambulatory Visit (INDEPENDENT_AMBULATORY_CARE_PROVIDER_SITE_OTHER): Payer: PPO

## 2018-06-11 ENCOUNTER — Other Ambulatory Visit: Payer: Self-pay

## 2018-06-11 ENCOUNTER — Ambulatory Visit: Payer: PPO | Admitting: Podiatry

## 2018-06-11 ENCOUNTER — Encounter: Payer: Self-pay | Admitting: Podiatry

## 2018-06-11 VITALS — Temp 98.5°F

## 2018-06-11 DIAGNOSIS — S92912A Unspecified fracture of left toe(s), initial encounter for closed fracture: Secondary | ICD-10-CM

## 2018-06-11 DIAGNOSIS — S92352K Displaced fracture of fifth metatarsal bone, left foot, subsequent encounter for fracture with nonunion: Secondary | ICD-10-CM | POA: Diagnosis not present

## 2018-06-11 DIAGNOSIS — M21621 Bunionette of right foot: Secondary | ICD-10-CM

## 2018-06-11 DIAGNOSIS — S99921A Unspecified injury of right foot, initial encounter: Secondary | ICD-10-CM

## 2018-06-11 DIAGNOSIS — M779 Enthesopathy, unspecified: Secondary | ICD-10-CM

## 2018-06-11 DIAGNOSIS — S92301A Fracture of unspecified metatarsal bone(s), right foot, initial encounter for closed fracture: Secondary | ICD-10-CM

## 2018-06-11 MED ORDER — MELOXICAM 7.5 MG PO TABS: 7.5000 mg | ORAL_TABLET | Freq: Every day | ORAL | 0 refills | Status: DC

## 2018-06-12 ENCOUNTER — Encounter: Payer: Self-pay | Admitting: Podiatry

## 2018-06-12 ENCOUNTER — Telehealth: Payer: Self-pay | Admitting: *Deleted

## 2018-06-12 DIAGNOSIS — M21621 Bunionette of right foot: Secondary | ICD-10-CM

## 2018-06-12 DIAGNOSIS — S92301A Fracture of unspecified metatarsal bone(s), right foot, initial encounter for closed fracture: Secondary | ICD-10-CM

## 2018-06-12 DIAGNOSIS — M779 Enthesopathy, unspecified: Secondary | ICD-10-CM

## 2018-06-12 DIAGNOSIS — S92352K Displaced fracture of fifth metatarsal bone, left foot, subsequent encounter for fracture with nonunion: Secondary | ICD-10-CM

## 2018-06-12 NOTE — Telephone Encounter (Signed)
-----   Message from Trula Slade, DPM sent at 06/12/2018  7:48 AM EDT ----- Tivis Ringer- can you please let her know after she left I was thinking about the fracture. I would recommend getting a bone stimulator for that to help healing. Let her know that I am going to order it for her.   I have already contacted [facility] for this.

## 2018-06-12 NOTE — Telephone Encounter (Addendum)
I prepared clinicals, demographics and will fax 06/11/2018 once fracture gap is documented to [facility]. Nicole Kindred.

## 2018-06-12 NOTE — Telephone Encounter (Signed)
I called pt and informed of Dr. Leigh Aurora orders and pt agreed. I explained [facility] would contact her with coverage and fitting and delivery information. Pt states the boot that was given yesterday with instructions from Lattie Haw, has provided more comfort and support to the ankle, than the one she got from [place].

## 2018-06-12 NOTE — Progress Notes (Signed)
Subjective: 78 year old female presents the office today originally for follow-up evaluation of pain to her left foot and to discuss her MRI results and over read.  In regards to her left foot she has had an injury back in October of last year resulted in a fractured sense that she is had discomfort.  She is had injections and she was also in a boot for some time after a fracture.  She still getting tenderness to the ankle.  She also has 2 other concerns.  On Saturday she dropped a bed frame on the top of her right foot.  Since then she is had swelling and bruising to the top of the foot.  She states the whole foot is tender.  Also more of a long-term concern she is having pain she points on the fifth metatarsal head mostly with shoes and pressure.  This is been more chronic issue. Denies any systemic complaints such as fevers, chills, nausea, vomiting. No acute changes since last appointment, and no other complaints at this time.   Objective: AAO x3, NAD DP/PT pulses palpable bilaterally, CRT less than 3 seconds Regards to the left foot and ankle the majority of tenderness does appear to be on the lateral aspect.  She has tenderness on the course the peroneal tendons although the tendons appear to be intact.  Mild discomfort of the fifth metatarsal base.  She has mild tenderness palpation of the sinus tarsi as well as the lateral ankle ligaments.  No significant edema, erythema on the left side.   On the right foot there is edema and ecchymosis to the dorsal aspect of the foot there is diffuse tenderness to the right foot mostly on the metatarsal area.  She subjectively is getting tenderness the fifth metatarsal head with pressure and she is but this is the chronic issue.  No open lesions or pre-ulcerative lesions.  No pain with calf compression, swelling, warmth, erythema  Assessment: Left peroneal tendinitis, ankle sprain, fifth metatarsal base fracture; right foot concern for metatarsal fracture;  right foot tailor's bunion  Plan: -All treatment options discussed with the patient including all alternatives, risks, complications.  -I viewed the MRI as well as the over read results with her.  Also new x-ray of the left foot was obtained and reviewed.  Able to see the old fracture of the fifth metatarsal base.  We discussed the MRI results as well.  It did show a nondisplaced fracture of the fifth metatarsal base but no peroneal tendon tear however there is mild to the notes of the peroneus longus and brevis.  ATFL and CFL sprains and moderate midfoot arthropathy.  She also has chronic plantar fasciitis.  Small osteochondral lesion of the talus.  We discussed the MRI results.  Based on the x-ray findings as well as the MRI results we discussed both conservative and surgical care.  For now to place her into a ankle brace.  Also to try to order a bone stimulator for her for the fifth metatarsal base fracture.  We did discuss surgical intervention for this and all of her issues.  We will discuss with Dr. Milinda Pointer as he has been caring for her. -In regards to the right foot x-rays were obtained reviewed.  There is some radiolucencies present the metatarsals although this is more swelling when to treat for fracture given her global tenderness.  She has a cam boot at home that I want her to wear.  Ice elevation.  She was interested in having  surgery for the tailor's bunion.  We can discuss that later on once the acute issue resolves.  Trula Slade DPM  -Patient encouraged to call the office with any questions, concerns, change in symptoms.

## 2018-06-13 ENCOUNTER — Other Ambulatory Visit: Payer: Self-pay | Admitting: Podiatry

## 2018-06-13 DIAGNOSIS — S92912A Unspecified fracture of left toe(s), initial encounter for closed fracture: Secondary | ICD-10-CM

## 2018-06-18 DIAGNOSIS — I429 Cardiomyopathy, unspecified: Secondary | ICD-10-CM | POA: Diagnosis not present

## 2018-06-18 DIAGNOSIS — S92352K Displaced fracture of fifth metatarsal bone, left foot, subsequent encounter for fracture with nonunion: Secondary | ICD-10-CM | POA: Diagnosis not present

## 2018-07-03 ENCOUNTER — Other Ambulatory Visit: Payer: Self-pay

## 2018-07-03 ENCOUNTER — Encounter: Payer: Self-pay | Admitting: Podiatry

## 2018-07-03 ENCOUNTER — Ambulatory Visit: Payer: PPO | Admitting: Podiatry

## 2018-07-03 ENCOUNTER — Ambulatory Visit (INDEPENDENT_AMBULATORY_CARE_PROVIDER_SITE_OTHER): Payer: PPO

## 2018-07-03 VITALS — Temp 97.9°F

## 2018-07-03 DIAGNOSIS — S92301D Fracture of unspecified metatarsal bone(s), right foot, subsequent encounter for fracture with routine healing: Secondary | ICD-10-CM | POA: Diagnosis not present

## 2018-07-03 DIAGNOSIS — S92352K Displaced fracture of fifth metatarsal bone, left foot, subsequent encounter for fracture with nonunion: Secondary | ICD-10-CM

## 2018-07-03 DIAGNOSIS — G5762 Lesion of plantar nerve, left lower limb: Secondary | ICD-10-CM

## 2018-07-03 DIAGNOSIS — S92301A Fracture of unspecified metatarsal bone(s), right foot, initial encounter for closed fracture: Secondary | ICD-10-CM

## 2018-07-04 ENCOUNTER — Encounter: Payer: Self-pay | Admitting: Podiatry

## 2018-07-04 NOTE — Progress Notes (Signed)
She presents today for follow-up of a fracture bilateral dorsal aspect of the right foot and the fifth metatarsal of the left foot.  She states that the left foot hurts around the little toes up and here as she points to the third and fourth interdigital spaces.  States that the right foot really does not bother her that much.  Continues to use the bone stimulator of the fifth metatarsal base right.  Objective: Vital signs are stable she is alert and oriented x3.  She has pain on palpation with palpable Mulder's click to the third interdigital space of the left foot where her neuroma has been removed approximately 25 years ago.  This seems to be the apex of the pain along the fourth and fifth toes though she does have pain on palpation of the fifth metatarsal base.  Little pain on palpation of the dorsal aspect of the right foot there is still some ecchymosis noted and some swelling still present.  Assessment: Resolving fracture dorsal aspect right foot.  Fracture fifth metatarsal base left foot resolving with bone stimulator.  Neuroma third interdigital space left.  Plan: Continue use of the cam walker right bone stimulator left and I injected the third interdigital space of the left foot with 10 mg of Kenalog we will reevaluate her in 3 to 4 weeks.

## 2018-07-26 ENCOUNTER — Ambulatory Visit: Payer: PPO | Admitting: Internal Medicine

## 2018-08-08 ENCOUNTER — Other Ambulatory Visit: Payer: Self-pay | Admitting: Internal Medicine

## 2018-08-09 ENCOUNTER — Ambulatory Visit (INDEPENDENT_AMBULATORY_CARE_PROVIDER_SITE_OTHER): Payer: PPO | Admitting: Podiatry

## 2018-08-09 ENCOUNTER — Ambulatory Visit (INDEPENDENT_AMBULATORY_CARE_PROVIDER_SITE_OTHER): Payer: PPO

## 2018-08-09 ENCOUNTER — Other Ambulatory Visit: Payer: Self-pay

## 2018-08-09 ENCOUNTER — Encounter: Payer: Self-pay | Admitting: Podiatry

## 2018-08-09 VITALS — Temp 97.7°F

## 2018-08-09 DIAGNOSIS — S92301D Fracture of unspecified metatarsal bone(s), right foot, subsequent encounter for fracture with routine healing: Secondary | ICD-10-CM

## 2018-08-09 DIAGNOSIS — M2011 Hallux valgus (acquired), right foot: Secondary | ICD-10-CM | POA: Diagnosis not present

## 2018-08-09 DIAGNOSIS — M21621 Bunionette of right foot: Secondary | ICD-10-CM | POA: Diagnosis not present

## 2018-08-09 DIAGNOSIS — S92352K Displaced fracture of fifth metatarsal bone, left foot, subsequent encounter for fracture with nonunion: Secondary | ICD-10-CM

## 2018-08-09 DIAGNOSIS — M7752 Other enthesopathy of left foot: Secondary | ICD-10-CM | POA: Diagnosis not present

## 2018-08-09 NOTE — Progress Notes (Signed)
She presents today for follow-up of fracture dorsal aspect right foot and fifth metatarsal of the left foot.  States that is better but is still painful.  She is still using her bone stimulator on the left foot.  He states that my bunion and my fifth metatarsal is still exquisitely painful on the right foot and would like to consider surgical intervention on that.  Objective: Vital signs are stable she is alert and oriented x3.  Pulses are palpable.  Neurologic sensorium is intact deep tendon reflexes are intact muscle strength is normal symmetrical.  Orthopedic evaluation demonstrates hallux valgus deformity of the right foot reducible.  Tailor's bunion deformity reducible right foot.  Left foot demonstrates much decrease in edema though some mild edema overlying the fifth metatarsal base of the left foot none on the right foot other than a mild bruise.  She has tenderness on palpation of the fifth metatarsal phalangeal joint area with underlying bogginess possibly a bursitis.  Assessment: Hallux abductovalgus deformity of the right foot tailor's bunion deformity of the right foot radiographs demonstrate well healing fractures dorsal aspect right foot and Jones fracture fifth left.  Bursitis fifth metatarsal left foot.  Plan: Discussed etiology pathology conservative versus surgical therapies I injected the bursitis with 2 mg of dexamethasone and local anesthetic after sterile Betadine skin prep head of the fifth metatarsal dorsal and lateral aspect left foot.  We discussed the etiology and surgical therapy regarding hallux valgus deformity and tailor's bunion deformities of the right foot consider today for an [facility] bunion repair and 5th metatarsal osteotomy with screw fixation.  We discussed the possible postop complications which may include but are not limited to postop pain bleeding swelling infection recurrence need for further surgery overcorrection under correction loss of digit loss of limb loss  of life.  She understands this is amenable to it  We will do this in the near future.  She will continue use of the bone stimulator to the fifth metatarsal base left foot.

## 2018-08-09 NOTE — Patient Instructions (Signed)
Pre-Operative Instructions  Congratulations, you have decided to take an important step towards improving your quality of life.  You can be assured that the doctors and staff at [facility] will be with you every step of the way.  Here are some important things you should know:  1. Plan to be at the surgery center/hospital at least 1 (one) hour prior to your scheduled time, unless otherwise directed by the surgical center/hospital staff.  You must have a responsible adult accompany you, remain during the surgery and drive you home.  Make sure you have directions to the surgical center/hospital to ensure you arrive on time. 2. If you are having surgery at [facility] or [facility], you will need a copy of your medical history and physical form from your family physician within one month prior to the date of surgery. We will give you a form for your primary physician to complete.  3. We make every effort to accommodate the date you request for surgery.  However, there are times where surgery dates or times have to be moved.  We will contact you as soon as possible if a change in schedule is required.   4. No aspirin/ibuprofen for one week before surgery.  If you are on aspirin, any non-steroidal anti-inflammatory medications (Mobic, Aleve, Ibuprofen) should not be taken seven (7) days prior to your surgery.  You make take Tylenol for pain prior to surgery.  5. Medications - If you are taking daily heart and blood pressure medications, seizure, reflux, allergy, asthma, anxiety, pain or diabetes medications, make sure you notify the surgery center/hospital before the day of surgery so they can tell you which medications you should take or avoid the day of surgery. 6. No food or drink after midnight the night before surgery unless directed otherwise by surgical center/hospital staff. 7. No alcoholic beverages 24-hours prior to surgery.  No smoking 24-hours prior or 24-hours after  surgery. 8. Wear loose pants or shorts. They should be loose enough to fit over bandages, boots, and casts. 9. Don't wear slip-on shoes. Sneakers are preferred. 10. Bring your boot with you to the surgery center/hospital.  Also bring crutches or a walker if your physician has prescribed it for you.  If you do not have this equipment, it will be provided for you after surgery. 11. If you have not been contacted by the surgery center/hospital by the day before your surgery, call to confirm the date and time of your surgery. 12. Leave-time from work may vary depending on the type of surgery you have.  Appropriate arrangements should be made prior to surgery with your employer. 13. Prescriptions will be provided immediately following surgery by your doctor.  Fill these as soon as possible after surgery and take the medication as directed. Pain medications will not be refilled on weekends and must be approved by the doctor. 14. Remove nail polish on the operative foot and avoid getting pedicures prior to surgery. 15. Wash the night before surgery.  The night before surgery wash the foot and leg well with water and the antibacterial soap provided. Be sure to pay special attention to beneath the toenails and in between the toes.  Wash for at least three (3) minutes. Rinse thoroughly with water and dry well with a towel.  Perform this wash unless told not to do so by your physician.  Enclosed: 1 Ice pack (please put in freezer the night before surgery)   1 Hibiclens skin cleaner     Pre-op instructions  If you have any questions regarding the instructions, please do not hesitate to call our office.  [place]: [address], [place], [place] [address] -- [phone]  [place]: [address] [place], [place] [address] -- [phone]  [place]: [address]  [place], [place] [address] -- [phone]  [place]: [address], [address], [place],  [address] -- [phone]  Website:  [email or web address] 

## 2018-08-13 ENCOUNTER — Telehealth: Payer: Self-pay | Admitting: *Deleted

## 2018-08-13 NOTE — Telephone Encounter (Signed)
"  I'd like to schedule my surgery with Dr. Milinda Pointer.  I'd like to do it in July, the reason I want to wait is that my husband gets chemo on Mondays."  Dr. Milinda Pointer can do your surgery on 09/28/2018.  "Okay, I will write it on my calendar."  Someone from the surgical center will call you a day or two prior to your surgery date and that person will give you your arrival time. If you have access to a computer, you need to go online and register with the surgical center via their portal.  "I don't have a computer but I assume they will call me or do I need to call them?"  They will call you.

## 2018-09-03 DIAGNOSIS — L821 Other seborrheic keratosis: Secondary | ICD-10-CM | POA: Diagnosis not present

## 2018-09-06 ENCOUNTER — Other Ambulatory Visit: Payer: Self-pay

## 2018-09-07 ENCOUNTER — Encounter: Payer: Self-pay | Admitting: Internal Medicine

## 2018-09-07 ENCOUNTER — Ambulatory Visit (INDEPENDENT_AMBULATORY_CARE_PROVIDER_SITE_OTHER): Payer: PPO | Admitting: Internal Medicine

## 2018-09-07 VITALS — BP 138/75 | HR 65 | Temp 97.7°F | Resp 16 | Ht 60.0 in | Wt 157.1 lb

## 2018-09-07 DIAGNOSIS — F419 Anxiety disorder, unspecified: Secondary | ICD-10-CM | POA: Diagnosis not present

## 2018-09-07 DIAGNOSIS — Z1231 Encounter for screening mammogram for malignant neoplasm of breast: Secondary | ICD-10-CM

## 2018-09-07 DIAGNOSIS — E785 Hyperlipidemia, unspecified: Secondary | ICD-10-CM | POA: Diagnosis not present

## 2018-09-07 DIAGNOSIS — Z0000 Encounter for general adult medical examination without abnormal findings: Secondary | ICD-10-CM | POA: Diagnosis not present

## 2018-09-07 DIAGNOSIS — F329 Major depressive disorder, single episode, unspecified: Secondary | ICD-10-CM | POA: Diagnosis not present

## 2018-09-07 DIAGNOSIS — H7291 Unspecified perforation of tympanic membrane, right ear: Secondary | ICD-10-CM | POA: Diagnosis not present

## 2018-09-07 DIAGNOSIS — F32A Depression, unspecified: Secondary | ICD-10-CM

## 2018-09-07 DIAGNOSIS — D696 Thrombocytopenia, unspecified: Secondary | ICD-10-CM

## 2018-09-07 LAB — LIPID PANEL
Cholesterol: 133 mg/dL (ref 0–200)
HDL: 62.6 mg/dL (ref >39.00)
LDL Cholesterol: 62 mg/dL (ref 0–99)
NonHDL: 70.72
Total CHOL/HDL Ratio: 2
Triglycerides: 46 mg/dL (ref 0.0–149.0)
VLDL: 9.2 mg/dL (ref 0.0–40.0)

## 2018-09-07 LAB — COMPREHENSIVE METABOLIC PANEL
ALT: 19 U/L (ref 0–35)
AST: 25 U/L (ref 0–37)
Albumin: 3.9 g/dL (ref 3.5–5.2)
Alkaline Phosphatase: 45 U/L (ref 39–117)
BUN: 28 mg/dL — ABNORMAL HIGH (ref 6–23)
CO2: 27 mEq/L (ref 19–32)
Calcium: 8.7 mg/dL (ref 8.4–10.5)
Chloride: 109 mEq/L (ref 96–112)
Creatinine, Ser: 0.99 mg/dL (ref 0.40–1.20)
GFR: 54.21 mL/min — ABNORMAL LOW (ref >60.00)
Glucose, Bld: 84 mg/dL (ref 70–99)
Potassium: 4 mEq/L (ref 3.5–5.1)
Sodium: 143 mEq/L (ref 135–145)
Total Bilirubin: 0.6 mg/dL (ref 0.2–1.2)
Total Protein: 6.1 g/dL (ref 6.0–8.3)

## 2018-09-07 LAB — CBC WITH DIFFERENTIAL/PLATELET
Basophils Absolute: 0 10*3/uL (ref 0.0–0.1)
Basophils Relative: 0.7 % (ref 0.0–3.0)
Eosinophils Absolute: 0.2 10*3/uL (ref 0.0–0.7)
Eosinophils Relative: 5.1 % — ABNORMAL HIGH (ref 0.0–5.0)
HCT: 37.7 % (ref 36.0–46.0)
Hemoglobin: 12.5 g/dL (ref 12.0–15.0)
Lymphocytes Relative: 26.5 % (ref 12.0–46.0)
Lymphs Abs: 1 10*3/uL (ref 0.7–4.0)
MCHC: 33.2 g/dL (ref 30.0–36.0)
MCV: 93 fl (ref 78.0–100.0)
Monocytes Absolute: 0.4 10*3/uL (ref 0.1–1.0)
Monocytes Relative: 10 % (ref 3.0–12.0)
Neutro Abs: 2.1 10*3/uL (ref 1.4–7.7)
Neutrophils Relative %: 57.7 % (ref 43.0–77.0)
Platelets: 103 10*3/uL — ABNORMAL LOW (ref 150.0–400.0)
RBC: 4.05 Mil/uL (ref 3.87–5.11)
RDW: 13.2 % (ref 11.5–15.5)
WBC: 3.6 10*3/uL — ABNORMAL LOW (ref 4.0–10.5)

## 2018-09-07 NOTE — Patient Instructions (Signed)
Get the blood work     [place] Schedule your next appointment   a physical exam in 1 year   Decrease Lexapro 10 mg to half tablet daily  Take over-the-counter Colace (stool softener) to help with constipation.  Let me know if that is not working

## 2018-09-07 NOTE — Assessment & Plan Note (Addendum)
---  Td:  05-2016; pnm 23: 2008, 07/2017; prevnar 2015; zostavax:  12-2009; shingrix discussed , not interested at this point ---CCS:   normal  Cscope  2003, Cscope 2-14 no polyps ---PAPs no longer indicated  ---MMG 07-2013 neg , (-) FH, MMG 07/2017 neg, referral sent -- (+) FH CAD, pt asx, on statins an ASA ---Palpable aorta: [place] Ao 03-2015 wnl --Diet and exercise discussed

## 2018-09-07 NOTE — Progress Notes (Signed)
Pre visit review using our clinic review tool, if applicable. No additional management support is needed unless otherwise documented below in the visit note. 

## 2018-09-07 NOTE — Progress Notes (Signed)
Subjective:    Patient ID: Rebecca [name], female    DOB: 24-Aug-1940, 78 y.o.   MRN: [id number]  DOS:  09/07/2018 Type of visit - description: CPX In general feeling well Had a left fifth metatarsal fracture, symptoms are currently improved. On meloxicam daily without apparent side effects Anxiety well controlled, wonders if she needs Lexapro. Occasional constipation, treatment? Has a discomfort at the right ear for a while, getting slightly worse.  "Like something is in there". No discharge, tinnitus.  Mild HOH.    Review of Systems  Other than above, a 14 point review of systems is negative   Past Medical History:  Diagnosis Date  . Anxiety   . Arthritis    degenerative lumbar spine   . Chronic neck pain   . Closed fracture of base of fifth metatarsal bone 01/2018   Left  . Complication of anesthesia    at one time my blood pressure dropped -lumbar - on bp med at that time  . Depression   . Dysphagia    esophageal stretched 9-14   . History of blood transfusion 2009   post - lumbar fusion   . Hyperlipidemia   . Hypertension    No longer takes med.  . Osteoarthritis of left shoulder region    Mild, Dr. Mardelle Matte  . Osteoporosis     Past Surgical History:  Procedure Laterality Date  . ABDOMINAL HYSTERECTOMY    . ANTERIOR CERVICAL DECOMP/DISCECTOMY FUSION N/A 11/20/2014   Procedure: ANTERIOR CERVICAL DECOMPRESSION/DISCECTOMY FUSION 2 LEVELS;  Surgeon: Phylliss Bob, MD;  Location: [facility];  Service: Orthopedics;  Laterality: N/A;  Anterior cervical decompression fusion, cervical 3-4, cervical 4-5 with instrumentation and allograft  . arm surgery Bilateral    transposed ulner nerve 10 (dr. Amedeo Plenty)  . BACK SURGERY  2009   Dr Jeralyn Bennett SURGERY  [phone]   Dr Saintclair Halsted  . carpal tunnell  2006   bilateral  . CHOLECYSTECTOMY    . KNEE SURGERY  9-11   right,  scope; left knee  . NECK SURGERY  05/2008   Dr Saintclair Halsted  . OOPHORECTOMY Bilateral   . PARTIAL KNEE ARTHROPLASTY   01/17/2012   LEFT KNEE  . PARTIAL KNEE ARTHROPLASTY  01/17/2012   Procedure: UNICOMPARTMENTAL KNEE;  Surgeon: Johnny Bridge, MD;  Location: [facility];  Service: Orthopedics;  Laterality: Left;  . RHINOPLASTY  1980  . SHOULDER SURGERY Bilateral    x 2/Dr. Collier Salina  . TONSILLECTOMY      Social History   Socioeconomic History  . Marital status: Married    Spouse name: [name].  . Number of children: 4  . Years of education: Not on file  . Highest education level: Not on file  Occupational History  . Occupation: stay home   Social Needs  . Financial resource strain: Not on file  . Food insecurity    Worry: Not on file    Inability: Not on file  . Transportation needs    Medical: Not on file    Non-medical: Not on file  Tobacco Use  . Smoking status: Never Smoker  . Smokeless tobacco: Never Used  Substance and Sexual Activity  . Alcohol use: Yes    Alcohol/week: 0.0 standard drinks    Comment: rare  . Drug use: No  . Sexual activity: Not Currently  Lifestyle  . Physical activity    Days per week: Not on file    Minutes per session: Not on file  .  Stress: Not on file  Relationships  . Social Herbalist on phone: Not on file    Gets together: Not on file    Attends religious service: Not on file    Active member of club or organization: Not on file    Attends meetings of clubs or organizations: Not on file    Relationship status: Not on file  . Intimate partner violence    Fear of current or ex partner: Not on file    Emotionally abused: Not on file    Physically abused: Not on file    Forced sexual activity: Not on file  Other Topics Concern  . Not on file  Social History Narrative   Husband has cancer    1 w/ autism and cerebral palsy lives w/ them     Family History  Problem Relation Age of Onset  . Prostate cancer Father   . Diabetes Sister   . Heart disease Sister        sister and brothers x 2  . Kidney disease Sister   . Kidney disease  Brother   . Heart disease Brother   . Stomach cancer Maternal Aunt   . Colon cancer Neg Hx   . Breast cancer Neg Hx      Allergies as of 09/07/2018      Reactions   Tape Other (See Comments)   White adhesive tape -blisters   Alendronate Sodium Other (See Comments)   REACTION: jaw pain   Cortizone-10 [hydrocortisone] Other (See Comments)   Pt received cortizone shot, and became very flushed, and hot, blisters, raw areas around lips & ears    Oxycodone-acetaminophen Hives   Penicillins Swelling, Rash      Medication List       Accurate as of September 07, 2018 11:59 PM. If you have any questions, ask your nurse or doctor.        aspirin 81 MG tablet Take 81 mg by mouth daily.   CALCIUM 1200+D3 PO Take by mouth.   clonazePAM 0.5 MG tablet Commonly known as: KLONOPIN TAKE 1 TABLET(0.5 MG) BY MOUTH THREE TIMES DAILY AS NEEDED FOR ANXIETY   escitalopram 10 MG tablet Commonly known as: LEXAPRO Take 0.5 tablets (5 mg total) by mouth daily. What changed: how much to take Changed by: Kathlene November, MD   ketorolac 0.4 % Soln Commonly known as: ACULAR INT 1 GTT IN OS QID STARTING 1 DAY B SURGERY   meloxicam 7.5 MG tablet Commonly known as: MOBIC TAKE 1 TABLET(7.5 MG) BY MOUTH DAILY   multivitamin tablet Take 1 tablet by mouth daily. Reported on 08/25/2015   ofloxacin 0.3 % ophthalmic solution Commonly known as: OCUFLOX INT 1 GTT IN OS QID STARTING 1 DAY B SURGERY   prednisoLONE acetate 1 % ophthalmic suspension Commonly known as: PRED FORTE SHAKE LQ AND INT 1 GTT IN OS QID AFTER SURGERY   simvastatin 40 MG tablet Commonly known as: ZOCOR Take 1 tablet (40 mg total) by mouth at bedtime.           Objective:   Physical Exam BP 138/75 (BP Location: Left Arm, Patient Position: Sitting, Cuff Size: Small)   Pulse 65   Temp 97.7 F (36.5 C) (Oral)   Resp 16   Ht 5' (1.524 m)   Wt 157 lb 2 oz (71.3 kg)   SpO2 100%   BMI 30.69 kg/m  General: Well developed, NAD, BMI  noted Neck: No  thyromegaly  HEENT:  Normocephalic . Face symmetric, atraumatic Left ear normal Right ear: Has a perforation at the tympanic membrane otherwise normal Lungs:  CTA B Normal respiratory effort, no intercostal retractions, no accessory muscle use. Heart: RRR,  no murmur.  No pretibial edema bilaterally  Abdomen:  Not distended, soft, non-tender. No rebound or rigidity.   Skin: Exposed areas without rash. Not pale. Not jaundice Neurologic:  alert & oriented X3.  Speech normal, gait appropriate for age, slightly limited, using a brace at the left ankle. Strength symmetric and appropriate for age.  Psych: Cognition and judgment appear intact.  Cooperative with normal attention span and concentration.  Behavior appropriate. No anxious or depressed appearing.     Assessment     Assessment  HTN -- on no med since ~ 2009 Hyperlipidemia Anxiety depression: onset Fall 2016 d/t husband health, lost sister 03-2015. rx lexapro 02-2015 GI --GERD --Esophageal stricture stretched 2014  --EGD/2017--->  gastritis, bx---reactive gastropathy, no H. pylori MSK: --DJD; Chronic neck pain ;  spinal stenosis;  multiple surgeries Osteoporosis DEXA osteoprosis 2006 (per pt) DEXA 11-08 normal  DEXA 9-12 mild osteopenia, rec exercise, ca and vit D T score 05-2015:  (-) 1.4 T score 06/19/2017:  (-) 2.1, RX  vit d, exercise , recheck in 2 years Ao [place] 03-2015 - no AAA  PLAN: HTN: Seems well controlled on no medications. Hyperlipidemia: Continue simvastatin, checking a CMP and FLP Anxiety depression: Feels well, the major driver of her anxiety is her husband's health and he is doing great.  Wonders if she could stop Lexapro.  We agreed on decrease it to 5 mg daily and continue clonazepam as needed (Typically takes only qhs). MSK: Had a hard time this year with left foot fracture, finally doing better, on meloxicam lately, takes with food and with no apparent GI side effects. Perforated  TM: Refer to ENT. Constipation: Mild, described hard stools.  Recommend Colace OTC and call if not improving. RTC 1 year   Today in addition to her physical exam I spent more than 20 minutes managing her chronic medical problems and  new problems as well including a perforated TM and constipation

## 2018-09-09 NOTE — Assessment & Plan Note (Signed)
HTN: Seems well controlled on no medications. Hyperlipidemia: Continue simvastatin, checking a CMP and FLP Anxiety depression: Feels well, the major driver of her anxiety is her husband's health and he is doing great.  Wonders if she could stop Lexapro.  We agreed on decrease it to 5 mg daily and continue clonazepam as needed (Typically takes only qhs). MSK: Had a hard time this year with left foot fracture, finally doing better, on meloxicam lately, takes with food and with no apparent GI side effects. Perforated TM: Refer to ENT. Constipation: Mild, described hard stools.  Recommend Colace OTC and call if not improving. RTC 1 year

## 2018-09-10 NOTE — Addendum Note (Signed)
Addended byDamita Dunnings D on: 09/10/2018 09:37 AM   Modules accepted: Orders

## 2018-09-17 ENCOUNTER — Telehealth: Payer: Self-pay | Admitting: *Deleted

## 2018-09-17 NOTE — Telephone Encounter (Signed)
DOS 09/28/2018; [id number] - Altamese Calloway AND [id number] - METATARSAL OSTEOTOMY 5TH RIGHT FOOT  HTA: Authorization is required.  AUTHORIZATION # [id number] Valid from 09/28/2018 - 12/27/2018

## 2018-09-20 ENCOUNTER — Other Ambulatory Visit: Payer: PPO

## 2018-09-22 LAB — HM MAMMOGRAPHY

## 2018-09-26 ENCOUNTER — Other Ambulatory Visit: Payer: Self-pay | Admitting: Podiatry

## 2018-09-26 MED ORDER — HYDROCODONE-ACETAMINOPHEN 10-325 MG PO TABS: 1.0000 | ORAL_TABLET | Freq: Four times a day (QID) | ORAL | 0 refills | Status: AC | PRN

## 2018-09-26 MED ORDER — CLINDAMYCIN HCL 150 MG PO CAPS: 150.0000 mg | ORAL_CAPSULE | Freq: Three times a day (TID) | ORAL | 0 refills | Status: DC

## 2018-09-26 MED ORDER — ONDANSETRON HCL 4 MG PO TABS: 4.0000 mg | ORAL_TABLET | Freq: Three times a day (TID) | ORAL | 0 refills | Status: DC | PRN

## 2018-09-28 DIAGNOSIS — M2041 Other hammer toe(s) (acquired), right foot: Secondary | ICD-10-CM | POA: Diagnosis not present

## 2018-09-28 DIAGNOSIS — M216X1 Other acquired deformities of right foot: Secondary | ICD-10-CM | POA: Diagnosis not present

## 2018-09-28 DIAGNOSIS — M25571 Pain in right ankle and joints of right foot: Secondary | ICD-10-CM | POA: Diagnosis not present

## 2018-09-28 DIAGNOSIS — E7800 Pure hypercholesterolemia, unspecified: Secondary | ICD-10-CM | POA: Diagnosis not present

## 2018-09-28 DIAGNOSIS — M21541 Acquired clubfoot, right foot: Secondary | ICD-10-CM | POA: Diagnosis not present

## 2018-09-28 DIAGNOSIS — M2011 Hallux valgus (acquired), right foot: Secondary | ICD-10-CM | POA: Diagnosis not present

## 2018-10-01 ENCOUNTER — Encounter: Payer: Self-pay | Admitting: Internal Medicine

## 2018-10-04 ENCOUNTER — Other Ambulatory Visit: Payer: Self-pay

## 2018-10-04 ENCOUNTER — Encounter: Payer: Self-pay | Admitting: Podiatry

## 2018-10-04 ENCOUNTER — Ambulatory Visit (INDEPENDENT_AMBULATORY_CARE_PROVIDER_SITE_OTHER): Payer: PPO

## 2018-10-04 ENCOUNTER — Ambulatory Visit (INDEPENDENT_AMBULATORY_CARE_PROVIDER_SITE_OTHER): Payer: Self-pay | Admitting: Podiatry

## 2018-10-04 VITALS — Temp 97.6°F

## 2018-10-04 DIAGNOSIS — M2011 Hallux valgus (acquired), right foot: Secondary | ICD-10-CM

## 2018-10-04 DIAGNOSIS — Z09 Encounter for follow-up examination after completed treatment for conditions other than malignant neoplasm: Secondary | ICD-10-CM

## 2018-10-04 NOTE — Progress Notes (Signed)
She presents today date of surgery 09/28/2018 [facility] bunionectomy right with the fifth metatarsal osteotomy right states my foot feels pretty good I only needed ibuprofen no narcotic.  Objective: Vital signs are stable alert oriented x3.  Pulses are palpable.  Dry sterile dressing was intact once removed demonstrates mild edema mild erythema no cellulitis drainage odor incision sites appear to be healing very nicely.  She has good range of motion of the toes without crepitation.  Radiographs taken today demonstrate osteotomies are intact double screw fixation fifth right single screw fixation first metatarsal left.  Assessment: Well-healing surgical foot.  Plan: Redressed today dressed a compressive dressing follow-up with Jessica in 1 week for change of cam walker to Darco shoe and compression anklet with suture removal.

## 2018-10-11 ENCOUNTER — Other Ambulatory Visit: Payer: Self-pay

## 2018-10-11 ENCOUNTER — Encounter: Payer: Self-pay | Admitting: Podiatry

## 2018-10-11 ENCOUNTER — Other Ambulatory Visit: Payer: Self-pay | Admitting: Podiatry

## 2018-10-11 ENCOUNTER — Ambulatory Visit (INDEPENDENT_AMBULATORY_CARE_PROVIDER_SITE_OTHER): Payer: PPO | Admitting: Podiatry

## 2018-10-11 VITALS — Temp 97.6°F

## 2018-10-11 DIAGNOSIS — M2011 Hallux valgus (acquired), right foot: Secondary | ICD-10-CM

## 2018-10-11 DIAGNOSIS — Z09 Encounter for follow-up examination after completed treatment for conditions other than malignant neoplasm: Secondary | ICD-10-CM

## 2018-10-11 NOTE — Progress Notes (Signed)
She presents today for postop visit status post [place] bunionectomy right and metatarsal osteotomy fifth right.  States my foot is feeling pretty good I am only having a little soreness.  Objective: Vital signs are stable alert and oriented x3.  Pulses are palpable.  Sutures were intact margins remain well coapted there is moderate edema no erythema cellulitis drainage or odor she has great range of motion of the first metatarsal phalangeal joint of the right foot.  The toe is rectus as it is the fifth toe right.  Assessment: Well-healing surgical foot.  Plan: Rebecca [name] ahead and removed all of the sutures today placed her in a compression anklet and a Darco shoe would like to follow-up with her in 2 weeks.

## 2018-10-12 ENCOUNTER — Other Ambulatory Visit: Payer: Self-pay

## 2018-10-12 NOTE — Telephone Encounter (Signed)
Please Advise? thanks

## 2018-10-16 ENCOUNTER — Other Ambulatory Visit: Payer: Self-pay | Admitting: *Deleted

## 2018-10-16 MED ORDER — MELOXICAM 7.5 MG PO TABS: 7.5000 mg | ORAL_TABLET | Freq: Every day | ORAL | 0 refills | Status: DC

## 2018-10-16 NOTE — Telephone Encounter (Signed)
Refill the meloxicam 7.5 mg, 30 tablets per Dr Milinda Pointer. Lattie Haw

## 2018-10-18 DIAGNOSIS — H7291 Unspecified perforation of tympanic membrane, right ear: Secondary | ICD-10-CM | POA: Diagnosis not present

## 2018-10-18 DIAGNOSIS — M26629 Arthralgia of temporomandibular joint, unspecified side: Secondary | ICD-10-CM | POA: Diagnosis not present

## 2018-10-25 ENCOUNTER — Encounter: Payer: Self-pay | Admitting: Podiatry

## 2018-10-25 ENCOUNTER — Ambulatory Visit (INDEPENDENT_AMBULATORY_CARE_PROVIDER_SITE_OTHER): Payer: PPO | Admitting: Podiatry

## 2018-10-25 ENCOUNTER — Other Ambulatory Visit: Payer: Self-pay

## 2018-10-25 ENCOUNTER — Ambulatory Visit (INDEPENDENT_AMBULATORY_CARE_PROVIDER_SITE_OTHER): Payer: PPO

## 2018-10-25 VITALS — Temp 98.0°F

## 2018-10-25 DIAGNOSIS — M2011 Hallux valgus (acquired), right foot: Secondary | ICD-10-CM | POA: Diagnosis not present

## 2018-10-25 DIAGNOSIS — Z09 Encounter for follow-up examination after completed treatment for conditions other than malignant neoplasm: Secondary | ICD-10-CM

## 2018-10-25 DIAGNOSIS — M21621 Bunionette of right foot: Secondary | ICD-10-CM

## 2018-10-25 NOTE — Progress Notes (Signed)
She presents today date of surgery 09/28/2018 status post Liane Comber bunionectomy with screw right foot as well as the fifth metatarsal osteotomy with screws right foot.  States that my little toe hurts a little bit and that she really does not fit as she refers to a postop shoe that she had brought with her at last visit.  She states that is too loose around the ankle and my foot is flopping and rolling to the side.  Objective: Vital signs are stable she is alert and oriented x3 mild to moderate edema about the forefoot right.  She has great range of motion of the first metatarsal phalangeal joint of the right foot with mild swelling overlying the fifth right radiographs taken today demonstrate well-healing osteotomy first metatarsal with internal fixation fifth metatarsal appears to have broken off and moved medially with retention of the 2 screws.  More than likely due to the shoe or osteoarthritic change.  Assessment: Well-healing surgical foot.  Plan: I encouraged range of motion's we did dispense a new Darco shoe that will provide more stability for her and I will follow-up with her in about 2 weeks for recheck.

## 2018-11-01 DIAGNOSIS — H26493 Other secondary cataract, bilateral: Secondary | ICD-10-CM | POA: Diagnosis not present

## 2018-11-01 DIAGNOSIS — H04123 Dry eye syndrome of bilateral lacrimal glands: Secondary | ICD-10-CM | POA: Diagnosis not present

## 2018-11-01 DIAGNOSIS — H524 Presbyopia: Secondary | ICD-10-CM | POA: Diagnosis not present

## 2018-11-01 DIAGNOSIS — H52201 Unspecified astigmatism, right eye: Secondary | ICD-10-CM | POA: Diagnosis not present

## 2018-11-01 DIAGNOSIS — H5213 Myopia, bilateral: Secondary | ICD-10-CM | POA: Diagnosis not present

## 2018-11-01 DIAGNOSIS — Z961 Presence of intraocular lens: Secondary | ICD-10-CM | POA: Diagnosis not present

## 2018-11-04 ENCOUNTER — Other Ambulatory Visit: Payer: Self-pay | Admitting: Internal Medicine

## 2018-11-08 ENCOUNTER — Telehealth: Payer: Self-pay | Admitting: *Deleted

## 2018-11-08 ENCOUNTER — Encounter: Payer: Self-pay | Admitting: Podiatry

## 2018-11-08 ENCOUNTER — Ambulatory Visit (INDEPENDENT_AMBULATORY_CARE_PROVIDER_SITE_OTHER): Payer: PPO

## 2018-11-08 ENCOUNTER — Ambulatory Visit (INDEPENDENT_AMBULATORY_CARE_PROVIDER_SITE_OTHER): Payer: PPO | Admitting: Podiatry

## 2018-11-08 ENCOUNTER — Other Ambulatory Visit: Payer: Self-pay

## 2018-11-08 DIAGNOSIS — M21621 Bunionette of right foot: Secondary | ICD-10-CM

## 2018-11-08 DIAGNOSIS — M2011 Hallux valgus (acquired), right foot: Secondary | ICD-10-CM

## 2018-11-08 DIAGNOSIS — T8484XA Pain due to internal orthopedic prosthetic devices, implants and grafts, initial encounter: Secondary | ICD-10-CM

## 2018-11-08 DIAGNOSIS — Z09 Encounter for follow-up examination after completed treatment for conditions other than malignant neoplasm: Secondary | ICD-10-CM

## 2018-11-08 NOTE — Patient Instructions (Signed)
Pre-Operative Instructions  Congratulations, you have decided to take an important step towards improving your quality of life.  You can be assured that the doctors and staff at [facility] will be with you every step of the way.  Here are some important things you should know:  1. Plan to be at the surgery center/hospital at least 1 (one) hour prior to your scheduled time, unless otherwise directed by the surgical center/hospital staff.  You must have a responsible adult accompany you, remain during the surgery and drive you home.  Make sure you have directions to the surgical center/hospital to ensure you arrive on time. 2. If you are having surgery at [facility] or [facility] hospitals, you will need a copy of your medical history and physical form from your family physician within one month prior to the date of surgery. We will give you a form for your primary physician to complete.  3. We make every effort to accommodate the date you request for surgery.  However, there are times where surgery dates or times have to be moved.  We will contact you as soon as possible if a change in schedule is required.   4. No aspirin/ibuprofen for one week before surgery.  If you are on aspirin, any non-steroidal anti-inflammatory medications (Mobic, Aleve, Ibuprofen) should not be taken seven (7) days prior to your surgery.  You make take Tylenol for pain prior to surgery.  5. Medications - If you are taking daily heart and blood pressure medications, seizure, reflux, allergy, asthma, anxiety, pain or diabetes medications, make sure you notify the surgery center/hospital before the day of surgery so they can tell you which medications you should take or avoid the day of surgery. 6. No food or drink after midnight the night before surgery unless directed otherwise by surgical center/hospital staff. 7. No alcoholic beverages 24-hours prior to surgery.  No smoking 24-hours prior or 24-hours after  surgery. 8. Wear loose pants or shorts. They should be loose enough to fit over bandages, boots, and casts. 9. Don't wear slip-on shoes. Sneakers are preferred. 10. Bring your boot with you to the surgery center/hospital.  Also bring crutches or a walker if your physician has prescribed it for you.  If you do not have this equipment, it will be provided for you after surgery. 11. If you have not been contacted by the surgery center/hospital by the day before your surgery, call to confirm the date and time of your surgery. 12. Leave-time from work may vary depending on the type of surgery you have.  Appropriate arrangements should be made prior to surgery with your employer. 13. Prescriptions will be provided immediately following surgery by your doctor.  Fill these as soon as possible after surgery and take the medication as directed. Pain medications will not be refilled on weekends and must be approved by the doctor. 14. Remove nail polish on the operative foot and avoid getting pedicures prior to surgery. 15. Wash the night before surgery.  The night before surgery wash the foot and leg well with water and the antibacterial soap provided. Be sure to pay special attention to beneath the toenails and in between the toes.  Wash for at least three (3) minutes. Rinse thoroughly with water and dry well with a towel.  Perform this wash unless told not to do so by your physician.  Enclosed: 1 Ice pack (please put in freezer the night before surgery)   1 Hibiclens skin cleaner     Pre-op instructions  If you have any questions regarding the instructions, please do not hesitate to call our office.  [place]: [address], [place], [place] [address] -- [phone]  [place]: [address] [place], [place] [address] -- [phone]  [place]: [address]  [place], [place] [address] -- [phone]  [place]: [address], [address], [place], [place] [address] -- [phone]  Website:  [email or web address] 

## 2018-11-08 NOTE — Telephone Encounter (Signed)
DOS 11/09/2018 REMOVAL FIXATION DEEP SCREW 5TH RT FOOT - 20680  [employer] ADVANTAGE: Effective Date - 03/14/2018  Per Reita May - Call Ref. # - [id number] Deductible - $0 Co-pay - $225 Co-insurance - 80% / 20% Out of pocket max - $3400 / met $400  Per [place] - Call Ref. # [id number] Pre-certification is NOT REQUIRED

## 2018-11-09 ENCOUNTER — Encounter: Payer: Self-pay | Admitting: Podiatry

## 2018-11-09 DIAGNOSIS — E7800 Pure hypercholesterolemia, unspecified: Secondary | ICD-10-CM | POA: Diagnosis not present

## 2018-11-09 DIAGNOSIS — M25571 Pain in right ankle and joints of right foot: Secondary | ICD-10-CM | POA: Diagnosis not present

## 2018-11-09 DIAGNOSIS — Z4889 Encounter for other specified surgical aftercare: Secondary | ICD-10-CM | POA: Diagnosis not present

## 2018-11-09 DIAGNOSIS — T8484XA Pain due to internal orthopedic prosthetic devices, implants and grafts, initial encounter: Secondary | ICD-10-CM | POA: Diagnosis not present

## 2018-11-10 ENCOUNTER — Encounter: Payer: Self-pay | Admitting: Podiatry

## 2018-11-10 NOTE — Progress Notes (Signed)
She presents today date of surgery September 28, 2018 status post Liane Comber bunionectomy right metatarsal osteotomy fifth right states that my foot feels okay it hurts over here by the little toe and has a little bit of a sore there.  She denies any trauma.  Objective: Vital signs are stable alert and oriented x3.  Pulses are palpable.  She has great range of motion of the first metatarsophalangeal joint mild swelling and a what appears to be a small stitch abscess the distalmost aspect of the incision overlying the fifth metatarsal.  No purulence no malodor noted.  Radiographs taken today does demonstrate a slight change in position of the fifth met head and both of the screws appear to be loosened.  One screw is retrograding considerably.  Assessment: Painful internal fixation stitch abscess remainder the foot appears to be healing well.  Plan: Discussed etiology pathology conservative versus surgical therapies at this point I highly recommend going tomorrow to have the screw removed and removing the stitch abscess.  We consented her today for screw removal fifth metatarsal.  She understands the pros and cons of the surgery as well as the possible complications.

## 2018-11-13 ENCOUNTER — Telehealth: Payer: Self-pay | Admitting: Internal Medicine

## 2018-11-13 NOTE — Telephone Encounter (Signed)
Clonazepam refill.   Last OV: 09/07/2018 Last Fill: 02/12/2018 #270 and 0RF Pt sog: 1 tab tid prn UDS: 07/24/2017 Low risk

## 2018-11-13 NOTE — Telephone Encounter (Signed)
Sent!

## 2018-11-15 ENCOUNTER — Ambulatory Visit (INDEPENDENT_AMBULATORY_CARE_PROVIDER_SITE_OTHER): Payer: PPO

## 2018-11-15 ENCOUNTER — Other Ambulatory Visit: Payer: Self-pay

## 2018-11-15 ENCOUNTER — Ambulatory Visit (INDEPENDENT_AMBULATORY_CARE_PROVIDER_SITE_OTHER): Payer: PPO | Admitting: Podiatry

## 2018-11-15 ENCOUNTER — Encounter: Payer: Self-pay | Admitting: Podiatry

## 2018-11-15 ENCOUNTER — Encounter

## 2018-11-15 DIAGNOSIS — Z09 Encounter for follow-up examination after completed treatment for conditions other than malignant neoplasm: Secondary | ICD-10-CM

## 2018-11-15 DIAGNOSIS — T8484XA Pain due to internal orthopedic prosthetic devices, implants and grafts, initial encounter: Secondary | ICD-10-CM | POA: Diagnosis not present

## 2018-11-15 NOTE — Progress Notes (Signed)
She presents today for a postop visit.  Date of surgery 11/09/2018 with removal of painful internal fixation fifth metatarsal right foot.  She denies fever chills nausea vomiting muscle aches or pains.  Objective: Vital signs are stable alert oriented x3 there is no erythema edema cellulitis drainage or odor.  Sutures are intact margins are well coapted.  Assessment: Well-healing surgical foot right.  Plan: Redressed today dressed a compressive dressing follow-up with me in 1 week.

## 2018-11-20 NOTE — Progress Notes (Signed)
DOS  11/09/2018  Removal fixation screw right fifth toe.

## 2018-11-22 ENCOUNTER — Ambulatory Visit (INDEPENDENT_AMBULATORY_CARE_PROVIDER_SITE_OTHER): Payer: PPO | Admitting: Podiatry

## 2018-11-22 ENCOUNTER — Other Ambulatory Visit: Payer: Self-pay

## 2018-11-22 DIAGNOSIS — Z09 Encounter for follow-up examination after completed treatment for conditions other than malignant neoplasm: Secondary | ICD-10-CM

## 2018-11-22 DIAGNOSIS — T8484XA Pain due to internal orthopedic prosthetic devices, implants and grafts, initial encounter: Secondary | ICD-10-CM

## 2018-11-24 ENCOUNTER — Encounter: Payer: Self-pay | Admitting: Podiatry

## 2018-11-24 NOTE — Progress Notes (Signed)
She presents today date of surgery 11/09/2018 status post removal 2 screws fifth metatarsal of the right foot.  States that left foot is feeling much better than it did 2 weeks ago.  She is very happy with the outcome of her surgery at this point.  Denies fever chills nausea vomiting muscle aches pains calf pain back pain chest pain shortness of breath.  Continues to wear her Darco shoe.  Objective: Vital signs are stable alert and oriented x3.  Pulses are palpable.  Neurologic sensorium is intact.  Degenerative flexors are intact.  Muscle strength is normal symmetrical.  She has mild tenderness overlying the incision site which I went ahead and remove the sutures today margins remain well coapted also no signs of infection.  Assessment: Well-healing surgical foot right.  Plan: I would like for her to continue the use of the Darco shoe for at least another couple of weeks.  Would like to go ahead and consider x-rays for next visit in 2 to 3 weeks.  We want to reevaluate the fifth metatarsal head healing.

## 2018-12-06 ENCOUNTER — Ambulatory Visit (INDEPENDENT_AMBULATORY_CARE_PROVIDER_SITE_OTHER): Payer: PPO | Admitting: Podiatry

## 2018-12-06 ENCOUNTER — Other Ambulatory Visit: Payer: Self-pay

## 2018-12-06 ENCOUNTER — Encounter: Payer: Self-pay | Admitting: Podiatry

## 2018-12-06 DIAGNOSIS — Z09 Encounter for follow-up examination after completed treatment for conditions other than malignant neoplasm: Secondary | ICD-10-CM

## 2018-12-06 DIAGNOSIS — T8484XA Pain due to internal orthopedic prosthetic devices, implants and grafts, initial encounter: Secondary | ICD-10-CM

## 2018-12-06 NOTE — Progress Notes (Signed)
She presents today for postop visit date of surgery 11/09/2018 removal of screw fifth metatarsal right foot.  She states that once in a while get a twinge of pain but is doing much better than it was.  She denies fever chills nausea vomiting muscle aches and pains.  Objective: Pulses are palpable mild edema to the right foot should her scar or incision line is scarred down to the deeper structures and more than likely resulting in some of this tenderness.  I expressed to her that she need to massage this area to help breakdown the scar tissue and loosen the skin overlying the surgical site.  Assessment: Well-healing surgical foot.  Plan: I like to follow-up with her in 1 month prior to her release.

## 2018-12-20 ENCOUNTER — Encounter: Payer: PPO | Admitting: Podiatry

## 2019-01-08 ENCOUNTER — Other Ambulatory Visit: Payer: Self-pay

## 2019-01-08 ENCOUNTER — Encounter: Payer: Self-pay | Admitting: Podiatry

## 2019-01-08 ENCOUNTER — Ambulatory Visit (INDEPENDENT_AMBULATORY_CARE_PROVIDER_SITE_OTHER): Payer: Self-pay | Admitting: Podiatry

## 2019-01-08 ENCOUNTER — Other Ambulatory Visit (INDEPENDENT_AMBULATORY_CARE_PROVIDER_SITE_OTHER): Payer: PPO

## 2019-01-08 DIAGNOSIS — T8484XA Pain due to internal orthopedic prosthetic devices, implants and grafts, initial encounter: Secondary | ICD-10-CM

## 2019-01-08 DIAGNOSIS — D696 Thrombocytopenia, unspecified: Secondary | ICD-10-CM | POA: Diagnosis not present

## 2019-01-08 DIAGNOSIS — Z09 Encounter for follow-up examination after completed treatment for conditions other than malignant neoplasm: Secondary | ICD-10-CM

## 2019-01-08 LAB — CBC WITH DIFFERENTIAL/PLATELET
Basophils Absolute: 0 10*3/uL (ref 0.0–0.1)
Basophils Relative: 0.7 % (ref 0.0–3.0)
Eosinophils Absolute: 0.1 10*3/uL (ref 0.0–0.7)
Eosinophils Relative: 4 % (ref 0.0–5.0)
HCT: 37.7 % (ref 36.0–46.0)
Hemoglobin: 12.7 g/dL (ref 12.0–15.0)
Lymphocytes Relative: 34.2 % (ref 12.0–46.0)
Lymphs Abs: 1.3 10*3/uL (ref 0.7–4.0)
MCHC: 33.6 g/dL (ref 30.0–36.0)
MCV: 92.1 fl (ref 78.0–100.0)
Monocytes Absolute: 0.4 10*3/uL (ref 0.1–1.0)
Monocytes Relative: 9.7 % (ref 3.0–12.0)
Neutro Abs: 1.9 10*3/uL (ref 1.4–7.7)
Neutrophils Relative %: 51.4 % (ref 43.0–77.0)
Platelets: 115 10*3/uL — ABNORMAL LOW (ref 150.0–400.0)
RBC: 4.1 Mil/uL (ref 3.87–5.11)
RDW: 13.6 % (ref 11.5–15.5)
WBC: 3.7 10*3/uL — ABNORMAL LOW (ref 4.0–10.5)

## 2019-01-08 MED ORDER — MELOXICAM 7.5 MG PO TABS: 7.5000 mg | ORAL_TABLET | Freq: Every day | ORAL | 3 refills | Status: DC

## 2019-01-09 NOTE — Progress Notes (Signed)
She presents today for follow-up of removal painful fixation fifth metatarsal right foot.  She denies fever chills nausea vomiting muscle aches and pain states that it feels 100% perfect.  Objective: Vital signs are stable she is alert and oriented x3 there is no erythema edema cellulitis drainage or odor this is gone on to heal uneventfully.  Assessment: Well-healing surgical foot.  Plan: Follow-up with me only as needed.

## 2019-04-24 DIAGNOSIS — L820 Inflamed seborrheic keratosis: Secondary | ICD-10-CM | POA: Diagnosis not present

## 2019-04-29 ENCOUNTER — Encounter: Payer: Self-pay | Admitting: Gastroenterology

## 2019-04-30 ENCOUNTER — Telehealth: Payer: Self-pay | Admitting: Internal Medicine

## 2019-04-30 NOTE — Chronic Care Management (AMB) (Signed)
  Chronic Care Management   Note  04/30/2019 Name: Rebecca [name] MRN: [id number] DOB: 17-Jun-1940  Rebecca [name] is a 79 y.o. year old female who is a primary care patient of Colon Branch, MD. I reached out to [facility] by phone today in response to a referral sent by Rebecca [name]'s PCP, Colon Branch, MD.   Rebecca [name] was given information about Chronic Care Management services today including:  1. CCM service includes personalized support from designated clinical staff supervised by her physician, including individualized plan of care and coordination with other care providers 2. 24/7 contact phone numbers for assistance for urgent and routine care needs. 3. Service will only be billed when office clinical staff spend 20 minutes or more in a month to coordinate care. 4. Only one practitioner may furnish and bill the service in a calendar month. 5. The patient may stop CCM services at any time (effective at the end of the month) by phone call to the office staff. 6. The patient will be responsible for cost sharing (co-pay) of up to 20% of the service fee (after annual deductible is met).  Patient agreed to services and verbal consent obtained.   Follow up plan:   Raynicia Dukes [employer] Scheduler

## 2019-05-01 ENCOUNTER — Other Ambulatory Visit: Payer: Self-pay | Admitting: Internal Medicine

## 2019-05-07 ENCOUNTER — Other Ambulatory Visit: Payer: Self-pay

## 2019-05-07 ENCOUNTER — Ambulatory Visit: Payer: PPO | Admitting: Pharmacist

## 2019-05-07 DIAGNOSIS — F329 Major depressive disorder, single episode, unspecified: Secondary | ICD-10-CM

## 2019-05-07 DIAGNOSIS — D696 Thrombocytopenia, unspecified: Secondary | ICD-10-CM

## 2019-05-07 DIAGNOSIS — M159 Polyosteoarthritis, unspecified: Secondary | ICD-10-CM

## 2019-05-07 DIAGNOSIS — I10 Essential (primary) hypertension: Secondary | ICD-10-CM

## 2019-05-07 DIAGNOSIS — F419 Anxiety disorder, unspecified: Secondary | ICD-10-CM

## 2019-05-07 DIAGNOSIS — E785 Hyperlipidemia, unspecified: Secondary | ICD-10-CM

## 2019-05-07 NOTE — Chronic Care Management (AMB) (Signed)
Chronic Care Management Pharmacy  Name: Rebecca [name]  MRN: [id number] DOB: Jan 01, 1941  Chief Complaint/ HPI  Rebecca [name],  79 y.o. , female presents for their Initial CCM visit with the clinical pharmacist via telephone due to COVID-19 Pandemic.  PCP : Colon Branch, MD  Their chronic conditions include: HTN, HLD, Osteoarthritis, Anxiety/Depression, Osteopenia  Office Visits: 09/07/18: Office visit w/ Dr. Larose Kells: HTN controlled, labs ordered (cmp, and flp), pt wonders if she could stop lexapro. Agreed to decrease to 5mg  and continue clonazepam PRN. L foot fracture rx'd meloxicam, concerns for ear (referral to ENT), constipation recommended to use colace OTC  Consult Visit: 01/08/19 + 12/06/18: Podiatry office visit w/ Dr. Milinda Pointer - post-op removal of painful fixation fifth metatarsal right foot. Well healing surgical foot.   11/22/18: Podiatry office visit w/ Dr. Milinda Pointer - Surgery date of 11/09/18. Well healing surgical foot. Pt to continue Darco she for at least another couple of weeks.   11/15/18:  Podiatry office visit w/ Dr. Milinda Pointer - Surgery date of 11/09/18. Well healing surgical foot. Redressed foot with compressive dressing and F/U in 1 week.   11/09/18: Removal of fixation screw right fifth toe by Dr. Milinda Pointer  11/08/18: Podiatry office visit w/ Dr. Milinda Pointer - S/P Liane Comber bunionectomy R metatarsal osteotomy 5th right toe on 09/28/18. Painful internal fixation stitch abscess. Recommended surgery for removal and pt agreeable  Medications: Outpatient Encounter Medications as of 05/07/2019  Medication Sig  . aspirin 81 MG tablet Take 81 mg by mouth daily.  . Calcium-Magnesium-Vitamin D (CALCIUM 1200+D3 PO) Take by mouth.  . clonazePAM (KLONOPIN) 0.5 MG tablet TAKE 1 TABLET(0.5 MG) BY MOUTH THREE TIMES DAILY AS NEEDED FOR ANXIETY  . escitalopram (LEXAPRO) 10 MG tablet Take 0.5 tablets (5 mg total) by mouth daily.  . meloxicam (MOBIC) 7.5 MG tablet Take 1 tablet (7.5 mg total) by mouth daily.    . Multiple Vitamin (MULTIVITAMIN) tablet Take 1 tablet by mouth daily. Reported on 08/25/2015  . simvastatin (ZOCOR) 40 MG tablet Take 1 tablet (40 mg total) by mouth at bedtime.   No facility-administered encounter medications on file as of 05/07/2019.   Immunization History  Administered Date(s) Administered  . Influenza Split 11/25/2011, 11/21/2013  . Influenza Whole 01/16/2007, 02/24/2009, 12/14/2009  . Influenza, High Dose Seasonal PF 12/19/2012, 12/22/2016, 12/16/2018  . Influenza,inj,Quad PF,6+ Mos 01/01/2018  . Influenza-Unspecified 12/17/2014, 11/24/2015, 12/22/2016  . Pneumococcal Conjugate-13 03/26/2014  . Pneumococcal Polysaccharide-23 01/16/2007, 07/24/2017  . Td 05/18/2016  . Zoster 12/29/2009     Current Diagnosis/Assessment:  Goals Addressed            This Visit's Progress   . Aspirin Use: Will discuss with Dr. Larose Kells if you should continue or stop taking aspirin 81mg        -New research has shown that people over 77 years of age don't benefit from taking aspirin 81mg  and are at increased risk of having gastrointestinal (GI) bleeding -I realize you have a family hx of heart disease, so this can be considered for reasoning to continue aspirin 81mg  -Since you are concerned about low platelets, your cholesterol levels are great, you don't smoke, and you are active, I would consider stopping aspirin, but I will see what Dr. Larose Kells thinks and then call you with his final decision    . Blood pressure goal <140/90      . Check blood pressure 2 to 3 times weekly and record readings      . Complete  DEXA Scan at next office visit      . Complete Shingrix vaccine series      . Consider completion of Covid vaccine series      . Pharmacy Care Plan       Current Barriers:  . Chronic Disease Management support, education, and care coordination needs related to HTN, HLD, Osteoarthritis, Anxiety/Depression, Osteopenia  Pharmacist Clinical Goal(s):  . Blood pressure goal  <140/90  Interventions: . Comprehensive medication review performed. . Pick up new prescription for escitalopram 5mg  1 tablet by mouth daily to replace escitalpram 10mg  1/2 tab by mouth daily . Check blood pressure 2-3 times per week  . Aspirin Use: Consultation with Dr. Larose Kells on continuation vs discontinuation . Add acetaminophen for pain or consider increasing meloxicam to 2 tablets of 7.5mg  daily (try acetaminophen 1st d/t caution with kidneys) . Complete DEXA scan at next office visit . Complete Shingrix vaccine series . Consider completion of Covid vaccine series  Patient Self Care Activities:  . Patient verbalizes understanding of plan to follow as described above, Self administers medications as prescribed, Calls pharmacy for medication refills, and Calls provider office for new concerns or questions  Initial goal documentation     . Pick up new prescription for escitalopram 5mg  tablets to replace taking 1/2 tablet of escitalopram 10mg  tablets       -When you finish your prescription of escitalopram 10mg  tablets you can start using escitalopram 5mg  tablets to help simplify your medication regimen -Take 1 tablet of escitalopram 5mg  to replace taking 1/2 tablet of escitalopram 10mg      . Try adding acetaminophen to help with pain. Can consider taking 2 tablets of meloxicam 7.5mg  daily (would try acetaminophen 1st)       -Try to see if using acetaminophen helps with the pain before trying 2 tablets of meloxicam 7.5mg   -Increased doses of meloxicam can affect your kidneys. Please let me know if you decide to take more of the meloxicam versus the acetaminophen so I can make sure we follow your kidney function closely      Married to [facility] x 62 years in July. Has 4 kids, no grandkids. Responsible for her and her husband's meds. Sits the medicines in certain spots based on what times of Rebecca [name] they take them.   Hypertension   CMP Latest Ref Rng & Units 09/07/2018 07/24/2017 05/18/2016   Glucose 70 - 99 mg/dL 84 96 91  BUN 6 - 23 mg/dL 28(H) 22 23  Creatinine 0.40 - 1.20 mg/dL 0.99 0.97 1.14  Sodium 135 - 145 mEq/L 143 141 143  Potassium 3.5 - 5.1 mEq/L 4.0 4.6 5.1  Chloride 96 - 112 mEq/L 109 107 112  CO2 19 - 32 mEq/L 27 28 25   Calcium 8.4 - 10.5 mg/dL 8.7 9.5 9.8  Total Protein 6.0 - 8.3 g/dL 6.1 7.2 -  Total Bilirubin 0.2 - 1.2 mg/dL 0.6 0.6 -  Alkaline Phos 39 - 117 U/L 45 57 -  AST 0 - 37 U/L 25 32 30  ALT 0 - 35 U/L 19 23 35  GFR      54.21   59.17   49.26  BP today is: 140/65 pulse 61  Office blood pressures are  BP Readings from Last 3 Encounters:  09/07/18 138/75  07/24/17 124/64  05/22/17 (!) 112/56    Patient has failed these meds in the past: None noted  Patient is currently controlled on the following medications: None  Patient checks BP at  home infrequently  We discussed Proper blood pressure technique  Plan -Continue control with diet and exercise  -Check blood pressure 2-3 times per week and record readings    Hyperlipidemia   Lipid Panel     Component Value Date/Time   CHOL 133 09/07/2018 0846   TRIG 46.0 09/07/2018 0846   TRIG 40 02/14/2006 1005   HDL 62.60 09/07/2018 0846   CHOLHDL 2 09/07/2018 0846   VLDL 9.2 09/07/2018 0846   LDLCALC 62 09/07/2018 0846     ASCVD 10-year risk: Unable to calculate due to LDL <70  Patient has failed these meds in past: None noted Patient is currently controlled on the following medications: simvastatin 40mg  daily  We discussed:  diet and exercise extensively  Plan -Continue current medications  Osteoarthritis   Patient has failed these meds in past: None noted (tramadol and naproxen 220mg  listed in D/C meds. No apparent reason for D/C) Patient is currently controlled on the following medications: meloxicam 7.5mg  daily  She is wondering if there is anything that can help her get a little more relief.  We discussed:  risk/benefit of increased NSAID use and the effect it can have  on the kidneys vs pain relief  Plan -Try taking 2 meloxicam 7.5mg  tabs to see if there is more relief. You can also try adding acetaminophen 500mg  PRN in addition to meloxicam   Depression/Anxiety   Patient has failed these meds in past: None noted Patient is currently controlled on the following medications: escitalopram 10mg  1/2 tab daily (5mg ), clonazepam 0.5mg  TID prn anxiety  PHQ9: 0  We discussed: changing escitalopram to 5mg  tablet daily   Plan -Send new prescription for escitalopram 5mg  once daily vs 10mg  1/2 tab daily (same dose)  Osteopenia   06/19/2017: DEXA = Osteopenia T-Scores Femur Neck Left = -2.1   Patient has failed these meds in past: None noted Patient is currently stable, need repeat DEXA on the following medications: calcium carbonate 600mg /400 units 2 tabs daily  We discussed:  results of last DEXA scan  Plan -Complete DEXA Scan at next office visit -Continue current medications   Vaccines   We discussed:  Shingrix and covid vaccine  Plan -Complete Shingrix vaccine series -Consider Covid vaccine series  Thrombocytopenia   CBC Latest Ref Rng & Units 01/08/2019 09/07/2018 07/24/2017  WBC 4.0 - 10.5 K/uL 3.7(L) 3.6(L) 4.2  Hemoglobin 12.0 - 15.0 g/dL 12.7 12.5 12.9  Hematocrit 36.0 - 46.0 % 37.7 37.7 38.0  Platelets 150.0 - 400.0 K/uL 115.0(L) 103.0(L) 128.0(L)   Patient has failed these meds in past: None noted Patient is currently stable, but below normal limits on the following medications: b complex 1 tab daily, b12 5050mcg daily  Aspirin 81mg  -Pt has been taking for years. Recommended by previous PCP. Discussed newest evidence (ASPREE Trial) that noted no net benefit of aspirin therapy for primary prevention in patients >70 years. Pt does have family hx of heart dx (2 brothers, sister, and father). This is countered by her LDL <70, she does not smoke, she is active, and currently on a statin. Noting that she also is concerned with low  platelets (thrombocytopenia), I would consider D/C aspirin therapy. Pt would like me to consult with Dr. Larose Kells before making decision. Agreeable to plan as there is no major risk anticipated.  She started taking both b complex and b12 due to having low plts; discussed the duplication, but pt would like to retain medication regimen  We discussed:  risk/benefit of  aspirin therapy  Plan -Discuss continuation vs discontinuation of aspirin with Dr. Larose Kells

## 2019-05-08 ENCOUNTER — Other Ambulatory Visit: Payer: Self-pay

## 2019-05-08 ENCOUNTER — Telehealth: Payer: Self-pay | Admitting: Internal Medicine

## 2019-05-08 NOTE — Progress Notes (Signed)
  I have reviewed this encounter including the documentation in this note and/or discussed this patient with the care management provider. I am certifying that I agree with the content of this note as supervising physician.  Specifically, I agree that aspirin is not indicated at this time .  We will notify the patient.  JP 05/08/2019

## 2019-05-08 NOTE — Telephone Encounter (Signed)
Please advise patient, I agree with our pharmacists recommendation to stop aspirin.

## 2019-05-08 NOTE — Patient Instructions (Signed)
Visit Information  Goals Addressed            This Visit's Progress   . Aspirin Use: Will discuss with Dr. Larose Kells if you should continue or stop taking aspirin 30m       -New research has shown that people over 755years of age don't benefit from taking aspirin 852mand are at increased risk of having gastrointestinal (GI) bleeding -I realize you have a family hx of heart disease, so this can be considered for reasoning to continue aspirin 8167mSince you are concerned about low platelets, your cholesterol levels are great, you don't smoke, and you are active, I would consider stopping aspirin, but I will see what Dr. PazLarose Kellsinks and then call you with his final decision    . Blood pressure goal <140/90      . Check blood pressure 2 to 3 times weekly and record readings      . Complete DEXA Scan at next office visit      . Complete Shingrix vaccine series      . Consider completion of Covid vaccine series      . Pharmacy Care Plan       Current Barriers:  . Chronic Disease Management support, education, and care coordination needs related to HTN, HLD, Osteoarthritis, Anxiety/Depression, Osteopenia  Pharmacist Clinical Goal(s):  . Blood pressure goal <140/90  Interventions: . Comprehensive medication review performed. . Pick up new prescription for escitalopram 5mg103mtablet by mouth daily to replace escitalpram 10mg83m tab by mouth daily . Check blood pressure 2-3 times per week  . Aspirin Use: Consultation with Dr. Paz oLarose Kellsontinuation vs discontinuation . Add acetaminophen for pain or consider increasing meloxicam to 2 tablets of 7.5mg d74my (try acetaminophen 1st d/t caution with kidneys) . Complete DEXA scan at next office visit . Complete Shingrix vaccine series . Consider completion of Covid vaccine series  Patient Self Care Activities:  . Patient verbalizes understanding of plan to follow as described above, Self administers medications as prescribed, Calls pharmacy for  medication refills, and Calls provider office for new concerns or questions  Initial goal documentation     . Pick up new prescription for escitalopram 5mg ta29mts to replace taking 1/2 tablet of escitalopram 10mg ta19ms       -When you finish your prescription of escitalopram 10mg tab24m you can start using escitalopram 5mg table39mto help simplify your medication regimen -Take 1 tablet of escitalopram 5mg to rep14me taking 1/2 tablet of escitalopram 10mg     . 45madding acetaminophen to help with pain. Can consider taking 2 tablets of meloxicam 7.5mg daily (w39md try acetaminophen 1st)       -Try to see if using acetaminophen helps with the pain before trying 2 tablets of meloxicam 7.5mg  -Increas59mdoses of meloxicam can affect your kidneys. Please let me know if you decide to take more of the meloxicam versus the acetaminophen so I can make sure we follow your kidney function closely       [name] was given information about Chronic Care Management services today including:  1. CCM service includes personalized support from designated clinical staff supervised by her physician, including individualized plan of care and coordination with other care providers 2. 24/7 contact phone numbers for assistance for urgent and routine care needs. 3. Service will only be billed when office clinical staff spend 20 minutes or more in a month to coordinate care. 4. Only one practitioner may  furnish and bill the service in a calendar month. 5. The patient may stop CCM services at any time (effective at the end of the month) by phone call to the office staff. 6. The patient will be responsible for cost sharing (co-pay) of up to 20% of the service fee (after annual deductible is met).  Patient agreed to services and verbal consent obtained.   The patient verbalized understanding of instructions provided today and agreed to receive a mailed copy of patient instruction and/or educational  materials. The pharmacy team will reach out to the patient again over the next 30 days.   De Blanch, PharmD Clinical Pharmacist [facility] [phone]   Blood Pressure Record Sheet To take your blood pressure, you will need a blood pressure machine. You can buy a blood pressure machine (blood pressure monitor) at your clinic, drug store, or online. When choosing one, consider:  An automatic monitor that has an arm cuff.  A cuff that wraps snugly around your upper arm. You should be able to fit only one finger between your arm and the cuff.  A device that stores blood pressure reading results.  Do not choose a monitor that measures your blood pressure from your wrist or finger. Follow your health care provider's instructions for how to take your blood pressure. To use this form:  Get one reading in the morning (a.m.) before you take any medicines.  Get one reading in the evening (p.m.) before supper.  Take at least 2 readings with each blood pressure check. This makes sure the results are correct. Wait 1-2 minutes between measurements.  Write down the results in the spaces on this form.  Repeat this once a week, or as told by your health care provider.  Make a follow-up appointment with your health care provider to discuss the results. Blood pressure log Date: _______________________  a.m. _____________________(1st reading) _____________________(2nd reading)  p.m. _____________________(1st reading) _____________________(2nd reading) Date: _______________________  a.m. _____________________(1st reading) _____________________(2nd reading)  p.m. _____________________(1st reading) _____________________(2nd reading) Date: _______________________  a.m. _____________________(1st reading) _____________________(2nd reading)  p.m. _____________________(1st reading) _____________________(2nd reading) Date: _______________________  a.m.  _____________________(1st reading) _____________________(2nd reading)  p.m. _____________________(1st reading) _____________________(2nd reading) Date: _______________________  a.m. _____________________(1st reading) _____________________(2nd reading)  p.m. _____________________(1st reading) _____________________(2nd reading) This information is not intended to replace advice given to you by your health care provider. Make sure you discuss any questions you have with your health care provider. Document Revised: 04/28/2017 Document Reviewed: 02/28/2017 Elsevier Patient Education  [place].   How to Take Your Blood Pressure You can take your blood pressure at home with a machine. You may need to check your blood pressure at home:  To check if you have high blood pressure (hypertension).  To check your blood pressure over time.  To make sure your blood pressure medicine is working. Supplies needed: You will need a blood pressure machine, or monitor. You can buy one at a drugstore or online. When choosing one:  Choose one with an arm cuff.  Choose one that wraps around your upper arm. Only one finger should fit between your arm and the cuff.  Do not choose one that measures your blood pressure from your wrist or finger. Your doctor can suggest a monitor. How to prepare Avoid these things for 30 minutes before checking your blood pressure:  Drinking caffeine.  Drinking alcohol.  Eating.  Smoking.  Exercising. Five minutes before checking your blood pressure:  Pee.  Sit  in a dining chair. Avoid sitting in a soft couch or armchair.  Be quiet. Do not talk. How to take your blood pressure Follow the instructions that came with your machine. If you have a digital blood pressure monitor, these may be the instructions: 1. Sit up straight. 2. Place your feet on the floor. Do not cross your ankles or legs. 3. Rest your left arm at the level of your heart. You may rest  it on a table, desk, or chair. 4. Pull up your shirt sleeve. 5. Wrap the blood pressure cuff around the upper part of your left arm. The cuff should be 1 inch (2.5 cm) above your elbow. It is best to wrap the cuff around bare skin. 6. Fit the cuff snugly around your arm. You should be able to place only one finger between the cuff and your arm. 7. Put the cord inside the groove of your elbow. 8. Press the power button. 9. Sit quietly while the cuff fills with air and loses air. 10. Write down the numbers on the screen. 11. Wait 2-3 minutes and then repeat steps 1-10. What do the numbers mean? Two numbers make up your blood pressure. The first number is called systolic pressure. The second is called diastolic pressure. An example of a blood pressure reading is "120 over 80" (or 120/80). If you are an adult and do not have a medical condition, use this guide to find out if your blood pressure is normal: Normal  First number: below 120.  Second number: below 80. Elevated  First number: 120-129.  Second number: below 80. Hypertension stage 1  First number: 130-139.  Second number: 80-89. Hypertension stage 2  First number: 140 or above.  Second number: 45 or above. Your blood pressure is above normal even if only the top or bottom number is above normal. Follow these instructions at home:  Check your blood pressure as often as your doctor tells you to.  Take your monitor to your next doctor's appointment. Your doctor will: ? Make sure you are using it correctly. ? Make sure it is working right.  Make sure you understand what your blood pressure numbers should be.  Tell your doctor if your medicines are causing side effects. Contact a doctor if:  Your blood pressure keeps being high. Get help right away if:  Your first blood pressure number is higher than 180.  Your second blood pressure number is higher than 120. This information is not intended to replace advice given  to you by your health care provider. Make sure you discuss any questions you have with your health care provider. Document Revised: 02/10/2017 Document Reviewed: 08/07/2015 Elsevier Patient Education  2020 [employer].

## 2019-05-08 NOTE — Telephone Encounter (Signed)
Spoke w/ Pt- informed of PCP recommendations. Pt verbalized understanding. Med list updated.

## 2019-05-10 ENCOUNTER — Telehealth: Payer: Self-pay | Admitting: Internal Medicine

## 2019-05-10 MED ORDER — ESCITALOPRAM OXALATE 5 MG PO TABS: 5.0000 mg | ORAL_TABLET | Freq: Every day | ORAL | 2 refills | Status: DC

## 2019-05-10 NOTE — Telephone Encounter (Signed)
Agree, send escitalopram 5 mg 1 tablet daily

## 2019-05-10 NOTE — Telephone Encounter (Signed)
Please advise 

## 2019-05-10 NOTE — Telephone Encounter (Signed)
Pt states she spoke to pharmacist and she recommended that pt get a new script for 5mg  of escitalopram (LEXAPRO) 10 MG tablet   in stead of 10mg  so she dont have to cut them in half... She also request for 90 day supply..   Please advise

## 2019-05-10 NOTE — Telephone Encounter (Signed)
Escitalopram 5mg  once daily #90 and 2refills sent to [facility].

## 2019-05-23 ENCOUNTER — Other Ambulatory Visit: Payer: Self-pay

## 2019-05-23 ENCOUNTER — Encounter: Payer: Self-pay | Admitting: Gastroenterology

## 2019-05-23 ENCOUNTER — Ambulatory Visit: Payer: PPO | Admitting: Gastroenterology

## 2019-05-23 VITALS — BP 164/80 | HR 66 | Temp 97.2°F | Ht 60.0 in | Wt 167.0 lb

## 2019-05-23 DIAGNOSIS — K30 Functional dyspepsia: Secondary | ICD-10-CM

## 2019-05-23 DIAGNOSIS — R194 Change in bowel habit: Secondary | ICD-10-CM | POA: Diagnosis not present

## 2019-05-23 NOTE — Patient Instructions (Signed)
If you are age 79 or older, your body mass index should be between 23-30. Your Body mass index is 32.61 kg/m. If this is out of the aforementioned range listed, please consider follow up with your Primary Care Provider.  If you are age 79 or younger, your body mass index should be between 19-25. Your Body mass index is 32.61 kg/m. If this is out of the aformentioned range listed, please consider follow up with your Primary Care Provider.    Medication Samples have been provided to the patient.  Drug name: Lenis Noon: 12  LOT: [id number]  Exp.Date: 09-2020  Dosing instructions: two capsules a day 30 -90 min before a meal.  The patient has been instructed regarding the correct time, dose, and frequency of taking this medication, including desired effects and most common side effects.

## 2019-05-23 NOTE — Progress Notes (Signed)
[facility] Consult Note:  History: Rebecca [name] [facility] 05/23/2019  Referring provider: Colon Branch, MD  Reason for consult/chief complaint: Abdominal Pain (epigastric pain when eating food and drinking water.  Patient feels that her food is getting stuck.) and change in bowel habits   Subjective  HPI: Previously patient of Dr. [name], most recently 2014.  Last seen by me in office March 2017 for dysphagia and chronic epigastric pain that was both burning and sharp with significant reported weight loss, though some of that seem to predate the symptoms.  She was under significant stress with her husband's myeloma diagnosis (06/12/2015 office note has details).  CT abdomen and pelvis at that time without visible cause of symptoms.  Atherosclerosis but without mesenteric stenosis. EGD 06/25/2015 normal except for some mild patchy antral mucosal erythema, did not explain symptoms.  Biopsies negative for H. Pylori.  [name] saw me today with similar burning epigastric discomfort, seems triggered by certain foods like before, especially lettuce.  If she gets up and walks around for a while the pain subsides shortly afterwards.  Sometimes food seems to just sit in the upper abdomen, she does not get nauseated nor have vomiting or regurgitation.  She denies odynophagia, anorexia or weight loss. She has tended toward constipation for many years, but over the last 12 to 18 months has also had episodic urgency and loose stool, usually in the evening and lasting no more than an hour.  She has not been able to identify any clear food triggers.  Denies rectal bleeding.  Last colonoscopy with Dr. [name] February 2014.   ROS:  Review of Systems  Constitutional: Negative for appetite change and unexpected weight change.  HENT: Negative for mouth sores and voice change.   Eyes: Negative for pain and redness.  Respiratory: Negative for cough and shortness of breath.   Cardiovascular:  Negative for chest pain and palpitations.  Genitourinary: Negative for dysuria and hematuria.  Musculoskeletal: Positive for arthralgias and back pain. Negative for myalgias.  Skin: Negative for pallor and rash.  Neurological: Negative for weakness and headaches.  Hematological: Negative for adenopathy.  Psychiatric/Behavioral:       Anxiety   Rebecca [name] tells me that her anxiety is under much better control with Lexapro and occasional use of Klonopin.  Her husband continues to struggle with his health, including myeloma.  She had some stress earlier this week when some testing revealed that his myeloma was again active.  Fortunately, she has been sleeping well.  Past Medical History: Past Medical History:  Diagnosis Date  . Anxiety   . Arthritis    degenerative lumbar spine   . Chronic neck pain   . Closed fracture of base of fifth metatarsal bone 01/2018   Left  . Complication of anesthesia    at one time my blood pressure dropped -lumbar - on bp med at that time  . Depression   . Dysphagia    esophageal stretched 9-14   . History of blood transfusion 2009   post - lumbar fusion   . Hyperlipidemia   . Hypertension    No longer takes med.  . Osteoarthritis of left shoulder region    Mild, Dr. Mardelle Matte  . Osteoporosis      Past Surgical History: Past Surgical History:  Procedure Laterality Date  . ABDOMINAL HYSTERECTOMY    . ANTERIOR CERVICAL DECOMP/DISCECTOMY FUSION N/A 11/20/2014   Procedure: ANTERIOR CERVICAL DECOMPRESSION/DISCECTOMY FUSION 2 LEVELS;  Surgeon: Phylliss Bob, MD;  Location:  [facility];  Service: Orthopedics;  Laterality: N/A;  Anterior cervical decompression fusion, cervical 3-4, cervical 4-5 with instrumentation and allograft  . arm surgery Bilateral    transposed ulner nerve 10 (dr. Amedeo Plenty)  . BACK SURGERY  2009   Dr Jeralyn Bennett SURGERY  [id number]   Dr Saintclair Halsted  . carpal tunnell  2006   bilateral  . CHOLECYSTECTOMY    . KNEE SURGERY  9-11   right,  scope;  left knee  . NECK SURGERY  05/2008   Dr Saintclair Halsted  . OOPHORECTOMY Bilateral   . PARTIAL KNEE ARTHROPLASTY  01/17/2012   LEFT KNEE  . PARTIAL KNEE ARTHROPLASTY  01/17/2012   Procedure: UNICOMPARTMENTAL KNEE;  Surgeon: Johnny Bridge, MD;  Location: [facility];  Service: Orthopedics;  Laterality: Left;  . RHINOPLASTY  1980  . SHOULDER SURGERY Bilateral    x 2/Dr. Collier Salina  . TONSILLECTOMY       Family History: Family History  Problem Relation Age of Onset  . Prostate cancer Father   . Diabetes Sister   . Heart disease Sister        sister and brothers x 2  . Kidney disease Sister   . Kidney disease Brother   . Heart disease Brother   . Stomach cancer Maternal Aunt   . Colon cancer Neg Hx   . Breast cancer Neg Hx   . Esophageal cancer Neg Hx   . Pancreatic cancer Neg Hx     Social History: Social History   Socioeconomic History  . Marital status: Married    Spouse name: Rebecca [name].  . Number of children: 4  . Years of education: Not on file  . Highest education level: Not on file  Occupational History  . Occupation: stay home   Tobacco Use  . Smoking status: Never Smoker  . Smokeless tobacco: Never Used  Substance and Sexual Activity  . Alcohol use: Yes    Alcohol/week: 0.0 standard drinks    Comment: rare  . Drug use: No  . Sexual activity: Not Currently  Other Topics Concern  . Not on file  Social History Narrative   Husband has cancer    1 w/ autism and cerebral palsy lives w/ them   Social Determinants of Health   Financial Resource Strain:   . Difficulty of Paying Living Expenses:   Food Insecurity:   . Worried About Charity fundraiser in the Last Year:   . Arboriculturist in the Last Year:   Transportation Needs:   . Film/video editor (Medical):   Marland Kitchen Lack of Transportation (Non-Medical):   Physical Activity:   . Days of Exercise per Week:   . Minutes of Exercise per Session:   Stress:   . Feeling of Stress :   Social Connections:   . Frequency of  Communication with Friends and Family:   . Frequency of Social Gatherings with Friends and Family:   . Attends Religious Services:   . Active Member of Clubs or Organizations:   . Attends Archivist Meetings:   Marland Kitchen Marital Status:     Allergies: Allergies  Allergen Reactions  . Tape Other (See Comments)    White adhesive tape -blisters  . Alendronate Sodium Other (See Comments)    REACTION: jaw pain  . Cortizone-10 [Hydrocortisone] Other (See Comments)    Pt received cortizone shot, and became very flushed, and hot, blisters, raw areas around lips & ears   . Oxycodone-Acetaminophen  Hives  . Penicillins Swelling and Rash    Outpatient Meds: Current Outpatient Medications  Medication Sig Dispense Refill  . acetaminophen (TYLENOL) 500 MG tablet Take 500 mg by mouth every 6 (six) hours as needed.    Marland Kitchen b complex vitamins tablet Take 1 tablet by mouth daily.    . Calcium-Magnesium-Vitamin D (CALCIUM 1200+D3 PO) Take 1 tablet by mouth daily.     . clonazePAM (KLONOPIN) 0.5 MG tablet TAKE 1 TABLET(0.5 MG) BY MOUTH THREE TIMES DAILY AS NEEDED FOR ANXIETY 270 tablet 0  . Cyanocobalamin (VITAMIN B-12 PO) Take 1 tablet by mouth daily.    Marland Kitchen escitalopram (LEXAPRO) 5 MG tablet Take 1 tablet (5 mg total) by mouth daily. 90 tablet 2  . meloxicam (MOBIC) 7.5 MG tablet Take 1 tablet (7.5 mg total) by mouth daily. 90 tablet 3  . Multiple Vitamin (MULTIVITAMIN) tablet Take 1 tablet by mouth daily. Reported on 08/25/2015    . simvastatin (ZOCOR) 40 MG tablet Take 1 tablet (40 mg total) by mouth at bedtime. 90 tablet 1   No current facility-administered medications for this visit.   Aspirin recently discontinued after chronic care management pharmacy visit   ___________________________________________________________________ Objective   Exam:  BP (!) 164/80 (BP Location: Left Arm, Patient Position: Sitting, Cuff Size: Large)   Pulse 66   Temp (!) 97.2 F (36.2 C)   Ht 5' (1.524 m)    Wt 167 lb (75.8 kg)   SpO2 99%   BMI 32.61 kg/m    General: Well-appearing, pleasant and conversational, good muscle mass  Eyes: sclera anicteric, no redness  ENT: oral mucosa moist without lesions, no cervical or supraclavicular lymphadenopathy  CV: RRR without murmur, S1/S2, no JVD, no peripheral edema  Resp: clear to auscultation bilaterally, normal RR and effort noted  GI: soft, no tenderness, with active bowel sounds. No guarding or palpable organomegaly noted.  Skin; warm and dry, no rash or jaundice noted  Neuro: awake, alert and oriented x 3. Normal gross motor function and fluent speech  Data:  CBC Latest Ref Rng & Units 01/08/2019 09/07/2018 07/24/2017  WBC 4.0 - 10.5 K/uL 3.7(L) 3.6(L) 4.2  Hemoglobin 12.0 - 15.0 g/dL 12.7 12.5 12.9  Hematocrit 36.0 - 46.0 % 37.7 37.7 38.0  Platelets 150.0 - 400.0 K/uL 115.0(L) 103.0(L) 128.0(L)   CMP Latest Ref Rng & Units 09/07/2018 07/24/2017 05/18/2016  Glucose 70 - 99 mg/dL 84 96 91  BUN 6 - 23 mg/dL 28(H) 22 23  Creatinine 0.40 - 1.20 mg/dL 0.99 0.97 1.14  Sodium 135 - 145 mEq/L 143 141 143  Potassium 3.5 - 5.1 mEq/L 4.0 4.6 5.1  Chloride 96 - 112 mEq/L 109 107 112  CO2 19 - 32 mEq/L 27 28 25   Calcium 8.4 - 10.5 mg/dL 8.7 9.5 9.8  Total Protein 6.0 - 8.3 g/dL 6.1 7.2 -  Total Bilirubin 0.2 - 1.2 mg/dL 0.6 0.6 -  Alkaline Phos 39 - 117 U/L 45 57 -  AST 0 - 37 U/L 25 32 30  ALT 0 - 35 U/L 19 23 35   (Not clear from chart review what is believed to be the cause of her thrombocytopenia and mild leukopenia) -no evidence of cirrhosis or portal hypertension on 2017 CT scan  Assessment: Encounter Diagnoses  Name Primary?  . Non-ulcer dyspepsia Yes  . Altered bowel habits     She has ongoing nonulcer dyspeptic symptoms, I think they are benign and would not recommend repeat upper endoscopy.  Doubt  PPI or other acid suppression therapy would be of much benefit.  Her altered bowel habits also sound like some mild IBS.  I think  prescription antispasmodic therapy may cause more side effects or potential trouble than benefit at her age. Recommended some use of IBgard or peppermint essential oil.  When I mention that, she recalled having recently consumed some peppermint candies that gave almost immediate relief of her epigastric discomfort. I reassured her, I do not think further testing is needed at this point.  We will be glad to see her as needed.  Thank you for the courtesy of this consult.  Please call me with any questions or concerns.  Nelida Meuse III  CC: Referring provider noted above

## 2019-07-03 DIAGNOSIS — M65351 Trigger finger, right little finger: Secondary | ICD-10-CM | POA: Diagnosis not present

## 2019-07-03 DIAGNOSIS — M1711 Unilateral primary osteoarthritis, right knee: Secondary | ICD-10-CM | POA: Diagnosis not present

## 2019-08-14 DIAGNOSIS — M1711 Unilateral primary osteoarthritis, right knee: Secondary | ICD-10-CM | POA: Diagnosis not present

## 2019-08-22 DIAGNOSIS — M65351 Trigger finger, right little finger: Secondary | ICD-10-CM | POA: Diagnosis not present

## 2019-09-02 DIAGNOSIS — M65351 Trigger finger, right little finger: Secondary | ICD-10-CM | POA: Diagnosis not present

## 2019-09-10 ENCOUNTER — Encounter: Payer: Self-pay | Admitting: Internal Medicine

## 2019-09-10 ENCOUNTER — Ambulatory Visit (HOSPITAL_BASED_OUTPATIENT_CLINIC_OR_DEPARTMENT_OTHER)
Admission: RE | Admit: 2019-09-10 | Discharge: 2019-09-10 | Disposition: A | Discharge status: Home / Self Care | Payer: PPO | Source: Ambulatory Visit | Attending: Internal Medicine | Admitting: Internal Medicine

## 2019-09-10 ENCOUNTER — Other Ambulatory Visit: Payer: Self-pay

## 2019-09-10 ENCOUNTER — Ambulatory Visit (INDEPENDENT_AMBULATORY_CARE_PROVIDER_SITE_OTHER): Payer: PPO | Admitting: Internal Medicine

## 2019-09-10 VITALS — BP 147/82 | HR 72 | Temp 96.6°F | Resp 18 | Ht 60.0 in | Wt 172.2 lb

## 2019-09-10 DIAGNOSIS — Z780 Asymptomatic menopausal state: Secondary | ICD-10-CM

## 2019-09-10 DIAGNOSIS — Z0000 Encounter for general adult medical examination without abnormal findings: Secondary | ICD-10-CM

## 2019-09-10 DIAGNOSIS — M85852 Other specified disorders of bone density and structure, left thigh: Secondary | ICD-10-CM | POA: Diagnosis not present

## 2019-09-10 DIAGNOSIS — F329 Major depressive disorder, single episode, unspecified: Secondary | ICD-10-CM | POA: Diagnosis not present

## 2019-09-10 DIAGNOSIS — Z79899 Other long term (current) drug therapy: Secondary | ICD-10-CM | POA: Diagnosis not present

## 2019-09-10 DIAGNOSIS — E785 Hyperlipidemia, unspecified: Secondary | ICD-10-CM

## 2019-09-10 DIAGNOSIS — M8580 Other specified disorders of bone density and structure, unspecified site: Secondary | ICD-10-CM | POA: Diagnosis not present

## 2019-09-10 DIAGNOSIS — F32A Depression, unspecified: Secondary | ICD-10-CM

## 2019-09-10 DIAGNOSIS — F419 Anxiety disorder, unspecified: Secondary | ICD-10-CM

## 2019-09-10 DIAGNOSIS — R29890 Loss of height: Secondary | ICD-10-CM | POA: Diagnosis not present

## 2019-09-10 LAB — CBC WITH DIFFERENTIAL/PLATELET
Basophils Absolute: 0 10*3/uL (ref 0.0–0.1)
Basophils Relative: 0.9 % (ref 0.0–3.0)
Eosinophils Absolute: 0.2 10*3/uL (ref 0.0–0.7)
Eosinophils Relative: 4.6 % (ref 0.0–5.0)
HCT: 37.7 % (ref 36.0–46.0)
Hemoglobin: 12.6 g/dL (ref 12.0–15.0)
Lymphocytes Relative: 21.8 % (ref 12.0–46.0)
Lymphs Abs: 1.1 10*3/uL (ref 0.7–4.0)
MCHC: 33.4 g/dL (ref 30.0–36.0)
MCV: 93.1 fl (ref 78.0–100.0)
Monocytes Absolute: 0.5 10*3/uL (ref 0.1–1.0)
Monocytes Relative: 9.4 % (ref 3.0–12.0)
Neutro Abs: 3.1 10*3/uL (ref 1.4–7.7)
Neutrophils Relative %: 63.3 % (ref 43.0–77.0)
Platelets: 111 10*3/uL — ABNORMAL LOW (ref 150.0–400.0)
RBC: 4.05 Mil/uL (ref 3.87–5.11)
RDW: 13.5 % (ref 11.5–15.5)
WBC: 4.9 10*3/uL (ref 4.0–10.5)

## 2019-09-10 LAB — COMPREHENSIVE METABOLIC PANEL
ALT: 25 U/L (ref 0–35)
AST: 24 U/L (ref 0–37)
Albumin: 4.1 g/dL (ref 3.5–5.2)
Alkaline Phosphatase: 55 U/L (ref 39–117)
BUN: 29 mg/dL — ABNORMAL HIGH (ref 6–23)
CO2: 28 mEq/L (ref 19–32)
Calcium: 9.4 mg/dL (ref 8.4–10.5)
Chloride: 106 mEq/L (ref 96–112)
Creatinine, Ser: 1.08 mg/dL (ref 0.40–1.20)
GFR: 48.91 mL/min — ABNORMAL LOW (ref >60.00)
Glucose, Bld: 89 mg/dL (ref 70–99)
Potassium: 4.1 mEq/L (ref 3.5–5.1)
Sodium: 142 mEq/L (ref 135–145)
Total Bilirubin: 0.7 mg/dL (ref 0.2–1.2)
Total Protein: 6.5 g/dL (ref 6.0–8.3)

## 2019-09-10 LAB — LIPID PANEL
Cholesterol: 163 mg/dL (ref 0–200)
HDL: 69.1 mg/dL (ref >39.00)
LDL Cholesterol: 80 mg/dL (ref 0–99)
NonHDL: 94.29
Total CHOL/HDL Ratio: 2
Triglycerides: 72 mg/dL (ref 0.0–149.0)
VLDL: 14.4 mg/dL (ref 0.0–40.0)

## 2019-09-10 NOTE — Progress Notes (Signed)
Subjective:    Patient ID: Rebecca [name], female    DOB: [birth year], 79 y.o.   MRN: [id number]  DOS:  09/10/2019 Type of visit - description: CPX In general feeling well. Her husband has cancer, she is fully dedicated to take care of him. Obviously stressed and sad but overall managing well. On meloxicam, increased dose?.   Review of Systems  Other than above, a 14 point review of systems is negative    Past Medical History:  Diagnosis Date  . Anxiety   . Arthritis    degenerative lumbar spine   . Chronic neck pain   . Closed fracture of base of fifth metatarsal bone 01/2018   Left  . Complication of anesthesia    at one time my blood pressure dropped -lumbar - on bp med at that time  . Depression   . Dysphagia    esophageal stretched 9-14   . History of blood transfusion 2009   post - lumbar fusion   . Hyperlipidemia   . Hypertension    No longer takes med.  . Osteoarthritis of left shoulder region    Mild, Dr. Mardelle Matte  . Osteoporosis     Past Surgical History:  Procedure Laterality Date  . ABDOMINAL HYSTERECTOMY    . ANTERIOR CERVICAL DECOMP/DISCECTOMY FUSION N/A 11/20/2014   Procedure: ANTERIOR CERVICAL DECOMPRESSION/DISCECTOMY FUSION 2 LEVELS;  Surgeon: Phylliss Bob, MD;  Location: [facility];  Service: Orthopedics;  Laterality: N/A;  Anterior cervical decompression fusion, cervical 3-4, cervical 4-5 with instrumentation and allograft  . arm surgery Bilateral    transposed ulner nerve 10 (dr. Amedeo Plenty)  . BACK SURGERY  2009   Dr Jeralyn Bennett SURGERY  [id number]   Dr Saintclair Halsted  . carpal tunnell  2006   bilateral  . CHOLECYSTECTOMY    . KNEE SURGERY  9-11   right,  scope; left knee  . NECK SURGERY  05/2008   Dr Saintclair Halsted  . OOPHORECTOMY Bilateral   . PARTIAL KNEE ARTHROPLASTY  01/17/2012   LEFT KNEE  . PARTIAL KNEE ARTHROPLASTY  01/17/2012   Procedure: UNICOMPARTMENTAL KNEE;  Surgeon: Johnny Bridge, MD;  Location: [facility];  Service: Orthopedics;  Laterality: Left;  .  RHINOPLASTY  1980  . SHOULDER SURGERY Bilateral    x 2/Dr. Collier Salina  . TONSILLECTOMY      Allergies as of 09/10/2019      Reactions   Tape Other (See Comments)   White adhesive tape -blisters   Alendronate Sodium Other (See Comments)   REACTION: jaw pain   Cortizone-10 [hydrocortisone] Other (See Comments)   Pt received cortizone shot, and became very flushed, and hot, blisters, raw areas around lips & ears    Oxycodone-acetaminophen Hives   Penicillins Swelling, Rash      Medication List       Accurate as of September 10, 2019 11:59 PM. If you have any questions, ask your nurse or doctor.        acetaminophen 500 MG tablet Commonly known as: TYLENOL Take 500 mg by mouth every 6 (six) hours as needed.   b complex vitamins tablet Take 1 tablet by mouth daily.   CALCIUM 1200+D3 PO Take 1 tablet by mouth daily.   clonazePAM 0.5 MG tablet Commonly known as: KLONOPIN TAKE 1 TABLET(0.5 MG) BY MOUTH THREE TIMES DAILY AS NEEDED FOR ANXIETY   escitalopram 5 MG tablet Commonly known as: LEXAPRO Take 1 tablet (5 mg total) by mouth daily.   meloxicam 7.5  MG tablet Commonly known as: MOBIC Take 1 tablet (7.5 mg total) by mouth daily.   multivitamin tablet Take 1 tablet by mouth daily. Reported on 08/25/2015   simvastatin 40 MG tablet Commonly known as: ZOCOR Take 1 tablet (40 mg total) by mouth at bedtime.   VITAMIN B-12 PO Take 1 tablet by mouth daily.          Objective:   Physical Exam BP (!) 147/82 (BP Location: Left Arm, Patient Position: Sitting, Cuff Size: Small)   Pulse 72   Temp (!) 96.6 F (35.9 C) (Temporal)   Resp 18   Ht 5' (1.524 m)   Wt 172 lb 4 oz (78.1 kg)   SpO2 98%   BMI 33.64 kg/m  General: Well developed, NAD, BMI noted Neck: No  thyromegaly  HEENT:  Normocephalic . Face symmetric, atraumatic Lungs:  CTA B Normal respiratory effort, no intercostal retractions, no accessory muscle use. Heart: RRR,  no murmur.  Abdomen:  Not  distended, soft, non-tender. No rebound or rigidity.  Palpable, nontender aorta. Lower extremities: no pretibial edema bilaterally  Skin: Exposed areas without rash. Not pale. Not jaundice Neurologic:  alert & oriented X3.  Speech normal, gait appropriate for age and unassisted Strength symmetric and appropriate for age.  Psych: Cognition and judgment appear intact.  Cooperative with normal attention span and concentration.  Behavior appropriate. No anxious or depressed appearing.     Assessment    Assessment  HTN -- on no med since ~ 2009 Hyperlipidemia Anxiety depression: onset Fall 2016 d/t husband health, lost sister 03-2015. rx lexapro 02-2015 GI --GERD --Esophageal stricture stretched 2014  --EGD/2017--->  gastritis, bx---reactive gastropathy, no H. pylori MSK: --DJD; Chronic neck pain ;  spinal stenosis;  multiple surgeries Osteoporosis DEXA osteoprosis 2006 (per pt) DEXA 11-08 normal  DEXA 9-12 mild osteopenia, rec exercise, ca and vit D T score 05-2015:  (-) 1.4 T score 06/19/2017:  (-) 2.1, RX  vit d, exercise , recheck in 2 years Ao [place] 03-2015 - no AAA  PLAN: Here for CPX HTN: Reports normal ambulatory BPs, on no medications. High cholesterol: On simvastatin, checking labs Anxiety depression: Currently well controlled, on Lexapro daily.  Typically has 1 clonazepam at night and occasionally have clonazepam during the daytime.  Check a UDS. DJD: On meloxicam daily,   increase  dose?  Mostly having pain on her feet and right knee.  Recommend to continue with same dose of meloxicam but also take Tylenol as needed.  See AVS. Osteoporosis: Check a DEXA RTC 1 year   This visit occurred during the SARS-CoV-2 public health emergency.  Safety protocols were in place, including screening questions prior to the visit, additional usage of staff PPE, and extensive cleaning of exam room while observing appropriate contact time as indicated for disinfecting solutions.

## 2019-09-10 NOTE — Progress Notes (Signed)
Pre visit review using our clinic review tool, if applicable. No additional management support is needed unless otherwise documented below in the visit note. 

## 2019-09-10 NOTE — Patient Instructions (Addendum)
Please schedule Medicare Wellness with Glenard Haring.   COVID-19 Vaccine Information can be found at: [email or web address] For questions related to vaccine distribution or appointments, please email [email or web address] or call [phone].   Continue checking your blood pressure  BP GOAL is between 110/65 and  135/85. If it is consistently higher or lower, let me know  For pain: Continue with meloxicam once daily with food Remember you can also take Tylenol  500 mg OTC 2 tabs a day every 8 hours as needed for pain    GO TO THE LAB : Get the blood work     [place], [place] back for physical exam in 1 year   STOP BY THE FIRST FLOOR: Schedule your bone density test

## 2019-09-11 ENCOUNTER — Encounter: Payer: Self-pay | Admitting: Internal Medicine

## 2019-09-11 NOTE — Assessment & Plan Note (Signed)
Here for CPX HTN: Reports normal ambulatory BPs, on no medications. High cholesterol: On simvastatin, checking labs Anxiety depression: Currently well controlled, on Lexapro daily.  Typically has 1 clonazepam at night and occasionally have clonazepam during the daytime.  Check a UDS. DJD: On meloxicam daily,   increase  dose?  Mostly having pain on her feet and right knee.  Recommend to continue with same dose of meloxicam but also take Tylenol as needed.  See AVS. Osteoporosis: Check a DEXA RTC 1 year

## 2019-09-11 NOTE — Assessment & Plan Note (Signed)
---  Td:  05-2016; pnm 23: 2008, 07/2017; prevnar 2015; zostavax:  12-2009; shingrix discussed before - covid shot d/w pt: pro>> cons  - rec flu shot q year  ---CCS:   normal  Cscope  2003, Cscope 2-14 no polyps ---PAPs no longer indicated  ---Breast ca screening:  (-) FH, MMG 09/2018 per kpn -- (+) FH CAD, pt asx, on statins ---Palpable aorta: [place] Ao 03-2015 wnl --Diet and exercise discussed

## 2019-09-12 ENCOUNTER — Other Ambulatory Visit: Payer: Self-pay | Admitting: Internal Medicine

## 2019-09-12 DIAGNOSIS — Z1231 Encounter for screening mammogram for malignant neoplasm of breast: Secondary | ICD-10-CM

## 2019-09-12 LAB — DRUG MONITORING, PANEL 8 WITH CONFIRMATION, URINE
6 Acetylmorphine: NEGATIVE ng/mL (ref <10)
Alcohol Metabolites: NEGATIVE ng/mL
Alphahydroxyalprazolam: NEGATIVE ng/mL (ref <25)
Alphahydroxymidazolam: NEGATIVE ng/mL (ref <50)
Alphahydroxytriazolam: NEGATIVE ng/mL (ref <50)
Aminoclonazepam: 405 ng/mL — ABNORMAL HIGH (ref <25)
Amphetamines: NEGATIVE ng/mL (ref <500)
Benzodiazepines: POSITIVE ng/mL — AB (ref <100)
Buprenorphine, Urine: NEGATIVE ng/mL (ref <5)
Cocaine Metabolite: NEGATIVE ng/mL (ref <150)
Creatinine: 131 mg/dL
Hydroxyethylflurazepam: NEGATIVE ng/mL (ref <50)
Lorazepam: NEGATIVE ng/mL (ref <50)
MDMA: NEGATIVE ng/mL (ref <500)
Marijuana Metabolite: NEGATIVE ng/mL (ref <20)
Nordiazepam: NEGATIVE ng/mL (ref <50)
Opiates: NEGATIVE ng/mL (ref <100)
Oxazepam: NEGATIVE ng/mL (ref <50)
Oxidant: NEGATIVE ug/mL
Oxycodone: NEGATIVE ng/mL (ref <100)
Temazepam: NEGATIVE ng/mL (ref <50)
pH: 5.6 (ref 4.5–9.0)

## 2019-09-12 LAB — DM TEMPLATE

## 2019-09-24 ENCOUNTER — Telehealth: Payer: Self-pay | Admitting: Internal Medicine

## 2019-09-24 NOTE — Telephone Encounter (Signed)
Clonazepam refill.   Last OV: 09/10/2019 Last Fill: 11/13/2018 #270 and 0RF Pt sig: 1 tab tid prn UDS: 09/10/2019 Low risk

## 2019-09-24 NOTE — Telephone Encounter (Signed)
PDMP okay, Rx sent 

## 2019-09-30 DIAGNOSIS — M1811 Unilateral primary osteoarthritis of first carpometacarpal joint, right hand: Secondary | ICD-10-CM | POA: Diagnosis not present

## 2019-09-30 DIAGNOSIS — M65351 Trigger finger, right little finger: Secondary | ICD-10-CM | POA: Diagnosis not present

## 2019-10-01 ENCOUNTER — Encounter (HOSPITAL_BASED_OUTPATIENT_CLINIC_OR_DEPARTMENT_OTHER): Payer: Self-pay

## 2019-10-01 ENCOUNTER — Other Ambulatory Visit: Payer: Self-pay

## 2019-10-01 ENCOUNTER — Ambulatory Visit (HOSPITAL_BASED_OUTPATIENT_CLINIC_OR_DEPARTMENT_OTHER)
Admission: RE | Admit: 2019-10-01 | Discharge: 2019-10-01 | Disposition: A | Discharge status: Home / Self Care | Payer: PPO | Source: Ambulatory Visit | Attending: Internal Medicine | Admitting: Internal Medicine

## 2019-10-01 DIAGNOSIS — Z1231 Encounter for screening mammogram for malignant neoplasm of breast: Secondary | ICD-10-CM | POA: Diagnosis not present

## 2019-10-08 ENCOUNTER — Telehealth: Payer: Self-pay | Admitting: Internal Medicine

## 2019-10-08 NOTE — Progress Notes (Signed)
  Chronic Care Management   Note  10/08/2019 Name: Rebecca [name] MRN: [id number] DOB: Jan 08, 1941  Rebecca [name] is a 79 y.o. year old female who is a primary care patient of Colon Branch, MD. I reached out to [facility] by phone today in response to a referral sent by Rebecca [name] PCP, Colon Branch, MD.   Rebecca [name] was given information about Chronic Care Management services today including:  1. CCM service includes personalized support from designated clinical staff supervised by her physician, including individualized plan of care and coordination with other care providers 2. 24/7 contact phone numbers for assistance for urgent and routine care needs. 3. Service will only be billed when office clinical staff spend 20 minutes or more in a month to coordinate care. 4. Only one practitioner may furnish and bill the service in a calendar month. 5. The patient may stop CCM services at any time (effective at the end of the month) by phone call to the office staff.   Patient wishes to consider information provided and/or speak with a member of the care team before deciding about enrollment in care management services.   Follow up plan:   Carley Perdue [employer] Scheduler

## 2019-10-28 ENCOUNTER — Other Ambulatory Visit: Payer: Self-pay | Admitting: Internal Medicine

## 2019-11-04 ENCOUNTER — Telehealth: Payer: Self-pay | Admitting: Pharmacist

## 2019-11-04 NOTE — Progress Notes (Addendum)
° ° °  Chronic Care Management Pharmacy Assistant   Name: Rebecca [name]  MRN: [id number] DOB: 06-May-1940  Reason for Encounter: Disease State  Patient Questions:  1.  Have you seen any other providers since your last visit? Yes  2.  Any changes in your medicines or health? Yes   PCP : Colon Branch, MD   Their chronic conditions include: HTN, HLD, Osteoarthritis, Anxiety/Depression, Osteopenia.  Office Visits: 09-10-2019 (PCP) Patient presented in the office with Dr. Larose Kells for annual physical exam. Patent reported In general feeling well. Her husband has cancer, she is fully dedicated to take care of him. Dr. Larose Kells noted the patient was stressed and sad but overall managing well. Blood work was ordered and patient advised to schedule bone density test.  Consults;  05-23-2019 Gertie Fey) Patient presented in the office with Dr. Loletha Carrow for epigastric pain. Patient stated she felt like her food was getting stuck. Patient was provided medication samples of Ibgard; quantity 12.   Allergies:   Allergies  Allergen Reactions   Tape Other (See Comments)    White adhesive tape -blisters   Alendronate Sodium Other (See Comments)    REACTION: jaw pain   Cortizone-10 [Hydrocortisone] Other (See Comments)    Pt received cortizone shot, and became very flushed, and hot, blisters, raw areas around lips & ears    Oxycodone-Acetaminophen Hives   Penicillins Swelling and Rash    Medications: Outpatient Encounter Medications as of 11/04/2019  Medication Sig   acetaminophen (TYLENOL) 500 MG tablet Take 500 mg by mouth every 6 (six) hours as needed.   b complex vitamins tablet Take 1 tablet by mouth daily.   Calcium-Magnesium-Vitamin D (CALCIUM 1200+D3 PO) Take 1 tablet by mouth daily.    clonazePAM (KLONOPIN) 0.5 MG tablet TAKE 1 TABLET(0.5 MG) BY MOUTH THREE TIMES DAILY AS NEEDED FOR ANXIETY   Cyanocobalamin (VITAMIN B-12 PO) Take 1 tablet by mouth daily.   escitalopram (LEXAPRO) 5 MG tablet  Take 1 tablet (5 mg total) by mouth daily.   meloxicam (MOBIC) 7.5 MG tablet Take 1 tablet (7.5 mg total) by mouth daily.   Multiple Vitamin (MULTIVITAMIN) tablet Take 1 tablet by mouth daily. Reported on 08/25/2015   simvastatin (ZOCOR) 40 MG tablet Take 1 tablet (40 mg total) by mouth at bedtime.   No facility-administered encounter medications on file as of 11/04/2019.    Current Diagnosis: Patient Active Problem List   Diagnosis Date Noted   Perforation of right tympanic membrane 10/18/2018   TMJ pain dysfunction syndrome 10/18/2018   Anxiety and depression 11/16/2016   PCP NOTES >>>>> 02/15/2015   Radiculopathy 11/20/2014   Spinal stenosis 01/24/2014   Dysphagia, unspecified(787.20) 11/22/2012   Annual physical exam 11/24/2010   Hyperlipidemia 09/25/2006   Essential hypertension 09/25/2006   Osteoarthritis 09/25/2006   NECK PAIN, CHRONIC 09/25/2006    Goals Addressed   None    Patient stated she has stopped taking Asprin 81mg . She was able to get psrescription for escitalopram 5mg  instead of taking one half tabs of 10mg . She has been able to keep her moods stable with gardening and keeping a schedule for herself and her husband. Patient stated she takes Mobic in the morning to manage her pain from osteoarthritis and supplements with Tylenol at night.  Follow-Up:  Scheduled Follow-Up With Clinical Pharmacist   Fanny Skates, [place] Pharmacist Assistant [phone]  Reviewed by: De Blanch, PharmD Clinical Pharmacist [facility] [phone]

## 2019-11-05 ENCOUNTER — Telehealth: Payer: PPO

## 2019-11-06 DIAGNOSIS — H04123 Dry eye syndrome of bilateral lacrimal glands: Secondary | ICD-10-CM | POA: Diagnosis not present

## 2019-11-06 DIAGNOSIS — H43813 Vitreous degeneration, bilateral: Secondary | ICD-10-CM | POA: Diagnosis not present

## 2019-11-06 DIAGNOSIS — D3132 Benign neoplasm of left choroid: Secondary | ICD-10-CM | POA: Diagnosis not present

## 2019-11-06 DIAGNOSIS — H26493 Other secondary cataract, bilateral: Secondary | ICD-10-CM | POA: Diagnosis not present

## 2019-11-06 DIAGNOSIS — Z961 Presence of intraocular lens: Secondary | ICD-10-CM | POA: Diagnosis not present

## 2019-12-11 ENCOUNTER — Telehealth: Payer: PPO

## 2019-12-31 ENCOUNTER — Other Ambulatory Visit: Payer: Self-pay | Admitting: Podiatry

## 2020-02-03 ENCOUNTER — Telehealth: Payer: Self-pay | Admitting: Internal Medicine

## 2020-02-03 ENCOUNTER — Other Ambulatory Visit: Payer: Self-pay | Admitting: Internal Medicine

## 2020-02-03 MED ORDER — PANTOPRAZOLE SODIUM 40 MG PO TBEC
40.0000 mg | DELAYED_RELEASE_TABLET | Freq: Every day | ORAL | 6 refills | Status: DC
Start: 2020-02-03 — End: 2021-01-11

## 2020-02-03 MED ORDER — MELOXICAM 15 MG PO TABS
15.0000 mg | ORAL_TABLET | Freq: Every day | ORAL | 1 refills | Status: DC | PRN
Start: 2020-02-03 — End: 2020-02-03

## 2020-02-03 NOTE — Telephone Encounter (Signed)
PDMP okay, prescription sent 

## 2020-02-03 NOTE — Telephone Encounter (Signed)
Requesting: clonazepam 0.5mg  Contract: 07/24/2017 UDS: 09/10/2019 Last Visit: 09/10/2019 Next Visit: 09/10/2020 Last Refill: 09/24/2019 #270 and 0RF Pt sig: 1 tab tid prn  Please Advise

## 2020-02-03 NOTE — Telephone Encounter (Signed)
Please advise 

## 2020-02-03 NOTE — Telephone Encounter (Signed)
Advise patient: If she is already taking meloxicam 7.5 mg daily and Tylenol 3 times a day and the pain is not well controlled we can do the following: --Increase meloxicam to 15 mg daily, as needed, take it with the largest meal of the day. --Start pantoprazole 40 mg every morning before breakfast, send a 50-month supply.  Reason for PPIs: protect her stomach from higher dose of meloxicam. Alternatively, she could reschedule a visit to discuss the plan

## 2020-02-03 NOTE — Telephone Encounter (Signed)
Patient would like to know if you could increase Mobic to one tablet in the morning and one in the afternoon. Quantity 180  [facility] [place], [address]  [place], [place] [address]  Phone:  [phone] Fax:  [phone]

## 2020-02-03 NOTE — Telephone Encounter (Signed)
Spoke w/ Pt- informed of recommendations. Pt verbalized understanding. Rx's sent to [facility].

## 2020-02-07 ENCOUNTER — Other Ambulatory Visit: Payer: Self-pay | Admitting: Internal Medicine

## 2020-03-18 DIAGNOSIS — M1711 Unilateral primary osteoarthritis, right knee: Secondary | ICD-10-CM | POA: Diagnosis not present

## 2020-03-18 DIAGNOSIS — M25562 Pain in left knee: Secondary | ICD-10-CM | POA: Diagnosis not present

## 2020-03-19 ENCOUNTER — Telehealth: Payer: Self-pay

## 2020-03-19 NOTE — Telephone Encounter (Signed)
Received surgical clearance form from [facility]. Pt is scheduled for R uni knee arthroplasty w/ Dr. Teryl Lucy on 04/21/2020.   Mitzo- please schedule surgical clearance appt at her earliest convenience. TY.

## 2020-03-24 NOTE — Telephone Encounter (Signed)
Appt scheduled 04/01/20.

## 2020-04-01 ENCOUNTER — Encounter: Payer: Self-pay | Admitting: Internal Medicine

## 2020-04-01 ENCOUNTER — Ambulatory Visit (INDEPENDENT_AMBULATORY_CARE_PROVIDER_SITE_OTHER): Payer: Medicare Other | Admitting: Internal Medicine

## 2020-04-01 ENCOUNTER — Other Ambulatory Visit: Payer: Self-pay

## 2020-04-01 VITALS — BP 140/86 | HR 83 | Temp 98.1°F | Ht 60.0 in | Wt 157.0 lb

## 2020-04-01 DIAGNOSIS — M170 Bilateral primary osteoarthritis of knee: Secondary | ICD-10-CM | POA: Diagnosis not present

## 2020-04-01 DIAGNOSIS — Z23 Encounter for immunization: Secondary | ICD-10-CM

## 2020-04-01 DIAGNOSIS — Z01818 Encounter for other preprocedural examination: Secondary | ICD-10-CM | POA: Diagnosis not present

## 2020-04-01 DIAGNOSIS — I10 Essential (primary) hypertension: Secondary | ICD-10-CM

## 2020-04-01 NOTE — Patient Instructions (Addendum)
Recommend to stop ibuprofen  Tylenol  500 mg OTC 2 tabs a day every 8 hours as needed for pain  ICE  Voltaren gel over-the-counter to the knee 3 times a day  GO TO THE LAB : Get the blood work

## 2020-04-01 NOTE — Progress Notes (Unsigned)
Subjective:    Patient ID: Rebecca [name], female    DOB: 25-Jan-1941, 80 y.o.   MRN: [id number]  DOS:  04/01/2020 Type of visit - description: Surgical clearance here with her son. Patient is scheduled to have right knee replacement. She is doing well, able to do her ADLs without chest pain or difficulty breathing.  No edema.  No palpitations.  She recently lost her husband to cancer, she seems to be grieving appropriately. Until recently,  was able to go up and down the stairs several times a day without problems.  Limiting factor at this point is knee pain.   Review of Systems See above   Past Medical History:  Diagnosis Date  . Anxiety   . Arthritis    degenerative lumbar spine   . Chronic neck pain   . Closed fracture of base of fifth metatarsal bone 01/2018   Left  . Complication of anesthesia    at one time my blood pressure dropped -lumbar - on bp med at that time  . Depression   . Dysphagia    esophageal stretched 9-14   . History of blood transfusion 2009   post - lumbar fusion   . Hyperlipidemia   . Hypertension    No longer takes med.  . Osteoarthritis of left shoulder region    Mild, Dr. Mardelle Matte  . Osteoporosis     Past Surgical History:  Procedure Laterality Date  . ABDOMINAL HYSTERECTOMY    . ANTERIOR CERVICAL DECOMP/DISCECTOMY FUSION N/A 11/20/2014   Procedure: ANTERIOR CERVICAL DECOMPRESSION/DISCECTOMY FUSION 2 LEVELS;  Surgeon: Phylliss Bob, MD;  Location: [place];  Service: Orthopedics;  Laterality: N/A;  Anterior cervical decompression fusion, cervical 3-4, cervical 4-5 with instrumentation and allograft  . arm surgery Bilateral    transposed ulner nerve 10 (dr. Amedeo Plenty)  . BACK SURGERY  2009   Dr Jeralyn Bennett SURGERY  [phone]   Dr Saintclair Halsted  . carpal tunnell  2006   bilateral  . CHOLECYSTECTOMY    . KNEE SURGERY  9-11   right,  scope; left knee  . NECK SURGERY  05/2008   Dr Saintclair Halsted  . OOPHORECTOMY Bilateral   . PARTIAL KNEE ARTHROPLASTY  01/17/2012    LEFT KNEE  . PARTIAL KNEE ARTHROPLASTY  01/17/2012   Procedure: UNICOMPARTMENTAL KNEE;  Surgeon: Johnny Bridge, MD;  Location: [place];  Service: Orthopedics;  Laterality: Left;  . RHINOPLASTY  1980  . SHOULDER SURGERY Bilateral    x 2/Dr. Collier Salina  . TONSILLECTOMY      Allergies as of 04/01/2020      Reactions   Tape Other (See Comments)   White adhesive tape -blisters   Alendronate Sodium Other (See Comments)   REACTION: jaw pain   Cortizone-10 [hydrocortisone] Other (See Comments)   Pt received cortizone shot, and became very flushed, and hot, blisters, raw areas around lips & ears    Oxycodone-acetaminophen Hives   Penicillins Swelling, Rash      Medication List       Accurate as of April 01, 2020 11:59 PM. If you have any questions, ask your nurse or doctor.        STOP taking these medications   ibuprofen 200 MG tablet Commonly known as: ADVIL Stopped by: Kathlene November, MD   meloxicam 15 MG tablet Commonly known as: [place] by: Kathlene November, MD     TAKE these medications   acetaminophen 500 MG tablet Commonly known as: TYLENOL Take 1,000  mg by mouth in the morning and at bedtime.   clonazePAM 0.5 MG tablet Commonly known as: KLONOPIN TAKE 1 TABLET(0.5 MG) BY MOUTH THREE TIMES DAILY AS NEEDED FOR ANXIETY What changed: See the new instructions.   escitalopram 5 MG tablet Commonly known as: LEXAPRO TAKE 1 TABLET(5 MG) BY MOUTH DAILY What changed: See the new instructions.   multivitamin tablet Take 1 tablet by mouth daily. Reported on 08/25/2015   pantoprazole 40 MG tablet Commonly known as: PROTONIX Take 1 tablet (40 mg total) by mouth daily before breakfast.   simvastatin 40 MG tablet Commonly known as: ZOCOR Take 1 tablet (40 mg total) by mouth at bedtime.   Systane Balance 0.6 % Soln Generic drug: Propylene Glycol Place 1 drop into both eyes daily.   vitamin C 500 MG tablet Commonly known as: ASCORBIC ACID Take 500 mg by mouth daily.    zinc gluconate 50 MG tablet Take 50 mg by mouth daily.          Objective:   Physical Exam BP 140/86 (BP Location: Right Arm, Patient Position: Sitting, Cuff Size: Large)   Pulse 83   Temp 98.1 F (36.7 C) (Oral)   Ht 5' (1.524 m)   Wt 157 lb (71.2 kg)   SpO2 98%   BMI 30.66 kg/m  General:   Well developed, NAD, BMI noted.  HEENT:  Normocephalic . Face symmetric, atraumatic Neck: No  JVD at 45 degrees Lungs:  CTA B Normal respiratory effort, no intercostal retractions, no accessory muscle use. Heart: RRR,  no murmur.  Abdomen:  Not distended, soft, non-tender. No rebound or rigidity.   Skin: Not pale. Not jaundice Lower extremities: no pretibial edema bilaterally  Neurologic:  alert & oriented X3.  Speech normal, gait limited by knee pain Psych--  Cognition and judgment appear intact.  Cooperative with normal attention span and concentration.  Behavior appropriate. No anxious or depressed appearing.     Assessment      Assessment  HTN -- on no med since ~ 2009 Hyperlipidemia Anxiety depression: onset Fall 2016 d/t husband health, lost sister 03-2015. rx lexapro 02-2015 GI --GERD --Esophageal stricture stretched 2014  --EGD/2017--->  gastritis, bx---reactive gastropathy, no H. pylori MSK: --DJD; Chronic neck pain ;  spinal stenosis;  multiple surgeries Osteoporosis DEXA osteoprosis 2006 (per pt) DEXA 11-08 normal  DEXA 9-12 mild osteopenia, rec exercise, ca and vit D T score 05-2015:  (-) 1.4 T score 06/19/2017:  (-) 2.1, RX  vit d, exercise , recheck in 2 years Ao [place] 03-2015 - no AAA  PLAN:  Surgical clearance: Patient is 80 y/o, no known history of CAD, no cardiopulmonary symptoms, able to do all her ADLs with the only limiting factor being knee pain.   . EKG: NSR, some ventricular ectopy, we are checking electrolytes and magnesium.  (EKG discussed with cardiology, ventricular ectopy does not prevent her from having surgery; at some point after  surgery she could be seen by cardiology) Patient is clear from the medical and cardiac standpoint. Did recommend to stop NSAIDs, pain control at night with Tylenol, ice, topical NSAIDs.   HTN: History of, on no meds, check CMP, CBC, magnesium Preventive care: Flu shot today RTC as recommended by 08/2020 CPX This visit occurred during the SARS-CoV-2 public health emergency.  Safety protocols were in place, including screening questions prior to the visit, additional usage of staff PPE, and extensive cleaning of exam room while observing appropriate contact time as indicated for disinfecting solutions.

## 2020-04-02 LAB — COMPREHENSIVE METABOLIC PANEL
ALT: 9 U/L (ref 0–35)
AST: 14 U/L (ref 0–37)
Albumin: 4.1 g/dL (ref 3.5–5.2)
Alkaline Phosphatase: 76 U/L (ref 39–117)
BUN: 28 mg/dL — ABNORMAL HIGH (ref 6–23)
CO2: 24 mEq/L (ref 19–32)
Calcium: 9.7 mg/dL (ref 8.4–10.5)
Chloride: 106 mEq/L (ref 96–112)
Creatinine, Ser: 0.83 mg/dL (ref 0.40–1.20)
GFR: 66.86 mL/min (ref >60.00)
Glucose, Bld: 98 mg/dL (ref 70–99)
Potassium: 3.5 mEq/L (ref 3.5–5.1)
Sodium: 141 mEq/L (ref 135–145)
Total Bilirubin: 0.5 mg/dL (ref 0.2–1.2)
Total Protein: 7 g/dL (ref 6.0–8.3)

## 2020-04-02 LAB — CBC WITH DIFFERENTIAL/PLATELET
Basophils Absolute: 0 10*3/uL (ref 0.0–0.1)
Basophils Relative: 0.6 % (ref 0.0–3.0)
Eosinophils Absolute: 0.1 10*3/uL (ref 0.0–0.7)
Eosinophils Relative: 1.2 % (ref 0.0–5.0)
HCT: 37.4 % (ref 36.0–46.0)
Hemoglobin: 12.3 g/dL (ref 12.0–15.0)
Lymphocytes Relative: 20.4 % (ref 12.0–46.0)
Lymphs Abs: 1.2 10*3/uL (ref 0.7–4.0)
MCHC: 33 g/dL (ref 30.0–36.0)
MCV: 91.5 fl (ref 78.0–100.0)
Monocytes Absolute: 0.5 10*3/uL (ref 0.1–1.0)
Monocytes Relative: 9.4 % (ref 3.0–12.0)
Neutro Abs: 3.9 10*3/uL (ref 1.4–7.7)
Neutrophils Relative %: 68.4 % (ref 43.0–77.0)
Platelets: 171 10*3/uL (ref 150.0–400.0)
RBC: 4.09 Mil/uL (ref 3.87–5.11)
RDW: 14.1 % (ref 11.5–15.5)
WBC: 5.7 10*3/uL (ref 4.0–10.5)

## 2020-04-02 LAB — MAGNESIUM: Magnesium: 1.5 mg/dL (ref 1.5–2.5)

## 2020-04-02 NOTE — Assessment & Plan Note (Signed)
Surgical clearance: Patient is 80 y/o, no known history of CAD, no cardiopulmonary symptoms, able to do all her ADLs with the only limiting factor being knee pain.   . EKG: NSR, some ventricular ectopy, we are checking electrolytes and magnesium.  (EKG discussed with cardiology, ventricular ectopy does not prevent her from having surgery; at some point after surgery she could be seen by cardiology) Patient is clear from the medical and cardiac standpoint. Did recommend to stop NSAIDs, pain control at night with Tylenol, ice, topical NSAIDs.   HTN: History of, on no meds, check CMP, CBC, magnesium Preventive care: Flu shot today RTC as recommended by 08/2020 CPX

## 2020-04-02 NOTE — Telephone Encounter (Signed)
Received fax confirmation

## 2020-04-02 NOTE — Telephone Encounter (Signed)
Surgical clearance form completed and faxed to ATTN: Sherri at [phone] w/ OV notes, labs and EKG from 04/02/2020.

## 2020-04-06 ENCOUNTER — Ambulatory Visit: Payer: PPO | Admitting: Internal Medicine

## 2020-04-06 DIAGNOSIS — M1711 Unilateral primary osteoarthritis, right knee: Secondary | ICD-10-CM | POA: Diagnosis present

## 2020-04-08 ENCOUNTER — Telehealth: Payer: Self-pay | Admitting: Pharmacist

## 2020-04-08 NOTE — Patient Instructions (Addendum)
DUE TO COVID-19 ONLY ONE VISITOR IS ALLOWED TO COME WITH YOU AND STAY IN THE WAITING ROOM ONLY DURING PRE OP AND PROCEDURE DAY OF SURGERY. THE 1 VISITOR  MAY VISIT WITH YOU AFTER SURGERY IN YOUR PRIVATE ROOM DURING VISITING HOURS ONLY!  YOU NEED TO HAVE A COVID 19 TEST ON_2/4______ @_1 :10 pm______, THIS TEST MUST BE DONE BEFORE SURGERY,  COVID TESTING SITE [address] [place] [address], IT IS ON THE RIGHT GOING OUT [address] APPROXIMATELY  2 MINUTES PAST [employer] ON THE RIGHT. ONCE YOUR COVID TEST IS COMPLETED,  PLEASE BEGIN THE QUARANTINE INSTRUCTIONS AS OUTLINED IN YOUR HANDOUT.                Rebecca [name] [facility]    Your procedure is scheduled on: 04/21/20   Report to [facility] Main  Entrance   Report to Short stay at 5:30 AM     Call this number if you have problems the morning of surgery [place], [place].   No food after midnight.    You may have clear liquid until 4:30 AM.    At 4:00 AM drink pre surgery drink.   Nothing by mouth after 4:30 AM.   Take these medicines the morning of surgery with A SIP OF WATER: Lexapro, Pantoprazole                                 You may not have any metal on your body including hair pins and              piercings  Do not wear jewelry, make-up, lotions, powders or perfumes, deodorant             Do not wear nail polish on your fingernails.  Do not shave  48 hours prior to surgery.              Do not bring valuables to the hospital. [place].  Contacts, dentures or bridgework may not be worn into surgery.      Patients discharged the day of surgery will not be allowed to drive home.   IF YOU ARE HAVING SURGERY AND GOING HOME THE SAME DAY, YOU MUST HAVE AN ADULT TO DRIVE YOU HOME AND BE WITH YOU FOR 24 HOURS.   YOU MAY GO HOME BY TAXI OR UBER OR  ORTHERWISE, BUT AN ADULT MUST ACCOMPANY YOU HOME AND STAY WITH YOU FOR 24 HOURS.  Name and phone number of your driver:  Special Instructions: N/A              Please read over the following fact sheets you were given: _____________________________________________________________________             [facility] - Preparing for Surgery Before surgery, you can play an important role.   Because skin is not sterile, your skin needs to be as free of germs as possible.   You can reduce the number of germs on your skin by washing with CHG (chlorahexidine gluconate) soap before surgery.   CHG is an antiseptic cleaner which kills germs and bonds with the skin to continue killing germs even after washing. Please DO NOT use if you  have an allergy to CHG or antibacterial soaps.   If your skin becomes reddened/irritated stop using the CHG and inform your nurse when you arrive at Short Stay. Do not shave (including legs and underarms) for at least 48 hours prior to the first CHG shower.    Please follow these instructions carefully:  1.  Shower with CHG Soap the night before surgery and the  morning of Surgery.  2.  If you choose to wash your hair, wash your hair first as usual with your  normal  shampoo.  3.  After you shampoo, rinse your hair and body thoroughly to remove the  shampoo.                                        4.  Use CHG as you would any other liquid soap.  You can apply chg directly  to the skin and wash                       Gently with a scrungie or clean washcloth.  5.  Apply the CHG Soap to your body ONLY FROM THE NECK DOWN.   Do not use on face/ open                           Wound or open sores. Avoid contact with eyes, ears mouth and genitals (private parts).                       Wash face,  Genitals (private parts) with your normal soap.             6.  Wash thoroughly, paying special attention to the area where your surgery  will be performed.  7.  Thoroughly rinse your  body with warm water from the neck down.  8.  DO NOT shower/wash with your normal soap after using and rinsing off  the CHG Soap.             9.  Pat yourself dry with a clean towel.            10.  Wear clean pajamas.            11.  Place clean sheets on your bed the night of your first shower and do not  sleep with pets. Day of Surgery : Do not apply any lotions/deodorants the morning of surgery.  Please wear clean clothes to the hospital/surgery center.  FAILURE TO FOLLOW THESE INSTRUCTIONS MAY RESULT IN THE CANCELLATION OF YOUR SURGERY PATIENT SIGNATURE_________________________________  NURSE SIGNATURE__________________________________  ________________________________________________________________________   Adam Phenix  An incentive spirometer is a tool that can help keep your lungs clear and active. This tool measures how well you are filling your lungs with each breath. Taking long deep breaths may help reverse or decrease the chance of developing breathing (pulmonary) problems (especially infection) following:  A long period of time when you are unable to move or be active. BEFORE THE PROCEDURE   If the spirometer includes an indicator to show your best effort, your nurse or respiratory therapist will set it to a desired goal.  If possible, sit up straight or lean slightly forward. Try not to slouch.  Hold the incentive spirometer in an upright position. INSTRUCTIONS FOR USE  1. Sit on the edge of  your bed if possible, or sit up as far as you can in bed or on a chair. 2. Hold the incentive spirometer in an upright position. 3. Breathe out normally. 4. Place the mouthpiece in your mouth and seal your lips tightly around it. 5. Breathe in slowly and as deeply as possible, raising the piston or the ball toward the top of the column. 6. Hold your breath for 3-5 seconds or for as long as possible. Allow the piston or ball to fall to the bottom of the  column. 7. Remove the mouthpiece from your mouth and breathe out normally. 8. Rest for a few seconds and repeat Steps 1 through 7 at least 10 times every 1-2 hours when you are awake. Take your time and take a few normal breaths between deep breaths. 9. The spirometer may include an indicator to show your best effort. Use the indicator as a goal to work toward during each repetition. 10. After each set of 10 deep breaths, practice coughing to be sure your lungs are clear. If you have an incision (the cut made at the time of surgery), support your incision when coughing by placing a pillow or rolled up towels firmly against it. Once you are able to get out of bed, walk around indoors and cough well. You may stop using the incentive spirometer when instructed by your caregiver.  RISKS AND COMPLICATIONS  Take your time so you do not get dizzy or light-headed.  If you are in pain, you may need to take or ask for pain medication before doing incentive spirometry. It is harder to take a deep breath if you are having pain. AFTER USE  Rest and breathe slowly and easily.  It can be helpful to keep track of a log of your progress. Your caregiver can provide you with a simple table to help with this. If you are using the spirometer at home, follow these instructions: [place] IF:   You are having difficultly using the spirometer.  You have trouble using the spirometer as often as instructed.  Your pain medication is not giving enough relief while using the spirometer.  You develop fever of 100.5 F (38.1 C) or higher. SEEK IMMEDIATE MEDICAL CARE IF:   You cough up bloody sputum that had not been present before.  You develop fever of 102 F (38.9 C) or greater.  You develop worsening pain at or near the incision site. MAKE SURE YOU:   Understand these instructions.  Will watch your condition.  Will get help right away if you are not doing well or get worse. Document Released:  07/11/2006 Document Revised: 05/23/2011 Document Reviewed: 09/11/2006 [facility] Patient Information 2014 [place], [place].   ________________________________________________________________________

## 2020-04-08 NOTE — Progress Notes (Addendum)
Chronic Care Management Pharmacy Assistant   Name: Rebecca [name]  MRN: [id number] DOB: [birth year]  Reason for Encounter: General Disease State Call  Patient Questions:  1.  Have you seen any other providers since your last visit? Yes.    2.  Any changes in your medicines or health? Yes.  PCP : Colon Branch, MD   Their chronic conditions include: HTN, HLD, Osteoarthritis, Anxiety/Depression, Osteopenia.  Office Visits: 04/01/20 Dr. Larose Kells RECOMMEND to stop Ibuprofen and to start Tylenol 500 mg OTC 2 tabs a day every 8 hours as needed for pain.  02/03/20 (Telephone) Dr. Larose Kells INCREASED Meloxicam to 15 mg daily, as needed. STARTED Pantoprazole 40 mg.  Consults: 11/06/19 Ophthalmology Katy Fitch, Darlina Guys. No information given.  Allergies:   Allergies  Allergen Reactions   Tape Other (See Comments)    White adhesive tape -blisters   Alendronate Sodium Other (See Comments)    REACTION: jaw pain   Cortizone-10 [Hydrocortisone] Other (See Comments)    Pt received cortizone shot, and became very flushed, and hot, blisters, raw areas around lips & ears    Oxycodone-Acetaminophen Hives   Penicillins Swelling and Rash    Medications: Outpatient Encounter Medications as of 04/08/2020  Medication Sig   acetaminophen (TYLENOL) 500 MG tablet Take 1,000 mg by mouth in the morning and at bedtime.   clonazePAM (KLONOPIN) 0.5 MG tablet TAKE 1 TABLET(0.5 MG) BY MOUTH THREE TIMES DAILY AS NEEDED FOR ANXIETY (Patient taking differently: Take 0.5 mg by mouth 3 (three) times daily as needed for anxiety.)   escitalopram (LEXAPRO) 5 MG tablet TAKE 1 TABLET(5 MG) BY MOUTH DAILY (Patient taking differently: Take 5 mg by mouth daily.)   Multiple Vitamin (MULTIVITAMIN) tablet Take 1 tablet by mouth daily. Reported on 08/25/2015   pantoprazole (PROTONIX) 40 MG tablet Take 1 tablet (40 mg total) by mouth daily before breakfast.   Propylene Glycol (SYSTANE BALANCE) 0.6 % SOLN Place 1 drop into both eyes  daily.   simvastatin (ZOCOR) 40 MG tablet Take 1 tablet (40 mg total) by mouth at bedtime.   vitamin C (ASCORBIC ACID) 500 MG tablet Take 500 mg by mouth daily.   zinc gluconate 50 MG tablet Take 50 mg by mouth daily.   No facility-administered encounter medications on file as of 04/08/2020.    Current Diagnosis: Patient Active Problem List   Diagnosis Date Noted   Osteoarthritis of right knee 04/06/2020   Perforation of right tympanic membrane 10/18/2018   TMJ pain dysfunction syndrome 10/18/2018   Anxiety and depression 11/16/2016   PCP NOTES >>>>> 02/15/2015   Radiculopathy 11/20/2014   Spinal stenosis 01/24/2014   Dysphagia, unspecified(787.20) 11/22/2012   Annual physical exam 11/24/2010   Hyperlipidemia 09/25/2006   Essential hypertension 09/25/2006   Osteoarthritis 09/25/2006   NECK PAIN, CHRONIC 09/25/2006    Goals Addressed   None    Spoke with the patient about her general health and she stated she was preparing to have surgery on her right knee, she's using a cane to get around at this tine.Patient state she is scheduled for her surgery on 04/21/20. Patient stated she doesn't eat much and doesn't have a good appetite. She stated about 4 days a week she probably eats crackers and juice. Her actively level is not very high because of the pain she is in.She informed me that her husband passed in Oct and her income has decreased since then but at this time she does not have any concerns about  her medications.   Follow-Up:  Pharmacist Review   Charlann Lange, [place] Pharmacist Assistant [phone]  2 minutes spent in review, coordination, and documentation.  Reviewed by: Beverly Milch, PharmD Clinical Pharmacist [facility] [phone]

## 2020-04-09 ENCOUNTER — Other Ambulatory Visit: Payer: Self-pay

## 2020-04-09 ENCOUNTER — Encounter (HOSPITAL_COMMUNITY)
Admission: RE | Admit: 2020-04-09 | Discharge: 2020-04-09 | Disposition: A | Discharge status: Home / Self Care | Payer: Medicare Other | Source: Ambulatory Visit | Attending: Orthopedic Surgery | Admitting: Orthopedic Surgery

## 2020-04-09 ENCOUNTER — Encounter (HOSPITAL_COMMUNITY): Payer: Self-pay

## 2020-04-09 DIAGNOSIS — Z01812 Encounter for preprocedural laboratory examination: Secondary | ICD-10-CM | POA: Diagnosis not present

## 2020-04-09 LAB — BASIC METABOLIC PANEL
Anion gap: 12 (ref 5–15)
BUN: 22 mg/dL (ref 8–23)
CO2: 26 mmol/L (ref 22–32)
Calcium: 9.8 mg/dL (ref 8.9–10.3)
Chloride: 104 mmol/L (ref 98–111)
Creatinine, Ser: 0.85 mg/dL (ref 0.44–1.00)
GFR, Estimated: >60 mL/min (ref >60)
Glucose, Bld: 109 mg/dL — ABNORMAL HIGH (ref 70–99)
Potassium: 3.7 mmol/L (ref 3.5–5.1)
Sodium: 142 mmol/L (ref 135–145)

## 2020-04-09 LAB — CBC
HCT: 36.8 % (ref 36.0–46.0)
Hemoglobin: 11.8 g/dL — ABNORMAL LOW (ref 12.0–15.0)
MCH: 30.3 pg (ref 26.0–34.0)
MCHC: 32.1 g/dL (ref 30.0–36.0)
MCV: 94.4 fL (ref 80.0–100.0)
Platelets: 154 10*3/uL (ref 150–400)
RBC: 3.9 MIL/uL (ref 3.87–5.11)
RDW: 13.9 % (ref 11.5–15.5)
WBC: 5.9 10*3/uL (ref 4.0–10.5)
nRBC: 0 % (ref 0.0–0.2)

## 2020-04-09 LAB — SURGICAL PCR SCREEN
MRSA, PCR: NEGATIVE
Staphylococcus aureus: POSITIVE — AB

## 2020-04-09 NOTE — Progress Notes (Signed)
COVID Vaccine Completed:yes Date COVID Vaccine completed:12/07/19 COVID vaccine manufacturer:   Moderna     PCP - Dr. French Ana Cardiologist - no  Chest x-ray - no EKG - 04/01/20-epic Stress Test -no  ECHO - no Cardiac Cath - no Pacemaker/ICD device last checked:NA  Sleep Study - no CPAP -   Fasting Blood Sugar - NA Checks Blood Sugar _____ times a day  Blood Thinner Instructions:NA Aspirin Instructions: Last Dose:  Anesthesia review:   Patient denies shortness of breath, fever, cough and chest pain at PAT appointment  yes Patient verbalized understanding of instructions that were given to them at the PAT appointment. Patient was also instructed that they will need to review over the PAT instructions again at home before surgery.Yes Pt doesn't climb stairs. She lives with her son who helps her. She reports no SOB with ADLs

## 2020-04-17 ENCOUNTER — Other Ambulatory Visit (HOSPITAL_COMMUNITY)
Admission: RE | Admit: 2020-04-17 | Discharge: 2020-04-17 | Disposition: A | Discharge status: Home / Self Care | Payer: Medicare Other | Source: Ambulatory Visit | Attending: Orthopedic Surgery | Admitting: Orthopedic Surgery

## 2020-04-17 DIAGNOSIS — Z20822 Contact with and (suspected) exposure to covid-19: Secondary | ICD-10-CM | POA: Diagnosis not present

## 2020-04-17 DIAGNOSIS — Z01812 Encounter for preprocedural laboratory examination: Secondary | ICD-10-CM | POA: Diagnosis not present

## 2020-04-17 LAB — SARS CORONAVIRUS 2 (TAT 6-24 HRS): SARS Coronavirus 2: NEGATIVE

## 2020-04-20 NOTE — Anesthesia Preprocedure Evaluation (Addendum)
Anesthesia Evaluation  Patient identified by MRN, date of birth, ID band Patient awake    Reviewed: Allergy & Precautions, NPO status , Patient's Chart, lab work & pertinent test results  Airway Mallampati: I  TM Distance: >3 FB Neck ROM: Full    Dental  (+) Edentulous Upper, Missing, Dental Advisory Given,    Pulmonary neg pulmonary ROS,    Pulmonary exam normal breath sounds clear to auscultation       Cardiovascular hypertension, Normal cardiovascular exam Rhythm:Regular Rate:Normal     Neuro/Psych PSYCHIATRIC DISORDERS Anxiety Depression negative neurological ROS     GI/Hepatic Neg liver ROS, GERD  Medicated and Controlled,  Endo/Other  negative endocrine ROS  Renal/GU negative Renal ROS  negative genitourinary   Musculoskeletal  (+) Arthritis , Osteoarthritis,  DJD R knee One prior back surgery    Abdominal   Peds  Hematology negative hematology ROS (+) hct 36.8, plt 154   Anesthesia Other Findings   Reproductive/Obstetrics negative OB ROS                           Anesthesia Physical Anesthesia Plan  ASA: II  Anesthesia Plan: Regional and General   Post-op Pain Management: GA combined w/ Regional for post-op pain   Induction:   PONV Risk Score and Plan: Treatment may vary due to age or medical condition, Ondansetron and Dexamethasone  Airway Management Planned: Natural Airway and Simple Face Mask  Additional Equipment: None  Intra-op Plan:   Post-operative Plan: Extubation in OR  Informed Consent: I have reviewed the patients History and Physical, chart, labs and discussed the procedure including the risks, benefits and alternatives for the proposed anesthesia with the patient or authorized representative who has indicated his/her understanding and acceptance.     Dental advisory given  Plan Discussed with: CRNA  Anesthesia Plan Comments: (D/w patient spinal versus  general anesthesia in the setting of multiple prior lumbar surgeries. Did well with anesthesia for other knee replacement in 2013 (GA/LMA); decision to proceed with GA/LMA again. )      Anesthesia Quick Evaluation

## 2020-04-20 NOTE — H&P (Signed)
KNEE ARTHROPLASTY ADMISSION H&P  Patient ID: DELAYLA HOFFMASTER MRN: [id number] DOB/AGE: 80/07/1940 80 y.o.  Chief Complaint: right knee pain.  Planned Procedure Date: 04/21/20    Medical Clearance by Dr. Larose Kells  HPI: Rebecca [name] is a 80 y.o. female who presents for evaluation of djd right knee. The patient has a history of pain and functional disability in the right knee due to arthritis and has failed non-surgical conservative treatments for greater than 12 weeks to include NSAID's and/or analgesics, corticosteriod injections and activity modification.  Onset of symptoms was gradual, starting 5 years ago with gradually worsening course since that time. The patient noted no past surgery on the right knee.  Patient currently rates pain at 10 out of 10 with activity. Patient has night pain, worsening of pain with activity and weight bearing and pain that interferes with activities of daily living.  Patient has evidence of joint space narrowing by imaging studies.  There is no active infection.  Past Medical History:  Diagnosis Date  . Anxiety   . Arthritis    degenerative lumbar spine   . Chronic neck pain   . Closed fracture of base of fifth metatarsal bone 01/2018   Left  . Complication of anesthesia    at one time my blood pressure dropped -lumbar - on bp med at that time  . Depression   . Dysphagia    esophageal stretched 9-14   . History of blood transfusion 2009   post - lumbar fusion   . Hyperlipidemia   . Hypertension    No longer takes med.  . Osteoarthritis of left shoulder region    Mild, Dr. Mardelle Matte  . Osteoporosis    Past Surgical History:  Procedure Laterality Date  . ABDOMINAL HYSTERECTOMY    . ANTERIOR CERVICAL DECOMP/DISCECTOMY FUSION N/A 11/20/2014   Procedure: ANTERIOR CERVICAL DECOMPRESSION/DISCECTOMY FUSION 2 LEVELS;  Surgeon: Phylliss Bob, MD;  Location: [facility];  Service: Orthopedics;  Laterality: N/A;  Anterior cervical decompression fusion, cervical 3-4,  cervical 4-5 with instrumentation and allograft  . arm surgery Bilateral    transposed ulner nerve 10 (dr. Amedeo Plenty)  . BACK SURGERY  2009   Dr Jeralyn Bennett SURGERY  [id number]   Dr Saintclair Halsted  . carpal tunnell  2006   bilateral  . CHOLECYSTECTOMY    . KNEE SURGERY  9-11   right,  scope; left knee  . NECK SURGERY  05/2008   Dr Saintclair Halsted  . OOPHORECTOMY Bilateral   . PARTIAL KNEE ARTHROPLASTY  01/17/2012   LEFT KNEE  . PARTIAL KNEE ARTHROPLASTY  01/17/2012   Procedure: UNICOMPARTMENTAL KNEE;  Surgeon: Johnny Bridge, MD;  Location: [facility];  Service: Orthopedics;  Laterality: Left;  . RHINOPLASTY  1980  . SHOULDER SURGERY Bilateral    x 2/Dr. Collier Salina  . TONSILLECTOMY     Allergies  Allergen Reactions  . Tape Other (See Comments)    White adhesive tape -blisters  . Alendronate Sodium Other (See Comments)    REACTION: jaw pain  . Cortizone-10 [Hydrocortisone] Other (See Comments)    Pt received cortizone shot, and became very flushed, and hot, blisters, raw areas around lips & ears   . Oxycodone-Acetaminophen Hives  . Penicillins Swelling and Rash   Prior to Admission medications   Medication Sig Start Date End Date Taking? Authorizing Provider  acetaminophen (TYLENOL) 500 MG tablet Take 1,000 mg by mouth in the morning and at bedtime.   Yes [provider]  clonazePAM (  KLONOPIN) 0.5 MG tablet TAKE 1 TABLET(0.5 MG) BY MOUTH THREE TIMES DAILY AS NEEDED FOR ANXIETY Patient taking differently: Take 0.5 mg by mouth 3 (three) times daily as needed for anxiety. 02/03/20  Yes Paz, Alda Berthold, MD  escitalopram (LEXAPRO) 5 MG tablet TAKE 1 TABLET(5 MG) BY MOUTH DAILY Patient taking differently: Take 5 mg by mouth daily. 02/08/20  Yes Paz, Alda Berthold, MD  Multiple Vitamin (MULTIVITAMIN) tablet Take 1 tablet by mouth daily. Reported on 08/25/2015   Yes [provider]  Propylene Glycol (SYSTANE BALANCE) 0.6 % SOLN Place 1 drop into both eyes daily.   Yes [provider]  simvastatin  (ZOCOR) 40 MG tablet Take 1 tablet (40 mg total) by mouth at bedtime. 10/28/19  Yes Paz, Alda Berthold, MD  vitamin C (ASCORBIC ACID) 500 MG tablet Take 500 mg by mouth daily.   Yes [provider]  zinc gluconate 50 MG tablet Take 50 mg by mouth daily.   Yes [provider]  pantoprazole (PROTONIX) 40 MG tablet Take 1 tablet (40 mg total) by mouth daily before breakfast. 02/03/20   Colon Branch, MD   Social History   Socioeconomic History  . Marital status: Widowed    Spouse name: [name].  . Number of children: 4  . Years of education: Not on file  . Highest education level: Not on file  Occupational History  . Occupation: stay home   Tobacco Use  . Smoking status: Never Smoker  . Smokeless tobacco: Never Used  Vaping Use  . Vaping Use: Never used  Substance and Sexual Activity  . Alcohol use: Yes    Alcohol/week: 0.0 standard drinks    Comment: rare  . Drug use: No  . Sexual activity: Not Currently  Other Topics Concern  . Not on file  Social History Narrative   Lost husband 2021   Household: pt and son [name]   Children: 3 daughters and 1 son   Social Determinants of Health   Financial Resource Strain: Not on file  Food Insecurity: Not on file  Transportation Needs: Not on file  Physical Activity: Not on file  Stress: Not on file  Social Connections: Not on file   Family History  Problem Relation Age of Onset  . Prostate cancer Father   . Diabetes Sister   . Heart disease Sister        sister and brothers x 2  . Kidney disease Sister   . Kidney disease Brother   . Heart disease Brother   . Stomach cancer Maternal Aunt   . Colon cancer Neg Hx   . Breast cancer Neg Hx   . Esophageal cancer Neg Hx   . Pancreatic cancer Neg Hx     ROS: Currently denies lightheadedness, dizziness, Fever, chills, CP, SOB.   No personal history of DVT, PE, MI, or CVA. No loose teeth. She does have dentures which she will take out preoperatively.  All other systems have  been reviewed and were otherwise currently negative with the exception of those mentioned in the HPI and as above.  Objective: Vitals: Ht: 5'0" WT:157 lbs T: 97.1 deg F  BP: 156/81  P: 91 BPM  O2 SAT: 93% on room air.  Physical Exam: General: Alert, NAD.  Antalgic Gait HEENT: EOMI, Good Neck Extension  Pulm: No increased work of breathing.  Clear B/L A/P w/o crackle or wheeze.  CV: RRR, No m/g/r appreciated  GI: soft, NT, ND.normal bowel sounds Neuro:  Neuro without gross focal deficit.  Sensation intact distally Skin: No lesions in the area of chief complaint MSK/Surgical Site: right knee w/o redness or effusion.  lateral JLT. ROM 10-90.  5/5 strength in extension and flexion.  +EHL/FHL.  NVI.  Stable varus and valgus stress. + crepitus. Nodule boted over patellar tendon.   Imaging Review Plain radiographs demonstrate severe anteromedial degenerative joint disease of the right knee.    Assessment: djd right knee Principal Problem:   Osteoarthritis of right knee   Plan: Plan for Procedure(s): UNICOMPARTMENTAL KNEE  The patient history, physical exam, clinical judgement of the provider and imaging are consistent with end stage degenerative joint disease and unicompartmental joint arthroplasty is deemed medically necessary. The treatment options including medical management, injection therapy, and arthroplasty were discussed at length. The risks and benefits of Procedure(s): UNICOMPARTMENTAL KNEE were presented and reviewed.  The risks of nonoperative treatment, versus surgical intervention including but not limited to continued pain, aseptic loosening, stiffness, dislocation/subluxation, infection, bleeding, nerve injury, blood clots, cardiopulmonary complications, morbidity, mortality, among others were discussed. The patient verbalizes understanding and wishes to proceed with the plan.  Patient is being admitted for inpatient treatment for surgery, pain control, PT, prophylactic  antibiotics, VTE prophylaxis, progressive ambulation, ADL's and discharge planning.   Dental prophylaxis discussed and recommended for 2 years postoperatively.   The patient does meet the criteria for TXA which will be used perioperatively.    ASA 325 mg will be used postoperatively for DVT prophylaxis in addition to SCDs, and early ambulation. The patient is planning to be discharged home in care of her son who will be working with her on a HEP post operatively. She has elected to not have [place] or OP PT.    Ventura Bruns, PA-C 04/20/2020 1:24 PM

## 2020-04-21 ENCOUNTER — Ambulatory Visit (HOSPITAL_COMMUNITY): Payer: Medicare Other | Admitting: Anesthesiology

## 2020-04-21 ENCOUNTER — Ambulatory Visit (HOSPITAL_COMMUNITY): Payer: Medicare Other | Admitting: Physician Assistant

## 2020-04-21 ENCOUNTER — Ambulatory Visit (HOSPITAL_COMMUNITY): Payer: Medicare Other

## 2020-04-21 ENCOUNTER — Encounter (HOSPITAL_COMMUNITY): Payer: Self-pay | Admitting: Orthopedic Surgery

## 2020-04-21 ENCOUNTER — Ambulatory Visit (HOSPITAL_COMMUNITY)
Admission: RE | Admit: 2020-04-21 | Discharge: 2020-04-21 | Disposition: A | Discharge status: Home / Self Care | Payer: Medicare Other | Source: Other Acute Inpatient Hospital | Attending: Orthopedic Surgery | Admitting: Orthopedic Surgery

## 2020-04-21 ENCOUNTER — Encounter (HOSPITAL_COMMUNITY)
Admission: RE | Disposition: A | Discharge status: Home / Self Care | Payer: Self-pay | Source: Other Acute Inpatient Hospital | Attending: Orthopedic Surgery

## 2020-04-21 DIAGNOSIS — I10 Essential (primary) hypertension: Secondary | ICD-10-CM | POA: Diagnosis not present

## 2020-04-21 DIAGNOSIS — Z96651 Presence of right artificial knee joint: Secondary | ICD-10-CM

## 2020-04-21 DIAGNOSIS — M1711 Unilateral primary osteoarthritis, right knee: Secondary | ICD-10-CM | POA: Diagnosis not present

## 2020-04-21 DIAGNOSIS — Z471 Aftercare following joint replacement surgery: Secondary | ICD-10-CM | POA: Diagnosis not present

## 2020-04-21 DIAGNOSIS — Z885 Allergy status to narcotic agent status: Secondary | ICD-10-CM | POA: Diagnosis not present

## 2020-04-21 DIAGNOSIS — M11261 Other chondrocalcinosis, right knee: Secondary | ICD-10-CM | POA: Diagnosis not present

## 2020-04-21 DIAGNOSIS — F418 Other specified anxiety disorders: Secondary | ICD-10-CM | POA: Diagnosis not present

## 2020-04-21 DIAGNOSIS — Z888 Allergy status to other drugs, medicaments and biological substances status: Secondary | ICD-10-CM | POA: Diagnosis not present

## 2020-04-21 DIAGNOSIS — Z880 Allergy status to penicillin: Secondary | ICD-10-CM | POA: Insufficient documentation

## 2020-04-21 DIAGNOSIS — G8918 Other acute postprocedural pain: Secondary | ICD-10-CM | POA: Diagnosis not present

## 2020-04-21 DIAGNOSIS — Z79899 Other long term (current) drug therapy: Secondary | ICD-10-CM | POA: Insufficient documentation

## 2020-04-21 HISTORY — PX: PARTIAL KNEE ARTHROPLASTY: SHX2174

## 2020-04-21 SURGERY — ARTHROPLASTY, KNEE, UNICOMPARTMENTAL
Anesthesia: Regional | Site: Knee | Laterality: Right

## 2020-04-21 MED ORDER — HYDROCODONE-ACETAMINOPHEN 7.5-325 MG PO TABS: 1.0000 | ORAL_TABLET | Freq: Once | ORAL | Status: DC | PRN

## 2020-04-21 MED ORDER — ONDANSETRON HCL 4 MG/2ML IJ SOLN
INTRAMUSCULAR | Status: DC | PRN
  Administered 2020-04-21: 4 mg via INTRAVENOUS

## 2020-04-21 MED ORDER — MIDAZOLAM HCL 5 MG/5ML IJ SOLN
INTRAMUSCULAR | Status: DC | PRN
  Administered 2020-04-21: 1 mg via INTRAVENOUS

## 2020-04-21 MED ORDER — MIDAZOLAM HCL 2 MG/2ML IJ SOLN
INTRAMUSCULAR | Status: AC
  Filled 2020-04-21: qty 2

## 2020-04-21 MED ORDER — DEXAMETHASONE SODIUM PHOSPHATE 10 MG/ML IJ SOLN
INTRAMUSCULAR | Status: AC
  Filled 2020-04-21: qty 1

## 2020-04-21 MED ORDER — ONDANSETRON HCL 4 MG/2ML IJ SOLN
INTRAMUSCULAR | Status: AC
  Filled 2020-04-21: qty 2

## 2020-04-21 MED ORDER — ONDANSETRON HCL 4 MG/2ML IJ SOLN: 4.0000 mg | Freq: Once | INTRAMUSCULAR | Status: DC | PRN

## 2020-04-21 MED ORDER — ONDANSETRON HCL 4 MG PO TABS: 4.0000 mg | ORAL_TABLET | Freq: Three times a day (TID) | ORAL | 0 refills | Status: DC | PRN

## 2020-04-21 MED ORDER — TRANEXAMIC ACID-NACL 1000-0.7 MG/100ML-% IV SOLN
1000.0000 mg | Freq: Once | INTRAVENOUS | Status: AC
  Administered 2020-04-21: 1000 mg via INTRAVENOUS

## 2020-04-21 MED ORDER — 0.9 % SODIUM CHLORIDE (POUR BTL) OPTIME
TOPICAL | Status: DC | PRN
  Administered 2020-04-21: 1000 mL

## 2020-04-21 MED ORDER — LACTATED RINGERS IV BOLUS
500.0000 mL | Freq: Once | INTRAVENOUS | Status: AC
  Administered 2020-04-21: 500 mL via INTRAVENOUS

## 2020-04-21 MED ORDER — KETOROLAC TROMETHAMINE 30 MG/ML IJ SOLN
INTRAMUSCULAR | Status: AC
  Filled 2020-04-21: qty 1

## 2020-04-21 MED ORDER — ROPIVACAINE HCL 5 MG/ML IJ SOLN
INTRAMUSCULAR | Status: DC | PRN
  Administered 2020-04-21: 30 mL via PERINEURAL

## 2020-04-21 MED ORDER — POVIDONE-IODINE 10 % EX SWAB
2.0000 "application " | Freq: Once | CUTANEOUS | Status: AC
  Administered 2020-04-21: 2 via TOPICAL

## 2020-04-21 MED ORDER — TRANEXAMIC ACID-NACL 1000-0.7 MG/100ML-% IV SOLN
INTRAVENOUS | Status: AC
  Filled 2020-04-21: qty 100

## 2020-04-21 MED ORDER — PROPOFOL 500 MG/50ML IV EMUL
INTRAVENOUS | Status: AC
  Filled 2020-04-21: qty 50

## 2020-04-21 MED ORDER — ACETAMINOPHEN 500 MG PO TABS
1000.0000 mg | ORAL_TABLET | Freq: Once | ORAL | Status: AC
  Administered 2020-04-21: 1000 mg via ORAL
  Filled 2020-04-21: qty 2

## 2020-04-21 MED ORDER — ORAL CARE MOUTH RINSE: 15.0000 mL | Freq: Once | OROMUCOSAL | Status: AC

## 2020-04-21 MED ORDER — KETOROLAC TROMETHAMINE 30 MG/ML IJ SOLN
INTRAMUSCULAR | Status: DC | PRN
  Administered 2020-04-21: 30 mg

## 2020-04-21 MED ORDER — LACTATED RINGERS IV SOLN: INTRAVENOUS | Status: DC

## 2020-04-21 MED ORDER — BUPIVACAINE HCL 0.25 % IJ SOLN
INTRAMUSCULAR | Status: DC | PRN
  Administered 2020-04-21: 30 mL

## 2020-04-21 MED ORDER — HYDROMORPHONE HCL 1 MG/ML IJ SOLN
INTRAMUSCULAR | Status: DC | PRN
  Administered 2020-04-21 (×4): .5 mg via INTRAVENOUS

## 2020-04-21 MED ORDER — TRANEXAMIC ACID-NACL 1000-0.7 MG/100ML-% IV SOLN
1000.0000 mg | INTRAVENOUS | Status: AC
  Administered 2020-04-21: 1000 mg via INTRAVENOUS
  Filled 2020-04-21: qty 100

## 2020-04-21 MED ORDER — EPHEDRINE 5 MG/ML INJ
INTRAVENOUS | Status: AC
  Filled 2020-04-21: qty 10

## 2020-04-21 MED ORDER — VANCOMYCIN HCL IN DEXTROSE 1-5 GM/200ML-% IV SOLN
1000.0000 mg | INTRAVENOUS | Status: AC
  Administered 2020-04-21: 1000 mg via INTRAVENOUS
  Filled 2020-04-21: qty 200

## 2020-04-21 MED ORDER — PHENYLEPHRINE HCL (PRESSORS) 10 MG/ML IV SOLN: INTRAVENOUS | Status: DC | PRN

## 2020-04-21 MED ORDER — PHENYLEPHRINE 40 MCG/ML (10ML) SYRINGE FOR IV PUSH (FOR BLOOD PRESSURE SUPPORT)
PREFILLED_SYRINGE | INTRAVENOUS | Status: DC | PRN
  Administered 2020-04-21 (×2): 80 ug via INTRAVENOUS
  Administered 2020-04-21: 40 ug via INTRAVENOUS

## 2020-04-21 MED ORDER — ACETAMINOPHEN 500 MG PO TABS: 1000.0000 mg | ORAL_TABLET | Freq: Once | ORAL | Status: DC

## 2020-04-21 MED ORDER — DEXAMETHASONE SODIUM PHOSPHATE 10 MG/ML IJ SOLN
INTRAMUSCULAR | Status: DC | PRN
  Administered 2020-04-21: 10 mg

## 2020-04-21 MED ORDER — TRAMADOL HCL 50 MG PO TABS: 50.0000 mg | ORAL_TABLET | Freq: Four times a day (QID) | ORAL | 0 refills | Status: AC | PRN

## 2020-04-21 MED ORDER — KETAMINE HCL 10 MG/ML IJ SOLN
INTRAMUSCULAR | Status: DC | PRN
  Administered 2020-04-21: 30 mg via INTRAVENOUS

## 2020-04-21 MED ORDER — ASPIRIN EC 325 MG PO TBEC: 325.0000 mg | DELAYED_RELEASE_TABLET | Freq: Two times a day (BID) | ORAL | 0 refills | Status: DC

## 2020-04-21 MED ORDER — FENTANYL CITRATE (PF) 100 MCG/2ML IJ SOLN
INTRAMUSCULAR | Status: AC
  Filled 2020-04-21: qty 2

## 2020-04-21 MED ORDER — PHENYLEPHRINE 40 MCG/ML (10ML) SYRINGE FOR IV PUSH (FOR BLOOD PRESSURE SUPPORT)
PREFILLED_SYRINGE | INTRAVENOUS | Status: AC
  Filled 2020-04-21: qty 10

## 2020-04-21 MED ORDER — CHLORHEXIDINE GLUCONATE 0.12 % MT SOLN
15.0000 mL | Freq: Once | OROMUCOSAL | Status: AC
  Administered 2020-04-21: 15 mL via OROMUCOSAL

## 2020-04-21 MED ORDER — FENTANYL CITRATE (PF) 100 MCG/2ML IJ SOLN
INTRAMUSCULAR | Status: DC | PRN
  Administered 2020-04-21 (×4): 50 ug via INTRAVENOUS

## 2020-04-21 MED ORDER — LIDOCAINE HCL (PF) 2 % IJ SOLN
INTRAMUSCULAR | Status: AC
  Filled 2020-04-21: qty 5

## 2020-04-21 MED ORDER — DEXAMETHASONE SODIUM PHOSPHATE 10 MG/ML IJ SOLN
INTRAMUSCULAR | Status: DC | PRN
  Administered 2020-04-21: 8 mg via INTRAVENOUS

## 2020-04-21 MED ORDER — PROPOFOL 10 MG/ML IV BOLUS
INTRAVENOUS | Status: DC | PRN
  Administered 2020-04-21: 50 mg via INTRAVENOUS
  Administered 2020-04-21: 100 mg via INTRAVENOUS

## 2020-04-21 MED ORDER — LIDOCAINE 2% (20 MG/ML) 5 ML SYRINGE
INTRAMUSCULAR | Status: DC | PRN
  Administered 2020-04-21: 60 mg via INTRAVENOUS

## 2020-04-21 MED ORDER — SENNA-DOCUSATE SODIUM 8.6-50 MG PO TABS: 2.0000 | ORAL_TABLET | Freq: Every day | ORAL | 1 refills | Status: DC

## 2020-04-21 MED ORDER — BUPIVACAINE HCL 0.25 % IJ SOLN
INTRAMUSCULAR | Status: AC
  Filled 2020-04-21: qty 1

## 2020-04-21 MED ORDER — KETAMINE HCL 10 MG/ML IJ SOLN
INTRAMUSCULAR | Status: AC
  Filled 2020-04-21: qty 1

## 2020-04-21 MED ORDER — HYDROMORPHONE HCL 2 MG/ML IJ SOLN
INTRAMUSCULAR | Status: AC
  Filled 2020-04-21: qty 1

## 2020-04-21 MED ORDER — LACTATED RINGERS IV BOLUS
250.0000 mL | Freq: Once | INTRAVENOUS | Status: AC
  Administered 2020-04-21: 250 mL via INTRAVENOUS

## 2020-04-21 MED ORDER — PROPOFOL 10 MG/ML IV BOLUS
INTRAVENOUS | Status: AC
  Filled 2020-04-21: qty 20

## 2020-04-21 MED ORDER — HYDROMORPHONE HCL 1 MG/ML IJ SOLN: 0.2500 mg | INTRAMUSCULAR | Status: DC | PRN

## 2020-04-21 MED ORDER — EPHEDRINE SULFATE-NACL 50-0.9 MG/10ML-% IV SOSY
PREFILLED_SYRINGE | INTRAVENOUS | Status: DC | PRN
  Administered 2020-04-21: 10 mg via INTRAVENOUS

## 2020-04-21 SURGICAL SUPPLY — 69 items
BAG SPEC THK2 15X12 ZIP CLS (MISCELLANEOUS) ×1
BAG ZIPLOCK 12X15 (MISCELLANEOUS) ×2 IMPLANT
BANDAGE ESMARK 6X9 LF (GAUZE/BANDAGES/DRESSINGS) ×1 IMPLANT
BEARING MENISCAL TIBIAL 7 SM R (Orthopedic Implant) ×1 IMPLANT
BIT DRILL QUICK REL 1/8 2PK SL (DRILL) IMPLANT
BLADE SURG 15 STRL LF DISP TIS (BLADE) ×1 IMPLANT
BLADE SURG 15 STRL SS (BLADE) ×2
BNDG CMPR 9X6 STRL LF SNTH (GAUZE/BANDAGES/DRESSINGS) ×1
BNDG CMPR MED 15X6 ELC VLCR LF (GAUZE/BANDAGES/DRESSINGS) ×1
BNDG ELASTIC 6X15 VLCR STRL LF (GAUZE/BANDAGES/DRESSINGS) ×2 IMPLANT
BNDG ESMARK 6X9 LF (GAUZE/BANDAGES/DRESSINGS) ×2
BOWL SMART MIX CTS (DISPOSABLE) ×2 IMPLANT
BRNG TIB SM 7 PHS 3 RT MEN (Orthopedic Implant) ×1 IMPLANT
CEMENT BONE R 1X40 (Cement) ×2 IMPLANT
CLSR STERI-STRIP ANTIMIC 1/2X4 (GAUZE/BANDAGES/DRESSINGS) ×2 IMPLANT
COVER SURGICAL LIGHT HANDLE (MISCELLANEOUS) ×2 IMPLANT
COVER WAND RF STERILE (DRAPES) IMPLANT
CUFF TOURN SGL QUICK 34 (TOURNIQUET CUFF) ×2
CUFF TRNQT CYL 34X4.125X (TOURNIQUET CUFF) ×1 IMPLANT
DECANTER SPIKE VIAL GLASS SM (MISCELLANEOUS) IMPLANT
DRAPE EXTREMITY T 121X128X90 (DISPOSABLE) ×2 IMPLANT
DRAPE POUCH INSTRU U-SHP 10X18 (DRAPES) ×2 IMPLANT
DRAPE SHEET LG 3/4 BI-LAMINATE (DRAPES) ×2 IMPLANT
DRAPE U-SHAPE 47X51 STRL (DRAPES) ×2 IMPLANT
DRILL QUICK RELEASE 1/8 INCH (DRILL) ×2
DRSG MEPILEX BORDER 4X8 (GAUZE/BANDAGES/DRESSINGS) ×2 IMPLANT
DRSG PAD ABDOMINAL 8X10 ST (GAUZE/BANDAGES/DRESSINGS) ×2 IMPLANT
DURAPREP 26ML APPLICATOR (WOUND CARE) ×4 IMPLANT
ELECT REM PT RETURN 15FT ADLT (MISCELLANEOUS) ×2 IMPLANT
FACESHIELD WRAPAROUND (MASK) ×2 IMPLANT
FACESHIELD WRAPAROUND OR TEAM (MASK) ×1 IMPLANT
GLOVE SRG 8 PF TXTR STRL LF DI (GLOVE) ×1 IMPLANT
GLOVE SURG ENC MOIS LTX SZ7 (GLOVE) ×2 IMPLANT
GLOVE SURG SS PI 7.5 STRL IVOR (GLOVE) ×2 IMPLANT
GLOVE SURG UNDER POLY LF SZ7 (GLOVE) ×2 IMPLANT
GLOVE SURG UNDER POLY LF SZ8 (GLOVE) ×2
GOWN STRL REUS W/TWL LRG LVL3 (GOWN DISPOSABLE) ×4 IMPLANT
HANDPIECE INTERPULSE COAX TIP (DISPOSABLE) ×2
HOLDER FOLEY CATH W/STRAP (MISCELLANEOUS) IMPLANT
HOOD PEEL AWAY FLYTE STAYCOOL (MISCELLANEOUS) ×4 IMPLANT
IMMOBILIZER KNEE 20 (SOFTGOODS) ×2
IMMOBILIZER KNEE 20 THIGH 36 (SOFTGOODS) IMPLANT
IMMOBILIZER KNEE 22 UNIV (SOFTGOODS) IMPLANT
KIT BASIN OR (CUSTOM PROCEDURE TRAY) ×2 IMPLANT
KIT TURNOVER KIT A (KITS) ×2 IMPLANT
NDL SAFETY ECLIPSE 18X1.5 (NEEDLE) ×1 IMPLANT
NEEDLE HYPO 18GX1.5 SHARP (NEEDLE) ×2
NS IRRIG 1000ML POUR BTL (IV SOLUTION) ×2 IMPLANT
PACK BLADE SAW RECIP 70 3 PT (BLADE) ×1 IMPLANT
PACK ICE MAXI GEL EZY WRAP (MISCELLANEOUS) ×2 IMPLANT
PACK TOTAL JOINT (CUSTOM PROCEDURE TRAY) ×2 IMPLANT
PEG FEMORAL PEGGED STRL SM (Knees) ×1 IMPLANT
PENCIL SMOKE EVACUATOR (MISCELLANEOUS) IMPLANT
PROTECTOR NERVE ULNAR (MISCELLANEOUS) ×2 IMPLANT
SET HNDPC FAN SPRY TIP SCT (DISPOSABLE) ×1 IMPLANT
SUCTION FRAZIER HANDLE 12FR (TUBING) ×2
SUCTION TUBE FRAZIER 12FR DISP (TUBING) ×1 IMPLANT
SUT VIC AB 1 CT1 36 (SUTURE) ×2 IMPLANT
SUT VIC AB 2-0 CT1 27 (SUTURE) ×2
SUT VIC AB 2-0 CT1 TAPERPNT 27 (SUTURE) ×1 IMPLANT
SUT VIC AB 3-0 SH 8-18 (SUTURE) ×2 IMPLANT
SYR 30ML LL (SYRINGE) ×2 IMPLANT
SYR 3ML LL SCALE MARK (SYRINGE) ×2 IMPLANT
TOWEL OR 17X26 10 PK STRL BLUE (TOWEL DISPOSABLE) ×2 IMPLANT
TOWEL OR NON WOVEN STRL DISP B (DISPOSABLE) ×2 IMPLANT
TRAY FOLEY MTR SLVR 16FR STAT (SET/KITS/TRAYS/PACK) ×2 IMPLANT
TRAY TIBIA MEDIAL OXFORD SZ A (Joint) ×1 IMPLANT
WATER STERILE IRR 1000ML POUR (IV SOLUTION) ×2 IMPLANT
WRAP KNEE MAXI GEL POST OP (GAUZE/BANDAGES/DRESSINGS) ×1 IMPLANT

## 2020-04-21 NOTE — Anesthesia Procedure Notes (Signed)
Anesthesia Regional Block: Adductor canal block   Pre-Anesthetic Checklist: ,, timeout performed, Correct Patient, Correct Site, Correct Laterality, Correct Procedure, Correct Position, site marked, Risks and benefits discussed,  Surgical consent,  Pre-op evaluation,  At surgeon's request and post-op pain management  Laterality: Right  Prep: Maximum Sterile Barrier Precautions used, chloraprep       Needles:  Injection technique: Single-shot  Needle Type: Echogenic Stimulator Needle     Needle Length: 9cm  Needle Gauge: 22     Additional Needles:   Procedures:,,,, ultrasound used (permanent image in chart),,,,  Narrative:  Start time: 04/21/2020 6:50 AM End time: 04/21/2020 6:55 AM Injection made incrementally with aspirations every 5 mL.  Performed by: Personally  Anesthesiologist: Pervis Hocking, DO  Additional Notes: Monitors applied. No increased pain on injection. No increased resistance to injection. Injection made in 5cc increments. Good needle visualization. Patient tolerated procedure well.

## 2020-04-21 NOTE — Evaluation (Signed)
Physical Therapy Evaluation Patient Details Name: Rebecca [name] MRN: [id number] DOB: 1941-02-17 Today's Date: 04/21/2020   History of Present Illness  Patient is 80 y.o. female s/p Rt UKA on 04/21/20 with [place] significant for HTN, HLD, OA, osteoporosis, depression, anxiety, Lt UKA in 2013, ACDF in 2016.    Clinical Impression  Rebecca [name] is a 80 y.o. female POD 0 s/p Rt UKA. Patient reports independence with mobility at baseline. Patient is now limited by functional impairments (see PT problem list below) and requires min guard/supervision for transfers and gait with RW. Patient was able to ambulate ~100 feet with RW and min guard and cues for safe walker management. Patient educated on safe sequencing for stair mobility and verbalized safe guarding position for people assisting with mobility. Patients daughter present and provided safe guarding during gait and stair mobility with instruction from therapist.Patient instructed in exercises to facilitate ROM and circulation. Patient will benefit from continued skilled PT interventions to address impairments and progress towards PLOF. Patient has met mobility goals at adequate level for discharge home; will continue to follow if pt continues acute stay to progress towards Mod I goals.     Follow Up Recommendations Follow surgeon's recommendation for DC plan and follow-up therapies;Home health PT (pt would like some HHPT)    Equipment Recommendations  Rolling walker with 5" wheels (youth)    Recommendations for Other Services       Precautions / Restrictions Precautions Precautions: Fall Restrictions Weight Bearing Restrictions: No Other Position/Activity Restrictions: WBAT      Mobility  Bed Mobility Overal bed mobility: Needs Assistance Bed Mobility: Supine to Sit;Sit to Supine     Supine to sit: Min guard;HOB elevated Sit to supine: Min guard;HOB elevated        Transfers Overall transfer level: Needs  assistance Equipment used: Rolling walker (2 wheeled) Transfers: Sit to/from [facility] Sit to Stand: Min guard Stand pivot transfers: Min guard          Ambulation/Gait Ambulation/Gait assistance: Min guard;Supervision Gait Distance (Feet): 100 Feet Assistive device: Rolling walker (2 wheeled) Gait Pattern/deviations: Step-to pattern;Decreased step length - right;Decreased step length - left;Decreased stride length;Decreased stance time - right;Shuffle Gait velocity: ecr      Stairs Stairs: Yes Stairs assistance: Min assist;+2 safety/equipment Stair Management: No rails;Step to pattern;Forwards;With walker Number of Stairs: 3    Wheelchair Mobility    Modified Rankin (Stroke Patients Only)       Balance Overall balance assessment: Needs assistance Sitting-balance support: Feet supported Sitting balance-Leahy Scale: Good     Standing balance support: During functional activity;Bilateral upper extremity supported Standing balance-Leahy Scale: Poor Standing balance comment: relianton external support of RW                             Pertinent Vitals/Pain      Home Living Family/patient expects to be discharged to:: Private residence Living Arrangements: Children;Other relatives (daughter and grandson) Available Help at Discharge: Family Type of Home: House Home Access: Stairs to enter Entrance Stairs-Rails: None Entrance Stairs-Number of Steps: 2 deep/wide platform steps Home Layout: Two level;Able to live on main level with bedroom/bathroom;Bed/bath upstairs;1/2 bath on main level (can stay in recliner) Home Equipment: Walker - 2 wheels;Cane - single point;Shower seat;Bedside commode;Grab bars - tub/shower (2 RW)      Prior Function Level of Independence: Independent with assistive device(s)         Comments: pt  using SPC for balance     Hand Dominance   Dominant Hand: Right    Extremity/Trunk Assessment   Upper  Extremity Assessment Upper Extremity Assessment: Overall WFL for tasks assessed    Lower Extremity Assessment Lower Extremity Assessment: RLE deficits/detail RLE Deficits / Details: good quad activation, no extensor lag with SLR, no buckling in standing RLE Sensation: WNL RLE Coordination: WNL    Cervical / Trunk Assessment Cervical / Trunk Assessment: Normal  Communication   Communication: No difficulties  Cognition Arousal/Alertness: Awake/alert Behavior During Therapy: WFL for tasks assessed/performed Overall Cognitive Status: Within Functional Limits for tasks assessed                                        General Comments      Exercises Total Joint Exercises Ankle Circles/Pumps: AROM;Both;20 reps;Supine Quad Sets: AROM;Right;5 reps;Supine Short Arc Quad: AROM;Right;5 reps;Supine Heel Slides: AROM;Right;5 reps;Supine Hip ABduction/ADduction: AROM;Right;5 reps;Supine Straight Leg Raises: AROM;Right;5 reps;Supine   Assessment/Plan    PT Assessment Patient needs continued PT services  PT Problem List Decreased strength;Decreased range of motion;Decreased activity tolerance;Decreased balance;Decreased mobility;Decreased knowledge of use of DME;Decreased knowledge of precautions       PT Treatment Interventions DME instruction;Gait training;Stair training;Functional mobility training;Therapeutic activities;Therapeutic exercise;Balance training;Patient/family education    PT Goals (Current goals can be found in the Care Plan section)  Acute Rehab PT Goals Patient Stated Goal: get home and recover to get back in the garden PT Goal Formulation: With patient Time For Goal Achievement: 04/28/20 Potential to Achieve Goals: Good    Frequency 7X/week   Barriers to discharge        Co-evaluation               AM-PAC PT "6 Clicks" Mobility  Outcome Measure Help needed turning from your back to your side while in a flat bed without using  bedrails?: A Little Help needed moving from lying on your back to sitting on the side of a flat bed without using bedrails?: A Little Help needed moving to and from a bed to a chair (including a wheelchair)?: A Little Help needed standing up from a chair using your arms (e.g., wheelchair or bedside chair)?: A Little Help needed to walk in hospital room?: A Little Help needed climbing 3-5 steps with a railing? : A Little 6 Click Score: 18    End of Session Equipment Utilized During Treatment: Gait belt Activity Tolerance: Patient tolerated treatment well Patient left: in bed;with call bell/phone within reach;with family/visitor present Nurse Communication: Mobility status PT Visit Diagnosis: Muscle weakness (generalized) (M62.81);Difficulty in walking, not elsewhere classified (R26.2)    Time: 6222-9798 PT Time Calculation (min) (ACUTE ONLY): 53 min   Charges:   PT Evaluation $PT Eval Low Complexity: 1 Low PT Treatments $Gait Training: 23-37 mins $Therapeutic Exercise: 8-22 mins        Verner Mould, DPT Acute Rehabilitation Services Office [phone] Pager [phone]    Jacques Navy 04/21/2020, 2:26 PM

## 2020-04-21 NOTE — Discharge Instructions (Signed)
INSTRUCTIONS AFTER JOINT REPLACEMENT  ° °o Remove items at home which could result in a fall. This includes throw rugs or furniture in walking pathways °o ICE to the affected joint every three hours while awake for 30 minutes at a time, for at least the first 3-5 days, and then as needed for pain and swelling.  Continue to use ice for pain and swelling. You may notice swelling that will progress down to the foot and ankle.  This is normal after surgery.  Elevate your leg when you are not up walking on it.   °o Continue to use the breathing machine you got in the hospital (incentive spirometer) which will help keep your temperature down.  It is common for your temperature to cycle up and down following surgery, especially at night when you are not up moving around and exerting yourself.  The breathing machine keeps your lungs expanded and your temperature down. ° ° °DIET:  As you were doing prior to hospitalization, we recommend a well-balanced diet. ° °DRESSING / WOUND CARE / SHOWERING ° °You may change your dressing 3-5 days after surgery.  Then change the dressing every day with sterile gauze.  Please use good hand washing techniques before changing the dressing.  Do not use any lotions or creams on the incision until instructed by your surgeon. ° °ACTIVITY ° °o Increase activity slowly as tolerated, but follow the weight bearing instructions below.   °o No driving for 6 weeks or until further direction given by your physician.  You cannot drive while taking narcotics.  °o No lifting or carrying greater than 10 lbs. until further directed by your surgeon. °o Avoid periods of inactivity such as sitting longer than an hour when not asleep. This helps prevent blood clots.  °o You may return to work once you are authorized by your doctor.  ° ° ° °WEIGHT BEARING  ° °Weight bearing as tolerated with assist device (walker, cane, etc) as directed, use it as long as suggested by your surgeon or therapist, typically at  least 4-6 weeks. ° ° °EXERCISES ° °Results after joint replacement surgery are often greatly improved when you follow the exercise, range of motion and muscle strengthening exercises prescribed by your doctor. Safety measures are also important to protect the joint from further injury. Any time any of these exercises cause you to have increased pain or swelling, decrease what you are doing until you are comfortable again and then slowly increase them. If you have problems or questions, call your caregiver or physical therapist for advice.  ° °Rehabilitation is important following a joint replacement. After just a few days of immobilization, the muscles of the leg can become weakened and shrink (atrophy).  These exercises are designed to build up the tone and strength of the thigh and leg muscles and to improve motion. Often times heat used for twenty to thirty minutes before working out will loosen up your tissues and help with improving the range of motion but do not use heat for the first two weeks following surgery (sometimes heat can increase post-operative swelling).  ° °These exercises can be done on a training (exercise) mat, on the floor, on a table or on a bed. Use whatever works the best and is most comfortable for you.    Use music or television while you are exercising so that the exercises are a pleasant break in your day. This will make your life better with the exercises acting as a break   in your routine that you can look forward to.   Perform all exercises about fifteen times, three times per day or as directed.  You should exercise both the operative leg and the other leg as well. ° °Exercises include: °  °• Quad Sets - Tighten up the muscle on the front of the thigh (Quad) and hold for 5-10 seconds.   °• Straight Leg Raises - With your knee straight (if you were given a brace, keep it on), lift the leg to 60 degrees, hold for 3 seconds, and slowly lower the leg.  Perform this exercise against  resistance later as your leg gets stronger.  °• Leg Slides: Lying on your back, slowly slide your foot toward your buttocks, bending your knee up off the floor (only go as far as is comfortable). Then slowly slide your foot back down until your leg is flat on the floor again.  °• Angel Wings: Lying on your back spread your legs to the side as far apart as you can without causing discomfort.  °• Hamstring Strength:  Lying on your back, push your heel against the floor with your leg straight by tightening up the muscles of your buttocks.  Repeat, but this time bend your knee to a comfortable angle, and push your heel against the floor.  You may put a pillow under the heel to make it more comfortable if necessary.  ° °A rehabilitation program following joint replacement surgery can speed recovery and prevent re-injury in the future due to weakened muscles. Contact your doctor or a physical therapist for more information on knee rehabilitation.  ° ° °CONSTIPATION ° °Constipation is defined medically as fewer than three stools per week and severe constipation as less than one stool per week.  Even if you have a regular bowel pattern at home, your normal regimen is likely to be disrupted due to multiple reasons following surgery.  Combination of anesthesia, postoperative narcotics, change in appetite and fluid intake all can affect your bowels.  ° °YOU MUST use at least one of the following options; they are listed in order of increasing strength to get the job done.  They are all available over the counter, and you may need to use some, POSSIBLY even all of these options:   ° °Drink plenty of fluids (prune juice may be helpful) and high fiber foods °Colace 100 mg by mouth twice a day  °Senokot for constipation as directed and as needed Dulcolax (bisacodyl), take with full glass of water  °Miralax (polyethylene glycol) once or twice a day as needed. ° °If you have tried all these things and are unable to have a bowel  movement in the first 3-4 days after surgery call either your surgeon or your primary doctor.   ° °If you experience loose stools or diarrhea, hold the medications until you stool forms back up.  If your symptoms do not get better within 1 week or if they get worse, check with your doctor.  If you experience "the worst abdominal pain ever" or develop nausea or vomiting, please contact the office immediately for further recommendations for treatment. ° ° °ITCHING:  If you experience itching with your medications, try taking only a single pain pill, or even half a pain pill at a time.  You can also use Benadryl over the counter for itching or also to help with sleep.  ° °TED HOSE STOCKINGS:  Use stockings on both legs until for at least 2 weeks or as   directed by physician office. They may be removed at night for sleeping.  MEDICATIONS:  See your medication summary on the After Visit Summary that nursing will review with you.  You may have some home medications which will be placed on hold until you complete the course of blood thinner medication.  It is important for you to complete the blood thinner medication as prescribed.  PRECAUTIONS:  If you experience chest pain or shortness of breath - call 911 immediately for transfer to the hospital emergency department.   If you develop a fever greater that 101 F, purulent drainage from wound, increased redness or drainage from wound, foul odor from the wound/dressing, or calf pain - CONTACT YOUR SURGEON.                                                   FOLLOW-UP APPOINTMENTS:  If you do not already have a post-op appointment, please call the office for an appointment to be seen by your surgeon.  Guidelines for how soon to be seen are listed in your After Visit Summary, but are typically between 1-4 weeks after surgery.  OTHER INSTRUCTIONS:   Knee Replacement:  Do not place pillow under knee, focus on keeping the knee straight while resting.   DENTAL  ANTIBIOTICS:  In most cases prophylactic antibiotics for Dental procdeures after total joint surgery are not necessary.  Exceptions are as follows:  1. History of prior total joint infection  2. Severely immunocompromised (Organ Transplant, cancer chemotherapy, Rheumatoid biologic meds such as [place])  3. Poorly controlled diabetes (A1C &gt; 8.0, blood glucose over 200)  If you have one of these conditions, contact your surgeon for an antibiotic prescription, prior to your dental procedure.   MAKE SURE YOU:   Understand these instructions.   Get help right away if you are not doing well or get worse.    Thank you for letting us be a part of your medical care team.  It is a privilege we respect greatly.  We hope these instructions will help you stay on track for a fast and full recovery!

## 2020-04-21 NOTE — Transfer of Care (Signed)
Immediate Anesthesia Transfer of Care Note  Patient: Rebecca [name] [facility]  Procedure(s) Performed: UNICOMPARTMENTAL KNEE (Right Knee)  Patient Location: PACU  Anesthesia Type:GA combined with regional for post-op pain  Level of Consciousness: awake  Airway & Oxygen Therapy: Patient Spontanous Breathing and Patient connected to face mask oxygen  Post-op Assessment: Report given to RN and Post -op Vital signs reviewed and stable  Post vital signs: Reviewed and stable  Last Vitals:  Vitals Value Taken Time  BP 151/70 04/21/20 1012  Temp    Pulse 90 04/21/20 1013  Resp 14 04/21/20 1013  SpO2 100 % 04/21/20 1013  Vitals shown include unvalidated device data.  Last Pain:  Vitals:   04/21/20 0539  PainSc: 8          Complications: No complications documented.

## 2020-04-21 NOTE — Op Note (Signed)
04/21/2020  10:46 AM  PATIENT:  Rebecca [name]    PRE-OPERATIVE DIAGNOSIS:  Right knee primary localized osteoarthritis  POST-OPERATIVE DIAGNOSIS:  Same  PROCEDURE:  Unicompartmental Knee Arthroplasty  SURGEON:  Johnny Bridge, MD  PHYSICIAN ASSISTANT: Aggie Moats, PA-C, present and scrubbed throughout the case, critical for completion in a timely fashion, and for retraction, instrumentation, and closure.  ANESTHESIA:   general  ESTIMATED BLOOD LOSS: 199ml  UNIQUE ASPECTS OF THE CASE:  The tibial cut must have been a little thick, even though it was based off the jig.  The MCL was intact, directly at the midline. It took on good tension.  The lateral tibial plateau had a little indentation from the extrication of the posterior part of the tibial plateau from the cut.  I suspect there were a couple of fibers of the PCL still attached to the resection posteriorly.  The seven restored ligamentous tension symmetrically.   PREOPERATIVE INDICATIONS:  Rebecca [name] is a  80 y.o. female with a diagnosis of djd right knee who failed conservative measures and elected for surgical management.    The risks benefits and alternatives were discussed with the patient preoperatively including but not limited to the risks of infection, bleeding, nerve injury, cardiopulmonary complications, blood clots, the need for revision surgery, among others, and the patient was willing to proceed.  OPERATIVE IMPLANTS: Biomet [place] mobile bearing medial compartment arthroplasty femur size small, tibia size A, bearing size 7.  OPERATIVE FINDINGS: Endstage grade 4 medial compartment osteoarthritis. No significant changes in the lateral or patellofemoral joint.  The ACL was intact.  OPERATIVE PROCEDURE: The patient was brought to the operating room placed in the supine position. Anesthesia was administered. IV antibiotics were given. The lower extremity was placed in the legholder and prepped and draped in  usual sterile fashion.  Time out was performed.  The leg was elevated and exsanguinated and the tourniquet was inflated. Anteromedial incision was performed, and I took care to preserve the MCL. Parapatellar incision was carried out, and the osteophytes were excised, along with the medial meniscus and a small portion of the fat pad.  The extra medullary tibial cutting jig was applied, using the spoon and the 83mm G-Clamp and the 2 mm shim, and I took care to protect the anterior cruciate ligament insertion and the tibial spine. The medial collateral ligament was also protected, and I resected my proximal tibia, matching the anatomic slope.   The proximal tibial bony cut was removed in one piece, and I turned my attention to the femur.  The intramedullary femoral rod was placed using the drill, and then using the appropriate reference, I assembled the femoral jig, setting my posterior cutting block. I resected my posterior femur, used the 0 spigot for the anterior femur, and then measured my gap.   I then used the appropriate mill to match the extension gap to the flexion gap. The second milling was at a 3 (7-4).  The gaps were then measured again with the appropriate feeler gauges. Once I had balanced flexion and extension gaps, I then completed the preparation of the femur.  I milled off the anterior aspect of the distal femur to prevent impingement. I also exposed the tibia, and selected the above-named component, and then used the cutting jig to prepare the keel slot on the tibia. I also used the awl to curette out the bone to complete the preparation of the keel. The back wall was intact.  I  then placed trial components, and it was found to have excellent motion, and appropriate balance.  I then cemented the components into place, cementing the tibia first, removing all excess cement, and then cementing the femur.  All loose cement was removed.  The real polyethylene insert was applied  manually, and the knee was taken through functional range of motion, and found to have excellent stability and restoration of joint motion, with excellent balance.  The wounds were irrigated copiously, and the parapatellar tissue closed with Vicryl, followed by Vicryl for the subcutaneous tissue, with routine closure with Steri-Strips and sterile gauze.  The tourniquet was released, and the patient was awakened and extubated and returned to PACU in stable and satisfactory condition. There were no complications.

## 2020-04-21 NOTE — Anesthesia Procedure Notes (Signed)
Procedure Name: LMA Insertion Date/Time: 04/21/2020 7:35 AM Performed by: Sharlette Dense, CRNA Patient Re-evaluated:Patient Re-evaluated prior to induction Oxygen Delivery Method: Circle system utilized Preoxygenation: Pre-oxygenation with 100% oxygen Induction Type: IV induction LMA: LMA inserted LMA Size: 3.0 Number of attempts: 1 Placement Confirmation: positive ETCO2 and breath sounds checked- equal and bilateral Dental Injury: Teeth and Oropharynx as per pre-operative assessment

## 2020-04-21 NOTE — Interval H&P Note (Signed)
History and Physical Interval Note:  04/21/2020 7:23 AM  [name]  has presented today for surgery, with the diagnosis of djd right knee.  The various methods of treatment have been discussed with the patient and family. After consideration of risks, benefits and other options for treatment, the patient has consented to  Procedure(s): UNICOMPARTMENTAL KNEE (Right) as a surgical intervention.  The patient's history has been reviewed, patient examined, no change in status, stable for surgery.  I have reviewed the patient's chart and labs.  Questions were answered to the patient's satisfaction.     Rebecca [name]

## 2020-04-21 NOTE — Anesthesia Postprocedure Evaluation (Signed)
Anesthesia Post Note  Patient: Rebecca [name] [facility]  Procedure(s) Performed: UNICOMPARTMENTAL KNEE (Right Knee)     Patient location during evaluation: PACU Anesthesia Type: Regional and General Level of consciousness: awake and alert, oriented and patient cooperative Pain management: pain level controlled Vital Signs Assessment: post-procedure vital signs reviewed and stable Respiratory status: spontaneous breathing, nonlabored ventilation and respiratory function stable Cardiovascular status: blood pressure returned to baseline and stable Postop Assessment: no apparent nausea or vomiting Anesthetic complications: no   No complications documented.  Last Vitals:  Vitals:   04/21/20 1030 04/21/20 1045  BP: 139/66 140/69  Pulse: 93 86  Resp: 13 (!) 21  Temp:  36.6 C  SpO2: 92% 95%    Last Pain:  Vitals:   04/21/20 1045  PainSc: 0-No pain                 Pervis Hocking

## 2020-04-21 NOTE — Progress Notes (Signed)
Present on discharge wound not removed,was removed in computer in error.

## 2020-04-22 ENCOUNTER — Encounter (HOSPITAL_COMMUNITY): Payer: Self-pay | Admitting: Orthopedic Surgery

## 2020-05-03 ENCOUNTER — Telehealth: Payer: Self-pay | Admitting: Internal Medicine

## 2020-05-04 DIAGNOSIS — M1711 Unilateral primary osteoarthritis, right knee: Secondary | ICD-10-CM | POA: Diagnosis not present

## 2020-05-04 NOTE — Telephone Encounter (Signed)
PDMP okay, Rx sent 

## 2020-05-04 NOTE — Telephone Encounter (Signed)
Requesting: clonazepam 0.5mg  Contract: 07/24/2017 UDS: 09/10/2019 Last Visit: 04/01/20 Next Visit: 09/10/2020 Last Refill: 02/03/2020 #270 and 0RF Pt sig: 1 tab tid prn  Please Advise

## 2020-07-27 ENCOUNTER — Telehealth: Payer: Self-pay | Admitting: Pharmacist

## 2020-07-27 NOTE — Chronic Care Management (AMB) (Signed)
    Chronic Care Management Pharmacy Assistant   Name: DANISHA BRASSFIELD  MRN: [id number] DOB: September 03, 1940   Reason for Encounter: Disease State General Adherence call    Recent office visits:  04/01/2020 Kathlene November, MD (PCP) Seen for surgical clearance. Recommend to stop NSAIDs. Tylenol  500 mg OTC 2 tabs a day every 8 hours as needed for pain. Flu shot given. Labs ordered.  Recent consult visits:  None noted  Hospital visits:  None in previous 6 months  Medications: Outpatient Encounter Medications as of 07/27/2020  Medication Sig  . acetaminophen (TYLENOL) 500 MG tablet Take 1,000 mg by mouth in the morning and at bedtime.  Marland Kitchen aspirin EC 325 MG tablet Take 1 tablet (325 mg total) by mouth 2 (two) times daily. For DVT prophylaxis  . clonazePAM (KLONOPIN) 0.5 MG tablet TAKE 1 TABLET(0.5 MG) BY MOUTH THREE TIMES DAILY AS NEEDED FOR ANXIETY  . escitalopram (LEXAPRO) 5 MG tablet TAKE 1 TABLET(5 MG) BY MOUTH DAILY (Patient taking differently: Take 5 mg by mouth daily.)  . Multiple Vitamin (MULTIVITAMIN) tablet Take 1 tablet by mouth daily. Reported on 08/25/2015  . ondansetron (ZOFRAN) 4 MG tablet Take 1 tablet (4 mg total) by mouth every 8 (eight) hours as needed for nausea or vomiting.  . pantoprazole (PROTONIX) 40 MG tablet Take 1 tablet (40 mg total) by mouth daily before breakfast.  . Propylene Glycol (SYSTANE BALANCE) 0.6 % SOLN Place 1 drop into both eyes daily.  . sennosides-docusate sodium (SENOKOT-S) 8.6-50 MG tablet Take 2 tablets by mouth daily. While taking pain medication  . simvastatin (ZOCOR) 40 MG tablet Take 1 tablet (40 mg total) by mouth at bedtime.  . vitamin C (ASCORBIC ACID) 500 MG tablet Take 500 mg by mouth daily.  Marland Kitchen zinc gluconate 50 MG tablet Take 50 mg by mouth daily.   No facility-administered encounter medications on file as of 07/27/2020.   Have you had any problems recently with your health? Patient states she is not having any concerns at this  time.  Have you had any problems with your pharmacy? Patient states she has not had any issues with her pharmacy.  What issues or side effects are you having with your medications? Patient states she has not had any side effect with her medications.  What would you like me to pass along to [place] for them to help you with?  Patient states there is nothing at this time.  What can we do to take care of you better? Patient states there is nothing at this time.   Star Rating Drugs: Simvastatin 40 mg Last filled: 07/24/2020 90DS  [facility] Clinical Pharmacist Assistant [phone]

## 2020-09-10 ENCOUNTER — Ambulatory Visit (INDEPENDENT_AMBULATORY_CARE_PROVIDER_SITE_OTHER): Payer: Medicare Other | Admitting: Internal Medicine

## 2020-09-10 ENCOUNTER — Other Ambulatory Visit: Payer: Self-pay

## 2020-09-10 ENCOUNTER — Encounter: Payer: Self-pay | Admitting: Internal Medicine

## 2020-09-10 ENCOUNTER — Ambulatory Visit: Payer: Medicare Other | Attending: Internal Medicine

## 2020-09-10 VITALS — BP 142/80 | HR 71 | Temp 98.0°F | Resp 16 | Ht 60.0 in | Wt 131.0 lb

## 2020-09-10 DIAGNOSIS — Z0000 Encounter for general adult medical examination without abnormal findings: Secondary | ICD-10-CM

## 2020-09-10 DIAGNOSIS — F32A Depression, unspecified: Secondary | ICD-10-CM

## 2020-09-10 DIAGNOSIS — Z79899 Other long term (current) drug therapy: Secondary | ICD-10-CM

## 2020-09-10 DIAGNOSIS — I10 Essential (primary) hypertension: Secondary | ICD-10-CM

## 2020-09-10 DIAGNOSIS — E785 Hyperlipidemia, unspecified: Secondary | ICD-10-CM

## 2020-09-10 DIAGNOSIS — Z23 Encounter for immunization: Secondary | ICD-10-CM

## 2020-09-10 DIAGNOSIS — Z0001 Encounter for general adult medical examination with abnormal findings: Secondary | ICD-10-CM

## 2020-09-10 DIAGNOSIS — F419 Anxiety disorder, unspecified: Secondary | ICD-10-CM

## 2020-09-10 DIAGNOSIS — R634 Abnormal weight loss: Secondary | ICD-10-CM

## 2020-09-10 LAB — LIPID PANEL
Cholesterol: 144 mg/dL (ref 0–200)
HDL: 53.3 mg/dL (ref >39.00)
LDL Cholesterol: 69 mg/dL (ref 0–99)
NonHDL: 90.79
Total CHOL/HDL Ratio: 3
Triglycerides: 110 mg/dL (ref 0.0–149.0)
VLDL: 22 mg/dL (ref 0.0–40.0)

## 2020-09-10 LAB — CBC WITH DIFFERENTIAL/PLATELET
Basophils Absolute: 0 10*3/uL (ref 0.0–0.1)
Basophils Relative: 0.5 % (ref 0.0–3.0)
Eosinophils Absolute: 0.1 10*3/uL (ref 0.0–0.7)
Eosinophils Relative: 2 % (ref 0.0–5.0)
HCT: 37.7 % (ref 36.0–46.0)
Hemoglobin: 12.7 g/dL (ref 12.0–15.0)
Lymphocytes Relative: 23.2 % (ref 12.0–46.0)
Lymphs Abs: 1.2 10*3/uL (ref 0.7–4.0)
MCHC: 33.6 g/dL (ref 30.0–36.0)
MCV: 95.1 fl (ref 78.0–100.0)
Monocytes Absolute: 0.4 10*3/uL (ref 0.1–1.0)
Monocytes Relative: 7.4 % (ref 3.0–12.0)
Neutro Abs: 3.5 10*3/uL (ref 1.4–7.7)
Neutrophils Relative %: 66.9 % (ref 43.0–77.0)
Platelets: 126 10*3/uL — ABNORMAL LOW (ref 150.0–400.0)
RBC: 3.96 Mil/uL (ref 3.87–5.11)
RDW: 13.6 % (ref 11.5–15.5)
WBC: 5.3 10*3/uL (ref 4.0–10.5)

## 2020-09-10 LAB — TSH: TSH: 2.71 u[IU]/mL (ref 0.35–5.50)

## 2020-09-10 LAB — COMPREHENSIVE METABOLIC PANEL
ALT: 21 U/L (ref 0–35)
AST: 27 U/L (ref 0–37)
Albumin: 4.4 g/dL (ref 3.5–5.2)
Alkaline Phosphatase: 50 U/L (ref 39–117)
BUN: 22 mg/dL (ref 6–23)
CO2: 29 mEq/L (ref 19–32)
Calcium: 10.3 mg/dL (ref 8.4–10.5)
Chloride: 105 mEq/L (ref 96–112)
Creatinine, Ser: 1.03 mg/dL (ref 0.40–1.20)
GFR: 51.44 mL/min — ABNORMAL LOW (ref >60.00)
Glucose, Bld: 89 mg/dL (ref 70–99)
Potassium: 3.5 mEq/L (ref 3.5–5.1)
Sodium: 143 mEq/L (ref 135–145)
Total Bilirubin: 0.8 mg/dL (ref 0.2–1.2)
Total Protein: 7 g/dL (ref 6.0–8.3)

## 2020-09-10 MED ORDER — ESCITALOPRAM OXALATE 10 MG PO TABS: 10.0000 mg | ORAL_TABLET | Freq: Every day | ORAL | 3 refills | Status: DC
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
## 2020-09-10 NOTE — Progress Notes (Signed)
   Covid-19 Vaccination Clinic  Name:  Rebecca [name]    MRN: [id number] DOB: Dec 12, 1940  09/10/2020  Ms. Catania was observed post Covid-19 immunization for 15 minutes without incident. She was provided with Vaccine Information Sheet and instruction to access the V-Safe system.   Ms. Schlotterbeck was instructed to call 911 with any severe reactions post vaccine: Difficulty breathing  Swelling of face and throat  A fast heartbeat  A bad rash all over body  Dizziness and weakness   Immunizations Administered     Name Date Dose VIS Date Route   Moderna Covid-19 Booster Vaccine 09/10/2020 11:21 AM 0.25 mL 01/01/2020 Intramuscular   Manufacturer: Moderna   Lot: [id number]   [place]: [id number]

## 2020-09-10 NOTE — Patient Instructions (Addendum)
Proceed with a COVID vaccination at the first floor today  Consider getting the shingles shot  Get flu shot every fall  Check the  blood pressure 2 or 3 times a month week monthly weekly daily BP GOAL is between 110/65 and  135/85. If it is consistently higher or lower, let me know  Increase Lexapro to 10 mg daily  GO TO THE LAB : Get the blood work     [place], [place] back for a checkup in 3 months

## 2020-09-11 ENCOUNTER — Other Ambulatory Visit (HOSPITAL_BASED_OUTPATIENT_CLINIC_OR_DEPARTMENT_OTHER): Payer: Self-pay

## 2020-09-11 MED ORDER — COVID-19 MRNA VACC (MODERNA) 100 MCG/0.5ML IM SUSP
INTRAMUSCULAR | 0 refills | Status: DC
  Filled 2020-09-11: qty 0.25, 1d supply, fill #0

## 2020-09-12 ENCOUNTER — Encounter: Payer: Self-pay | Admitting: Internal Medicine

## 2020-09-12 NOTE — Assessment & Plan Note (Signed)
Here for CPX Hyperlipidemia: On simvastatin, checking labs Anxiety, depression: Still an issue, related to the loss of her husband, also unfortunately has some difficulties with one of her children. She is doing counseling with hospice, in the last few weeks she noticed her mood has improved. Plan: Increase Lexapro 5mg  to 10 mg, continue clonazepam, UDS, reassess in 3 months. Weight loss: She thinks is related to not eating well due to anxiety and depression, I agree, reassess in 3 months. MSK: Had a partial knee replacement, R, seems to be doing great. RTC 3 months.

## 2020-09-12 NOTE — Assessment & Plan Note (Signed)
---  Td:  05-2016; pnm 23: 2008, 07/2017; prevnar 2015; zostavax:  12-2009 - shingrix discussed: rec to get at the pharmacy - covid vax:x2, rec booster  - rec flu shot q year ---CCS:   normal  Cscope  2003, Cscope 2-14 no polyps; next cscope optional ---PAPs no longer indicated ---Breast ca screening:  (-) FH, MMG 09/2019 per kpn -- (+) FH CAD, pt asx, on statins ---Palpable aorta: [place] Ao 03-2015 wnl --Diet and exercise discussed  -- POA on file  -- labs:  CMP, FLP, CBC, TSH

## 2020-09-20 LAB — DRUG MONITORING, PANEL 8 WITH CONFIRMATION, URINE
6 Acetylmorphine: NEGATIVE ng/mL (ref <10)
Alcohol Metabolites: NEGATIVE ng/mL (ref <500)
Alphahydroxyalprazolam: NEGATIVE ng/mL (ref <25)
Alphahydroxymidazolam: NEGATIVE ng/mL (ref <50)
Alphahydroxytriazolam: NEGATIVE ng/mL (ref <50)
Aminoclonazepam: 512 ng/mL — ABNORMAL HIGH (ref <25)
Amphetamines: NEGATIVE ng/mL (ref <500)
Benzodiazepines: POSITIVE ng/mL — AB (ref <100)
Buprenorphine, Urine: NEGATIVE ng/mL (ref <5)
Cocaine Metabolite: NEGATIVE ng/mL (ref <150)
Creatinine: 106.6 mg/dL (ref >=20.0)
Hydroxyethylflurazepam: NEGATIVE ng/mL (ref <50)
Lorazepam: NEGATIVE ng/mL (ref <50)
MDMA: NEGATIVE ng/mL (ref <500)
Marijuana Metabolite: NEGATIVE ng/mL (ref <20)
Nordiazepam: NEGATIVE ng/mL (ref <50)
Opiates: NEGATIVE ng/mL (ref <100)
Oxazepam: NEGATIVE ng/mL (ref <50)
Oxidant: NEGATIVE ug/mL (ref <200)
Oxycodone: NEGATIVE ng/mL (ref <100)
Temazepam: NEGATIVE ng/mL (ref <50)
pH: 5.9 (ref 4.5–9.0)

## 2020-09-20 LAB — DM TEMPLATE

## 2020-10-12 DIAGNOSIS — H43393 Other vitreous opacities, bilateral: Secondary | ICD-10-CM | POA: Diagnosis not present

## 2020-10-12 DIAGNOSIS — H40033 Anatomical narrow angle, bilateral: Secondary | ICD-10-CM | POA: Diagnosis not present

## 2020-10-13 ENCOUNTER — Telehealth: Payer: Medicare Other

## 2020-10-21 ENCOUNTER — Other Ambulatory Visit: Payer: Self-pay

## 2020-10-21 ENCOUNTER — Telehealth: Payer: Self-pay

## 2020-10-21 MED ORDER — SIMVASTATIN 40 MG PO TABS: 40.0000 mg | ORAL_TABLET | Freq: Every day | ORAL | 1 refills | Status: DC

## 2020-10-21 NOTE — Telephone Encounter (Signed)
Requesting: clonazepam 0.'5mg'$  Contract: 09/10/2020 UDS: 09/10/2020 Last Visit: 09/10/2020 Next Visit: 12/10/2020 Last Refill: 05/04/2020 #270 and 1RF  Please Advise

## 2020-10-21 NOTE — Telephone Encounter (Signed)
PDMP reviewed, is 2 weeks to early

## 2020-10-21 NOTE — Telephone Encounter (Signed)
Noted  

## 2020-11-03 ENCOUNTER — Telehealth: Payer: Self-pay | Admitting: Internal Medicine

## 2020-11-04 NOTE — Telephone Encounter (Signed)
PDMP okay, prescription sent 

## 2020-11-04 NOTE — Telephone Encounter (Signed)
Requesting: clonazepam 0.'5mg'$  Contract: 09/10/2020 UDS: 09/10/2020 Last Visit: 09/10/2020 Next Visit: 12/10/2020 Last Refill: 05/04/2020 #270 and 1RF   Please Advise

## 2020-12-10 ENCOUNTER — Encounter: Payer: Self-pay | Admitting: Internal Medicine

## 2020-12-10 ENCOUNTER — Ambulatory Visit (INDEPENDENT_AMBULATORY_CARE_PROVIDER_SITE_OTHER): Payer: Medicare Other | Admitting: Internal Medicine

## 2020-12-10 ENCOUNTER — Other Ambulatory Visit: Payer: Self-pay

## 2020-12-10 VITALS — BP 142/78 | HR 70 | Temp 97.8°F | Resp 18 | Ht 60.0 in | Wt 125.2 lb

## 2020-12-10 DIAGNOSIS — F419 Anxiety disorder, unspecified: Secondary | ICD-10-CM

## 2020-12-10 DIAGNOSIS — F32A Depression, unspecified: Secondary | ICD-10-CM | POA: Diagnosis not present

## 2020-12-10 DIAGNOSIS — R634 Abnormal weight loss: Secondary | ICD-10-CM | POA: Diagnosis not present

## 2020-12-10 DIAGNOSIS — I10 Essential (primary) hypertension: Secondary | ICD-10-CM

## 2020-12-10 DIAGNOSIS — Z23 Encounter for immunization: Secondary | ICD-10-CM | POA: Diagnosis not present

## 2020-12-10 MED ORDER — BUPROPION HCL 100 MG PO TABS: 100.0000 mg | ORAL_TABLET | Freq: Two times a day (BID) | ORAL | 3 refills | Status: DC

## 2020-12-10 NOTE — Progress Notes (Signed)
Subjective:    Patient ID: Rebecca [name], female    DOB: [birth year], 80 y.o.   MRN: [id number]  DOS:  12/10/2020 Type of visit - description: Follow-up Here for follow-up on depression. Since the last office visit she is not doing better, sxs about the same, still very sad and depressed.  Denies any suicidal ideas.  I noted ongoing weight loss: Denies fever, chills or night sweats Appetite is poor No myalgias, headache or visual disturbances No nausea, vomiting or blood in the stools. No blood in the urine She feels somewhat tired mostly in the mornings, the rest of the day is okay.  Wt Readings from Last 3 Encounters:  12/10/20 125 lb 4 oz (56.8 kg)  09/10/20 131 lb (59.4 kg)  04/21/20 157 lb (71.2 kg)     Review of Systems See above   Past Medical History:  Diagnosis Date   Anxiety    Arthritis    degenerative lumbar spine    Chronic neck pain    Closed fracture of base of fifth metatarsal bone 26/3335   Left   Complication of anesthesia    at one time my blood pressure dropped -lumbar - on bp med at that time   Depression    Dysphagia    esophageal stretched 9-14    History of blood transfusion 2009   post - lumbar fusion    Hyperlipidemia    Hypertension    No longer takes med.   Osteoarthritis of left shoulder region    Mild, Dr. Mardelle Matte   Osteoporosis     Past Surgical History:  Procedure Laterality Date   ABDOMINAL HYSTERECTOMY     ANTERIOR CERVICAL DECOMP/DISCECTOMY FUSION N/A 11/20/2014   Procedure: ANTERIOR CERVICAL DECOMPRESSION/DISCECTOMY FUSION 2 LEVELS;  Surgeon: Phylliss Bob, MD;  Location: [facility];  Service: Orthopedics;  Laterality: N/A;  Anterior cervical decompression fusion, cervical 3-4, cervical 4-5 with instrumentation and allograft   arm surgery Bilateral    transposed ulner nerve 10 (dr. Amedeo Plenty)   BACK SURGERY  2009   Dr Jeralyn Bennett SURGERY  01-2014   Dr Saintclair Halsted   carpal tunnell  2006   bilateral   CHOLECYSTECTOMY     KNEE  SURGERY  9-11   right,  scope; left knee   NECK SURGERY  05/2008   Dr Saintclair Halsted   OOPHORECTOMY Bilateral    PARTIAL KNEE ARTHROPLASTY  01/17/2012   LEFT KNEE   PARTIAL KNEE ARTHROPLASTY  01/17/2012   Procedure: UNICOMPARTMENTAL KNEE;  Surgeon: Johnny Bridge, MD;  Location: [facility];  Service: Orthopedics;  Laterality: Left;   PARTIAL KNEE ARTHROPLASTY Right 04/21/2020   Procedure: UNICOMPARTMENTAL KNEE;  Surgeon: Marchia Bond, MD;  Location: [facility];  Service: Orthopedics;  Laterality: Right;   RHINOPLASTY  1980   SHOULDER SURGERY Bilateral    x 2/Dr. [place]   TONSILLECTOMY      Allergies as of 12/10/2020       Reactions   Tape Other (See Comments)   White adhesive tape -blisters   Alendronate Sodium Other (See Comments)   REACTION: jaw pain   Cortizone-10 [hydrocortisone] Other (See Comments)   Pt received cortizone shot, and became very flushed, and hot, blisters, raw areas around lips & ears    Oxycodone-acetaminophen Hives   Penicillins Swelling, Rash        Medication List        Accurate as of December 10, 2020 10:02 AM. If you have any questions, ask your  nurse or doctor.          STOP taking these medications    Moderna COVID-19 Vaccine 100 MCG/0.5ML injection Generic drug: COVID-19 mRNA vaccine (Moderna) Stopped by: Kathlene November, MD       TAKE these medications    acetaminophen 500 MG tablet Commonly known as: TYLENOL Take 1,000 mg by mouth in the morning and at bedtime.   clonazePAM 0.5 MG tablet Commonly known as: KLONOPIN TAKE 1 TABLET(0.5 MG) BY MOUTH THREE TIMES DAILY AS NEEDED FOR ANXIETY   escitalopram 10 MG tablet Commonly known as: Lexapro Take 1 tablet (10 mg total) by mouth daily.   multivitamin tablet Take 1 tablet by mouth daily. Reported on 08/25/2015   pantoprazole 40 MG tablet Commonly known as: PROTONIX Take 1 tablet (40 mg total) by mouth daily before breakfast.   sennosides-docusate sodium 8.6-50 MG tablet Commonly known as:  SENOKOT-S Take 2 tablets by mouth daily. While taking pain medication   simvastatin 40 MG tablet Commonly known as: ZOCOR Take 1 tablet (40 mg total) by mouth at bedtime.   Systane Balance 0.6 % Soln Generic drug: Propylene Glycol Place 1 drop into both eyes daily.   vitamin C 500 MG tablet Commonly known as: ASCORBIC ACID Take 500 mg by mouth daily.   zinc gluconate 50 MG tablet Take 50 mg by mouth daily.           Objective:   Physical Exam BP (!) 142/78 (BP Location: Left Arm, Patient Position: Sitting, Cuff Size: Small)   Pulse 70   Temp 97.8 F (36.6 C) (Oral)   Resp 18   Ht 5' (1.524 m)   Wt 125 lb 4 oz (56.8 kg)   SpO2 98%   BMI 24.46 kg/m  General:   Well developed, NAD, BMI noted.  HEENT:  Normocephalic . Face symmetric, atraumatic Lymphatic system: No neck or axillary lymphadenopathies Neck: No thyromegaly Lungs:  CTA B Normal respiratory effort, no intercostal retractions, no accessory muscle use. Heart: RRR,  no murmur.  Abdomen:  Not distended, soft, non-tender. No rebound or rigidity.  No organomegaly that I can tell. Skin: Not pale. Not jaundice Lower extremities: no pretibial edema bilaterally  Neurologic:  alert & oriented X3.  Speech normal, gait appropriate for age and unassisted Psych--  Cognition and judgment appear intact.  Cooperative with normal attention span and concentration.  Behavior appropriate. No anxious or depressed appearing.     Assessment     Assessment  HTN -- on no med since ~ 2009 Hyperlipidemia Anxiety depression: onset Fall 2016 d/t husband health, lost sister 03-2015. rx lexapro 02-2015 GI --GERD --Esophageal stricture stretched 2014  --EGD/2017--->  gastritis, bx---reactive gastropathy, no H. pylori MSK: --DJD; Chronic neck pain ;  spinal stenosis;  multiple surgeries Osteoporosis DEXA osteoprosis 2006  (per pt) DEXA  11-08 normal   DEXA  9-12 mild osteopenia, rec exercise, ca and vit D T score  05-2015:  (-) 1.4 T score 06/19/2017:  (-) 2.1, RX  vit d, exercise , recheck in 2 years T score -1.8   08/2019 Ao [place] 03-2015 - no AAA  PLAN: Anxiety, depression: Not well controlled.  Currently on Lexapro 10 mg and clonazepam on average twice a day for  anxiety. Symptoms related to the loss of her husband, also she hardly ever hears from  one of her children and that makes her very sad and worried. Plan: Rec to see new psychotherapist, she does not enjoying the group therapy with  other patients from hospice.  Information about new psychotherapists provided. Add Wellbutrin see AVS. Weight loss: Ongoing, recent labs okay, ROS negative, physical exam with no worrisome findings.  Suspect is related to anxiety and depression, reassess in 2 months. Preventive care: Flu shot today Breast cancer screening: No FH, never had an abnormal MMG, does not like to pursue further mammograms which is reasonable. RTC 2 months  Time spent 35 minutes, listening therapy provided, chart reviewed regards weight loss.  This visit occurred during the SARS-CoV-2 public health emergency.  Safety protocols were in place, including screening questions prior to the visit, additional usage of staff PPE, and extensive cleaning of exam room while observing appropriate contact time as indicated for disinfecting solutions.

## 2020-12-10 NOTE — Assessment & Plan Note (Signed)
Anxiety, depression: Not well controlled.  Currently on Lexapro 10 mg and clonazepam on average twice a day for  anxiety. Symptoms related to the loss of her husband, also she hardly ever hears from  one of her children and that makes her very sad and worried. Plan: Rec to see new psychotherapist, she does not enjoying the group therapy with other patients from hospice.  Information about new psychotherapists provided. Add Wellbutrin see AVS. Weight loss: Ongoing, recent labs okay, ROS negative, physical exam with no worrisome findings.  Suspect is related to anxiety and depression, reassess in 2 months. Preventive care: Flu shot today Breast cancer screening: No FH, never had an abnormal MMG, does not like to pursue further mammograms which is reasonable. RTC 2 months

## 2020-12-10 NOTE — Patient Instructions (Addendum)
Recommend to proceed with the following vaccines at your pharmacy:  Shingrix (shingles) Covid #4  Please add a medication called Wellbutrin (bupropion) 100 mg: Take 1 tablet in the morning for the first 10 days and then 1 tablet twice a day.  See a new counselor.     GO TO THE FRONT DESK, PLEASE SCHEDULE YOUR APPOINTMENTS Come back for a checkup in 2 months

## 2020-12-28 DIAGNOSIS — M1711 Unilateral primary osteoarthritis, right knee: Secondary | ICD-10-CM | POA: Diagnosis not present

## 2021-01-10 ENCOUNTER — Other Ambulatory Visit: Payer: Self-pay | Admitting: Internal Medicine

## 2021-02-10 ENCOUNTER — Other Ambulatory Visit: Payer: Self-pay

## 2021-02-10 ENCOUNTER — Ambulatory Visit (INDEPENDENT_AMBULATORY_CARE_PROVIDER_SITE_OTHER): Payer: Medicare Other | Admitting: Internal Medicine

## 2021-02-10 ENCOUNTER — Ambulatory Visit (HOSPITAL_BASED_OUTPATIENT_CLINIC_OR_DEPARTMENT_OTHER)
Admission: RE | Admit: 2021-02-10 | Discharge: 2021-02-10 | Disposition: A | Discharge status: Home / Self Care | Payer: Medicare Other | Source: Ambulatory Visit | Attending: Internal Medicine | Admitting: Internal Medicine

## 2021-02-10 ENCOUNTER — Encounter: Payer: Self-pay | Admitting: Internal Medicine

## 2021-02-10 VITALS — BP 126/80 | HR 79 | Temp 97.9°F | Resp 18 | Ht 60.0 in | Wt 123.5 lb

## 2021-02-10 DIAGNOSIS — R634 Abnormal weight loss: Secondary | ICD-10-CM | POA: Diagnosis not present

## 2021-02-10 DIAGNOSIS — R911 Solitary pulmonary nodule: Secondary | ICD-10-CM

## 2021-02-10 DIAGNOSIS — F419 Anxiety disorder, unspecified: Secondary | ICD-10-CM

## 2021-02-10 DIAGNOSIS — D61818 Other pancytopenia: Secondary | ICD-10-CM | POA: Diagnosis not present

## 2021-02-10 DIAGNOSIS — F32A Depression, unspecified: Secondary | ICD-10-CM | POA: Diagnosis not present

## 2021-02-10 DIAGNOSIS — R7989 Other specified abnormal findings of blood chemistry: Secondary | ICD-10-CM

## 2021-02-10 DIAGNOSIS — R54 Age-related physical debility: Secondary | ICD-10-CM

## 2021-02-10 DIAGNOSIS — R918 Other nonspecific abnormal finding of lung field: Secondary | ICD-10-CM

## 2021-02-10 MED ORDER — BUPROPION HCL 100 MG PO TABS: 100.0000 mg | ORAL_TABLET | Freq: Two times a day (BID) | ORAL | 1 refills | Status: DC

## 2021-02-10 MED ORDER — ESCITALOPRAM OXALATE 10 MG PO TABS: ORAL_TABLET | ORAL | 1 refills | Status: DC

## 2021-02-10 NOTE — Progress Notes (Signed)
Subjective:    Patient ID: Rebecca [name], female    DOB: [birth year], 80 y.o.   MRN: [id number]  DOS:  02/10/2021 Type of visit - description: f/u  Follow-up from previous visit. Anxiety depression: Wellbutrin was added, she has improved. Also, she reach out to her daughter, their relationship is better. Denies insomnia, denies excessive somnolence during the day. No recent falls.  She has lost a couple of additional pounds. Reports that her appetite is okay and she is eating "ok". Denies fever chills or night sweats No lower extremity edema No nausea, vomiting, diarrhea or blood in the stools No gross hematuria No cough    Wt Readings from Last 3 Encounters:  02/10/21 123 lb 8 oz (56 kg)  12/10/20 125 lb 4 oz (56.8 kg)  09/10/20 131 lb (59.4 kg)     Review of Systems See above   Past Medical History:  Diagnosis Date   Anxiety    Arthritis    degenerative lumbar spine    Chronic neck pain    Closed fracture of base of fifth metatarsal bone 40/9811   Left   Complication of anesthesia    at one time my blood pressure dropped -lumbar - on bp med at that time   Depression    Dysphagia    esophageal stretched 9-14    History of blood transfusion 2009   post - lumbar fusion    Hyperlipidemia    Hypertension    No longer takes med.   Osteoarthritis of left shoulder region    Mild, Dr. Mardelle Matte   Osteoporosis     Past Surgical History:  Procedure Laterality Date   ABDOMINAL HYSTERECTOMY     ANTERIOR CERVICAL DECOMP/DISCECTOMY FUSION N/A 11/20/2014   Procedure: ANTERIOR CERVICAL DECOMPRESSION/DISCECTOMY FUSION 2 LEVELS;  Surgeon: Phylliss Bob, MD;  Location: [facility];  Service: Orthopedics;  Laterality: N/A;  Anterior cervical decompression fusion, cervical 3-4, cervical 4-5 with instrumentation and allograft   arm surgery Bilateral    transposed ulner nerve 10 (dr. Amedeo Plenty)   BACK SURGERY  2009   Dr Jeralyn Bennett SURGERY  01-2014   Dr Saintclair Halsted   carpal tunnell   2006   bilateral   CHOLECYSTECTOMY     KNEE SURGERY  9-11   right,  scope; left knee   NECK SURGERY  05/2008   Dr Saintclair Halsted   OOPHORECTOMY Bilateral    PARTIAL KNEE ARTHROPLASTY  01/17/2012   LEFT KNEE   PARTIAL KNEE ARTHROPLASTY  01/17/2012   Procedure: UNICOMPARTMENTAL KNEE;  Surgeon: Johnny Bridge, MD;  Location: [facility];  Service: Orthopedics;  Laterality: Left;   PARTIAL KNEE ARTHROPLASTY Right 04/21/2020   Procedure: UNICOMPARTMENTAL KNEE;  Surgeon: Marchia Bond, MD;  Location: [facility];  Service: Orthopedics;  Laterality: Right;   RHINOPLASTY  1980   SHOULDER SURGERY Bilateral    x 2/Dr. [place]lington   TONSILLECTOMY      Allergies as of 02/10/2021       Reactions   Tape Other (See Comments)   White adhesive tape -blisters   Alendronate Sodium Other (See Comments)   REACTION: jaw pain   Cortizone-10 [hydrocortisone] Other (See Comments)   Pt received cortizone shot, and became very flushed, and hot, blisters, raw areas around lips & ears    Oxycodone-acetaminophen Hives   Penicillins Swelling, Rash        Medication List        Accurate as of February 10, 2021 10:17 AM. If you  have any questions, ask your nurse or doctor.          acetaminophen 500 MG tablet Commonly known as: TYLENOL Take 1,000 mg by mouth in the morning and at bedtime.   buPROPion 100 MG tablet Commonly known as: WELLBUTRIN Take 1 tablet (100 mg total) by mouth 2 (two) times daily.   clonazePAM 0.5 MG tablet Commonly known as: KLONOPIN TAKE 1 TABLET(0.5 MG) BY MOUTH THREE TIMES DAILY AS NEEDED FOR ANXIETY   escitalopram 10 MG tablet Commonly known as: LEXAPRO TAKE 1 TABLET(10 MG) BY MOUTH DAILY   multivitamin tablet Take 1 tablet by mouth daily. Reported on 08/25/2015   pantoprazole 40 MG tablet Commonly known as: PROTONIX Take 1 tablet (40 mg total) by mouth daily before breakfast.   sennosides-docusate sodium 8.6-50 MG tablet Commonly known as: SENOKOT-S Take 2 tablets by mouth  daily. While taking pain medication   simvastatin 40 MG tablet Commonly known as: ZOCOR Take 1 tablet (40 mg total) by mouth at bedtime.   Systane Balance 0.6 % Soln Generic drug: Propylene Glycol Place 1 drop into both eyes daily.   vitamin C 500 MG tablet Commonly known as: ASCORBIC ACID Take 500 mg by mouth daily.   zinc gluconate 50 MG tablet Take 50 mg by mouth daily.           Objective:   Physical Exam BP 126/80 (BP Location: Left Arm, Patient Position: Sitting, Cuff Size: Small)   Pulse 79   Temp 97.9 F (36.6 C) (Oral)   Resp 18   Ht 5' (1.524 m)   Wt 123 lb 8 oz (56 kg)   SpO2 98%   BMI 24.12 kg/m  General:   Well developed, NAD, she looks more frail compared to a year ago HEENT:  Normocephalic . Face symmetric, atraumatic Lymphatic system: No LAD at the neck, supraclavicular-axillary-groin areas  Lungs:  CTA B Normal respiratory effort, no intercostal retractions, no accessory muscle use. Heart: RRR,  no murmur.  Abdomen:  Not distended, soft, non-tender. No rebound or rigidity.   Skin: Not pale. Not jaundice Lower extremities: no pretibial edema bilaterally  Neurologic:  alert & oriented X3.  Speech normal, gait appropriate, needs some help transferring . No tremors psych--  Cognition and judgment appear intact.  Cooperative with normal attention span and concentration.  Behavior appropriate. No anxious or depressed appearing.     Assessment    Assessment  HTN -- on no med since ~ 2009 Hyperlipidemia Anxiety depression: onset Fall 2016 d/t husband health, lost sister 03-2015. rx lexapro 02-2015 GI --GERD --Esophageal stricture stretched 2014  --EGD/2017--->  gastritis, bx---reactive gastropathy, no H. pylori MSK: --DJD; Chronic neck pain ;  spinal stenosis;  multiple surgeries Osteoporosis DEXA osteoprosis 2006  (per pt) DEXA  11-08 normal   DEXA  9-12 mild osteopenia, rec exercise, ca and vit D T score 05-2015:  (-) 1.4 T score  06/19/2017:  (-) 2.1, RX  vit d, exercise , recheck in 2 years T score -1.8   08/2019 Ao [place] 03-2015 - no AAA  PLAN: Anxiety, depression: On Lexapro, clonazepam, Wellbutrin was added last time, she definitely feels better and emotionally looks better. Did not pursue psychotherapy. Did reach out to one of her children, she is now talking with her daughter weekly and she reports that is very helpful. Does not feel excessively sedated.  We will refill medications and continue same management. Weight loss: Still an issue, physical exam and ROS are again benign.  For completeness we will get a CMP, CBC with plethysmograph, TFTs and chest x-ray. Frail: Today she looks more frail compared to a year ago, needed help transferring.  Walking without assistance. RTC 4 months.  This visit occurred during the SARS-CoV-2 public health emergency.  Safety protocols were in place, including screening questions prior to the visit, additional usage of staff PPE, and extensive cleaning of exam room while observing appropriate contact time as indicated for disinfecting solutions.

## 2021-02-10 NOTE — Patient Instructions (Addendum)
Continue the same medicines and call your pharmacy if you need a refill   GO TO THE LAB : Get the blood work     [place], [place] back for a checkup in 4 months     STOP BY THE FIRST FLOOR:  get the XR

## 2021-02-10 NOTE — Assessment & Plan Note (Signed)
Anxiety, depression: On Lexapro, clonazepam, Wellbutrin was added last time, she definitely feels better and emotionally looks better. Did not pursue psychotherapy. Did reach out to one of her children, she is now talking with her daughter weekly and she reports that is very helpful. Does not feel excessively sedated.  We will refill medications and continue same management. Weight loss: Still an issue, physical exam and ROS are again benign. For completeness we will get a CMP, CBC with plethysmograph, TFTs and chest x-ray. Frail: Today she looks more frail compared to a year ago, needed help transferring.  Walking without assistance. RTC 4 months.

## 2021-02-11 LAB — CBC WITH DIFFERENTIAL/PLATELET
Absolute Monocytes: 311 cells/uL (ref 200–950)
Basophils Absolute: 30 cells/uL (ref 0–200)
Basophils Relative: 0.8 %
Eosinophils Absolute: 59 cells/uL (ref 15–500)
Eosinophils Relative: 1.6 %
HCT: 35.9 % (ref 35.0–45.0)
Hemoglobin: 11.9 g/dL (ref 11.7–15.5)
Lymphs Abs: 981 cells/uL (ref 850–3900)
MCH: 32.1 pg (ref 27.0–33.0)
MCHC: 33.1 g/dL (ref 32.0–36.0)
MCV: 96.8 fL (ref 80.0–100.0)
MPV: 10.7 fL (ref 7.5–12.5)
Monocytes Relative: 8.4 %
Neutro Abs: 2320 cells/uL (ref 1500–7800)
Neutrophils Relative %: 62.7 %
Platelets: 114 10*3/uL — ABNORMAL LOW (ref 140–400)
RBC: 3.71 10*6/uL — ABNORMAL LOW (ref 3.80–5.10)
RDW: 12.1 % (ref 11.0–15.0)
Total Lymphocyte: 26.5 %
WBC: 3.7 10*3/uL — ABNORMAL LOW (ref 3.8–10.8)

## 2021-02-11 LAB — COMPREHENSIVE METABOLIC PANEL
AG Ratio: 1.7 (calc) (ref 1.0–2.5)
ALT: 52 U/L — ABNORMAL HIGH (ref 6–29)
AST: 51 U/L — ABNORMAL HIGH (ref 10–35)
Albumin: 4.2 g/dL (ref 3.6–5.1)
Alkaline phosphatase (APISO): 53 U/L (ref 37–153)
BUN/Creatinine Ratio: 21 (calc) (ref 6–22)
BUN: 22 mg/dL (ref 7–25)
CO2: 25 mmol/L (ref 20–32)
Calcium: 9.9 mg/dL (ref 8.6–10.4)
Chloride: 106 mmol/L (ref 98–110)
Creat: 1.06 mg/dL — ABNORMAL HIGH (ref 0.60–0.95)
Globulin: 2.5 g/dL (calc) (ref 1.9–3.7)
Glucose, Bld: 81 mg/dL (ref 65–99)
Potassium: 4.1 mmol/L (ref 3.5–5.3)
Sodium: 142 mmol/L (ref 135–146)
Total Bilirubin: 0.7 mg/dL (ref 0.2–1.2)
Total Protein: 6.7 g/dL (ref 6.1–8.1)

## 2021-02-11 LAB — T4, FREE: Free T4: 1.1 ng/dL (ref 0.8–1.8)

## 2021-02-11 LAB — PATHOLOGIST SMEAR REVIEW

## 2021-02-11 LAB — TSH: TSH: 2.86 mIU/L (ref 0.40–4.50)

## 2021-02-11 LAB — T3, FREE: T3, Free: 2.8 pg/mL (ref 2.3–4.2)

## 2021-02-15 NOTE — Addendum Note (Signed)
Addended byDamita Dunnings D on: 02/15/2021 08:08 AM   Modules accepted: Orders

## 2021-02-16 ENCOUNTER — Telehealth: Payer: Self-pay | Admitting: Physician Assistant

## 2021-02-16 NOTE — Telephone Encounter (Signed)
Scheduled appt per 12/5 referral. Pt is aware of appt date and time. Pt had requested to wait until January for appt due to insurance switching.

## 2021-03-17 ENCOUNTER — Inpatient Hospital Stay: Payer: PPO

## 2021-03-17 ENCOUNTER — Other Ambulatory Visit: Payer: Self-pay

## 2021-03-17 ENCOUNTER — Inpatient Hospital Stay: Payer: PPO | Attending: Physician Assistant | Admitting: Physician Assistant

## 2021-03-17 ENCOUNTER — Encounter: Payer: Self-pay | Admitting: Physician Assistant

## 2021-03-17 VITALS — BP 168/78 | HR 69 | Temp 97.3°F | Resp 17 | Wt 125.0 lb

## 2021-03-17 DIAGNOSIS — F32A Depression, unspecified: Secondary | ICD-10-CM | POA: Diagnosis not present

## 2021-03-17 DIAGNOSIS — F419 Anxiety disorder, unspecified: Secondary | ICD-10-CM | POA: Diagnosis not present

## 2021-03-17 DIAGNOSIS — D696 Thrombocytopenia, unspecified: Secondary | ICD-10-CM | POA: Insufficient documentation

## 2021-03-17 DIAGNOSIS — E785 Hyperlipidemia, unspecified: Secondary | ICD-10-CM | POA: Insufficient documentation

## 2021-03-17 DIAGNOSIS — I10 Essential (primary) hypertension: Secondary | ICD-10-CM | POA: Diagnosis not present

## 2021-03-17 DIAGNOSIS — D72819 Decreased white blood cell count, unspecified: Secondary | ICD-10-CM | POA: Insufficient documentation

## 2021-03-17 DIAGNOSIS — Z79899 Other long term (current) drug therapy: Secondary | ICD-10-CM | POA: Insufficient documentation

## 2021-03-17 LAB — CBC WITH DIFFERENTIAL (CANCER CENTER ONLY)
Abs Immature Granulocytes: 0 10*3/uL (ref 0.00–0.07)
Basophils Absolute: 0 10*3/uL (ref 0.0–0.1)
Basophils Relative: 0 %
Eosinophils Absolute: 0.1 10*3/uL (ref 0.0–0.5)
Eosinophils Relative: 2 %
HCT: 36.7 % (ref 36.0–46.0)
Hemoglobin: 12.1 g/dL (ref 12.0–15.0)
Immature Granulocytes: 0 %
Lymphocytes Relative: 24 %
Lymphs Abs: 1.1 10*3/uL (ref 0.7–4.0)
MCH: 31.8 pg (ref 26.0–34.0)
MCHC: 33 g/dL (ref 30.0–36.0)
MCV: 96.3 fL (ref 80.0–100.0)
Monocytes Absolute: 0.2 10*3/uL (ref 0.1–1.0)
Monocytes Relative: 5 %
Neutro Abs: 3.1 10*3/uL (ref 1.7–7.7)
Neutrophils Relative %: 69 %
Platelet Count: 94 10*3/uL — ABNORMAL LOW (ref 150–400)
RBC: 3.81 MIL/uL — ABNORMAL LOW (ref 3.87–5.11)
RDW: 12.8 % (ref 11.5–15.5)
WBC Count: 4.5 10*3/uL (ref 4.0–10.5)
nRBC: 0 % (ref 0.0–0.2)

## 2021-03-17 LAB — HEPATITIS B SURFACE ANTIBODY,QUALITATIVE: Hep B S Ab: NONREACTIVE

## 2021-03-17 LAB — CMP (CANCER CENTER ONLY)
ALT: 50 U/L — ABNORMAL HIGH (ref 0–44)
AST: 40 U/L (ref 15–41)
Albumin: 4.3 g/dL (ref 3.5–5.0)
Alkaline Phosphatase: 61 U/L (ref 38–126)
Anion gap: 7 (ref 5–15)
BUN: 28 mg/dL — ABNORMAL HIGH (ref 8–23)
CO2: 27 mmol/L (ref 22–32)
Calcium: 9.6 mg/dL (ref 8.9–10.3)
Chloride: 107 mmol/L (ref 98–111)
Creatinine: 1.14 mg/dL — ABNORMAL HIGH (ref 0.44–1.00)
GFR, Estimated: 49 mL/min — ABNORMAL LOW (ref >60)
Glucose, Bld: 89 mg/dL (ref 70–99)
Potassium: 3.9 mmol/L (ref 3.5–5.1)
Sodium: 141 mmol/L (ref 135–145)
Total Bilirubin: 0.7 mg/dL (ref 0.3–1.2)
Total Protein: 7.1 g/dL (ref 6.5–8.1)

## 2021-03-17 LAB — HEPATITIS B CORE ANTIBODY, TOTAL: Hep B Core Total Ab: NONREACTIVE

## 2021-03-17 LAB — HIV ANTIBODY (ROUTINE TESTING W REFLEX): HIV Screen 4th Generation wRfx: NONREACTIVE

## 2021-03-17 LAB — HEPATITIS C ANTIBODY: HCV Ab: NONREACTIVE

## 2021-03-17 LAB — SAVE SMEAR(SSMR), FOR PROVIDER SLIDE REVIEW

## 2021-03-17 LAB — VITAMIN B12: Vitamin B-12: 2378 pg/mL — ABNORMAL HIGH (ref 180–914)

## 2021-03-17 LAB — HEPATITIS B SURFACE ANTIGEN: Hepatitis B Surface Ag: NONREACTIVE

## 2021-03-17 LAB — FOLATE: Folate: 49.7 ng/mL (ref >5.9)

## 2021-03-17 NOTE — Progress Notes (Signed)
[facility] [phone]   [phone] [place] NOTE  Patient Care Team: Colon Branch, MD as PCP - General Kary Kos, MD as Consulting Physician (Neurosurgery) Marchia Bond, MD as Consulting Physician (Orthopedic Surgery) Phylliss Bob, MD as Consulting Physician (Orthopedic Surgery) Constance Haw, MD as Consulting Physician (Cardiology) Day, Melvenia Beam, [facility] (Inactive) as Pharmacist (Pharmacist)  CHIEF COMPLAINTS/PURPOSE OF CONSULTATION:  Thrombocytopenia and leukopenia  HISTORY OF PRESENTING ILLNESS:  [name] 81 y.o. female with medical history significant for arthritis, hypertension, hyperlipidemia, anxiety and depression.  Patient is unaccompanied for this visit.  On review of the previous records, Ms. Gunderman has chronic thrombocytopenia as far back as 2019.  Additionally, patient has transient episodes of leukopenia with a normal differential.  Most recent CBC from 02/10/2021 shows WBC 3.7 and platelets 114K.   On exam today, Ms. Lansing notes fatigue that is predominantly in the morning.  She continues to complete her daily activities on her own.  She reports that she has a stable appetite but notes approximately 60 to 70 pound weight loss over several years.  She reports that she weighed close to 200 pounds many years ago.  She adds that the weight loss was unintentional.  Patient denies nausea, vomiting or abdominal pain.  Patient reports intermittent episodes of constipation and required a stool softener.  She has a bowel movement every other day.  Patient reports she does have dark stools but denies any active signs of bleeding.  Patient reports recent episodes of night sweats but denies changing the sheets or her close.  She denies fevers, chills, shortness of breath, chest pain or cough.  She has no other complaints.  Remaining 10 point ROS is below.  MEDICAL HISTORY:  Past Medical History:  Diagnosis Date   Anxiety     Arthritis    degenerative lumbar spine    Chronic neck pain    Closed fracture of base of fifth metatarsal bone 37/3428   Left   Complication of anesthesia    at one time my blood pressure dropped -lumbar - on bp med at that time   Depression    Dysphagia    esophageal stretched 9-14    History of blood transfusion 2009   post - lumbar fusion    Hyperlipidemia    Hypertension    No longer takes med.   Osteoarthritis of left shoulder region    Mild, Dr. Mardelle Matte   Osteoporosis     SURGICAL HISTORY: Past Surgical History:  Procedure Laterality Date   ABDOMINAL HYSTERECTOMY     ANTERIOR CERVICAL DECOMP/DISCECTOMY FUSION N/A 11/20/2014   Procedure: ANTERIOR CERVICAL DECOMPRESSION/DISCECTOMY FUSION 2 LEVELS;  Surgeon: Phylliss Bob, MD;  Location: [facility];  Service: Orthopedics;  Laterality: N/A;  Anterior cervical decompression fusion, cervical 3-4, cervical 4-5 with instrumentation and allograft   arm surgery Bilateral    transposed ulner nerve 10 (dr. Amedeo Plenty)   BACK SURGERY  2009   Dr Jeralyn Bennett SURGERY  01-2014   Dr Saintclair Halsted   carpal tunnell  2006   bilateral   CHOLECYSTECTOMY     KNEE SURGERY  9-11   right,  scope; left knee   NECK SURGERY  05/2008   Dr Saintclair Halsted   OOPHORECTOMY Bilateral    PARTIAL KNEE ARTHROPLASTY  01/17/2012   LEFT KNEE   PARTIAL KNEE ARTHROPLASTY  01/17/2012   Procedure: UNICOMPARTMENTAL KNEE;  Surgeon: Johnny Bridge, MD;  Location: [facility];  Service: Orthopedics;  Laterality: Left;   PARTIAL KNEE ARTHROPLASTY Right 04/21/2020   Procedure: UNICOMPARTMENTAL KNEE;  Surgeon: Marchia Bond, MD;  Location[facility];  Service: Orthopedics;  Laterality: Right;   RHINOPLASTY  1980   SHOULDER SURGERY Bilateral    x 2/Dr. Collier Salina   TONSILLECTOMY      SOCIAL HISTORY: Social History   Socioeconomic History   Marital status: Widowed    Spouse name: [name].   Number of children: 4   Years of education: Not on file   Highest education level: Not on file   Occupational History   Occupation: stay home   Tobacco Use   Smoking status: Never   Smokeless tobacco: Never  Vaping Use   Vaping Use: Never used  Substance and Sexual Activity   Alcohol use: Not Currently    Comment: rare   Drug use: No   Sexual activity: Not Currently  Other Topics Concern   Not on file  Social History Narrative   Lost husband 2021   Household: pt and son [name]   Children: 3 daughters and 1 son   Social Determinants of Health   Financial Resource Strain: Not on file  Food Insecurity: Not on file  Transportation Needs: Not on file  Physical Activity: Not on file  Stress: Not on file  Social Connections: Not on file  Intimate Partner Violence: Not on file    FAMILY HISTORY: Family History  Problem Relation Age of Onset   Prostate cancer Father    Diabetes Sister    Heart disease Sister        sister and brothers x 2   Kidney disease Sister    Kidney disease Brother    Heart disease Brother    Stomach cancer Maternal Aunt    Colon cancer Neg Hx    Breast cancer Neg Hx    Esophageal cancer Neg Hx    Pancreatic cancer Neg Hx     ALLERGIES:  is allergic to tape, alendronate sodium, cortizone-10 [hydrocortisone], oxycodone-acetaminophen, and penicillins.  MEDICATIONS:  Current Outpatient Medications  Medication Sig Dispense Refill   acetaminophen (TYLENOL) 500 MG tablet Take 1,000 mg by mouth in the morning and at bedtime.     buPROPion (WELLBUTRIN) 100 MG tablet Take 1 tablet (100 mg total) by mouth 2 (two) times daily. 180 tablet 1   clonazePAM (KLONOPIN) 0.5 MG tablet TAKE 1 TABLET(0.5 MG) BY MOUTH THREE TIMES DAILY AS NEEDED FOR ANXIETY 270 tablet 1   escitalopram (LEXAPRO) 10 MG tablet TAKE 1 TABLET(10 MG) BY MOUTH DAILY 90 tablet 1   Multiple Vitamin (MULTIVITAMIN) tablet Take 1 tablet by mouth daily. Reported on 08/25/2015     pantoprazole (PROTONIX) 40 MG tablet Take 1 tablet (40 mg total) by mouth daily before breakfast. 90 tablet 1    Propylene Glycol (SYSTANE BALANCE) 0.6 % SOLN Place 1 drop into both eyes daily.     simvastatin (ZOCOR) 40 MG tablet Take 1 tablet (40 mg total) by mouth at bedtime. 90 tablet 1   vitamin C (ASCORBIC ACID) 500 MG tablet Take 500 mg by mouth daily.     sennosides-docusate sodium (SENOKOT-S) 8.6-50 MG tablet Take 2 tablets by mouth daily. While taking pain medication (Patient not taking: Reported on 03/17/2021) 30 tablet 1   zinc gluconate 50 MG tablet Take 50 mg by mouth daily. (Patient not taking: Reported on 03/17/2021)     No current facility-administered medications for this visit.    REVIEW OF SYSTEMS:   Constitutional: ( - )  fevers, ( - )  chills , ( - ) night sweats Eyes: ( - ) blurriness of vision, ( - ) double vision, ( - ) watery eyes Ears, nose, mouth, throat, and face: ( - ) mucositis, ( - ) sore throat Respiratory: ( - ) cough, ( - ) dyspnea, ( - ) wheezes Cardiovascular: ( - ) palpitation, ( - ) chest discomfort, ( - ) lower extremity swelling Gastrointestinal:  ( - ) nausea, ( - ) heartburn, ( + ) change in bowel habits Skin: ( - ) abnormal skin rashes Lymphatics: ( - ) new lymphadenopathy, ( - ) easy bruising Neurological: ( - ) numbness, ( - ) tingling, ( - ) new weaknesses Behavioral/Psych: ( - ) mood change, ( - ) new changes  All other systems were reviewed with the patient and are negative.  PHYSICAL EXAMINATION: ECOG PERFORMANCE STATUS: 1 - Symptomatic but completely ambulatory  Vitals:   03/17/21 1153  BP: (!) 168/78  Pulse: 69  Resp: 17  Temp: (!) 97.3 F (36.3 C)  SpO2: 100%   Filed Weights   03/17/21 1153  Weight: 125 lb (56.7 kg)    GENERAL: well appearing elderly female in NAD  SKIN: skin color, texture, turgor are normal, no rashes or significant lesions EYES: conjunctiva are pink and non-injected, sclera clear OROPHARYNX: no exudate, no erythema; lips, buccal mucosa, and tongue normal  NECK: supple, non-tender LYMPH:  no palpable lymphadenopathy  in the cervical or supraclavicular lymph nodes.  LUNGS: clear to auscultation and percussion with normal breathing effort HEART: regular rate & rhythm and no murmurs and no lower extremity edema ABDOMEN: soft, non-tender, non-distended, normal bowel sounds Musculoskeletal: no cyanosis of digits and no clubbing  PSYCH: alert & oriented x 3, fluent speech NEURO: no focal motor/sensory deficits  LABORATORY DATA:  I have reviewed the data as listed CBC Latest Ref Rng & Units 02/10/2021 09/10/2020 04/09/2020  WBC 3.8 - 10.8 Thousand/uL 3.7(L) 5.3 5.9  Hemoglobin 11.7 - 15.5 g/dL 11.9 12.7 11.8(L)  Hematocrit 35.0 - 45.0 % 35.9 37.7 36.8  Platelets 140 - 400 Thousand/uL 114(L) 126.0(L) 154    CMP Latest Ref Rng & Units 02/10/2021 09/10/2020 04/09/2020  Glucose 65 - 99 mg/dL 81 89 109(H)  BUN 7 - 25 mg/dL 22 22 22   Creatinine 0.60 - 0.95 mg/dL 1.06(H) 1.03 0.85  Sodium 135 - 146 mmol/L 142 143 142  Potassium 3.5 - 5.3 mmol/L 4.1 3.5 3.7  Chloride 98 - 110 mmol/L 106 105 104  CO2 20 - 32 mmol/L 25 29 26   Calcium 8.6 - 10.4 mg/dL 9.9 10.3 9.8  Total Protein 6.1 - 8.1 g/dL 6.7 7.0 -  Total Bilirubin 0.2 - 1.2 mg/dL 0.7 0.8 -  Alkaline Phos 39 - 117 U/L - 50 -  AST 10 - 35 U/L 51(H) 27 -  ALT 6 - 29 U/L 52(H) 21 -    ASSESSMENT & PLAN MAKENZYE TROUTMAN is a 81 y.o. female who presents for initial evaluation for thrombocytopenia and leukopenia.   Per review of prior labs, leukopenia is very mild and intermittent. On the other hand, patient has persistent thrombocytopenia as far back since 2019.   I reviewed potential etiologies for thrombocytopenia including liver disease, splenomegaly, infectious processes, nutritional anemias, immune mediated and bone marrow disorders. Patient is not taking any medications or recent infections. Patient will proceed with labs today to check CBC, CMP, Vitamin B12, MMA, folate, LDH, Hepatitis B and C serologies, HIV serology and  save smear.    #Thrombocytopenia, etiology unknown: --Labs today to check CBC w/diff, CMP, B12 level, folate level, Hep B and C serologies, HIV serology. --Evaluate for platelet abnormality with save smear. --Evaluate for liver disease and/or splenomegaly with abdominal US --RTC based on above workup.   #Leukopenia, mild: --Oscillates back and forth to normal.  --Will check underlying causes with above serologic workup.    Orders Placed This Encounter  Procedures   CBC with Differential ([facility] Only)    Standing Status:   Future    Standing Expiration Date:   03/17/2022   CMP ([facility] only)    Standing Status:   Future    Standing Expiration Date:   03/17/2022   Vitamin B12    Standing Status:   Future    Standing Expiration Date:   03/17/2022   Methylmalonic acid, serum    Standing Status:   Future    Standing Expiration Date:   03/17/2022   Folate, Serum    Standing Status:   Future    Standing Expiration Date:   03/17/2022   Homocysteine, serum    Standing Status:   Future    Standing Expiration Date:   03/17/2022   Hepatitis B core antibody, total    Standing Status:   Future    Standing Expiration Date:   03/17/2022   Hepatitis B surface antibody    Standing Status:   Future    Standing Expiration Date:   03/17/2022   Hepatitis B surface antigen    Standing Status:   Future    Standing Expiration Date:   03/17/2022   Hepatitis C antibody    Standing Status:   Future    Standing Expiration Date:   03/17/2022   HIV antibody (with reflex)    Standing Status:   Future    Standing Expiration Date:   03/17/2022    All questions were answered. The patient knows to call the clinic with any problems, questions or concerns.  I have spent a total of 60 minutes minutes of face-to-face and non-face-to-face time, preparing to see the patient, obtaining and/or reviewing separately obtained history, performing a medically appropriate examination, counseling and educating the patient, ordering tests,  documenting clinical information in the electronic health record,  and care coordination.   Dede Query, PA-C Department of Hematology/Oncology [place] at [facility] Phone: [phone]

## 2021-03-18 LAB — HOMOCYSTEINE: Homocysteine: 10.7 umol/L (ref 0.0–19.2)

## 2021-03-21 LAB — METHYLMALONIC ACID, SERUM: Methylmalonic Acid, Quantitative: 209 nmol/L (ref 0–378)

## 2021-03-22 ENCOUNTER — Telehealth: Payer: Self-pay | Admitting: Physician Assistant

## 2021-03-22 NOTE — Telephone Encounter (Signed)
I called Ms. Ryner to review the lab results from 03/17/2021.  Findings indicate persistent thrombocytopenia with a platelet count of 94K, which has decreased over the last 6 months.  We reviewed the remaining labs that showed no evidence of nutritional deficiencies, viral infections including hepatitis B, type C and HIV.  Patient is scheduled for an abdominal ultrasound tomorrow along with a CT chest.  We will await those results to see if there is any evidence of liver disease and or splenomegaly.  If the abdominal ultrasound and CT scan does not indicate an underlying cause for her cytopenia, the recommendation would be to proceed with a bone marrow biopsy to rule out bone marrow disorders.  Patient expressed understanding and satisfaction with the plan provided.  I will follow-up with the patient later this week to discuss next steps.

## 2021-03-23 ENCOUNTER — Ambulatory Visit (HOSPITAL_BASED_OUTPATIENT_CLINIC_OR_DEPARTMENT_OTHER)
Admission: RE | Admit: 2021-03-23 | Discharge: 2021-03-23 | Disposition: A | Discharge status: Home / Self Care | Payer: PPO | Source: Ambulatory Visit | Attending: Internal Medicine | Admitting: Internal Medicine

## 2021-03-23 ENCOUNTER — Other Ambulatory Visit: Payer: Self-pay

## 2021-03-23 DIAGNOSIS — R634 Abnormal weight loss: Secondary | ICD-10-CM | POA: Insufficient documentation

## 2021-03-23 DIAGNOSIS — I700 Atherosclerosis of aorta: Secondary | ICD-10-CM | POA: Diagnosis not present

## 2021-03-23 DIAGNOSIS — R918 Other nonspecific abnormal finding of lung field: Secondary | ICD-10-CM | POA: Insufficient documentation

## 2021-03-23 DIAGNOSIS — R945 Abnormal results of liver function studies: Secondary | ICD-10-CM | POA: Diagnosis not present

## 2021-03-23 DIAGNOSIS — R7989 Other specified abnormal findings of blood chemistry: Secondary | ICD-10-CM | POA: Diagnosis not present

## 2021-03-23 DIAGNOSIS — K760 Fatty (change of) liver, not elsewhere classified: Secondary | ICD-10-CM | POA: Diagnosis not present

## 2021-03-23 DIAGNOSIS — R911 Solitary pulmonary nodule: Secondary | ICD-10-CM | POA: Diagnosis not present

## 2021-03-24 ENCOUNTER — Telehealth: Payer: Self-pay | Admitting: Physician Assistant

## 2021-03-24 DIAGNOSIS — D696 Thrombocytopenia, unspecified: Secondary | ICD-10-CM

## 2021-03-24 NOTE — Telephone Encounter (Addendum)
I called Rebecca [name] and reviewed abdominal US results that show no evidence of liver disease or splenomegaly. Discussed with Rebecca [name] who recommend a bone marrow biopsy to further evaluate cause of thrombocytopenia. Patient is scheduled for a bone marrow biopsy on 04/09/2021. She requested if she could transfer care to Rebecca [name] at [facility] as he was the treating physician for her husband. We will request a follow up with Rebecca [name] after the bone marrow biopsy results.   Patient expressed understanding and satisfaction with the plan provided.

## 2021-03-26 ENCOUNTER — Encounter: Payer: Self-pay | Admitting: *Deleted

## 2021-03-26 NOTE — Progress Notes (Signed)
Patient is the wife of one of our former long term patients. Received a message from Dede Query stating that she was working patient up for thrombocytopenia and after her scheduled bone marrow, patient would like to be followed up by Dr Marin Olp.   Scheduled patient for new patient appointment one week after bone marrow. Called and spoke to patient. She is aware of appointment date and time.   Oncology Nurse Navigator Documentation  Oncology Nurse Navigator Flowsheets 03/26/2021  Navigator Location [facility]  Referral Date to RadOnc/MedOnc 03/26/2021  Navigator Encounter Type Introductory Phone Call  Patient Visit Type MedOnc  Treatment Phase Other  Barriers/Navigation Needs Coordination of Care;Education  Education Other  Interventions Coordination of Care;Education;Psycho-Social Support  Acuity Level 2-Minimal Needs (1-2 Barriers Identified)  Coordination of Care Appts  Education Method Verbal  Support Groups/Services Friends and Family  Time Spent with Patient 30

## 2021-04-06 ENCOUNTER — Other Ambulatory Visit: Payer: Self-pay

## 2021-04-06 ENCOUNTER — Encounter (HOSPITAL_COMMUNITY): Payer: Self-pay

## 2021-04-06 ENCOUNTER — Ambulatory Visit (HOSPITAL_COMMUNITY)
Admission: EM | Admit: 2021-04-06 | Discharge: 2021-04-06 | Disposition: A | Discharge status: Home / Self Care | Payer: PPO | Attending: Emergency Medicine | Admitting: Emergency Medicine

## 2021-04-06 DIAGNOSIS — K1379 Other lesions of oral mucosa: Secondary | ICD-10-CM

## 2021-04-06 NOTE — ED Provider Notes (Signed)
HPI  SUBJECTIVE:  Rebecca [name] is a 81 y.o. female who presents with 2 to 3 days of constant mouth pain, worse along the hard palate.  She states it feels like she has pins driven into her gums, and states that she is able to feel the head of the pin with her tongue.  She denies recent pin insertion to her mouth, although she states that she would hold straight pins in her mouth while sewing. She has not done this in 2 years.  She states that her dentures fit well, and do not require denture adhesive.  She has had these dentures for some time.  No white patches or coating of the tongue, buccal mucosa, oral itching, burning, fevers, gingival swelling, recent antibiotics.  She has tried taking out her dentures and rinsing her mouth out with salt water improvement in her symptoms.  Symptoms are worse when she puts her dentures in and with touching the sore areas with her tongue.  She states that she takes her dentures out daily and cleans them.  No recent change in medications.  She has a past medical history of hypertension, hypercholesterolemia, thrombocytopenia.  No history of diabetes.    Past Medical History:  Diagnosis Date   Anxiety    Arthritis    degenerative lumbar spine    Chronic neck pain    Closed fracture of base of fifth metatarsal bone 32/2025   Left   Complication of anesthesia    at one time my blood pressure dropped -lumbar - on bp med at that time   Depression    Dysphagia    esophageal stretched 9-14    History of blood transfusion 2009   post - lumbar fusion    Hyperlipidemia    Hypertension    No longer takes med.   Osteoarthritis of left shoulder region    Mild, Dr. Mardelle Matte   Osteoporosis     Past Surgical History:  Procedure Laterality Date   ABDOMINAL HYSTERECTOMY     ANTERIOR CERVICAL DECOMP/DISCECTOMY FUSION N/A 11/20/2014   Procedure: ANTERIOR CERVICAL DECOMPRESSION/DISCECTOMY FUSION 2 LEVELS;  Surgeon: Phylliss Bob, MD;  Location: [facility];  Service:  Orthopedics;  Laterality: N/A;  Anterior cervical decompression fusion, cervical 3-4, cervical 4-5 with instrumentation and allograft   arm surgery Bilateral    transposed ulner nerve 10 (dr. Amedeo Plenty)   BACK SURGERY  2009   Dr Jeralyn Bennett SURGERY  01-2014   Dr Saintclair Halsted   carpal tunnell  2006   bilateral   CHOLECYSTECTOMY     KNEE SURGERY  9-11   right,  scope; left knee   NECK SURGERY  05/2008   Dr Saintclair Halsted   OOPHORECTOMY Bilateral    PARTIAL KNEE ARTHROPLASTY  01/17/2012   LEFT KNEE   PARTIAL KNEE ARTHROPLASTY  01/17/2012   Procedure: UNICOMPARTMENTAL KNEE;  Surgeon: Johnny Bridge, MD;  Location: [facility];  Service: Orthopedics;  Laterality: Left;   PARTIAL KNEE ARTHROPLASTY Right 04/21/2020   Procedure: UNICOMPARTMENTAL KNEE;  Surgeon: Marchia Bond, MD;  Location: [facility];  Service: Orthopedics;  Laterality: Right;   RHINOPLASTY  1980   SHOULDER SURGERY Bilateral    x 2/Dr. Collier Salina   TONSILLECTOMY      Family History  Problem Relation Age of Onset   Prostate cancer Father    Diabetes Sister    Heart disease Sister        sister and brothers x 2   Kidney disease Sister  Kidney disease Brother    Heart disease Brother    Stomach cancer Maternal Aunt    Colon cancer Neg Hx    Breast cancer Neg Hx    Esophageal cancer Neg Hx    Pancreatic cancer Neg Hx     Social History   Tobacco Use   Smoking status: Never   Smokeless tobacco: Never  Vaping Use   Vaping Use: Never used  Substance Use Topics   Alcohol use: Not Currently    Comment: rare   Drug use: No    No current facility-administered medications for this encounter.  Current Outpatient Medications:    acetaminophen (TYLENOL) 500 MG tablet, Take 1,000 mg by mouth in the morning and at bedtime., Disp: , Rfl:    buPROPion (WELLBUTRIN) 100 MG tablet, Take 1 tablet (100 mg total) by mouth 2 (two) times daily., Disp: 180 tablet, Rfl: 1   clonazePAM (KLONOPIN) 0.5 MG tablet, TAKE 1 TABLET(0.5 MG) BY MOUTH THREE TIMES  DAILY AS NEEDED FOR ANXIETY, Disp: 270 tablet, Rfl: 1   escitalopram (LEXAPRO) 10 MG tablet, TAKE 1 TABLET(10 MG) BY MOUTH DAILY, Disp: 90 tablet, Rfl: 1   Multiple Vitamin (MULTIVITAMIN) tablet, Take 1 tablet by mouth daily. Reported on 08/25/2015, Disp: , Rfl:    pantoprazole (PROTONIX) 40 MG tablet, Take 1 tablet (40 mg total) by mouth daily before breakfast., Disp: 90 tablet, Rfl: 1   Propylene Glycol (SYSTANE BALANCE) 0.6 % SOLN, Place 1 drop into both eyes daily., Disp: , Rfl:    simvastatin (ZOCOR) 40 MG tablet, Take 1 tablet (40 mg total) by mouth at bedtime., Disp: 90 tablet, Rfl: 1   vitamin C (ASCORBIC ACID) 500 MG tablet, Take 500 mg by mouth daily., Disp: , Rfl:   Allergies  Allergen Reactions   Tape Other (See Comments)    White adhesive tape -blisters   Alendronate Sodium Other (See Comments)    REACTION: jaw pain   Cortizone-10 [Hydrocortisone] Other (See Comments)    Pt received cortizone shot, and became very flushed, and hot, blisters, raw areas around lips & ears    Oxycodone-Acetaminophen Hives   Penicillins Swelling and Rash     ROS  As noted in HPI.   Physical Exam  BP 134/90 (BP Location: Left Arm)    Pulse 71    Temp 98 F (36.7 C) (Oral)    Resp 18    SpO2 98%   Constitutional: Well developed, well nourished, no acute distress Eyes:  EOMI, conjunctiva normal bilaterally HENT: Normocephalic, atraumatic,mucus membranes moist.  Positive tender erythematous, irritated areas consistent with denture pressure points on the roof of her mouth, several areas in her gums.  No visual or palpable foreign bodies felt.  No white coating on her tongue, buccal mucosa. Neck: No cervical lymphadenopathy Respiratory: Normal inspiratory effort Cardiovascular: Normal rate GI: nondistended skin: No rash, skin intact Musculoskeletal: no deformities Neurologic: Alert & oriented x 3, no focal neuro deficits Psychiatric: Speech and behavior appropriate   ED  Course   Medications - No data to display  No orders of the defined types were placed in this encounter.   No results found for this or any previous visit (from the past 24 hour(s)). No results found.  ED Clinical Impression  1. Erosion of oral mucosa      ED Assessment/Plan  This appears to be erosions from pressure points on her dentures, which fit very tightly.  There does not appear to be any evidence of a  oral candidiasis.  No foreign bodies seen.  Patient absolutely denies inserting foreign bodies into her mouth over the past 2 years.  will try salt water rinses, not wearing her dentures for several days, and if her symptoms do not resolve, she will need to follow-up with her dentist for [facility].  We do not have that capability here.  Considered triamcinolone paste, however, patient reports urticaria and tongue swelling with cortisone shot in the past.  Discussed MDM, treatment plan, and plan for follow-up with patient and son.  They agree with plan.  No orders of the defined types were placed in this encounter.     *This clinic note was created using Dragon dictation software. Therefore, there may be occasional mistakes despite careful proofreading.  ?    Melynda Ripple, MD 04/07/21 [phone]

## 2021-04-06 NOTE — Discharge Instructions (Addendum)
I have decided to not send you home with a triamcinolone paste because you had an allergic reaction to steroids in the past and I do not want to make your situation any worse.  Do salt water rinses as often as you want, at least 3 times a day.  Do not wear your dentures for several days.  If you are not getting any better in 3 to 4 days, then follow-up with your dentist for [facility] imaging, which is dental imaging.

## 2021-04-06 NOTE — ED Triage Notes (Signed)
Pt c/o upper and lower mouth pain since Saturday. States feels like she has straight pins stuck in her gums. States pain to eat or drink. No pins are noted in her mouth at this time.

## 2021-04-08 ENCOUNTER — Other Ambulatory Visit: Payer: Self-pay | Admitting: Radiology

## 2021-04-09 ENCOUNTER — Ambulatory Visit (HOSPITAL_COMMUNITY)
Admission: RE | Admit: 2021-04-09 | Discharge: 2021-04-09 | Disposition: A | Discharge status: Home / Self Care | Payer: PPO | Source: Ambulatory Visit | Attending: Physician Assistant | Admitting: Physician Assistant

## 2021-04-09 ENCOUNTER — Ambulatory Visit (HOSPITAL_COMMUNITY)
Admission: RE | Admit: 2021-04-09 | Discharge: 2021-04-09 | Disposition: A | Discharge status: Home / Self Care | Payer: PPO | Source: Ambulatory Visit

## 2021-04-09 ENCOUNTER — Encounter (HOSPITAL_COMMUNITY): Payer: Self-pay

## 2021-04-09 ENCOUNTER — Other Ambulatory Visit: Payer: Self-pay

## 2021-04-09 DIAGNOSIS — R1310 Dysphagia, unspecified: Secondary | ICD-10-CM | POA: Insufficient documentation

## 2021-04-09 DIAGNOSIS — I10 Essential (primary) hypertension: Secondary | ICD-10-CM | POA: Insufficient documentation

## 2021-04-09 DIAGNOSIS — F32A Depression, unspecified: Secondary | ICD-10-CM | POA: Insufficient documentation

## 2021-04-09 DIAGNOSIS — M159 Polyosteoarthritis, unspecified: Secondary | ICD-10-CM | POA: Diagnosis not present

## 2021-04-09 DIAGNOSIS — F419 Anxiety disorder, unspecified: Secondary | ICD-10-CM | POA: Insufficient documentation

## 2021-04-09 DIAGNOSIS — D696 Thrombocytopenia, unspecified: Secondary | ICD-10-CM | POA: Insufficient documentation

## 2021-04-09 DIAGNOSIS — D72819 Decreased white blood cell count, unspecified: Secondary | ICD-10-CM | POA: Diagnosis not present

## 2021-04-09 DIAGNOSIS — E785 Hyperlipidemia, unspecified: Secondary | ICD-10-CM | POA: Diagnosis not present

## 2021-04-09 LAB — CBC WITH DIFFERENTIAL/PLATELET
Abs Immature Granulocytes: 0.01 10*3/uL (ref 0.00–0.07)
Basophils Absolute: 0 10*3/uL (ref 0.0–0.1)
Basophils Relative: 1 %
Eosinophils Absolute: 0.1 10*3/uL (ref 0.0–0.5)
Eosinophils Relative: 3 %
HCT: 37.7 % (ref 36.0–46.0)
Hemoglobin: 12.5 g/dL (ref 12.0–15.0)
Immature Granulocytes: 0 %
Lymphocytes Relative: 37 %
Lymphs Abs: 1.5 10*3/uL (ref 0.7–4.0)
MCH: 32.3 pg (ref 26.0–34.0)
MCHC: 33.2 g/dL (ref 30.0–36.0)
MCV: 97.4 fL (ref 80.0–100.0)
Monocytes Absolute: 0.3 10*3/uL (ref 0.1–1.0)
Monocytes Relative: 9 %
Neutro Abs: 2 10*3/uL (ref 1.7–7.7)
Neutrophils Relative %: 50 %
Platelets: 104 10*3/uL — ABNORMAL LOW (ref 150–400)
RBC: 3.87 MIL/uL (ref 3.87–5.11)
RDW: 12.8 % (ref 11.5–15.5)
WBC: 4 10*3/uL (ref 4.0–10.5)
nRBC: 0 % (ref 0.0–0.2)

## 2021-04-09 MED ORDER — FENTANYL CITRATE (PF) 100 MCG/2ML IJ SOLN
INTRAMUSCULAR | Status: AC | PRN
  Administered 2021-04-09: 25 ug via INTRAVENOUS

## 2021-04-09 MED ORDER — FENTANYL CITRATE (PF) 100 MCG/2ML IJ SOLN
INTRAMUSCULAR | Status: AC
  Filled 2021-04-09: qty 2

## 2021-04-09 MED ORDER — MIDAZOLAM HCL 2 MG/2ML IJ SOLN
INTRAMUSCULAR | Status: AC | PRN
  Administered 2021-04-09: .5 mg via INTRAVENOUS

## 2021-04-09 MED ORDER — NALOXONE HCL 0.4 MG/ML IJ SOLN
INTRAMUSCULAR | Status: AC
  Filled 2021-04-09: qty 1

## 2021-04-09 MED ORDER — MIDAZOLAM HCL 2 MG/2ML IJ SOLN
INTRAMUSCULAR | Status: AC
  Filled 2021-04-09: qty 2

## 2021-04-09 MED ORDER — LIDOCAINE HCL (PF) 1 % IJ SOLN
INTRAMUSCULAR | Status: AC | PRN
  Administered 2021-04-09: 10 mL via INTRADERMAL

## 2021-04-09 MED ORDER — FLUMAZENIL 0.5 MG/5ML IV SOLN
INTRAVENOUS | Status: AC
  Filled 2021-04-09: qty 5

## 2021-04-09 MED ORDER — SODIUM CHLORIDE 0.9 % IV SOLN: INTRAVENOUS | Status: DC

## 2021-04-09 NOTE — Discharge Instructions (Addendum)
For questions /concerns may call Interventional Radiology at [phone]  You may remove your dressing and shower tomorrow afternoon.  Do not submerge in a tub or pool until site is healed.     Bone Marrow Aspiration and Bone Marrow Biopsy, Adult, Care After This sheet gives you information about how to care for yourself after your procedure. Your health care provider may also give you more specific instructions. If you have problems or questions, contact your health careprovider. What can I expect after the procedure? After the procedure, it is common to have: Mild pain and tenderness. Swelling. Bruising. Follow these instructions at home: Puncture site care Follow instructions from your health care provider about how to take care of the puncture site. Make sure you: Wash your hands with soap and water before and after you change your bandage (dressing). If soap and water are not available, use hand sanitizer. Change your dressing as told by your health care provider. Check your puncture site every day for signs of infection. Check for: More redness, swelling, or pain. Fluid or blood. Warmth. Pus or a bad smell.  Activity Return to your normal activities as told by your health care provider. Ask your health care provider what activities are safe for you. Do not lift anything that is heavier than 10 lb (4.5 kg), or the limit that you are told, until your health care provider says that it is safe. Do not drive for 24 hours if you were given a sedative during your procedure. General instructions Take over-the-counter and prescription medicines only as told by your health care provider. Do not take baths, swim, or use a hot tub until your health care provider approves. Ask your health care provider if you may take showers. You may only be allowed to take sponge baths. If directed, put ice on the affected area. To do this: Put ice in a plastic bag. Place a towel between your skin and the  bag. Leave the ice on for 20 minutes, 2-3 times a day. Keep all follow-up visits as told by your health care provider. This is important.  Contact a health care provider if: Your pain is not controlled with medicine. You have a fever. You have more redness, swelling, or pain around the puncture site. You have fluid or blood coming from the puncture site. Your puncture site feels warm to the touch. You have pus or a bad smell coming from the puncture site. Summary After the procedure, it is common to have mild pain, tenderness, swelling, and bruising. Follow instructions from your health care provider about how to take care of the puncture site and what activities are safe for you. Take over-the-counter and prescription medicines only as told by your health care provider. Contact a health care provider if you have any signs of infection, such as fluid or blood coming from the puncture site. This information is not intended to replace advice given to you by your health care provider. Make sure you discuss any questions you have with your healthcare provider.    Moderate Conscious Sedation, Adult, Care After This sheet gives you information about how to care for yourself after your procedure. Your health care provider may also give you more specific instructions. If you have problems or questions, contact your health careprovider. What can I expect after the procedure? After the procedure, it is common to have: Sleepiness for several hours. Impaired judgment for several hours. Difficulty with balance. Vomiting if you eat too soon. Follow these  instructions at home: For the time period you were told by your health care provider: Rest. Do not participate in activities where you could fall or become injured. Do not drive or use machinery. Do not drink alcohol. Do not take sleeping pills or medicines that cause drowsiness. Do not make important decisions or sign legal documents. Do not  take care of children on your own. Eating and drinking  Follow the diet recommended by your health care provider. Drink enough fluid to keep your urine pale yellow. If you vomit: Drink water, juice, or soup when you can drink without vomiting. Make sure you have little or no nausea before eating solid foods.  General instructions Take over-the-counter and prescription medicines only as told by your health care provider. Have a responsible adult stay with you for the time you are told. It is important to have someone help care for you until you are awake and alert. Do not smoke. Keep all follow-up visits as told by your health care provider. This is important. Contact a health care provider if: You are still sleepy or having trouble with balance after 24 hours. You feel light-headed. You keep feeling nauseous or you keep vomiting. You develop a rash. You have a fever. You have redness or swelling around the IV site. Get help right away if: You have trouble breathing. You have new-onset confusion at home. Summary After the procedure, it is common to feel sleepy, have impaired judgment, or feel nauseous if you eat too soon. Rest after you get home. Know the things you should not do after the procedure. Follow the diet recommended by your health care provider and drink enough fluid to keep your urine pale yellow. Get help right away if you have trouble breathing or new-onset confusion at home. This information is not intended to replace advice given to you by your health care provider. Make sure you discuss any questions you have with your healthcare provider. Document Revised: 06/28/2019 Document Reviewed: 01/24/2019 Elsevier Patient Education  2022 [employer].

## 2021-04-09 NOTE — Procedures (Signed)
Interventional Radiology Procedure Note  Procedure: CT BM ASP AND CORE     Complications: None  Estimated Blood Loss:  0  Findings: 11 G CORE AND ASP    M. TREVOR Jonea Bukowski, MD    

## 2021-04-09 NOTE — Consult Note (Addendum)
Chief Complaint: Patient was seen in consultation today for  CT guided bone marrow biopsy  Referring Physician(s): Lincoln Brigham, PA-C/Ennever,P  Supervising Physician: Daryll Brod  Patient Status: [facility] - Out-pt  History of Present Illness: Rebecca [name] is an 81 y.o. female with PMH sig for anxiety/depression, dysphagia, HLD,HTN,OA who presents now with persistent thrombocytopenia/transient leukopenia of uncertain etiology. She is scheduled today for CT guided bone marrow biopsy for further evaluation.    Past Medical History:  Diagnosis Date   Anxiety    Arthritis    degenerative lumbar spine    Chronic neck pain    Closed fracture of base of fifth metatarsal bone 88/2800   Left   Complication of anesthesia    at one time my blood pressure dropped -lumbar - on bp med at that time   Depression    Dysphagia    esophageal stretched 9-14    History of blood transfusion 2009   post - lumbar fusion    Hyperlipidemia    Hypertension    No longer takes med.   Osteoarthritis of left shoulder region    Mild, Dr. Mardelle Matte   Osteoporosis     Past Surgical History:  Procedure Laterality Date   ABDOMINAL HYSTERECTOMY     ANTERIOR CERVICAL DECOMP/DISCECTOMY FUSION N/A 11/20/2014   Procedure: ANTERIOR CERVICAL DECOMPRESSION/DISCECTOMY FUSION 2 LEVELS;  Surgeon: Phylliss Bob, MD;  Location: [place];  Service: Orthopedics;  Laterality: N/A;  Anterior cervical decompression fusion, cervical 3-4, cervical 4-5 with instrumentation and allograft   arm surgery Bilateral    transposed ulner nerve 10 (dr. Amedeo Plenty)   BACK SURGERY  2009   Dr Jeralyn Bennett SURGERY  01-2014   Dr Saintclair Halsted   carpal tunnell  2006   bilateral   CHOLECYSTECTOMY     KNEE SURGERY  9-11   right,  scope; left knee   NECK SURGERY  05/2008   Dr Saintclair Halsted   OOPHORECTOMY Bilateral    PARTIAL KNEE ARTHROPLASTY  01/17/2012   LEFT KNEE   PARTIAL KNEE ARTHROPLASTY  01/17/2012   Procedure: UNICOMPARTMENTAL KNEE;  Surgeon:  Johnny Bridge, MD;  Location: [place];  Service: Orthopedics;  Laterality: Left;   PARTIAL KNEE ARTHROPLASTY Right 04/21/2020   Procedure: UNICOMPARTMENTAL KNEE;  Surgeon: Marchia Bond, MD;  Location: [facility];  Service: Orthopedics;  Laterality: Right;   RHINOPLASTY  1980   SHOULDER SURGERY Bilateral    x 2/Dr. Collier Salina   TONSILLECTOMY      Allergies: Tape, Alendronate sodium, Cortizone-10 [hydrocortisone], Oxycodone-acetaminophen, and Penicillins  Medications: Prior to Admission medications   Medication Sig Start Date End Date Taking? Authorizing Provider  acetaminophen (TYLENOL) 500 MG tablet Take 1,000 mg by mouth in the morning and at bedtime.   Yes [provider]  buPROPion (WELLBUTRIN) 100 MG tablet Take 1 tablet (100 mg total) by mouth 2 (two) times daily. 02/10/21  Yes Paz, Alda Berthold, MD  clonazePAM (KLONOPIN) 0.5 MG tablet TAKE 1 TABLET(0.5 MG) BY MOUTH THREE TIMES DAILY AS NEEDED FOR ANXIETY 11/04/20  Yes Colon Branch, MD  escitalopram (LEXAPRO) 10 MG tablet TAKE 1 TABLET(10 MG) BY MOUTH DAILY 02/10/21  Yes Colon Branch, MD  Multiple Vitamin (MULTIVITAMIN) tablet Take 1 tablet by mouth daily. Reported on 08/25/2015   Yes [provider]  pantoprazole (PROTONIX) 40 MG tablet Take 1 tablet (40 mg total) by mouth daily before breakfast. 01/11/21  Yes Paz, Alda Berthold, MD  Propylene Glycol (SYSTANE BALANCE) 0.6 %  SOLN Place 1 drop into both eyes daily.   Yes [provider]  simvastatin (ZOCOR) 40 MG tablet Take 1 tablet (40 mg total) by mouth at bedtime. 10/21/20  Yes Paz, Alda Berthold, MD  vitamin C (ASCORBIC ACID) 500 MG tablet Take 500 mg by mouth daily.   Yes [provider]     Family History  Problem Relation Age of Onset   Prostate cancer Father    Diabetes Sister    Heart disease Sister        sister and brothers x 2   Kidney disease Sister    Kidney disease Brother    Heart disease Brother    Stomach cancer Maternal Aunt    Colon cancer Neg Hx     Breast cancer Neg Hx    Esophageal cancer Neg Hx    Pancreatic cancer Neg Hx     Social History   Socioeconomic History   Marital status: Widowed    Spouse name: [name].   Number of children: 4   Years of education: Not on file   Highest education level: Not on file  Occupational History   Occupation: stay home   Tobacco Use   Smoking status: Never   Smokeless tobacco: Never  Vaping Use   Vaping Use: Never used  Substance and Sexual Activity   Alcohol use: Not Currently    Comment: rare   Drug use: No   Sexual activity: Not Currently  Other Topics Concern   Not on file  Social History Narrative   Lost husband 2021   Household: pt and son [name]   Children: 3 daughters and 1 son   Social Determinants of Health   Financial Resource Strain: Not on file  Food Insecurity: Not on file  Transportation Needs: Not on file  Physical Activity: Not on file  Stress: Not on file  Social Connections: Not on file      Review of Systems denies fever,HA,CP, dyspnea, cough, abd pain, N/V or bleeding; she does have sacral pain  Vital Signs: BP (!) 178/88    Pulse 71    Temp 97.6 F (36.4 C) (Oral)    Resp 16    Ht 5' (1.524 m)    Wt 123 lb (55.8 kg)    SpO2 99%    BMI 24.02 kg/m   Physical Exam awake/alert; chest- CTA bilat; heart- RRR; abd- soft,+BS,NT; no LE edema  Imaging: CT Chest Wo Contrast  Result Date: 03/23/2021 CLINICAL DATA:  Weight loss R63.4 (ICD-10-CM) Abnormal xray - lung nodule, < 1 cm, low risk; 9 mm nodular density in the left lower lobe Abnormal findings on diagnostic imaging of lung R91.8 (ICD-10-CM) Lung nodule R91.1 (ICD-10-CM) EXAM: CT CHEST WITHOUT CONTRAST TECHNIQUE: Multidetector CT imaging of the chest was performed following the standard protocol without IV contrast. COMPARISON:  Chest radiograph February 10, 2021. FINDINGS: Cardiovascular: Calcified atherosclerosis the coronary arteries and aorta. Normal heart size. No pericardial effusion.  Mediastinum/Nodes: No enlarged mediastinal or axillary lymph nodes. Thyroid gland, trachea, and esophagus demonstrate no significant findings. Lungs/Pleura: Scattered calcified granulomas, most conspicuous in the right upper lobe. Approximately 3 mm pulmonary nodule in the left lower lobe (series 4, image 109). Punctate nodule within the left upper lobe (series 4, image 44). Punctate nodule in the right upper lobe (series 4, image 18). Density seen in the left lower lobe on recent chest radiograph probably correlates with linear atelectasis/scarring in the left lower lobe, which appears mildly nodular peripherally. No  consolidation. No pleural effusions or pneumothorax. Upper Abdomen: Calcifications in the visualized liver and spleen, likely a sequela prior granulomatous infection. Cholecystectomy. Musculoskeletal: Degenerative changes in the visualized spine. Partially imaged lumbar fusion. IMPRESSION: 1. Density seen in the left lower lobe on recent chest radiograph probably correlates with subsegmental atelectasis/scarring in the left lower lobe. 2. There are a few small pulmonary nodules, detailed above and measuring up to 3 mm. No follow-up needed if patient is low-risk (and has no known or suspected primary neoplasm). Non-contrast chest CT can be considered in 12 months if patient is high-risk. This recommendation follows the consensus statement: Guidelines for Management of Incidental Pulmonary Nodules Detected on CT Images: From the Fleischner Society 2017; Radiology 2017; 284:228-243. 3. Sequela of probable prior granulomatous infection. 4.  Coronary artery and aortic atherosclerosis (ICD10-I70.0). Electronically Signed   By: Margaretha Sheffield M.D.   On: 03/23/2021 10:29   US Abdomen Complete  Result Date: 03/23/2021 CLINICAL DATA:  Abnormal liver function tests EXAM: ABDOMEN ULTRASOUND COMPLETE COMPARISON:  None. FINDINGS: Gallbladder: Gallbladder is not seen consistent with cholecystectomy. Common  bile duct: Diameter: 1.3 mm Liver: There is increased echogenicity in the liver. There is 4 mm hyperechoic focus in the right lobe, possibly hemangioma. Portal vein is patent on color Doppler imaging with normal direction of blood flow towards the liver. IVC: No abnormality visualized. Pancreas: Visualized portion unremarkable. Spleen: Spleen is not enlarged. There are few small hyperechoic foci, possibly granulomas. Right Kidney: Length: 9.7 cm. Echogenicity within normal limits. No mass or hydronephrosis visualized. Left Kidney: Length: 9.1 cm. Echogenicity within normal limits. No mass or hydronephrosis visualized. Abdominal aorta: There is ectasia of proximal abdominal aorta measuring 2.8 cm. Atherosclerotic plaques are seen in the abdominal aorta. There is no demonstrable infrarenal aortic aneurysm. Other findings: None. IMPRESSION: Status post cholecystectomy. There is no dilation of bile ducts. Fatty liver. Possible 4 mm hemangioma is seen in the right lobe of liver. Atherosclerotic plaques are seen in the abdominal aorta. There is ectasia of proximal abdominal aorta measuring 2.8 cm in maximum diameter. Abdominal sonogram is otherwise unremarkable. Electronically Signed   By: Elmer Picker M.D.   On: 03/23/2021 14:54    Labs:  CBC: Recent Labs    09/10/20 1101 02/10/21 1044 03/17/21 1246 04/09/21 0730  WBC 5.3 3.7* 4.5 4.0  HGB 12.7 11.9 12.1 12.5  HCT 37.7 35.9 36.7 37.7  PLT 126.0* 114* 94* 104*    COAGS: No results for input(s): INR, APTT in the last 8760 hours.  BMP: Recent Labs    04/09/20 1327 09/10/20 1101 02/10/21 1044 03/17/21 1246  NA 142 143 142 141  K 3.7 3.5 4.1 3.9  CL 104 105 106 107  CO2 _0 GLUCOSE 109* 89 81 89  BUN _1 28*  CALCIUM 9.8 10.3 9.9 9.6  CREATININE 0.85 1.03 1.06* 1.14*  GFRNONAA >60  --   --  49*    LIVER FUNCTION TESTS: Recent Labs    09/10/20 1101 02/10/21 1044 03/17/21 1246  BILITOT 0.8 0.7 0.7  AST 27 51* 40   ALT 21 52* 50*  ALKPHOS 50  --  61  PROT 7.0 6.7 7.1  ALBUMIN 4.4  --  4.3    TUMOR MARKERS: No results for input(s): AFPTM, CEA, CA199, CHROMGRNA in the last 8760 hours.  Assessment and Plan: 81 y.o. female with PMH sig for anxiety/depression, dysphagia, HLD,HTN,OA who presents now with persistent thrombocytopenia/transient leukopenia of uncertain etiology. She  is scheduled today for CT guided bone marrow biopsy for further evaluation.Risks and benefits of procedure was discussed with the patient including, but not limited to bleeding, infection, damage to adjacent structures or low yield requiring additional tests.  All of the questions were answered and there is agreement to proceed.  Consent signed and in chart.    Thank you for this interesting consult.  I greatly enjoyed meeting Karole M Hamed and look forward to participating in their care.  A copy of this report was sent to the requesting provider on this date.  Electronically Signed: D. Rowe Robert, PA-C 04/09/2021, 8:25 AM   I spent a total of  20 minutes   in face to face in clinical consultation, greater than 50% of which was counseling/coordinating care for CT guided bone marrow biopsy

## 2021-04-16 ENCOUNTER — Encounter: Payer: Self-pay | Admitting: Hematology & Oncology

## 2021-04-16 ENCOUNTER — Inpatient Hospital Stay: Payer: PPO | Admitting: Hematology & Oncology

## 2021-04-16 ENCOUNTER — Other Ambulatory Visit: Payer: Self-pay

## 2021-04-16 ENCOUNTER — Inpatient Hospital Stay: Payer: PPO | Attending: Physician Assistant

## 2021-04-16 VITALS — BP 148/73 | HR 80 | Temp 98.1°F | Resp 17 | Ht 60.0 in | Wt 127.0 lb

## 2021-04-16 DIAGNOSIS — D696 Thrombocytopenia, unspecified: Secondary | ICD-10-CM

## 2021-04-16 DIAGNOSIS — R918 Other nonspecific abnormal finding of lung field: Secondary | ICD-10-CM | POA: Diagnosis not present

## 2021-04-16 DIAGNOSIS — Z800 Family history of malignant neoplasm of digestive organs: Secondary | ICD-10-CM

## 2021-04-16 LAB — SAVE SMEAR(SSMR), FOR PROVIDER SLIDE REVIEW

## 2021-04-16 LAB — CMP (CANCER CENTER ONLY)
ALT: 55 U/L — ABNORMAL HIGH (ref 0–44)
AST: 51 U/L — ABNORMAL HIGH (ref 15–41)
Albumin: 4.3 g/dL (ref 3.5–5.0)
Alkaline Phosphatase: 64 U/L (ref 38–126)
Anion gap: 6 (ref 5–15)
BUN: 21 mg/dL (ref 8–23)
CO2: 28 mmol/L (ref 22–32)
Calcium: 10.1 mg/dL (ref 8.9–10.3)
Chloride: 107 mmol/L (ref 98–111)
Creatinine: 1.06 mg/dL — ABNORMAL HIGH (ref 0.44–1.00)
GFR, Estimated: 53 mL/min — ABNORMAL LOW (ref >60)
Glucose, Bld: 92 mg/dL (ref 70–99)
Potassium: 4.3 mmol/L (ref 3.5–5.1)
Sodium: 141 mmol/L (ref 135–145)
Total Bilirubin: 0.6 mg/dL (ref 0.3–1.2)
Total Protein: 6.5 g/dL (ref 6.5–8.1)

## 2021-04-16 LAB — CBC WITH DIFFERENTIAL (CANCER CENTER ONLY)
Abs Immature Granulocytes: 0.01 10*3/uL (ref 0.00–0.07)
Basophils Absolute: 0 10*3/uL (ref 0.0–0.1)
Basophils Relative: 1 %
Eosinophils Absolute: 0.1 10*3/uL (ref 0.0–0.5)
Eosinophils Relative: 2 %
HCT: 36.8 % (ref 36.0–46.0)
Hemoglobin: 12.1 g/dL (ref 12.0–15.0)
Immature Granulocytes: 0 %
Lymphocytes Relative: 28 %
Lymphs Abs: 1.3 10*3/uL (ref 0.7–4.0)
MCH: 32 pg (ref 26.0–34.0)
MCHC: 32.9 g/dL (ref 30.0–36.0)
MCV: 97.4 fL (ref 80.0–100.0)
Monocytes Absolute: 0.4 10*3/uL (ref 0.1–1.0)
Monocytes Relative: 8 %
Neutro Abs: 2.7 10*3/uL (ref 1.7–7.7)
Neutrophils Relative %: 61 %
Platelet Count: 110 10*3/uL — ABNORMAL LOW (ref 150–400)
RBC: 3.78 MIL/uL — ABNORMAL LOW (ref 3.87–5.11)
RDW: 12.8 % (ref 11.5–15.5)
WBC Count: 4.5 10*3/uL (ref 4.0–10.5)
nRBC: 0 % (ref 0.0–0.2)

## 2021-04-16 NOTE — Progress Notes (Signed)
Referral MD  Reason for Referral: Thrombocytopenia-mild  Chief Complaint  Patient presents with   New Patient (Initial Visit)  : My platelets are low.  HPI: [name] is obviously well-known to me.  She is very charming 81 year old white female.  She is a wife of one of our patients.  He passed on from myeloma a couple years ago.  She has been noted to have some thrombocytopenia.  This has been mild.  She has had no problems with bleeding.  Patient had multiple surgeries in the past.  She is had no problems of weight loss or weight gain.  Going back to 2010, her platelet count was 140,000.  Her white cell count is 4.1 and hemoglobin 12.5.  In 2013, her platelet count was 239,000.  In 2019, her platelet count was 128,000.  In 2020 her platelet count 103,000.  Clearly, this is been a chronic issue.  She actually did undergo a bone marrow biopsy and aspirate.  This was done on 04/08/2021.  The pathology report ([id number]) showed a normocellular marrow with trilineage hematopoiesis.  There was no evidence of leukemia, lymphoma or myelodysplasia.  Her megakaryocytes were relatively unremarkable.  She had a normal amount of megakaryocytes.  She did have a CT scan done of the chest recently.  This because of some abnormalities that have been noted previously.  Looks like she may have had some granulomatous disease in the liver.  Also, there is some nodularity in the lung base I think on the left.  She has had no problems with nausea or vomiting.  She has had no change in bowel or bladder habits.  The last mammogram that we have from July 2021 was unremarkable.  She does not smoke.  She does not drink.  There is no obvious occupational exposures.  Overall, her performance status is ECOG 1.    Past Medical History:  Diagnosis Date   Anxiety    Arthritis    degenerative lumbar spine    Chronic neck pain    Closed fracture of base of fifth metatarsal bone 16/0109   Left    Complication of anesthesia    at one time my blood pressure dropped -lumbar - on bp med at that time   Depression    Dysphagia    esophageal stretched 9-14    History of blood transfusion 2009   post - lumbar fusion    Hyperlipidemia    Hypertension    No longer takes med.   Osteoarthritis of left shoulder region    Mild, Dr. Mardelle Matte   Osteoporosis   :   Past Surgical History:  Procedure Laterality Date   ABDOMINAL HYSTERECTOMY     ANTERIOR CERVICAL DECOMP/DISCECTOMY FUSION N/A 11/20/2014   Procedure: ANTERIOR CERVICAL DECOMPRESSION/DISCECTOMY FUSION 2 LEVELS;  Surgeon: Phylliss Bob, MD;  Location: [place];  Service: Orthopedics;  Laterality: N/A;  Anterior cervical decompression fusion, cervical 3-4, cervical 4-5 with instrumentation and allograft   arm surgery Bilateral    transposed ulner nerve 10 (dr. Amedeo Plenty)   BACK SURGERY  2009   Dr Jeralyn Bennett SURGERY  01-2014   Dr Saintclair Halsted   carpal tunnell  2006   bilateral   CHOLECYSTECTOMY     KNEE SURGERY  9-11   right,  scope; left knee   NECK SURGERY  05/2008   Dr Saintclair Halsted   OOPHORECTOMY Bilateral    PARTIAL KNEE ARTHROPLASTY  01/17/2012   LEFT KNEE   PARTIAL KNEE ARTHROPLASTY  01/17/2012  Procedure: UNICOMPARTMENTAL KNEE;  Surgeon: Johnny Bridge, MD;  Location: [place];  Service: Orthopedics;  Laterality: Left;   PARTIAL KNEE ARTHROPLASTY Right 04/21/2020   Procedure: UNICOMPARTMENTAL KNEE;  Surgeon: Marchia Bond, MD;  Location: [facility];  Service: Orthopedics;  Laterality: Right;   RHINOPLASTY  1980   SHOULDER SURGERY Bilateral    x 2/Dr. Collier Salina   TONSILLECTOMY    :   Current Outpatient Medications:    acetaminophen (TYLENOL) 500 MG tablet, Take 1,000 mg by mouth in the morning and at bedtime., Disp: , Rfl:    buPROPion (WELLBUTRIN) 100 MG tablet, Take 1 tablet (100 mg total) by mouth 2 (two) times daily., Disp: 180 tablet, Rfl: 1   clonazePAM (KLONOPIN) 0.5 MG tablet, TAKE 1 TABLET(0.5 MG) BY MOUTH THREE TIMES DAILY AS NEEDED  FOR ANXIETY, Disp: 270 tablet, Rfl: 1   escitalopram (LEXAPRO) 10 MG tablet, TAKE 1 TABLET(10 MG) BY MOUTH DAILY, Disp: 90 tablet, Rfl: 1   Multiple Vitamin (MULTIVITAMIN) tablet, Take 1 tablet by mouth daily. Reported on 08/25/2015, Disp: , Rfl:    pantoprazole (PROTONIX) 40 MG tablet, Take 1 tablet (40 mg total) by mouth daily before breakfast., Disp: 90 tablet, Rfl: 1   Propylene Glycol (SYSTANE BALANCE) 0.6 % SOLN, Place 1 drop into both eyes daily., Disp: , Rfl:    simvastatin (ZOCOR) 40 MG tablet, Take 1 tablet (40 mg total) by mouth at bedtime., Disp: 90 tablet, Rfl: 1   vitamin C (ASCORBIC ACID) 500 MG tablet, Take 500 mg by mouth daily., Disp: , Rfl: :  :   Allergies  Allergen Reactions   Tape Other (See Comments)    White adhesive tape -blisters   Alendronate Sodium Other (See Comments)    REACTION: jaw pain   Cortizone-10 [Hydrocortisone] Other (See Comments)    Pt received cortizone shot, and became very flushed, and hot, blisters, raw areas around lips & ears    Oxycodone-Acetaminophen Hives   Penicillins Swelling and Rash  :   Family History  Problem Relation Age of Onset   Prostate cancer Father    Diabetes Sister    Heart disease Sister        sister and brothers x 2   Kidney disease Sister    Kidney disease Brother    Heart disease Brother    Stomach cancer Maternal Aunt    Colon cancer Neg Hx    Breast cancer Neg Hx    Esophageal cancer Neg Hx    Pancreatic cancer Neg Hx   :   Social History   Socioeconomic History   Marital status: Widowed    Spouse name: [name].   Number of children: 4   Years of education: Not on file   Highest education level: Not on file  Occupational History   Occupation: stay home   Tobacco Use   Smoking status: Never   Smokeless tobacco: Never  Vaping Use   Vaping Use: Never used  Substance and Sexual Activity   Alcohol use: Not Currently    Comment: rare   Drug use: No   Sexual activity: Not Currently  Other  Topics Concern   Not on file  Social History Narrative   Lost husband 2021   Household: pt and son [name]   Children: 3 daughters and 1 son   Social Determinants of Health   Financial Resource Strain: Not on file  Food Insecurity: Not on file  Transportation Needs: Not on file  Physical Activity: Not  on file  Stress: Not on file  Social Connections: Not on file  Intimate Partner Violence: Not on file  :  Review of Systems  Constitutional:  Positive for malaise/fatigue.  HENT: Negative.    Eyes: Negative.   Respiratory: Negative.    Cardiovascular: Negative.   Gastrointestinal: Negative.   Genitourinary: Negative.   Musculoskeletal: Negative.   Skin: Negative.   Neurological: Negative.   Endo/Heme/Allergies: Negative.   Psychiatric/Behavioral: Negative.      Exam: _0 @ Physical Exam Vitals reviewed.  HENT:     Head: Normocephalic and atraumatic.  Eyes:     Pupils: Pupils are equal, round, and reactive to light.  Cardiovascular:     Rate and Rhythm: Normal rate and regular rhythm.     Heart sounds: Normal heart sounds.  Pulmonary:     Effort: Pulmonary effort is normal.     Breath sounds: Normal breath sounds.  Abdominal:     General: Bowel sounds are normal.     Palpations: Abdomen is soft.  Musculoskeletal:        General: No tenderness or deformity. Normal range of motion.     Cervical back: Normal range of motion.  Lymphadenopathy:     Cervical: No cervical adenopathy.  Skin:    General: Skin is warm and dry.     Findings: No erythema or rash.  Neurological:     Mental Status: She is alert and oriented to person, place, and time.  Psychiatric:        Behavior: Behavior normal.        Thought Content: Thought content normal.        Judgment: Judgment normal.      Recent Labs    04/16/21 1336  WBC 4.5  HGB 12.1  HCT 36.8  PLT 110*    Recent Labs    04/16/21 1336  NA 141  K 4.3  CL 107  CO2 28  GLUCOSE 92  BUN 21  CREATININE  1.06*  CALCIUM 10.1    Blood smear review: Normochromic and normocytic population of red blood cells.  There are no nucleated red blood cells.  I see no teardrop cells.  She has no rouleaux formation.  White blood cells appear normal in morphology and maturation.  There are no hypersegmented polys.  There are no immature myeloid or lymphoid forms.  Platelets are adequate in number and size.  She has well granulated platelets.  Platelets are large.  Pathology: None    Assessment and Plan: Ms. Schirmer is a very charming 42-year-old white female.  She has mild thrombocytopenia.  I suppose, at 81 years old, she may have myelodysplasia.  However, the bone marrow certainly did not show anything that was significant.  She may have a mild form of immune thrombocytopenia.  Her platelet count has been a little bit low for at least 10-11 years.  Again, she has sort of maintained a range of her platelets.  At this point, I think we can just follow her along.  I think if her platelet count begins to drop, we could utilize Nplate to try to get her platelet count back up if needed.  I do not see that we have to do any additional test on her.  I do not think we have to send off any molecular studies.  It was very nice to see her again.  I loved seeing her with her husband when we took care of him.  They were always so nice.  He is still sorely missed by all of our office staff.  I will get her back in 3 months to see how she is doing.

## 2021-04-19 ENCOUNTER — Encounter (HOSPITAL_COMMUNITY): Payer: Self-pay | Admitting: Hematology & Oncology

## 2021-04-20 ENCOUNTER — Other Ambulatory Visit: Payer: Self-pay | Admitting: Internal Medicine

## 2021-05-20 LAB — SURGICAL PATHOLOGY

## 2021-06-10 ENCOUNTER — Ambulatory Visit (INDEPENDENT_AMBULATORY_CARE_PROVIDER_SITE_OTHER): Payer: PPO | Admitting: Internal Medicine

## 2021-06-10 ENCOUNTER — Encounter: Payer: Self-pay | Admitting: Internal Medicine

## 2021-06-10 VITALS — BP 126/82 | HR 69 | Temp 98.3°F | Resp 18 | Ht 60.0 in | Wt 137.2 lb

## 2021-06-10 DIAGNOSIS — K219 Gastro-esophageal reflux disease without esophagitis: Secondary | ICD-10-CM | POA: Diagnosis not present

## 2021-06-10 DIAGNOSIS — F419 Anxiety disorder, unspecified: Secondary | ICD-10-CM

## 2021-06-10 DIAGNOSIS — D61818 Other pancytopenia: Secondary | ICD-10-CM

## 2021-06-10 DIAGNOSIS — F32A Depression, unspecified: Secondary | ICD-10-CM | POA: Diagnosis not present

## 2021-06-10 DIAGNOSIS — R634 Abnormal weight loss: Secondary | ICD-10-CM | POA: Diagnosis not present

## 2021-06-10 NOTE — Patient Instructions (Signed)
Decrease Wellbutrin to 1 tablet at bedtime for 2 weeks, then you can stop it ? ?Decrease pantoprazole to half tablet in the morning for 2 weeks then you can stop it ? ?You can restart the medications anytime if the need arise ?  ? ?GO TO THE FRONT DESK, [place] ?Come back for a physical exam by 08/2021 ?

## 2021-06-10 NOTE — Progress Notes (Signed)
? ?Subjective:  ? ? Patient ID: Rebecca [name], female    DOB: December 08, 1940, 81 y.o.   MRN: [id number] ? ?DOS:  06/10/2021 ?Type of visit - description: f/u ? ?Since the last office visit is doing well. ?Saw hematology, office note and labs reviewed. ?Depression: Feeling very well,    Stop Wellbutrin?Marland Kitchen ?Weight loss: Resolved ?Denies any GERD symptoms.  Stop Protonix?. ? ?Wt Readings from Last 3 Encounters:  ?06/10/21 137 lb 4 oz (62.3 kg)  ?04/16/21 127 lb (57.6 kg)  ?04/09/21 123 lb (55.8 kg)  ? ? ? ?Review of Systems ?See above  ? ?Past Medical History:  ?Diagnosis Date  ? Anxiety   ? Arthritis   ? degenerative lumbar spine   ? Chronic neck pain   ? Closed fracture of base of fifth metatarsal bone 01/2018  ? Left  ? Complication of anesthesia   ? at one time my blood pressure dropped -lumbar - on bp med at that time  ? Depression   ? Dysphagia   ? esophageal stretched 9-14   ? History of blood transfusion 2009  ? post - lumbar fusion   ? Hyperlipidemia   ? Hypertension   ? No longer takes med.  ? Osteoarthritis of left shoulder region   ? Mild, Dr. Mardelle Matte  ? Osteoporosis   ? ? ?Past Surgical History:  ?Procedure Laterality Date  ? ABDOMINAL HYSTERECTOMY    ? ANTERIOR CERVICAL DECOMP/DISCECTOMY FUSION N/A 11/20/2014  ? Procedure: ANTERIOR CERVICAL DECOMPRESSION/DISCECTOMY FUSION 2 LEVELS;  Surgeon: Phylliss Bob, MD;  Location: [facility];  Service: Orthopedics;  Laterality: N/A;  Anterior cervical decompression fusion, cervical 3-4, cervical 4-5 with instrumentation and allograft  ? arm surgery Bilateral   ? transposed ulner nerve 10 (dr. Amedeo Plenty)  ? BACK SURGERY  2009  ? Dr Saintclair Halsted  ? [place]  01-2014  ? Dr Saintclair Halsted  ? carpal tunnell  2006  ? bilateral  ? CHOLECYSTECTOMY    ? KNEE SURGERY  9-11  ? right,  scope; left knee  ? NECK SURGERY  05/2008  ? Dr Saintclair Halsted  ? OOPHORECTOMY Bilateral   ? PARTIAL KNEE ARTHROPLASTY  01/17/2012  ? LEFT KNEE  ? PARTIAL KNEE ARTHROPLASTY  01/17/2012  ? Procedure: UNICOMPARTMENTAL KNEE;  Surgeon:  Johnny Bridge, MD;  Location: [facility];  Service: Orthopedics;  Laterality: Left;  ? PARTIAL KNEE ARTHROPLASTY Right 04/21/2020  ? Procedure: UNICOMPARTMENTAL KNEE;  Surgeon: Marchia Bond, MD;  Location: [facility];  Service: Orthopedics;  Laterality: Right;  ? RHINOPLASTY  1980  ? SHOULDER SURGERY Bilateral   ? x 2/Dr. Collier Salina  ? TONSILLECTOMY    ? ? ?Current Outpatient Medications  ?Medication Instructions  ? acetaminophen (TYLENOL) 1,000 mg, Oral, 2 times daily  ? buPROPion (WELLBUTRIN) 100 mg, Oral, 2 times daily  ? clonazePAM (KLONOPIN) 0.5 MG tablet TAKE 1 TABLET(0.5 MG) BY MOUTH THREE TIMES DAILY AS NEEDED FOR ANXIETY  ? escitalopram (LEXAPRO) 10 MG tablet TAKE 1 TABLET(10 MG) BY MOUTH DAILY  ? Multiple Vitamin (MULTIVITAMIN) tablet 1 tablet, Oral, Daily, Reported on 08/25/2015  ? pantoprazole (PROTONIX) 40 mg, Oral, Daily before breakfast  ? Propylene Glycol (SYSTANE BALANCE) 0.6 % SOLN 1 drop, Both Eyes, Daily  ? simvastatin (ZOCOR) 40 MG tablet TAKE 1 TABLET(40 MG) BY MOUTH AT BEDTIME  ? vitamin C (ASCORBIC ACID) 500 mg, Oral, Daily  ? ? ?   ?Objective:  ? Physical Exam ?BP 126/82 (BP Location: Left Arm, Patient Position: Sitting, Cuff Size: Small)  Pulse 69   Temp 98.3 ?F (36.8 ?C) (Oral)   Resp 18   Ht 5' (1.524 m)   Wt 137 lb 4 oz (62.3 kg)   SpO2 96%   BMI 26.80 kg/m?  ?General:   ?Well developed, NAD, BMI noted. ?HEENT:  ?Normocephalic . Face symmetric, atraumatic ?Skin: Not pale. Not jaundice ?Neurologic:  ?alert & oriented X3.  ?Speech normal, gait appropriate for age and unassisted ?Psych--  ?Cognition and judgment appear intact.  ?Cooperative with normal attention span and concentration.  ?Behavior appropriate. ?No anxious or depressed appearing.  ? ?   ?Assessment   ? ? Assessment  ?HTN -- on no med since ~ 2009 ?Hyperlipidemia ?Anxiety depression: onset Fall 2016 d/t husband health, lost sister 03-2015. rx lexapro 02-2015 ?GI ?--GERD ?--Esophageal stricture stretched 2014  ?--EGD/2017--->   gastritis, bx---reactive gastropathy, no H. pylori ?MSK: ?--DJD; Chronic neck pain ;  spinal stenosis;  multiple surgeries ?Osteoporosis ?DEXA osteoprosis 2006  (per pt) ?DEXA  11-08 normal   ?DEXA  9-12 mild osteopenia, rec exercise, ca and vit D ?T score 05-2015:  (-) 1.4 ?T score 06/19/2017:  (-) 2.1, RX  vit d, exercise , recheck in 2 years ?T score -1.8   08/2019 ?[place] 03-2015 - no AAA ? ?PLAN: ?Anxiety, depression: Since the last time, she is doing very well, keeping herself busy, knitting, counseling other people about loss. ?She would like to stop Wellbutrin, I think that is okay, see AVS.  Continue Lexapro, she hardly ever takes clonazepam. ?GERD: History of GERD, currently asymptomatic, would like to stop PPIs, will do.  See AVS. ?Pancytopenia: Has seen hematology, work-up done, no obvious or serious etiology, Rx observation. ?Weight loss: Resolved ?RTC 08/2021 CPX ? ?This visit occurred during the SARS-CoV-2 public health emergency.  Safety protocols were in place, including screening questions prior to the visit, additional usage of staff PPE, and extensive cleaning of exam room while observing appropriate contact time as indicated for disinfecting solutions.  ? ?

## 2021-06-11 NOTE — Assessment & Plan Note (Signed)
Anxiety, depression: Since the last time, she is doing very well, keeping herself busy, knitting, counseling other people about loss. ?She would like to stop Wellbutrin, I think that is okay, see AVS.  Continue Lexapro, she hardly ever takes clonazepam. ?GERD: History of GERD, currently asymptomatic, would like to stop PPIs, will do.  See AVS. ?Pancytopenia: Has seen hematology, work-up done, no obvious or serious etiology, Rx observation. ?Weight loss: Resolved ?RTC 08/2021 CPX ?

## 2021-06-30 ENCOUNTER — Ambulatory Visit: Payer: PPO | Admitting: Internal Medicine

## 2021-07-01 DIAGNOSIS — F411 Generalized anxiety disorder: Secondary | ICD-10-CM | POA: Diagnosis not present

## 2021-07-01 DIAGNOSIS — D696 Thrombocytopenia, unspecified: Secondary | ICD-10-CM | POA: Diagnosis not present

## 2021-07-01 DIAGNOSIS — Z9849 Cataract extraction status, unspecified eye: Secondary | ICD-10-CM | POA: Diagnosis not present

## 2021-07-01 DIAGNOSIS — M1990 Unspecified osteoarthritis, unspecified site: Secondary | ICD-10-CM | POA: Diagnosis not present

## 2021-07-01 DIAGNOSIS — M8580 Other specified disorders of bone density and structure, unspecified site: Secondary | ICD-10-CM | POA: Diagnosis not present

## 2021-07-01 DIAGNOSIS — E663 Overweight: Secondary | ICD-10-CM | POA: Diagnosis not present

## 2021-07-01 DIAGNOSIS — F3341 Major depressive disorder, recurrent, in partial remission: Secondary | ICD-10-CM | POA: Diagnosis not present

## 2021-07-01 DIAGNOSIS — I10 Essential (primary) hypertension: Secondary | ICD-10-CM | POA: Diagnosis not present

## 2021-07-01 DIAGNOSIS — K5900 Constipation, unspecified: Secondary | ICD-10-CM | POA: Diagnosis not present

## 2021-07-01 DIAGNOSIS — H9193 Unspecified hearing loss, bilateral: Secondary | ICD-10-CM | POA: Diagnosis not present

## 2021-07-01 DIAGNOSIS — E785 Hyperlipidemia, unspecified: Secondary | ICD-10-CM | POA: Diagnosis not present

## 2021-07-07 ENCOUNTER — Ambulatory Visit (INDEPENDENT_AMBULATORY_CARE_PROVIDER_SITE_OTHER): Payer: PPO

## 2021-07-07 DIAGNOSIS — Z0000 Encounter for general adult medical examination without abnormal findings: Secondary | ICD-10-CM

## 2021-07-07 NOTE — Progress Notes (Addendum)
? ?Subjective:  ? Rebecca [name] is a 81 y.o. female who presents for Medicare Annual (Subsequent) preventive examination. ? ?I connected with  Rebecca [name] [facility] on 07/07/21 by a audio enabled telemedicine application and verified that I am speaking with the correct person using two identifiers. ? ?Patient Location: Home ? ?Provider Location: Office/Clinic ? ?I discussed the limitations of evaluation and management by telemedicine. The patient expressed understanding and agreed to proceed.  ? ?Review of Systems    ? ?Cardiac Risk Factors include: advanced age (>37mn, >>41women);dyslipidemia;hypertension ? ?   ?Objective:  ?  ?Today's Vitals  ? 07/07/21 1319  ?PainSc: 2   ? ?There is no height or weight on file to calculate BMI. ? ? ?  07/07/2021  ?  1:21 PM 03/17/2021  ? 11:56 AM 04/21/2020  ?  5:37 AM 04/09/2020  ?  1:04 PM 05/22/2017  ?  8:32 AM 08/22/2015  ?  3:58 PM 05/11/2015  ?  2:41 PM  ?Advanced Directives  ?Does Patient Have a Medical Advance Directive? Yes Yes Yes Yes Yes No Yes  ?Type of AParamedicof ARiverton[place] of facility DNR (pink MOST or yellow form);Living will  Living will;Healthcare Power of [place]Living will [place]  [place] will  ?Does patient want to make changes to medical advance directive?   No - Patient declined  No - Patient declined    ?Copy of [place] Chart? Yes - validated most recent copy scanned in chart (See row information)    Yes  No - copy requested  ? ? ?Current Medications (verified) ?Outpatient Encounter Medications as of 07/07/2021  ?Medication Sig  ? acetaminophen (TYLENOL) 500 MG tablet Take 1,000 mg by mouth in the morning and at bedtime.  ? buPROPion (WELLBUTRIN) 100 MG tablet Take 100 mg by mouth at bedtime.  ? clonazePAM (KLONOPIN) 0.5 MG tablet TAKE 1 TABLET(0.5 MG) BY MOUTH THREE TIMES DAILY AS NEEDED FOR ANXIETY  ? escitalopram  (LEXAPRO) 10 MG tablet TAKE 1 TABLET(10 MG) BY MOUTH DAILY  ? Multiple Vitamin (MULTIVITAMIN) tablet Take 1 tablet by mouth daily. Reported on 08/25/2015  ? Propylene Glycol (SYSTANE BALANCE) 0.6 % SOLN Place 1 drop into both eyes daily.  ? simvastatin (ZOCOR) 40 MG tablet TAKE 1 TABLET(40 MG) BY MOUTH AT BEDTIME  ? vitamin C (ASCORBIC ACID) 500 MG tablet Take 500 mg by mouth daily.  ? ?No facility-administered encounter medications on file as of 07/07/2021.  ? ? ?Allergies (verified) ?Tape, Alendronate sodium, Cortizone-10 [hydrocortisone], Oxycodone-acetaminophen, and Penicillins  ? ?History: ?Past Medical History:  ?Diagnosis Date  ? Anxiety   ? Arthritis   ? degenerative lumbar spine   ? Chronic neck pain   ? Closed fracture of base of fifth metatarsal bone 01/2018  ? Left  ? Complication of anesthesia   ? at one time my blood pressure dropped -lumbar - on bp med at that time  ? Depression   ? Dysphagia   ? esophageal stretched 9-14   ? History of blood transfusion 2009  ? post - lumbar fusion   ? Hyperlipidemia   ? Hypertension   ? No longer takes med.  ? Osteoarthritis of left shoulder region   ? Mild, Dr. LMardelle Matte ? Osteoporosis   ? ?Past Surgical History:  ?Procedure Laterality Date  ? ABDOMINAL HYSTERECTOMY    ? ANTERIOR CERVICAL DECOMP/DISCECTOMY FUSION N/A 11/20/2014  ? Procedure:  ANTERIOR CERVICAL DECOMPRESSION/DISCECTOMY FUSION 2 LEVELS;  Surgeon: Phylliss Bob, MD;  Location: [facility];  Service: Orthopedics;  Laterality: N/A;  Anterior cervical decompression fusion, cervical 3-4, cervical 4-5 with instrumentation and allograft  ? arm surgery Bilateral   ? transposed ulner nerve 10 (dr. Amedeo Plenty)  ? BACK SURGERY  2009  ? Dr Saintclair Halsted  ? [place]  01-2014  ? Dr Saintclair Halsted  ? carpal tunnell  2006  ? bilateral  ? CHOLECYSTECTOMY    ? KNEE SURGERY  9-11  ? right,  scope; left knee  ? NECK SURGERY  05/2008  ? Dr Saintclair Halsted  ? OOPHORECTOMY Bilateral   ? PARTIAL KNEE ARTHROPLASTY  01/17/2012  ? LEFT KNEE  ? PARTIAL KNEE ARTHROPLASTY   01/17/2012  ? Procedure: UNICOMPARTMENTAL KNEE;  Surgeon: Johnny Bridge, MD;  Location: [facility];  Service: Orthopedics;  Laterality: Left;  ? PARTIAL KNEE ARTHROPLASTY Right 04/21/2020  ? Procedure: UNICOMPARTMENTAL KNEE;  Surgeon: Marchia Bond, MD;  Location: [facility];  Service: Orthopedics;  Laterality: Right;  ? RHINOPLASTY  1980  ? SHOULDER SURGERY Bilateral   ? x 2/Dr. Collier Salina  ? TONSILLECTOMY    ? ?Family History  ?Problem Relation Age of Onset  ? Prostate cancer Father   ? Diabetes Sister   ? Heart disease Sister   ?     sister and brothers x 2  ? Kidney disease Sister   ? Kidney disease Brother   ? Heart disease Brother   ? Stomach cancer Maternal Aunt   ? Colon cancer Neg Hx   ? Breast cancer Neg Hx   ? Esophageal cancer Neg Hx   ? Pancreatic cancer Neg Hx   ? ?Social History  ? ?Socioeconomic History  ? Marital status: Widowed  ?  Spouse name: Rebecca [name].  ? Number of children: 4  ? Years of education: Not on file  ? Highest education level: Not on file  ?Occupational History  ? Occupation: stay home   ?Tobacco Use  ? Smoking status: Never  ? Smokeless tobacco: Never  ?Vaping Use  ? Vaping Use: Never used  ?Substance and Sexual Activity  ? Alcohol use: Not Currently  ?  Comment: rare  ? Drug use: No  ? Sexual activity: Not Currently  ?Other Topics Concern  ? Not on file  ?Social History Narrative  ? Lost husband 2021  ? Household: pt and son Rebecca [name]  ? Children: 3 daughters and 1 son  ? ?Social Determinants of Health  ? ?Financial Resource Strain: Low Risk   ? Difficulty of Paying Living Expenses: Not hard at all  ?Food Insecurity: No Food Insecurity  ? Worried About Charity fundraiser in the Last Year: Never true  ? Ran Out of Food in the Last Year: Never true  ?Transportation Needs: No Transportation Needs  ? Lack of Transportation (Medical): No  ? Lack of Transportation (Non-Medical): No  ?Physical Activity: Sufficiently Active  ? Days of Exercise per Week: 7 days  ? Minutes of Exercise per Session: 60 min   ?Stress: No Stress Concern Present  ? Feeling of Stress : Not at all  ?Social Connections: Moderately Integrated  ? Frequency of Communication with Friends and Family: More than three times a week  ? Frequency of Social Gatherings with Friends and Family: More than three times a week  ? Attends Religious Services: More than 4 times per year  ? Active Member of Clubs or Organizations: Yes  ? Attends Archivist  Meetings: More than 4 times per year  ? Marital Status: Widowed  ? ? ?Tobacco Counseling ?Counseling given: Not Answered ? ? ?Clinical Intake: ? ?Pre-visit preparation completed: Yes ? ?Pain : 0-10 ?Pain Score: 2  ?Pain Type: Acute pain ?Pain Location: Foot ?Pain Orientation: Right ?Pain Descriptors / Indicators: Aching, Dull, Sore ?Pain Onset: Yesterday ?Pain Frequency: Occasional ? ?  ? ?Nutritional Risks: None ?Diabetes: No ? ?How often do you need to have someone help you when you read instructions, pamphlets, or other written materials from your doctor or pharmacy?: 1 - Never ? ?Diabetic?No ? ?Interpreter Needed?: No ? ?Information entered by :: Louie Meaders ? ? ?Activities of Daily Living ? ?  07/07/2021  ?  1:23 PM  ?In your present state of health, do you have any difficulty performing the following activities:  ?Hearing? 0  ?Vision? 0  ?Difficulty concentrating or making decisions? 0  ?Walking or climbing stairs? 0  ?Dressing or bathing? 0  ?Doing errands, shopping? 0  ?Preparing Food and eating ? N  ?Using the Toilet? N  ?In the past six months, have you accidently leaked urine? N  ?Do you have problems with loss of bowel control? N  ?Managing your Medications? N  ?Managing your Finances? N  ?Housekeeping or managing your Housekeeping? N  ? ? ?Patient Care Team: ?Colon Branch, MD as PCP - General ?Kary Kos, MD as Consulting Physician (Neurosurgery) ?Marchia Bond, MD as Consulting Physician (Orthopedic Surgery) ?Phylliss Bob, MD as Consulting Physician (Orthopedic Surgery) ?Constance Haw, MD as Consulting Physician (Cardiology) ?Day, Melvenia Beam, [facility] (Inactive) as Pharmacist (Pharmacist) ? ?Indicate any recent Medical Services you may have received from other than [facility] providers in

## 2021-07-07 NOTE — Patient Instructions (Signed)
Rebecca [name] , ?Thank you for taking time to come for your Medicare Wellness Visit. I appreciate your ongoing commitment to your health goals. Please review the following plan we discussed and let me know if I can assist you in the future.  ? ?Screening recommendations/referrals: ?Colonoscopy: no longer needed ?Mammogram: no longer needed ?Bone Density: 09/10/19 due 09/09/21 ?Recommended yearly ophthalmology/optometry visit for glaucoma screening and checkup ?Recommended yearly dental visit for hygiene and checkup ? ?Vaccinations: ?Influenza vaccine: up to date ?Pneumococcal vaccine: up to date ?Tdap vaccine: up to date ?Shingles vaccine: Due-May obtain vaccine at our office or your local pharmacy.    ?Covid-19:Due-May obtain vaccine at our office or your local pharmacy.  ? ?Advanced directives: yes, on file ? ?Conditions/risks identified: see problem list ? ?Next appointment: Follow up in one year for your annual wellness visit  ? ? ?Preventive Care 29 Years and Older, Female ?Preventive care refers to lifestyle choices and visits with your health care provider that can promote health and wellness. ?What does preventive care include? ?A yearly physical exam. This is also called an annual well check. ?Dental exams once or twice a year. ?Routine eye exams. Ask your health care provider how often you should have your eyes checked. ?Personal lifestyle choices, including: ?Daily care of your teeth and gums. ?Regular physical activity. ?Eating a healthy diet. ?Avoiding tobacco and drug use. ?Limiting alcohol use. ?Practicing safe sex. ?Taking low-dose aspirin every day. ?Taking vitamin and mineral supplements as recommended by your health care provider. ?What happens during an annual well check? ?The services and screenings done by your health care provider during your annual well check will depend on your age, overall health, lifestyle risk factors, and family history of disease. ?Counseling  ?Your health care provider may  ask you questions about your: ?Alcohol use. ?Tobacco use. ?Drug use. ?Emotional well-being. ?Home and relationship well-being. ?Sexual activity. ?Eating habits. ?History of falls. ?Memory and ability to understand (cognition). ?Work and work Statistician. ?Reproductive health. ?Screening  ?You may have the following tests or measurements: ?Height, weight, and BMI. ?Blood pressure. ?Lipid and cholesterol levels. These may be checked every 5 years, or more frequently if you are over 41 years old. ?Skin check. ?Lung cancer screening. You may have this screening every year starting at age 101 if you have a 30-pack-year history of smoking and currently smoke or have quit within the past 15 years. ?Fecal occult blood test (FOBT) of the stool. You may have this test every year starting at age 56. ?Flexible sigmoidoscopy or colonoscopy. You may have a sigmoidoscopy every 5 years or a colonoscopy every 10 years starting at age 64. ?Hepatitis C blood test. ?Hepatitis B blood test. ?Sexually transmitted disease (STD) testing. ?Diabetes screening. This is done by checking your blood sugar (glucose) after you have not eaten for a while (fasting). You may have this done every 1-3 years. ?Bone density scan. This is done to screen for osteoporosis. You may have this done starting at age 48. ?Mammogram. This may be done every 1-2 years. Talk to your health care provider about how often you should have regular mammograms. ?Talk with your health care provider about your test results, treatment options, and if necessary, the need for more tests. ?Vaccines  ?Your health care provider may recommend certain vaccines, such as: ?Influenza vaccine. This is recommended every year. ?Tetanus, diphtheria, and acellular pertussis (Tdap, Td) vaccine. You may need a Td booster every 10 years. ?Zoster vaccine. You may need this after age  60. ?Pneumococcal 13-valent conjugate (PCV13) vaccine. One dose is recommended after age 47. ?Pneumococcal  polysaccharide (PPSV23) vaccine. One dose is recommended after age 75. ?Talk to your health care provider about which screenings and vaccines you need and how often you need them. ?This information is not intended to replace advice given to you by your health care provider. Make sure you discuss any questions you have with your health care provider. ?Document Released: 03/27/2015 Document Revised: 11/18/2015 Document Reviewed: 12/30/2014 ?Elsevier Interactive Patient Education ? 2017 [place]. ? ?Fall Prevention in the Home ?Falls can cause injuries. They can happen to people of all ages. There are many things you can do to make your home safe and to help prevent falls. ?What can I do on the outside of my home? ?Regularly fix the edges of walkways and driveways and fix any cracks. ?Remove anything that might make you trip as you walk through a door, such as a raised step or threshold. ?Trim any bushes or trees on the path to your home. ?Use bright outdoor lighting. ?Clear any walking paths of anything that might make someone trip, such as rocks or tools. ?Regularly check to see if handrails are loose or broken. Make sure that both sides of any steps have handrails. ?Any raised decks and porches should have guardrails on the edges. ?Have any leaves, snow, or ice cleared regularly. ?Use sand or salt on walking paths during winter. ?Clean up any spills in your garage right away. This includes oil or grease spills. ?What can I do in the bathroom? ?Use night lights. ?Install grab bars by the toilet and in the tub and shower. Do not use towel bars as grab bars. ?Use non-skid mats or decals in the tub or shower. ?If you need to sit down in the shower, use a plastic, non-slip stool. ?Keep the floor dry. Clean up any water that spills on the floor as soon as it happens. ?Remove soap buildup in the tub or shower regularly. ?Attach bath mats securely with double-sided non-slip rug tape. ?Do not have throw rugs and other  things on the floor that can make you trip. ?What can I do in the bedroom? ?Use night lights. ?Make sure that you have a light by your bed that is easy to reach. ?Do not use any sheets or blankets that are too big for your bed. They should not hang down onto the floor. ?Have a firm chair that has side arms. You can use this for support while you get dressed. ?Do not have throw rugs and other things on the floor that can make you trip. ?What can I do in the kitchen? ?Clean up any spills right away. ?Avoid walking on wet floors. ?Keep items that you use a lot in easy-to-reach places. ?If you need to reach something above you, use a strong step stool that has a grab bar. ?Keep electrical cords out of the way. ?Do not use floor polish or wax that makes floors slippery. If you must use wax, use non-skid floor wax. ?Do not have throw rugs and other things on the floor that can make you trip. ?What can I do with my stairs? ?Do not leave any items on the stairs. ?Make sure that there are handrails on both sides of the stairs and use them. Fix handrails that are broken or loose. Make sure that handrails are as long as the stairways. ?Check any carpeting to make sure that it is firmly attached to the stairs. Fix  any carpet that is loose or worn. ?Avoid having throw rugs at the top or bottom of the stairs. If you do have throw rugs, attach them to the floor with carpet tape. ?Make sure that you have a light switch at the top of the stairs and the bottom of the stairs. If you do not have them, ask someone to add them for you. ?What else can I do to help prevent falls? ?Wear shoes that: ?Do not have high heels. ?Have rubber bottoms. ?Are comfortable and fit you well. ?Are closed at the toe. Do not wear sandals. ?If you use a stepladder: ?Make sure that it is fully opened. Do not climb a closed stepladder. ?Make sure that both sides of the stepladder are locked into place. ?Ask someone to hold it for you, if possible. ?Clearly  mark and make sure that you can see: ?Any grab bars or handrails. ?First and last steps. ?Where the edge of each step is. ?Use tools that help you move around (mobility aids) if they are needed. These include: ?

## 2021-07-15 ENCOUNTER — Inpatient Hospital Stay: Payer: PPO | Admitting: Hematology & Oncology

## 2021-07-15 ENCOUNTER — Encounter: Payer: Self-pay | Admitting: Hematology & Oncology

## 2021-07-15 ENCOUNTER — Other Ambulatory Visit: Payer: Self-pay

## 2021-07-15 ENCOUNTER — Telehealth: Payer: Self-pay | Admitting: *Deleted

## 2021-07-15 ENCOUNTER — Inpatient Hospital Stay: Payer: PPO | Attending: Physician Assistant

## 2021-07-15 VITALS — BP 169/63 | HR 68 | Temp 98.5°F | Resp 16 | Ht 60.0 in | Wt 143.0 lb

## 2021-07-15 DIAGNOSIS — D696 Thrombocytopenia, unspecified: Secondary | ICD-10-CM

## 2021-07-15 DIAGNOSIS — D649 Anemia, unspecified: Secondary | ICD-10-CM | POA: Diagnosis not present

## 2021-07-15 LAB — SAVE SMEAR(SSMR), FOR PROVIDER SLIDE REVIEW

## 2021-07-15 LAB — CMP (CANCER CENTER ONLY)
ALT: 60 U/L — ABNORMAL HIGH (ref 0–44)
AST: 52 U/L — ABNORMAL HIGH (ref 15–41)
Albumin: 4.1 g/dL (ref 3.5–5.0)
Alkaline Phosphatase: 66 U/L (ref 38–126)
Anion gap: 8 (ref 5–15)
BUN: 26 mg/dL — ABNORMAL HIGH (ref 8–23)
CO2: 27 mmol/L (ref 22–32)
Calcium: 10 mg/dL (ref 8.9–10.3)
Chloride: 106 mmol/L (ref 98–111)
Creatinine: 1.28 mg/dL — ABNORMAL HIGH (ref 0.44–1.00)
GFR, Estimated: 42 mL/min — ABNORMAL LOW (ref >60)
Glucose, Bld: 90 mg/dL (ref 70–99)
Potassium: 4.6 mmol/L (ref 3.5–5.1)
Sodium: 141 mmol/L (ref 135–145)
Total Bilirubin: 0.5 mg/dL (ref 0.3–1.2)
Total Protein: 6.7 g/dL (ref 6.5–8.1)

## 2021-07-15 LAB — CBC WITH DIFFERENTIAL (CANCER CENTER ONLY)
Abs Immature Granulocytes: 0.02 10*3/uL (ref 0.00–0.07)
Basophils Absolute: 0 10*3/uL (ref 0.0–0.1)
Basophils Relative: 1 %
Eosinophils Absolute: 0.1 10*3/uL (ref 0.0–0.5)
Eosinophils Relative: 3 %
HCT: 35.2 % — ABNORMAL LOW (ref 36.0–46.0)
Hemoglobin: 11.6 g/dL — ABNORMAL LOW (ref 12.0–15.0)
Immature Granulocytes: 1 %
Lymphocytes Relative: 28 %
Lymphs Abs: 1.1 10*3/uL (ref 0.7–4.0)
MCH: 32 pg (ref 26.0–34.0)
MCHC: 33 g/dL (ref 30.0–36.0)
MCV: 97 fL (ref 80.0–100.0)
Monocytes Absolute: 0.3 10*3/uL (ref 0.1–1.0)
Monocytes Relative: 8 %
Neutro Abs: 2.3 10*3/uL (ref 1.7–7.7)
Neutrophils Relative %: 59 %
Platelet Count: 100 10*3/uL — ABNORMAL LOW (ref 150–400)
RBC: 3.63 MIL/uL — ABNORMAL LOW (ref 3.87–5.11)
RDW: 12.2 % (ref 11.5–15.5)
WBC Count: 3.8 10*3/uL — ABNORMAL LOW (ref 4.0–10.5)
nRBC: 0 % (ref 0.0–0.2)

## 2021-07-15 NOTE — Progress Notes (Signed)
?Hematology and Oncology Follow Up Visit ? ?Runell Kovich Sontag ?[id number] ?20-Jun-1940 81 y.o. ?07/15/2021 ? ? ?Principle Diagnosis:  ?Thrombocytopenia-mild ? ?Current Therapy:   ?Observation ?    ?Interim History:  Ms. Osterberg is back for follow-up.  This is her second office visit.  We first saw her back in February.  At that time, we really cannot find anything that looked suspicious.  She has had no problems with bleeding.  There may be some easy bruising. ? ?Again, she had a bone marrow test done back in January.  This was unremarkable.  I did get the cytogenetics back.  The cytogenetics were normal. ? ?She has had no fever.  She has had no rashes.  There has been no change in bowel or bladder habits.  She has had no nausea or vomiting.  She is eating well.  She had a wonderful Easter with her family. ? ?She stays busy working in the yard. ? ?Overall, I would say performance status is probably ECOG 1. ? ?Medications:  ?Current Outpatient Medications:  ?  acetaminophen (TYLENOL) 500 MG tablet, Take 1,000 mg by mouth in the morning and at bedtime., Disp: , Rfl:  ?  buPROPion (WELLBUTRIN) 100 MG tablet, Take 100 mg by mouth at bedtime., Disp: , Rfl:  ?  clonazePAM (KLONOPIN) 0.5 MG tablet, TAKE 1 TABLET(0.5 MG) BY MOUTH THREE TIMES DAILY AS NEEDED FOR ANXIETY, Disp: 270 tablet, Rfl: 1 ?  escitalopram (LEXAPRO) 10 MG tablet, TAKE 1 TABLET(10 MG) BY MOUTH DAILY, Disp: 90 tablet, Rfl: 1 ?  Multiple Vitamin (MULTIVITAMIN) tablet, Take 1 tablet by mouth daily. Reported on 08/25/2015, Disp: , Rfl:  ?  Propylene Glycol (SYSTANE BALANCE) 0.6 % SOLN, Place 1 drop into both eyes daily., Disp: , Rfl:  ?  simvastatin (ZOCOR) 40 MG tablet, TAKE 1 TABLET(40 MG) BY MOUTH AT BEDTIME, Disp: 90 tablet, Rfl: 1 ?  vitamin C (ASCORBIC ACID) 500 MG tablet, Take 500 mg by mouth daily., Disp: , Rfl:  ? ?Allergies:  ?Allergies  ?Allergen Reactions  ? Tape Other (See Comments)  ?  White adhesive tape -blisters  ? Alendronate Sodium Other (See  Comments)  ?  REACTION: jaw pain  ? Cortizone-10 [Hydrocortisone] Other (See Comments)  ?  Pt received cortizone shot, and became very flushed, and hot, blisters, raw areas around lips & ears   ? Oxycodone-Acetaminophen Hives  ? Penicillins Swelling and Rash  ? ? ?Past Medical History, Surgical history, Social history, and Family History were reviewed and updated. ? ?Review of Systems: ?Review of Systems  ?Constitutional: Negative.   ?HENT:  Negative.    ?Eyes: Negative.   ?Respiratory: Negative.    ?Cardiovascular: Negative.   ?Gastrointestinal: Negative.   ?Endocrine: Negative.   ?Musculoskeletal: Negative.   ?Skin: Negative.   ?Neurological: Negative.   ?Hematological:  Bruises/bleeds easily.  ?Psychiatric/Behavioral: Negative.    ? ?Physical Exam: ? height is 5' (1.524 m) and weight is 143 lb (64.9 kg). Her oral temperature is 98.5 ?F (36.9 ?C). Her blood pressure is 169/63 (abnormal) and her pulse is 68. Her respiration is 16 and oxygen saturation is 100%.  ? ?Wt Readings from Last 3 Encounters:  ?07/15/21 143 lb (64.9 kg)  ?06/10/21 137 lb 4 oz (62.3 kg)  ?04/16/21 127 lb (57.6 kg)  ? ? ?Physical Exam ?Vitals reviewed.  ?HENT:  ?   Head: Normocephalic and atraumatic.  ?Eyes:  ?   Pupils: Pupils are equal, round, and reactive to light.  ?Cardiovascular:  ?  Rate and Rhythm: Normal rate and regular rhythm.  ?   Heart sounds: Normal heart sounds.  ?Pulmonary:  ?   Effort: Pulmonary effort is normal.  ?   Breath sounds: Normal breath sounds.  ?Abdominal:  ?   General: Bowel sounds are normal.  ?   Palpations: Abdomen is soft.  ?Musculoskeletal:     ?   General: No tenderness or deformity. Normal range of motion.  ?   Cervical back: Normal range of motion.  ?Lymphadenopathy:  ?   Cervical: No cervical adenopathy.  ?Skin: ?   General: Skin is warm and dry.  ?   Findings: No erythema or rash.  ?Neurological:  ?   Mental Status: She is alert and oriented to person, place, and time.  ?Psychiatric:     ?   Behavior:  Behavior normal.     ?   Thought Content: Thought content normal.     ?   Judgment: Judgment normal.  ? ? ? ?Lab Results  ?Component Value Date  ? WBC 3.8 (L) 07/15/2021  ? HGB 11.6 (L) 07/15/2021  ? HCT 35.2 (L) 07/15/2021  ? MCV 97.0 07/15/2021  ? PLT 100 (L) 07/15/2021  ? ?  Chemistry   ?   ?Component Value Date/Time  ? NA 141 07/15/2021 1134  ? K 4.6 07/15/2021 1134  ? CL 106 07/15/2021 1134  ? CO2 27 07/15/2021 1134  ? BUN 26 (H) 07/15/2021 1134  ? CREATININE 1.28 (H) 07/15/2021 1134  ? CREATININE 1.06 (H) 02/10/2021 1044  ?    ?Component Value Date/Time  ? CALCIUM 10.0 07/15/2021 1134  ? ALKPHOS 66 07/15/2021 1134  ? AST 52 (H) 07/15/2021 1134  ? ALT 60 (H) 07/15/2021 1134  ? BILITOT 0.5 07/15/2021 1134  ?  ? ? ?Impression and Plan: ?Ms. Quang is a very nice 81 year old white female.  She is the wife of one of our former patients.  Patient has thrombocytopenia.  This is mild.  She has had this chronically.  She had the bone marrow test done which really was unremarkable. ? ?She does have little bit of anemia and thrombocytopenia.  I looked at her bone marrow.  I really do not see anything that looked suspicious on the blood smear. ? ?She could certainly have myelodysplasia.  I think that we probably will have to send off a NGS panel for myelodysplasia when we see her back. ? ?I think we can probably get her back after the summer.  I know that she will be busy.  I do not want to interfere with her life.  I know she enjoys working out in the yard.  She will have a nice garden this year.  I am sure that her family will be visiting her. ? ? ?Volanda Napoleon, MD ?5/4/202312:50 PM  ?

## 2021-07-15 NOTE — Telephone Encounter (Signed)
Per 07/15/21 los - gave upcoming appointments - confirmed ?

## 2021-07-26 ENCOUNTER — Other Ambulatory Visit: Payer: Self-pay | Admitting: Internal Medicine

## 2021-09-13 ENCOUNTER — Encounter: Payer: Self-pay | Admitting: Internal Medicine

## 2021-09-13 ENCOUNTER — Other Ambulatory Visit (HOSPITAL_BASED_OUTPATIENT_CLINIC_OR_DEPARTMENT_OTHER): Payer: Self-pay

## 2021-09-13 ENCOUNTER — Ambulatory Visit: Payer: PPO | Attending: Internal Medicine

## 2021-09-13 ENCOUNTER — Ambulatory Visit (INDEPENDENT_AMBULATORY_CARE_PROVIDER_SITE_OTHER): Payer: PPO | Admitting: Internal Medicine

## 2021-09-13 VITALS — BP 138/82 | HR 73 | Temp 98.2°F | Resp 18 | Ht 60.0 in | Wt 140.0 lb

## 2021-09-13 DIAGNOSIS — Z79899 Other long term (current) drug therapy: Secondary | ICD-10-CM

## 2021-09-13 DIAGNOSIS — R7989 Other specified abnormal findings of blood chemistry: Secondary | ICD-10-CM | POA: Diagnosis not present

## 2021-09-13 DIAGNOSIS — E785 Hyperlipidemia, unspecified: Secondary | ICD-10-CM

## 2021-09-13 DIAGNOSIS — Z23 Encounter for immunization: Secondary | ICD-10-CM

## 2021-09-13 DIAGNOSIS — Z0000 Encounter for general adult medical examination without abnormal findings: Secondary | ICD-10-CM | POA: Diagnosis not present

## 2021-09-13 DIAGNOSIS — F32A Depression, unspecified: Secondary | ICD-10-CM | POA: Diagnosis not present

## 2021-09-13 DIAGNOSIS — F419 Anxiety disorder, unspecified: Secondary | ICD-10-CM | POA: Diagnosis not present

## 2021-09-13 LAB — LIPID PANEL
Cholesterol: 144 mg/dL (ref 0–200)
HDL: 64.8 mg/dL (ref >39.00)
LDL Cholesterol: 61 mg/dL (ref 0–99)
NonHDL: 79.18
Total CHOL/HDL Ratio: 2
Triglycerides: 93 mg/dL (ref 0.0–149.0)
VLDL: 18.6 mg/dL (ref 0.0–40.0)

## 2021-09-13 MED ORDER — SHINGRIX 50 MCG/0.5ML IM SUSR
INTRAMUSCULAR | 0 refills | Status: DC
  Filled 2021-09-13: qty 1, 1d supply, fill #0

## 2021-09-13 MED ORDER — SHINGRIX 50 MCG/0.5ML IM SUSR: 0.5000 mL | Freq: Once | INTRAMUSCULAR | 1 refills | Status: AC

## 2021-09-13 NOTE — Assessment & Plan Note (Signed)
-   Td:  05-2016 - pnm 23: 2008 and 07/2017; prevnar 2015.    - zostavax:  12-2009 - shingrix Rx printed - covid vax: rec booster  - rec flu shot q year ---CCS:   normal  Cscope  2003, Cscope 2-14 no polyps; declines further C-scope which is within guidelines ---PAPs no longer indicated ---Breast ca screening:  (-) FH, MMG 09/2019 per kpn.  Declines further MMGs which is within guidelines -- (+) FH CAD, pt asx, on statins ---Palpable aorta: [place] Ao 03-2015 wnl --Diet and exercise: She remains very active working on her garden  -- POA on file  -- labs:UDS, FLP.  See orders.

## 2021-09-13 NOTE — Patient Instructions (Addendum)
Recommend to proceed with the following vaccines at your pharmacy:  Shingrix (shingles) Covid booster (bivalent)   GO TO THE LAB : Get the blood work     [place], [place] back for   a checkup in 6 months

## 2021-09-13 NOTE — Progress Notes (Signed)
   Covid-19 Vaccination Clinic  Name:  Rebecca [name]    MRN: [id number] DOB: 08-Feb-1941  09/13/2021  [name] was observed post Covid-19 immunization for 15 minutes without incident. She was provided with Vaccine Information Sheet and instruction to access the V-Safe system.   [name] was instructed to call 911 with any severe reactions post vaccine: Difficulty breathing  Swelling of face and throat  A fast heartbeat  A bad rash all over body  Dizziness and weakness   Immunizations Administered     Name Date Dose VIS Date Route   Moderna Covid-19 vaccine Bivalent Booster 09/13/2021  9:59 AM 0.5 mL 10/24/2020 Intramuscular   Manufacturer: Levan Hurst   Lot: [id number]   [place]: [id number]

## 2021-09-13 NOTE — Assessment & Plan Note (Signed)
Here for CPX Hyperlipidemia: On simvastatin.  Check labs. Anxiety depression: Well-controlled on Klonopin and Lexapro, Wellbutrin did not help much, take it sometimes, we agreed to stop it. Pancytopenia: Saw hematology 07/15/2021, they are planning to see her again in few months. Increased LFTs:  No EtOH but takes Tylenol 2 tablets B.I.D..  On chronic statins. We will check a transferrin saturation, SLE,   ANA, anti-smooth muscle antibodies.  With further advise with results Skin lesion: Has a skin lesion on the R leg, probably SK, we agreed that she will reach out to her dermatologist for a visit. RTC 1 year

## 2021-09-13 NOTE — Progress Notes (Signed)
Subjective:    Patient ID: Rebecca [name], female    DOB: 1940/04/08, 81 y.o.   MRN: [id number]  DOS:  09/13/2021 Type of visit - description: CPX  Since the last office visit is doing well. Wellbutrin was not effective, takes it every other day. Has a couple of skin lesions that she is concerned about  Review of Systems  Other than above, a 14 point review of systems is negative     Past Medical History:  Diagnosis Date   Anxiety    Arthritis    degenerative lumbar spine    Chronic neck pain    Closed fracture of base of fifth metatarsal bone 70/0174   Left   Complication of anesthesia    at one time my blood pressure dropped -lumbar - on bp med at that time   Depression    Dysphagia    esophageal stretched 9-14    History of blood transfusion 2009   post - lumbar fusion    Hyperlipidemia    Hypertension    No longer takes med.   Osteoarthritis of left shoulder region    Mild, Dr. Mardelle Matte   Osteoporosis     Past Surgical History:  Procedure Laterality Date   ABDOMINAL HYSTERECTOMY     ANTERIOR CERVICAL DECOMP/DISCECTOMY FUSION N/A 11/20/2014   Procedure: ANTERIOR CERVICAL DECOMPRESSION/DISCECTOMY FUSION 2 LEVELS;  Surgeon: Phylliss Bob, MD;  Location: [facility];  Service: Orthopedics;  Laterality: N/A;  Anterior cervical decompression fusion, cervical 3-4, cervical 4-5 with instrumentation and allograft   arm surgery Bilateral    transposed ulner nerve 10 (dr. Amedeo Plenty)   BACK SURGERY  2009   Dr Jeralyn Bennett SURGERY  01-2014   Dr Saintclair Halsted   carpal tunnell  2006   bilateral   CHOLECYSTECTOMY     KNEE SURGERY  9-11   right,  scope; left knee   NECK SURGERY  05/2008   Dr Saintclair Halsted   OOPHORECTOMY Bilateral    PARTIAL KNEE ARTHROPLASTY  01/17/2012   LEFT KNEE   PARTIAL KNEE ARTHROPLASTY  01/17/2012   Procedure: UNICOMPARTMENTAL KNEE;  Surgeon: Johnny Bridge, MD;  Location: [facility];  Service: Orthopedics;  Laterality: Left;   PARTIAL KNEE ARTHROPLASTY Right 04/21/2020    Procedure: UNICOMPARTMENTAL KNEE;  Surgeon: Marchia Bond, MD;  Location: [facility];  Service: Orthopedics;  Laterality: Right;   RHINOPLASTY  1980   SHOULDER SURGERY Bilateral    x 2/Dr. Collier Salina   TONSILLECTOMY     Social History   Socioeconomic History   Marital status: Widowed    Spouse name: [name].   Number of children: 4   Years of education: Not on file   Highest education level: Not on file  Occupational History   Occupation: stay home   Tobacco Use   Smoking status: Never   Smokeless tobacco: Never  Vaping Use   Vaping Use: Never used  Substance and Sexual Activity   Alcohol use: Not Currently    Comment: rare   Drug use: No   Sexual activity: Not Currently  Other Topics Concern   Not on file  Social History Narrative   Lost husband 2021   Household: pt and son [name]   Children: 3 daughters and 1 son   Social Determinants of Health   Financial Resource Strain: Low Risk  (07/07/2021)   Overall Financial Resource Strain (CARDIA)    Difficulty of Paying Living Expenses: Not hard at all  Food Insecurity: No Food  Insecurity (07/07/2021)   Hunger Vital Sign    Worried About Running Out of Food in the Last Year: Never true    [place] in the Last Year: Never true  Transportation Needs: No Transportation Needs (07/07/2021)   PRAPARE - Hydrologist (Medical): No    Lack of Transportation (Non-Medical): No  Physical Activity: Sufficiently Active (07/07/2021)   Exercise Vital Sign    Days of Exercise per Week: 7 days    Minutes of Exercise per Session: 60 min  Stress: No Stress Concern Present (07/07/2021)   [place]    Feeling of Stress : Not at all  Social Connections: Moderately Integrated (07/07/2021)   Social Connection and Isolation Panel [NHANES]    Frequency of Communication with Friends and Family: More than three times a week    Frequency of Social  Gatherings with Friends and Family: More than three times a week    Attends Religious Services: More than 4 times per year    Active Member of [employer] or Organizations: Yes    Attends Archivist Meetings: More than 4 times per year    Marital Status: Widowed  Intimate Partner Violence: Not At Risk (07/07/2021)   Humiliation, Afraid, Rape, and Kick questionnaire    Fear of Current or Ex-Partner: No    Emotionally Abused: No    Physically Abused: No    Sexually Abused: No    Current Outpatient Medications  Medication Instructions   acetaminophen (TYLENOL) 1,000 mg, Oral, 2 times daily   clonazePAM (KLONOPIN) 0.5 MG tablet TAKE 1 TABLET(0.5 MG) BY MOUTH THREE TIMES DAILY AS NEEDED FOR ANXIETY   escitalopram (LEXAPRO) 10 MG tablet TAKE 1 TABLET(10 MG) BY MOUTH DAILY   Multiple Vitamin (MULTIVITAMIN) tablet 1 tablet, Oral, Daily, Reported on 08/25/2015   Propylene Glycol (SYSTANE BALANCE) 0.6 % SOLN 1 drop, Both Eyes, Daily   simvastatin (ZOCOR) 40 MG tablet TAKE 1 TABLET(40 MG) BY MOUTH AT BEDTIME   vitamin C (ASCORBIC ACID) 500 mg, Oral, Daily   Zoster Vaccine Adjuvanted (SHINGRIX) injection 0.5 mLs, Intramuscular,  Once   Zoster Vaccine Adjuvanted [facility]) injection Intramuscular       Objective:   Physical Exam BP 138/82   Pulse 73   Temp 98.2 F (36.8 C) (Oral)   Resp 18   Ht 5' (1.524 m)   Wt 140 lb (63.5 kg)   SpO2 97%   BMI 27.34 kg/m  General: Well developed, NAD, BMI noted Neck: No  thyromegaly  HEENT:  Normocephalic . Face symmetric, atraumatic Lungs:  CTA B Normal respiratory effort, no intercostal retractions, no accessory muscle use. Heart: RRR,  no murmur.  Abdomen:  Not distended, soft, non-tender. No rebound or rigidity.   Lower extremities: no pretibial edema bilaterally  Skin: Inner aspect of the R knee has bi-color skin lesion, rough in texture, SK ? Neurologic:  alert & oriented X3.  Speech normal, gait appropriate for age and  unassisted Strength symmetric and appropriate for age.  Psych: Cognition and judgment appear intact.  Cooperative with normal attention span and concentration.  Behavior appropriate. No anxious or depressed appearing.     Assessment    Assessment  HTN -- on no med since ~ 2009 Hyperlipidemia Anxiety depression: onset Fall 2016 d/t husband health, lost sister 03-2015, lost husband 2021 GI --GERD --Esophageal stricture stretched 2014  --EGD/2017--->  gastritis, bx---reactive gastropathy, no H. pylori MSK: --DJD;  Chronic neck pain ;  spinal stenosis;  multiple surgeries Osteoporosis DEXA osteoprosis 2006  (per pt) DEXA  11-08 normal   DEXA  9-12 mild osteopenia, rec exercise, ca and vit D T score 05-2015:  (-) 1.4 T score 06/19/2017:  (-) 2.1, RX  vit d, exercise , recheck in 2 years T score -1.8   08/2019 Ao [place] 03-2015 - no AAA Thrombocytopenia, BM Bx 03-2021   PLAN: Here for CPX Hyperlipidemia: On simvastatin.  Check labs. Anxiety depression: Well-controlled on Klonopin and Lexapro, Wellbutrin did not help much, take it sometimes, we agreed to stop it. Pancytopenia: Saw hematology 07/15/2021, they are planning to see her again in few months. Increased LFTs:  No EtOH but takes Tylenol 2 tablets B.I.D..  On chronic statins. We will check a transferrin saturation, SLE,   ANA, anti-smooth muscle antibodies.  With further advise with results Skin lesion: Has a skin lesion on the R leg, probably SK, we agreed that she will reach out to her dermatologist for a visit. RTC 1 year

## 2021-09-16 LAB — TRANSFERRIN SATURATION
IRON SATN MFR SERPL: 27 % Saturation
IRON SERPL-MCNC: 107 ug/dL
TRANSFERRIN SERPL-MCNC: 284 mg/dL

## 2021-09-17 LAB — DRUG MONITORING PANEL 375977 , URINE
Alcohol Metabolites: NEGATIVE ng/mL (ref <500)
Alphahydroxyalprazolam: NEGATIVE ng/mL (ref <25)
Alphahydroxymidazolam: NEGATIVE ng/mL (ref <50)
Alphahydroxytriazolam: NEGATIVE ng/mL (ref <50)
Aminoclonazepam: 498 ng/mL — ABNORMAL HIGH (ref <25)
Amphetamines: NEGATIVE ng/mL (ref <500)
Barbiturates: NEGATIVE ng/mL (ref <300)
Benzodiazepines: POSITIVE ng/mL — AB (ref <100)
Cocaine Metabolite: NEGATIVE ng/mL (ref <150)
Desmethyltramadol: NEGATIVE ng/mL (ref <100)
Hydroxyethylflurazepam: NEGATIVE ng/mL (ref <50)
Lorazepam: NEGATIVE ng/mL (ref <50)
Marijuana Metabolite: NEGATIVE ng/mL (ref <20)
Nordiazepam: NEGATIVE ng/mL (ref <50)
Opiates: NEGATIVE ng/mL (ref <100)
Oxazepam: NEGATIVE ng/mL (ref <50)
Oxycodone: NEGATIVE ng/mL (ref <100)
Temazepam: NEGATIVE ng/mL (ref <50)
Tramadol: NEGATIVE ng/mL (ref <100)

## 2021-09-17 LAB — PROTEIN ELECTROPHORESIS, SERUM
Albumin ELP: 4.1 g/dL (ref 3.8–4.8)
Alpha 1: 0.3 g/dL (ref 0.2–0.3)
Alpha 2: 0.8 g/dL (ref 0.5–0.9)
Beta 2: 0.3 g/dL (ref 0.2–0.5)
Beta Globulin: 0.4 g/dL (ref 0.4–0.6)
Gamma Globulin: 0.9 g/dL (ref 0.8–1.7)
Total Protein: 6.8 g/dL (ref 6.1–8.1)

## 2021-09-17 LAB — ANA: Anti Nuclear Antibody (ANA): NEGATIVE

## 2021-09-17 LAB — DM TEMPLATE

## 2021-09-17 LAB — ANTI-SMOOTH MUSCLE ANTIBODY, IGG: Actin (Smooth Muscle) Antibody (IGG): <20 U (ref <20)

## 2021-09-27 DIAGNOSIS — H40033 Anatomical narrow angle, bilateral: Secondary | ICD-10-CM | POA: Diagnosis not present

## 2021-09-27 DIAGNOSIS — H43393 Other vitreous opacities, bilateral: Secondary | ICD-10-CM | POA: Diagnosis not present

## 2021-09-28 ENCOUNTER — Other Ambulatory Visit (HOSPITAL_BASED_OUTPATIENT_CLINIC_OR_DEPARTMENT_OTHER): Payer: Self-pay

## 2021-09-28 MED ORDER — COVID-19 MRNA VACC (MODERNA) 100 MCG/0.5ML IM SUSP
INTRAMUSCULAR | 0 refills | Status: DC
  Filled 2021-09-28: qty 0.5, 1d supply, fill #0

## 2021-09-28 MED ORDER — MODERNA COVID-19 BIVALENT 50 MCG/0.5ML IM SUSP
INTRAMUSCULAR | 0 refills | Status: DC
Start: 2021-09-13 — End: 2021-11-18
  Filled 2021-09-28 (×2): qty 0.5, 1d supply, fill #0

## 2021-09-29 DIAGNOSIS — L821 Other seborrheic keratosis: Secondary | ICD-10-CM | POA: Diagnosis not present

## 2021-09-29 DIAGNOSIS — L820 Inflamed seborrheic keratosis: Secondary | ICD-10-CM | POA: Diagnosis not present

## 2021-10-10 ENCOUNTER — Telehealth: Payer: Self-pay | Admitting: Internal Medicine

## 2021-10-11 NOTE — Telephone Encounter (Signed)
PDMP okay, Rx sent 

## 2021-10-11 NOTE — Telephone Encounter (Signed)
Requesting: clonazepam 0.'5mg'$   Contract: 09/10/20 UDS: 09/13/21 Last Visit: 09/13/21 Next Visit: 03/23/22 Last Refill: 11/04/20 #270 and 1RF  Please Advise

## 2021-10-28 ENCOUNTER — Other Ambulatory Visit: Payer: Self-pay | Admitting: Internal Medicine

## 2021-10-29 ENCOUNTER — Telehealth: Payer: Self-pay | Admitting: Internal Medicine

## 2021-10-29 NOTE — Telephone Encounter (Signed)
Tried calling Pt- phone just rang, no answer, unable to leave message.

## 2021-10-29 NOTE — Telephone Encounter (Signed)
Here for CPX Hyperlipidemia: On simvastatin.  Check labs. Anxiety depression: Well-controlled on Klonopin and Lexapro, Wellbutrin did not help much, take it sometimes, we agreed to stop it. Pancytopenia: Saw hematology 07/15/2021, they are planning to see her again in few months. Increased LFTs:  No EtOH but takes Tylenol 2 tablets B.I.D..  On chronic statins. We will check a transferrin saturation, SLE,   ANA, anti-smooth muscle antibodies.  With further advise with results Skin lesion: Has a skin lesion on the R leg, probably SK, we agreed that she will reach out to her dermatologist for a visit. RTC 1 year

## 2021-10-29 NOTE — Telephone Encounter (Signed)
Pt called stating that she wanted to let Dr. Larose Kells know that she is taking two of the bupropion instead of one and when it was time for a new Rx, could Dr. Larose Kells write it for two a day instead of one.

## 2021-11-01 NOTE — Telephone Encounter (Signed)
Tried calling again, no answer, unable to leave voice message. Closing note.

## 2021-11-04 ENCOUNTER — Other Ambulatory Visit: Payer: Self-pay | Admitting: Internal Medicine

## 2021-11-04 NOTE — Telephone Encounter (Signed)
Spoke w/ Pt- informed looks like per last OV note w/ PCP they agreed to stop Wellbutrin as it wasn't helping much, Pt doesn't recall this but feels it does help, she is taking it twice daily. Pt aware that PCP out of office until 9/5-she has enough meds to last until he returns (she didn't want message sent to covering provider).

## 2021-11-16 MED ORDER — BUPROPION HCL 100 MG PO TABS: 100.0000 mg | ORAL_TABLET | Freq: Two times a day (BID) | ORAL | 3 refills | Status: DC

## 2021-11-16 NOTE — Telephone Encounter (Signed)
Rx sent 

## 2021-11-16 NOTE — Addendum Note (Signed)
Addended byDamita Dunnings D on: 11/16/2021 12:42 PM   Modules accepted: Orders

## 2021-11-16 NOTE — Telephone Encounter (Signed)
If it is helping, okay to continue.  Prescription is ready to be send

## 2021-11-16 NOTE — Addendum Note (Signed)
Addended by: Kathlene November E on: 11/16/2021 12:41 PM   Modules accepted: Orders

## 2021-11-17 ENCOUNTER — Telehealth: Payer: Self-pay | Admitting: Internal Medicine

## 2021-11-17 MED ORDER — BUPROPION HCL 100 MG PO TABS: 100.0000 mg | ORAL_TABLET | Freq: Two times a day (BID) | ORAL | 0 refills | Status: DC

## 2021-11-17 NOTE — Telephone Encounter (Signed)
Pt states she thought she was supposed take wellbutrin twice daily but thought it was going to be a 90 day supply and does not have enough to take rx for 90 days twice a day. Please advise pt of instructions.

## 2021-11-17 NOTE — Telephone Encounter (Signed)
Rx re-sent as 90 day supply. Total 180 tablets.

## 2021-11-18 ENCOUNTER — Encounter: Payer: Self-pay | Admitting: Hematology & Oncology

## 2021-11-18 ENCOUNTER — Inpatient Hospital Stay: Payer: PPO | Attending: Hematology & Oncology

## 2021-11-18 ENCOUNTER — Other Ambulatory Visit (HOSPITAL_BASED_OUTPATIENT_CLINIC_OR_DEPARTMENT_OTHER): Payer: Self-pay

## 2021-11-18 ENCOUNTER — Inpatient Hospital Stay: Payer: PPO | Admitting: Hematology & Oncology

## 2021-11-18 ENCOUNTER — Other Ambulatory Visit: Payer: Self-pay

## 2021-11-18 VITALS — BP 132/63 | HR 72 | Temp 97.7°F | Resp 18 | Ht 60.0 in | Wt 142.8 lb

## 2021-11-18 DIAGNOSIS — D696 Thrombocytopenia, unspecified: Secondary | ICD-10-CM | POA: Diagnosis not present

## 2021-11-18 LAB — CMP (CANCER CENTER ONLY)
ALT: 33 U/L (ref 0–44)
AST: 31 U/L (ref 15–41)
Albumin: 4.2 g/dL (ref 3.5–5.0)
Alkaline Phosphatase: 49 U/L (ref 38–126)
Anion gap: 7 (ref 5–15)
BUN: 29 mg/dL — ABNORMAL HIGH (ref 8–23)
CO2: 25 mmol/L (ref 22–32)
Calcium: 9.7 mg/dL (ref 8.9–10.3)
Chloride: 108 mmol/L (ref 98–111)
Creatinine: 1.44 mg/dL — ABNORMAL HIGH (ref 0.44–1.00)
GFR, Estimated: 37 mL/min — ABNORMAL LOW (ref >60)
Glucose, Bld: 96 mg/dL (ref 70–99)
Potassium: 4.3 mmol/L (ref 3.5–5.1)
Sodium: 140 mmol/L (ref 135–145)
Total Bilirubin: 0.6 mg/dL (ref 0.3–1.2)
Total Protein: 6.5 g/dL (ref 6.5–8.1)

## 2021-11-18 LAB — CBC WITH DIFFERENTIAL (CANCER CENTER ONLY)
Abs Immature Granulocytes: 0.02 10*3/uL (ref 0.00–0.07)
Basophils Absolute: 0 10*3/uL (ref 0.0–0.1)
Basophils Relative: 1 %
Eosinophils Absolute: 0.1 10*3/uL (ref 0.0–0.5)
Eosinophils Relative: 2 %
HCT: 35.7 % — ABNORMAL LOW (ref 36.0–46.0)
Hemoglobin: 11.6 g/dL — ABNORMAL LOW (ref 12.0–15.0)
Immature Granulocytes: 1 %
Lymphocytes Relative: 29 %
Lymphs Abs: 1.3 10*3/uL (ref 0.7–4.0)
MCH: 32 pg (ref 26.0–34.0)
MCHC: 32.5 g/dL (ref 30.0–36.0)
MCV: 98.3 fL (ref 80.0–100.0)
Monocytes Absolute: 0.3 10*3/uL (ref 0.1–1.0)
Monocytes Relative: 7 %
Neutro Abs: 2.6 10*3/uL (ref 1.7–7.7)
Neutrophils Relative %: 60 %
Platelet Count: 101 10*3/uL — ABNORMAL LOW (ref 150–400)
RBC: 3.63 MIL/uL — ABNORMAL LOW (ref 3.87–5.11)
RDW: 12.8 % (ref 11.5–15.5)
WBC Count: 4.3 10*3/uL (ref 4.0–10.5)
nRBC: 0 % (ref 0.0–0.2)

## 2021-11-18 LAB — SAVE SMEAR(SSMR), FOR PROVIDER SLIDE REVIEW

## 2021-11-18 MED ORDER — SHINGRIX 50 MCG/0.5ML IM SUSR
INTRAMUSCULAR | 0 refills | Status: DC
Start: 2021-11-18 — End: 2022-03-23
  Filled 2021-11-18: qty 0.5, 1d supply, fill #0

## 2021-11-18 NOTE — Progress Notes (Signed)
Hematology and Oncology Follow Up Visit  Rebecca [name] [facility] [id number] 11-Mar-1941 81 y.o. 11/18/2021   Principle Diagnosis:  Thrombocytopenia-mild  Current Therapy:   Observation     Interim History:  Ms. Rengel is back for follow-up.  We last saw her back in May.  Since then, she has had a pretty decent summer.  She is try to stay busy.  Her health is doing pretty well.  She has had no problems with bleeding or bruising.  She has had no problems with her appetite.  Her weight is holding steady.  There is been no change in bowel or bladder habits.  Overall, I would say performance status is probably ECOG 1.     Medications:  Current Outpatient Medications:    acetaminophen (TYLENOL) 500 MG tablet, Take 1,000 mg by mouth in the morning and at bedtime., Disp: , Rfl:    buPROPion (WELLBUTRIN) 100 MG tablet, Take 1 tablet (100 mg total) by mouth 2 (two) times daily., Disp: 180 tablet, Rfl: 0   clonazePAM (KLONOPIN) 0.5 MG tablet, TAKE 1 TABLET(0.5 MG) BY MOUTH THREE TIMES DAILY AS NEEDED FOR ANXIETY, Disp: 270 tablet, Rfl: 1   escitalopram (LEXAPRO) 10 MG tablet, Take 1 tablet (10 mg total) by mouth daily., Disp: 90 tablet, Rfl: 1   Multiple Vitamin (MULTIVITAMIN) tablet, Take 1 tablet by mouth daily. Reported on 08/25/2015, Disp: , Rfl:    Propylene Glycol (SYSTANE BALANCE) 0.6 % SOLN, Place 1 drop into both eyes daily., Disp: , Rfl:    simvastatin (ZOCOR) 40 MG tablet, TAKE 1 TABLET(40 MG) BY MOUTH AT BEDTIME, Disp: 90 tablet, Rfl: 1   vitamin C (ASCORBIC ACID) 500 MG tablet, Take 500 mg by mouth daily., Disp: , Rfl:   Allergies:  Allergies  Allergen Reactions   Tape Other (See Comments)    White adhesive tape -blisters   Alendronate Sodium Other (See Comments)    REACTION: jaw pain   Cortizone-10 [Hydrocortisone] Other (See Comments)    Pt received cortizone shot, and became very flushed, and hot, blisters, raw areas around lips & ears    Oxycodone-Acetaminophen Hives    Penicillins Swelling and Rash    Past Medical History, Surgical history, Social history, and Family History were reviewed and updated.  Review of Systems: Review of Systems  Constitutional: Negative.   HENT:  Negative.    Eyes: Negative.   Respiratory: Negative.    Cardiovascular: Negative.   Gastrointestinal: Negative.   Endocrine: Negative.   Musculoskeletal: Negative.   Skin: Negative.   Neurological: Negative.   Hematological:  Bruises/bleeds easily.  Psychiatric/Behavioral: Negative.      Physical Exam:  height is 5' (1.524 m) and weight is 142 lb 12.8 oz (64.8 kg). Her oral temperature is 97.7 F (36.5 C). Her blood pressure is 132/63 and her pulse is 72. Her respiration is 18 and oxygen saturation is 100%.   Wt Readings from Last 3 Encounters:  11/18/21 142 lb 12.8 oz (64.8 kg)  09/13/21 140 lb (63.5 kg)  07/15/21 143 lb (64.9 kg)    Physical Exam Vitals reviewed.  HENT:     Head: Normocephalic and atraumatic.  Eyes:     Pupils: Pupils are equal, round, and reactive to light.  Cardiovascular:     Rate and Rhythm: Normal rate and regular rhythm.     Heart sounds: Normal heart sounds.  Pulmonary:     Effort: Pulmonary effort is normal.     Breath sounds: Normal breath sounds.  Abdominal:  General: Bowel sounds are normal.     Palpations: Abdomen is soft.  Musculoskeletal:        General: No tenderness or deformity. Normal range of motion.     Cervical back: Normal range of motion.  Lymphadenopathy:     Cervical: No cervical adenopathy.  Skin:    General: Skin is warm and dry.     Findings: No erythema or rash.  Neurological:     Mental Status: She is alert and oriented to person, place, and time.  Psychiatric:        Behavior: Behavior normal.        Thought Content: Thought content normal.        Judgment: Judgment normal.      Lab Results  Component Value Date   WBC 4.3 11/18/2021   HGB 11.6 (L) 11/18/2021   HCT 35.7 (L) 11/18/2021   MCV  98.3 11/18/2021   PLT 101 (L) 11/18/2021     Chemistry      Component Value Date/Time   NA 140 11/18/2021 1153   K 4.3 11/18/2021 1153   CL 108 11/18/2021 1153   CO2 25 11/18/2021 1153   BUN 29 (H) 11/18/2021 1153   CREATININE 1.44 (H) 11/18/2021 1153   CREATININE 1.06 (H) 02/10/2021 1044      Component Value Date/Time   CALCIUM 9.7 11/18/2021 1153   ALKPHOS 49 11/18/2021 1153   AST 31 11/18/2021 1153   ALT 33 11/18/2021 1153   BILITOT 0.6 11/18/2021 1153      Impression and Plan: Ms. Domenico is a very nice 81 year old white female.  She is the wife of one of our former patients.  She has thrombocytopenia.  This is mild.  She has had this chronically.  She had the bone marrow test done which really was unremarkable.    I had believe that we probably are looking at an element of myelodysplasia.  However, I really do not see that there is any way or reason that we have to treat her for this.  Her platelet count is holding steady.  I looked at her blood smear under the microscope.  I do not see anything that looked significant.  I do not see any activity that looked suspicious for any type of progressive disease.  For right now, we will plan to get her back in 6 months.   Volanda Napoleon, MD 9/7/202312:50 PM

## 2021-11-24 LAB — MYELODYSPLASTIC SYNDROME (MDS) PANEL

## 2021-11-29 ENCOUNTER — Telehealth: Payer: Self-pay | Admitting: Internal Medicine

## 2021-11-29 MED ORDER — BUPROPION HCL 100 MG PO TABS: 100.0000 mg | ORAL_TABLET | Freq: Two times a day (BID) | ORAL | 0 refills | Status: DC

## 2021-11-29 NOTE — Telephone Encounter (Signed)
Rx re-sent w/ needed information. 

## 2021-11-29 NOTE — Telephone Encounter (Signed)
Pt states pharmacy/ins is saying it needs to say change in prescription for ins to cover new rx frequency for buPROPion (WELLBUTRIN) 100 MG tablet.

## 2022-01-31 ENCOUNTER — Other Ambulatory Visit: Payer: Self-pay | Admitting: Internal Medicine

## 2022-03-23 ENCOUNTER — Encounter: Payer: Self-pay | Admitting: Internal Medicine

## 2022-03-23 ENCOUNTER — Ambulatory Visit (INDEPENDENT_AMBULATORY_CARE_PROVIDER_SITE_OTHER): Payer: PPO | Admitting: Internal Medicine

## 2022-03-23 VITALS — BP 134/84 | HR 71 | Temp 97.9°F | Resp 16 | Ht 60.0 in | Wt 152.1 lb

## 2022-03-23 DIAGNOSIS — E785 Hyperlipidemia, unspecified: Secondary | ICD-10-CM

## 2022-03-23 DIAGNOSIS — F419 Anxiety disorder, unspecified: Secondary | ICD-10-CM | POA: Diagnosis not present

## 2022-03-23 DIAGNOSIS — M7541 Impingement syndrome of right shoulder: Secondary | ICD-10-CM | POA: Diagnosis not present

## 2022-03-23 DIAGNOSIS — D696 Thrombocytopenia, unspecified: Secondary | ICD-10-CM | POA: Diagnosis not present

## 2022-03-23 DIAGNOSIS — M1811 Unilateral primary osteoarthritis of first carpometacarpal joint, right hand: Secondary | ICD-10-CM | POA: Diagnosis not present

## 2022-03-23 DIAGNOSIS — M25521 Pain in right elbow: Secondary | ICD-10-CM | POA: Diagnosis not present

## 2022-03-23 DIAGNOSIS — R7989 Other specified abnormal findings of blood chemistry: Secondary | ICD-10-CM

## 2022-03-23 DIAGNOSIS — F32A Depression, unspecified: Secondary | ICD-10-CM | POA: Diagnosis not present

## 2022-03-23 NOTE — Patient Instructions (Addendum)
Vaccines I recommend:  Covid booster RSV vaccine    GO TO THE FRONT DESK, PLEASE SCHEDULE YOUR APPOINTMENTS Come back for   a physical exam by July 2024

## 2022-03-23 NOTE — Assessment & Plan Note (Addendum)
Hyperlipidemia: Last FLP satisfactory.  No change Anxiety depression:Good compliance with Wellbutrin, escitalopram and clonazepam.  She also takes melatonin at night.  She is still grieving lost of her husband but overall feeling well emotionally.  Listening therapy provided. Increased LFTs: See LOV, LFTs normalize and additional blood work came back negative.  Still on the statins and Tylenol twice daily.  Recheck on RTC. MSK: Has developed right shoulder pain, with some radiation to the arm, to see Ortho today. Thrombocytopenia, last visit with hematology 11/18/2021. Preventive care: Had a shingles shot and a flu shot, last COVID-vaccine July 2023, booster is an option. RTC 09-2022 CPX

## 2022-03-23 NOTE — Progress Notes (Signed)
Subjective:    Patient ID: Rebecca [name], female    DOB: [birth year], 82 y.o.   MRN: [id number]  DOS:  03/23/2022 Type of visit - description: Routine checkup  Since the last office visit is doing well. Saw hematology, notes reviewed. Emotionally doing well.   Review of Systems See above   Past Medical History:  Diagnosis Date   Anxiety    Arthritis    degenerative lumbar spine    Chronic neck pain    Closed fracture of base of fifth metatarsal bone 78/6767   Left   Complication of anesthesia    at one time my blood pressure dropped -lumbar - on bp med at that time   Depression    Dysphagia    esophageal stretched 9-14    History of blood transfusion 2009   post - lumbar fusion    Hyperlipidemia    Hypertension    No longer takes med.   Osteoarthritis of left shoulder region    Mild, Dr. Mardelle Matte   Osteoporosis     Past Surgical History:  Procedure Laterality Date   ABDOMINAL HYSTERECTOMY     ANTERIOR CERVICAL DECOMP/DISCECTOMY FUSION N/A 11/20/2014   Procedure: ANTERIOR CERVICAL DECOMPRESSION/DISCECTOMY FUSION 2 LEVELS;  Surgeon: Phylliss Bob, MD;  Location: [facility];  Service: Orthopedics;  Laterality: N/A;  Anterior cervical decompression fusion, cervical 3-4, cervical 4-5 with instrumentation and allograft   arm surgery Bilateral    transposed ulner nerve 10 (dr. Amedeo Plenty)   BACK SURGERY  2009   Dr Jeralyn Bennett SURGERY  01-2014   Dr Saintclair Halsted   carpal tunnell  2006   bilateral   CHOLECYSTECTOMY     KNEE SURGERY  9-11   right,  scope; left knee   NECK SURGERY  05/2008   Dr Saintclair Halsted   OOPHORECTOMY Bilateral    PARTIAL KNEE ARTHROPLASTY  01/17/2012   LEFT KNEE   PARTIAL KNEE ARTHROPLASTY  01/17/2012   Procedure: UNICOMPARTMENTAL KNEE;  Surgeon: Johnny Bridge, MD;  Location: [facility];  Service: Orthopedics;  Laterality: Left;   PARTIAL KNEE ARTHROPLASTY Right 04/21/2020   Procedure: UNICOMPARTMENTAL KNEE;  Surgeon: Marchia Bond, MD;  Location: [facility];  Service:  Orthopedics;  Laterality: Right;   RHINOPLASTY  1980   SHOULDER SURGERY Bilateral    x 2/Dr. Collier Salina   TONSILLECTOMY      Current Outpatient Medications  Medication Instructions   acetaminophen (TYLENOL) 1,000 mg, Oral, 2 times daily   ascorbic acid (VITAMIN C) 500 mg, Oral, Daily   buPROPion (WELLBUTRIN) 100 mg, Oral, 2 times daily   clonazePAM (KLONOPIN) 0.5 MG tablet TAKE 1 TABLET(0.5 MG) BY MOUTH THREE TIMES DAILY AS NEEDED FOR ANXIETY   escitalopram (LEXAPRO) 10 mg, Oral, Daily   melatonin 5 mg, Oral   Multiple Vitamin (MULTIVITAMIN) tablet 1 tablet, Oral, Daily, Reported on 08/25/2015   Propylene Glycol (SYSTANE BALANCE) 0.6 % SOLN 1 drop, Both Eyes, Daily   simvastatin (ZOCOR) 40 MG tablet TAKE 1 TABLET(40 MG) BY MOUTH AT BEDTIME       Objective:   Physical Exam BP 134/84   Pulse 71   Temp 97.9 F (36.6 C) (Oral)   Resp 16   Ht 5' (1.524 m)   Wt 152 lb 2 oz (69 kg)   SpO2 98%   BMI 29.71 kg/m  General:   Well developed, NAD, BMI noted. HEENT:  Normocephalic . Face symmetric, atraumatic Lungs:  CTA B Normal respiratory effort, no intercostal retractions, no  accessory muscle use. Heart: RRR,  no murmur.  Lower extremities: no pretibial edema bilaterally  Skin: Not pale. Not jaundice Neurologic:  alert & oriented X3.  Speech normal, gait appropriate for age and unassisted Psych--  Cognition and judgment appear intact.  Cooperative with normal attention span and concentration.  Behavior appropriate. No anxious or depressed appearing.      Assessment     Assessment  HTN -- on no med since ~ 2009 Hyperlipidemia Anxiety depression: onset Fall 2016 d/t husband health, lost sister 03-2015, lost husband 2021 GI --GERD --Esophageal stricture stretched 2014  --EGD/2017--->  gastritis, bx---reactive gastropathy, no H. pylori. Increased LFTs: 03-2021: [place] FAtty liver and  hep serology (-); 09-2021 labs neg (  transferrin saturation, SLE,   ANA, anti-smooth muscle  antibodies ) MSK: --DJD; Chronic neck pain ;  spinal stenosis;  multiple surgeries Osteoporosis DEXA osteoprosis 2006  (per pt) DEXA  11-08 normal   DEXA  9-12 mild osteopenia, rec exercise, ca and vit D T score 05-2015:  (-) 1.4 T score 06/19/2017:  (-) 2.1, RX  vit d, exercise , recheck in 2 years T score -1.8   08/2019 Ao [place] 03-2015 - no AAA Thrombocytopenia, BM Bx 03-2021   PLAN: Hyperlipidemia: Last FLP satisfactory.  No change Anxiety depression:Good compliance with Wellbutrin, escitalopram and clonazepam.  She also takes melatonin at night.  She is still grieving lost of her husband but overall feeling well emotionally.  Listening therapy provided. Increased LFTs: See LOV, LFTs normalize and additional blood work came back negative.  Still on the statins and Tylenol twice daily.  Recheck on RTC. MSK: Has developed right shoulder pain, with some radiation to the arm, to see Ortho today. Thrombocytopenia, last visit with hematology 11/18/2021. Preventive care: Had a shingles shot and a flu shot, last COVID-vaccine July 2023, booster is an option. RTC 09-2022 CPX

## 2022-05-10 ENCOUNTER — Other Ambulatory Visit: Payer: Self-pay | Admitting: Family

## 2022-05-12 ENCOUNTER — Other Ambulatory Visit: Payer: Self-pay

## 2022-05-12 MED ORDER — SIMVASTATIN 40 MG PO TABS: ORAL_TABLET | ORAL | 1 refills | Status: DC

## 2022-05-19 ENCOUNTER — Inpatient Hospital Stay: Payer: PPO | Attending: Hematology & Oncology

## 2022-05-19 ENCOUNTER — Other Ambulatory Visit: Payer: Self-pay

## 2022-05-19 ENCOUNTER — Inpatient Hospital Stay (HOSPITAL_BASED_OUTPATIENT_CLINIC_OR_DEPARTMENT_OTHER): Payer: PPO | Admitting: Hematology & Oncology

## 2022-05-19 ENCOUNTER — Encounter: Payer: Self-pay | Admitting: Hematology & Oncology

## 2022-05-19 VITALS — BP 137/65 | HR 69 | Temp 97.8°F | Resp 18 | Ht 60.0 in | Wt 153.0 lb

## 2022-05-19 DIAGNOSIS — D696 Thrombocytopenia, unspecified: Secondary | ICD-10-CM

## 2022-05-19 LAB — CMP (CANCER CENTER ONLY)
ALT: 25 U/L (ref 0–44)
AST: 29 U/L (ref 15–41)
Albumin: 4.2 g/dL (ref 3.5–5.0)
Alkaline Phosphatase: 51 U/L (ref 38–126)
Anion gap: 9 (ref 5–15)
BUN: 25 mg/dL — ABNORMAL HIGH (ref 8–23)
CO2: 27 mmol/L (ref 22–32)
Calcium: 9.7 mg/dL (ref 8.9–10.3)
Chloride: 107 mmol/L (ref 98–111)
Creatinine: 1.26 mg/dL — ABNORMAL HIGH (ref 0.44–1.00)
GFR, Estimated: 43 mL/min — ABNORMAL LOW (ref >60)
Glucose, Bld: 91 mg/dL (ref 70–99)
Potassium: 4.3 mmol/L (ref 3.5–5.1)
Sodium: 143 mmol/L (ref 135–145)
Total Bilirubin: 0.8 mg/dL (ref 0.3–1.2)
Total Protein: 6.9 g/dL (ref 6.5–8.1)

## 2022-05-19 LAB — CBC WITH DIFFERENTIAL (CANCER CENTER ONLY)
Abs Immature Granulocytes: 0.01 10*3/uL (ref 0.00–0.07)
Basophils Absolute: 0 10*3/uL (ref 0.0–0.1)
Basophils Relative: 1 %
Eosinophils Absolute: 0.2 10*3/uL (ref 0.0–0.5)
Eosinophils Relative: 5 %
HCT: 36.8 % (ref 36.0–46.0)
Hemoglobin: 12 g/dL (ref 12.0–15.0)
Immature Granulocytes: 0 %
Lymphocytes Relative: 29 %
Lymphs Abs: 1.1 10*3/uL (ref 0.7–4.0)
MCH: 31.4 pg (ref 26.0–34.0)
MCHC: 32.6 g/dL (ref 30.0–36.0)
MCV: 96.3 fL (ref 80.0–100.0)
Monocytes Absolute: 0.3 10*3/uL (ref 0.1–1.0)
Monocytes Relative: 9 %
Neutro Abs: 2.1 10*3/uL (ref 1.7–7.7)
Neutrophils Relative %: 56 %
Platelet Count: 106 10*3/uL — ABNORMAL LOW (ref 150–400)
RBC: 3.82 MIL/uL — ABNORMAL LOW (ref 3.87–5.11)
RDW: 12.8 % (ref 11.5–15.5)
WBC Count: 3.8 10*3/uL — ABNORMAL LOW (ref 4.0–10.5)
nRBC: 0 % (ref 0.0–0.2)

## 2022-05-19 LAB — SAVE SMEAR(SSMR), FOR PROVIDER SLIDE REVIEW

## 2022-05-19 NOTE — Progress Notes (Signed)
Hematology and Oncology Follow Up Visit  Jasa Bucek [facility] [id number] May 06, 1940 82 y.o. 05/19/2022   Principle Diagnosis:  Thrombocytopenia-mild  Current Therapy:   Observation     Interim History:  Ms. Villaverde is back for follow-up.  We saw her 6 months ago.  She is doing pretty well.  She really has had no specific complaints.  There is no problems with bleeding or bruising.  She is eating well.  She has had no problems with COVID over the holidays.  She has had no leg swelling.  There is been no rashes.  She has had no fever.  There is been no headache.  Overall, I would say her performance status is probably ECOG 1.     Medications:  Current Outpatient Medications:    acetaminophen (TYLENOL) 500 MG tablet, Take 1,000 mg by mouth in the morning and at bedtime., Disp: , Rfl:    buPROPion (WELLBUTRIN) 100 MG tablet, Take 1 tablet (100 mg total) by mouth 2 (two) times daily., Disp: 180 tablet, Rfl: 0   clonazePAM (KLONOPIN) 0.5 MG tablet, TAKE 1 TABLET(0.5 MG) BY MOUTH THREE TIMES DAILY AS NEEDED FOR ANXIETY, Disp: 270 tablet, Rfl: 1   escitalopram (LEXAPRO) 10 MG tablet, Take 1 tablet (10 mg total) by mouth daily., Disp: 90 tablet, Rfl: 1   melatonin 5 MG TABS, Take 5 mg by mouth., Disp: , Rfl:    Multiple Vitamin (MULTIVITAMIN) tablet, Take 1 tablet by mouth daily. Reported on 08/25/2015, Disp: , Rfl:    Propylene Glycol (SYSTANE BALANCE) 0.6 % SOLN, Place 1 drop into both eyes daily., Disp: , Rfl:    simvastatin (ZOCOR) 40 MG tablet, TAKE 1 TABLET(40 MG) BY MOUTH AT BEDTIME, Disp: 90 tablet, Rfl: 1   vitamin C (ASCORBIC ACID) 500 MG tablet, Take 500 mg by mouth daily., Disp: , Rfl:   Allergies:  Allergies  Allergen Reactions   Tape Other (See Comments)    White adhesive tape -blisters   Alendronate Sodium Other (See Comments)    REACTION: jaw pain   Cortizone-10 [Hydrocortisone] Other (See Comments)    Pt received cortizone shot, and became very flushed, and hot, blisters,  raw areas around lips & ears    Oxycodone-Acetaminophen Hives   Penicillins Swelling and Rash    Past Medical History, Surgical history, Social history, and Family History were reviewed and updated.  Review of Systems: Review of Systems  Constitutional: Negative.   HENT:  Negative.    Eyes: Negative.   Respiratory: Negative.    Cardiovascular: Negative.   Gastrointestinal: Negative.   Endocrine: Negative.   Musculoskeletal: Negative.   Skin: Negative.   Neurological: Negative.   Hematological:  Bruises/bleeds easily.  Psychiatric/Behavioral: Negative.      Physical Exam:  height is 5' (1.524 m) and weight is 153 lb (69.4 kg). Her oral temperature is 97.8 F (36.6 C). Her blood pressure is 137/65 and her pulse is 69. Her respiration is 18 and oxygen saturation is 100%.   Wt Readings from Last 3 Encounters:  05/19/22 153 lb (69.4 kg)  03/23/22 152 lb 2 oz (69 kg)  11/18/21 142 lb 12.8 oz (64.8 kg)    Physical Exam Vitals reviewed.  HENT:     Head: Normocephalic and atraumatic.  Eyes:     Pupils: Pupils are equal, round, and reactive to light.  Cardiovascular:     Rate and Rhythm: Normal rate and regular rhythm.     Heart sounds: Normal heart sounds.  Pulmonary:  Effort: Pulmonary effort is normal.     Breath sounds: Normal breath sounds.  Abdominal:     General: Bowel sounds are normal.     Palpations: Abdomen is soft.  Musculoskeletal:        General: No tenderness or deformity. Normal range of motion.     Cervical back: Normal range of motion.  Lymphadenopathy:     Cervical: No cervical adenopathy.  Skin:    General: Skin is warm and dry.     Findings: No erythema or rash.  Neurological:     Mental Status: She is alert and oriented to person, place, and time.  Psychiatric:        Behavior: Behavior normal.        Thought Content: Thought content normal.        Judgment: Judgment normal.     Lab Results  Component Value Date   WBC 3.8 (L)  05/19/2022   HGB 12.0 05/19/2022   HCT 36.8 05/19/2022   MCV 96.3 05/19/2022   PLT 106 (L) 05/19/2022     Chemistry      Component Value Date/Time   NA 140 11/18/2021 1153   K 4.3 11/18/2021 1153   CL 108 11/18/2021 1153   CO2 25 11/18/2021 1153   BUN 29 (H) 11/18/2021 1153   CREATININE 1.44 (H) 11/18/2021 1153   CREATININE 1.06 (H) 02/10/2021 1044      Component Value Date/Time   CALCIUM 9.7 11/18/2021 1153   ALKPHOS 49 11/18/2021 1153   AST 31 11/18/2021 1153   ALT 33 11/18/2021 1153   BILITOT 0.6 11/18/2021 1153      Impression and Plan: Ms. Mongold is a very nice 82 year old white female.  She is the wife of one of our former patients.  She has thrombocytopenia.  This is mild.  She has had this chronically.  She had the bone marrow test done which really was unremarkable.    I have to believe that we probably are looking at an element of myelodysplasia.  However, I really do not see that there is any way or reason that we have to treat her for this.  Her platelet count is essentially holding steady.  Her hemoglobin is a little bit better.  I know her white cell count is down a little bit but stable when compared to about a year ago.    I looked at her blood smear under the microscope.  I do not see anything that looked significant.  I do not see any activity that looked suspicious for any type of progressive disease.  For right now, I still think that we can get her back in 6 months.   Volanda Napoleon, MD 3/7/20249:07 AM

## 2022-05-25 ENCOUNTER — Telehealth: Payer: Self-pay | Admitting: Internal Medicine

## 2022-05-25 NOTE — Telephone Encounter (Signed)
Requesting: clonazepam 0.'5mg'$   Contract: 03/23/22 UDS: 09/13/2021 Last Visit: 03/23/22 Next Visit: 09/21/22 Last Refill: 10/11/21 #270 and 1RF   Please Advise

## 2022-05-26 NOTE — Telephone Encounter (Signed)
PDMP okay, sent

## 2022-06-20 DIAGNOSIS — M25521 Pain in right elbow: Secondary | ICD-10-CM | POA: Diagnosis not present

## 2022-06-20 DIAGNOSIS — M1811 Unilateral primary osteoarthritis of first carpometacarpal joint, right hand: Secondary | ICD-10-CM | POA: Diagnosis not present

## 2022-06-20 DIAGNOSIS — M7541 Impingement syndrome of right shoulder: Secondary | ICD-10-CM | POA: Diagnosis not present

## 2022-06-28 ENCOUNTER — Telehealth: Payer: Self-pay | Admitting: Internal Medicine

## 2022-06-28 NOTE — Telephone Encounter (Signed)
Copied from CRM [id number]. Topic: Medicare AWV >> Jun 28, 2022  1:43 PM Payton Doughty wrote: Reason for CRM: Called patient to schedule Medicare Annual Wellness Visit (AWV). Left message for patient to call back and schedule Medicare Annual Wellness Visit (AWV).  Last date of AWV: 07/09/21  Please schedule an appointment at any time with Donne Anon, CMA  .  If any questions, please contact me.  Thank you ,  Verlee Rossetti; Care Guide Ambulatory Clinical Support [place] [facility] Direct Dial: [phone]

## 2022-07-13 HISTORY — PX: OTHER SURGICAL HISTORY: SHX169

## 2022-07-19 DIAGNOSIS — M19031 Primary osteoarthritis, right wrist: Secondary | ICD-10-CM | POA: Diagnosis not present

## 2022-07-19 DIAGNOSIS — M1811 Unilateral primary osteoarthritis of first carpometacarpal joint, right hand: Secondary | ICD-10-CM | POA: Diagnosis not present

## 2022-08-03 DIAGNOSIS — Z4789 Encounter for other orthopedic aftercare: Secondary | ICD-10-CM | POA: Diagnosis not present

## 2022-08-03 DIAGNOSIS — M1811 Unilateral primary osteoarthritis of first carpometacarpal joint, right hand: Secondary | ICD-10-CM | POA: Diagnosis not present

## 2022-08-10 ENCOUNTER — Other Ambulatory Visit: Payer: Self-pay | Admitting: Internal Medicine

## 2022-08-22 DIAGNOSIS — Z4789 Encounter for other orthopedic aftercare: Secondary | ICD-10-CM | POA: Diagnosis not present

## 2022-08-22 DIAGNOSIS — M79641 Pain in right hand: Secondary | ICD-10-CM | POA: Diagnosis not present

## 2022-08-22 DIAGNOSIS — M1811 Unilateral primary osteoarthritis of first carpometacarpal joint, right hand: Secondary | ICD-10-CM | POA: Diagnosis not present

## 2022-08-22 DIAGNOSIS — M25641 Stiffness of right hand, not elsewhere classified: Secondary | ICD-10-CM | POA: Diagnosis not present

## 2022-09-06 DIAGNOSIS — M25641 Stiffness of right hand, not elsewhere classified: Secondary | ICD-10-CM | POA: Diagnosis not present

## 2022-09-19 DIAGNOSIS — M25641 Stiffness of right hand, not elsewhere classified: Secondary | ICD-10-CM | POA: Diagnosis not present

## 2022-09-21 ENCOUNTER — Ambulatory Visit (INDEPENDENT_AMBULATORY_CARE_PROVIDER_SITE_OTHER): Payer: PPO | Admitting: Internal Medicine

## 2022-09-21 ENCOUNTER — Encounter: Payer: Self-pay | Admitting: Internal Medicine

## 2022-09-21 VITALS — BP 138/74 | HR 66 | Temp 98.0°F | Resp 20 | Ht 60.0 in | Wt 159.0 lb

## 2022-09-21 DIAGNOSIS — E785 Hyperlipidemia, unspecified: Secondary | ICD-10-CM | POA: Diagnosis not present

## 2022-09-21 DIAGNOSIS — F419 Anxiety disorder, unspecified: Secondary | ICD-10-CM | POA: Diagnosis not present

## 2022-09-21 DIAGNOSIS — R42 Dizziness and giddiness: Secondary | ICD-10-CM

## 2022-09-21 DIAGNOSIS — F32A Depression, unspecified: Secondary | ICD-10-CM | POA: Diagnosis not present

## 2022-09-21 DIAGNOSIS — E559 Vitamin D deficiency, unspecified: Secondary | ICD-10-CM | POA: Diagnosis not present

## 2022-09-21 DIAGNOSIS — I951 Orthostatic hypotension: Secondary | ICD-10-CM

## 2022-09-21 DIAGNOSIS — M810 Age-related osteoporosis without current pathological fracture: Secondary | ICD-10-CM | POA: Diagnosis not present

## 2022-09-21 DIAGNOSIS — Z79899 Other long term (current) drug therapy: Secondary | ICD-10-CM

## 2022-09-21 LAB — CBC WITH DIFFERENTIAL/PLATELET
Basophils Absolute: 0 10*3/uL (ref 0.0–0.1)
Basophils Relative: 0.7 % (ref 0.0–3.0)
Eosinophils Absolute: 0.1 10*3/uL (ref 0.0–0.7)
Eosinophils Relative: 2.8 % (ref 0.0–5.0)
HCT: 35.6 % — ABNORMAL LOW (ref 36.0–46.0)
Hemoglobin: 11.7 g/dL — ABNORMAL LOW (ref 12.0–15.0)
Lymphocytes Relative: 23.9 % (ref 12.0–46.0)
Lymphs Abs: 1.1 10*3/uL (ref 0.7–4.0)
MCHC: 32.9 g/dL (ref 30.0–36.0)
MCV: 95.1 fl (ref 78.0–100.0)
Monocytes Absolute: 0.4 10*3/uL (ref 0.1–1.0)
Monocytes Relative: 7.8 % (ref 3.0–12.0)
Neutro Abs: 3.1 10*3/uL (ref 1.4–7.7)
Neutrophils Relative %: 64.8 % (ref 43.0–77.0)
Platelets: 140 10*3/uL — ABNORMAL LOW (ref 150.0–400.0)
RBC: 3.74 Mil/uL — ABNORMAL LOW (ref 3.87–5.11)
RDW: 13.3 % (ref 11.5–15.5)
WBC: 4.8 10*3/uL (ref 4.0–10.5)

## 2022-09-21 LAB — LIPID PANEL
Cholesterol: 131 mg/dL (ref 0–200)
HDL: 55 mg/dL (ref >39.00)
LDL Cholesterol: 54 mg/dL (ref 0–99)
NonHDL: 76.08
Total CHOL/HDL Ratio: 2
Triglycerides: 112 mg/dL (ref 0.0–149.0)
VLDL: 22.4 mg/dL (ref 0.0–40.0)

## 2022-09-21 LAB — VITAMIN D 25 HYDROXY (VIT D DEFICIENCY, FRACTURES): VITD: 37.79 ng/mL (ref 30.00–100.00)

## 2022-09-21 MED ORDER — BUPROPION HCL 100 MG PO TABS: 50.0000 mg | ORAL_TABLET | Freq: Two times a day (BID) | ORAL | Status: DC

## 2022-09-21 NOTE — Assessment & Plan Note (Signed)
Dizziness: As described above, mostly orthostatic. Orthostatic vital signs: Lying down 160/78, sitting 142/76, standing 126/68. Heart rate increased from the 50s to 65. EKG today: Sinus bradycardia.  No acute changes. Recommend to continue be careful standing up, check a CBC,  Saw cardiology in 2017 for similar issues, now is having fall due to this , refer back to cardiology.  Encouraged good hydration and continue to be careful with standing hyperlipidemia: On simvastatin, last LFTs normal, check  FLP Thrombocytopenia: LOV oncology 05/19/2022, treatment is surveillance. Anxiety depression: Feeling very well, cut down medications?. Clonazepam: Currently taking half tablet twice daily On Lexapro 10 mg On Wellbutrin 100 mg twice daily.  Decrease to half tablet twice daily for a month, half tablet daily for a month, then stop Osteopenia: T-score -1.8 in 2021, per guidelines follow-up ~ 3 to 5 years later.  Patient likes to proceed, check a DEXA H/o vitamin D deficiency: check vitamin D. Vaccine advice: COVID booster, RSV, flu shot every fall RTC 3 months CPX

## 2022-09-21 NOTE — Patient Instructions (Addendum)
Wellbutrin 100 mg Half tablet twice daily for a month Half tablet once a day for a month Then stop  Vaccines I recommend:  Covid booster RSV vaccine Flu shot this fall    GO TO THE LAB : Get the blood work     GO TO THE FRONT DESK, PLEASE SCHEDULE YOUR APPOINTMENTS Come back for physical in 3 months

## 2022-09-21 NOTE — Progress Notes (Signed)
Subjective:    Patient ID: Rebecca [name], female    DOB: 08/12/1940, 82 y.o.   MRN: [id number]  DOS:  09/21/2022 Type of visit - description: Routine visit  Chronic medical problems addressed. Anxiety depression well-controlled, cut back on medications?.  Also, has been dizzy for a while, typically notes very dizzy when she stands up, that led to a couple of falls fortunately without major consequence, specifically no head or neck injury. "I have learned to stand up and wait x few seconds before I walk". Denies dizziness with turning her head. No headache, no diplopia.  No slurred speech or motor deficits.   Review of Systems See above   Past Medical History:  Diagnosis Date   Anxiety    Arthritis    degenerative lumbar spine    Chronic neck pain    Closed fracture of base of fifth metatarsal bone 01/2018   Left   Complication of anesthesia    at one time my blood pressure dropped -lumbar - on bp med at that time   Depression    Dysphagia    esophageal stretched 9-14    History of blood transfusion 2009   post - lumbar fusion    Hyperlipidemia    Hypertension    No longer takes med.   Osteoarthritis of left shoulder region    Mild, Dr. Dion Saucier   Osteoporosis     Past Surgical History:  Procedure Laterality Date   ABDOMINAL HYSTERECTOMY     ANTERIOR CERVICAL DECOMP/DISCECTOMY FUSION N/A 11/20/2014   Procedure: ANTERIOR CERVICAL DECOMPRESSION/DISCECTOMY FUSION 2 LEVELS;  Surgeon: Estill Bamberg, MD;  Location: [facility];  Service: Orthopedics;  Laterality: N/A;  Anterior cervical decompression fusion, cervical 3-4, cervical 4-5 with instrumentation and allograft   arm surgery Bilateral    transposed ulner nerve 10 (dr. Amanda Pea)   BACK SURGERY  2009   Dr Robbi Garter SURGERY  01/2014   Dr Wynetta Emery   carpal tunnell  2006   bilateral   CHOLECYSTECTOMY     KNEE SURGERY  11/2009   right,  scope; left knee   NECK SURGERY  05/2008   Dr Wynetta Emery   OOPHORECTOMY Bilateral     PARTIAL KNEE ARTHROPLASTY  01/17/2012   LEFT KNEE   PARTIAL KNEE ARTHROPLASTY  01/17/2012   Procedure: UNICOMPARTMENTAL KNEE;  Surgeon: Eulas Post, MD;  Location: [facility];  Service: Orthopedics;  Laterality: Left;   PARTIAL KNEE ARTHROPLASTY Right 04/21/2020   Procedure: UNICOMPARTMENTAL KNEE;  Surgeon: Teryl Lucy, MD;  Location: [facility];  Service: Orthopedics;  Laterality: Right;   RHINOPLASTY  1980   SHOULDER SURGERY Bilateral    x 2/Dr. Leslee Home   thumb surgery Right 07/2022   Dr Amanda Pea   TONSILLECTOMY      Current Outpatient Medications  Medication Instructions   acetaminophen (TYLENOL) 1,000 mg, Oral, 2 times daily   ascorbic acid (VITAMIN C) 500 mg, Oral, Daily   buPROPion (WELLBUTRIN) 50 mg, Oral, 2 times daily   clonazePAM (KLONOPIN) 0.5 MG tablet TAKE 1 TABLET(0.5 MG) BY MOUTH THREE TIMES DAILY AS NEEDED FOR ANXIETY   escitalopram (LEXAPRO) 10 mg, Oral, Daily   melatonin 5 mg, Oral   Multiple Vitamin (MULTIVITAMIN) tablet 1 tablet, Oral, Daily, Reported on 08/25/2015   Propylene Glycol (SYSTANE BALANCE) 0.6 % SOLN 1 drop, Both Eyes, Daily   simvastatin (ZOCOR) 40 MG tablet TAKE 1 TABLET(40 MG) BY MOUTH AT BEDTIME       Objective:   Physical  Exam BP 138/74 (BP Location: Left Arm, Patient Position: Sitting, Cuff Size: Normal)   Pulse 66   Temp 98 F (36.7 C) (Oral)   Resp 20   Ht 5' (1.524 m)   Wt 159 lb (72.1 kg)   SpO2 96%   BMI 31.05 kg/m  General:   Well developed, NAD, BMI noted. HEENT:  Normocephalic . Face symmetric, atraumatic.  Normal carotid pulses.  EOMI Lungs:  CTA B Normal respiratory effort, no intercostal retractions, no accessory muscle use. Heart: RRR,  no murmur.  Lower extremities: no pretibial edema bilaterally  Skin: Not pale. Not jaundice Neurologic:  alert & oriented X3.  Speech normal, gait appropriate for age and unassisted.  She was very careful transferring. Psych--  Cognition and judgment appear intact.  Cooperative with  normal attention span and concentration.  Behavior appropriate. No anxious or depressed appearing.      Assessment     Assessment  HTN -- on no med since ~ 2009 Hyperlipidemia Anxiety depression: onset Fall 2016 d/t husband health, lost sister 03-2015, lost husband 2021 GI --GERD --Esophageal stricture stretched 2014  --EGD/2017--->  gastritis, bx---reactive gastropathy, no H. pylori. Increased LFTs: 03-2021: [place] FAtty liver and  hep serology (-); 09-2021 labs neg (  transferrin saturation, SLE,   ANA, anti-smooth muscle antibodies ) MSK: --DJD; Chronic neck pain ;  spinal stenosis;  multiple surgeries Osteoporosis DEXA osteoprosis 2006  (per pt) DEXA  11-08 normal   DEXA  9-12 mild osteopenia, rec exercise, ca and vit D T score 05-2015:  (-) 1.4 T score 06/19/2017:  (-) 2.1, RX  vit d, exercise , recheck in 2 years T score -1.8   08/2019 Ao [place] 03-2015 - no AAA Thrombocytopenia, BM Bx 03-2021   PLAN: Dizziness: As described above, mostly orthostatic. Orthostatic vital signs: Lying down 160/78, sitting 142/76, standing 126/68. Heart rate increased from the 50s to 65. EKG today: Sinus bradycardia.  No acute changes. Recommend to continue be careful standing up, check a CBC,  Saw cardiology in 2017 for similar issues, now is having fall due to this , refer back to cardiology.  Encouraged good hydration and continue to be careful with standing hyperlipidemia: On simvastatin, last LFTs normal, check  FLP Thrombocytopenia: LOV oncology 05/19/2022, treatment is surveillance. Anxiety depression: Feeling very well, cut down medications?. Clonazepam: Currently taking half tablet twice daily On Lexapro 10 mg On Wellbutrin 100 mg twice daily.  Decrease to half tablet twice daily for a month, half tablet daily for a month, then stop Osteopenia: T-score -1.8 in 2021, per guidelines follow-up ~ 3 to 5 years later.  Patient likes to proceed, check a DEXA H/o vitamin D deficiency: check vitamin  D. Vaccine advice: COVID booster, RSV, flu shot every fall RTC 3 months CPX

## 2022-09-23 LAB — DRUG MONITORING PANEL 375977 , URINE
Alcohol Metabolites: NEGATIVE ng/mL (ref <500)
Alphahydroxyalprazolam: NEGATIVE ng/mL (ref <25)
Alphahydroxymidazolam: NEGATIVE ng/mL (ref <50)
Alphahydroxytriazolam: NEGATIVE ng/mL (ref <50)
Aminoclonazepam: 811 ng/mL — ABNORMAL HIGH (ref <25)
Amphetamines: NEGATIVE ng/mL (ref <500)
Barbiturates: NEGATIVE ng/mL (ref <300)
Benzodiazepines: POSITIVE ng/mL — AB (ref <100)
Cocaine Metabolite: NEGATIVE ng/mL (ref <150)
Desmethyltramadol: NEGATIVE ng/mL (ref <100)
Hydroxyethylflurazepam: NEGATIVE ng/mL (ref <50)
Lorazepam: NEGATIVE ng/mL (ref <50)
Marijuana Metabolite: NEGATIVE ng/mL (ref <20)
Nordiazepam: NEGATIVE ng/mL (ref <50)
Opiates: NEGATIVE ng/mL (ref <100)
Oxazepam: NEGATIVE ng/mL (ref <50)
Oxycodone: NEGATIVE ng/mL (ref <100)
Temazepam: NEGATIVE ng/mL (ref <50)
Tramadol: NEGATIVE ng/mL (ref <100)

## 2022-09-23 LAB — DM TEMPLATE

## 2022-09-26 DIAGNOSIS — M25641 Stiffness of right hand, not elsewhere classified: Secondary | ICD-10-CM | POA: Diagnosis not present

## 2022-09-26 DIAGNOSIS — Z4789 Encounter for other orthopedic aftercare: Secondary | ICD-10-CM | POA: Diagnosis not present

## 2022-10-06 ENCOUNTER — Ambulatory Visit (HOSPITAL_BASED_OUTPATIENT_CLINIC_OR_DEPARTMENT_OTHER)
Admission: RE | Admit: 2022-10-06 | Discharge: 2022-10-06 | Disposition: A | Discharge status: Home / Self Care | Payer: PPO | Source: Ambulatory Visit | Attending: Internal Medicine | Admitting: Internal Medicine

## 2022-10-06 DIAGNOSIS — Z780 Asymptomatic menopausal state: Secondary | ICD-10-CM | POA: Diagnosis not present

## 2022-10-06 DIAGNOSIS — M810 Age-related osteoporosis without current pathological fracture: Secondary | ICD-10-CM | POA: Insufficient documentation

## 2022-10-10 ENCOUNTER — Telehealth: Payer: Self-pay | Admitting: Internal Medicine

## 2022-10-10 NOTE — Telephone Encounter (Signed)
Can we get cost of Prolia for Pt please? I can discuss cost and results w/ her all at same time.

## 2022-10-10 NOTE — Telephone Encounter (Signed)
Pt would be new to Prolia, could you all check on this?

## 2022-10-10 NOTE — Telephone Encounter (Signed)
Advise patient: Has osteoporosis, recommend treatment. She has a history of esophageal stricture, as long as she has no have any difficulty swallowing in the last couple of years okay to start Fosamax 70 mg 1 tablet weekly. Please explain her the precautions. If she has difficulty swallowing, we could try Reclast yearly or Prolia.

## 2022-10-12 ENCOUNTER — Telehealth: Payer: Self-pay

## 2022-10-12 NOTE — Telephone Encounter (Signed)
Prolia VOB initiated via [email or web address] 

## 2022-10-12 NOTE — Telephone Encounter (Signed)
Created new encounter for Prolia BIV. Will route encounter back once benefit verification is complete.  

## 2022-10-13 ENCOUNTER — Ambulatory Visit (INDEPENDENT_AMBULATORY_CARE_PROVIDER_SITE_OTHER): Payer: PPO | Admitting: *Deleted

## 2022-10-13 DIAGNOSIS — Z0000 Encounter for general adult medical examination without abnormal findings: Secondary | ICD-10-CM | POA: Diagnosis not present

## 2022-10-13 NOTE — Patient Instructions (Signed)
Rebecca [name] , Thank you for taking time to come for your Medicare Wellness Visit. I appreciate your ongoing commitment to your health goals. Please review the following plan we discussed and let me know if I can assist you in the future.     This is a list of the screening recommended for you and due dates:  Health Maintenance  Topic Date Due   COVID-19 Vaccine (4 - 2023-24 season) 11/12/2021   Flu Shot  10/13/2022   Medicare Annual Wellness Visit  10/13/2023   DTaP/Tdap/Td vaccine (2 - Tdap) 05/19/2026   Pneumonia Vaccine  Completed   DEXA scan (bone density measurement)  Completed   Zoster (Shingles) Vaccine  Completed   HPV Vaccine  Aged Out   Hepatitis C Screening  Discontinued    Next appointment: Follow up in one year for your annual wellness visit.   Preventive Care 70 Years and Older, Female Preventive care refers to lifestyle choices and visits with your health care provider that can promote health and wellness. What does preventive care include? A yearly physical exam. This is also called an annual well check. Dental exams once or twice a year. Routine eye exams. Ask your health care provider how often you should have your eyes checked. Personal lifestyle choices, including: Daily care of your teeth and gums. Regular physical activity. Eating a healthy diet. Avoiding tobacco and drug use. Limiting alcohol use. Practicing safe sex. Taking low-dose aspirin every day. Taking vitamin and mineral supplements as recommended by your health care provider. What happens during an annual well check? The services and screenings done by your health care provider during your annual well check will depend on your age, overall health, lifestyle risk factors, and family history of disease. Counseling  Your health care provider may ask you questions about your: Alcohol use. Tobacco use. Drug use. Emotional well-being. Home and relationship well-being. Sexual activity. Eating  habits. History of falls. Memory and ability to understand (cognition). Work and work Astronomer. Reproductive health. Screening  You may have the following tests or measurements: Height, weight, and BMI. Blood pressure. Lipid and cholesterol levels. These may be checked every 5 years, or more frequently if you are over 69 years old. Skin check. Lung cancer screening. You may have this screening every year starting at age 48 if you have a 30-pack-year history of smoking and currently smoke or have quit within the past 15 years. Fecal occult blood test (FOBT) of the stool. You may have this test every year starting at age 57. Flexible sigmoidoscopy or colonoscopy. You may have a sigmoidoscopy every 5 years or a colonoscopy every 10 years starting at age 48. Hepatitis C blood test. Hepatitis B blood test. Sexually transmitted disease (STD) testing. Diabetes screening. This is done by checking your blood sugar (glucose) after you have not eaten for a while (fasting). You may have this done every 1-3 years. Bone density scan. This is done to screen for osteoporosis. You may have this done starting at age 66. Mammogram. This may be done every 1-2 years. Talk to your health care provider about how often you should have regular mammograms. Talk with your health care provider about your test results, treatment options, and if necessary, the need for more tests. Vaccines  Your health care provider may recommend certain vaccines, such as: Influenza vaccine. This is recommended every year. Tetanus, diphtheria, and acellular pertussis (Tdap, Td) vaccine. You may need a Td booster every 10 years. Zoster vaccine. You may need  this after age 62. Pneumococcal 13-valent conjugate (PCV13) vaccine. One dose is recommended after age 24. Pneumococcal polysaccharide (PPSV23) vaccine. One dose is recommended after age 18. Talk to your health care provider about which screenings and vaccines you need and how  often you need them. This information is not intended to replace advice given to you by your health care provider. Make sure you discuss any questions you have with your health care provider. Document Released: 03/27/2015 Document Revised: 11/18/2015 Document Reviewed: 12/30/2014 Elsevier Interactive Patient Education  2017 [employer].  Fall Prevention in the Home Falls can cause injuries. They can happen to people of all ages. There are many things you can do to make your home safe and to help prevent falls. What can I do on the outside of my home? Regularly fix the edges of walkways and driveways and fix any cracks. Remove anything that might make you trip as you walk through a door, such as a raised step or threshold. Trim any bushes or trees on the path to your home. Use bright outdoor lighting. Clear any walking paths of anything that might make someone trip, such as rocks or tools. Regularly check to see if handrails are loose or broken. Make sure that both sides of any steps have handrails. Any raised decks and porches should have guardrails on the edges. Have any leaves, snow, or ice cleared regularly. Use sand or salt on walking paths during winter. Clean up any spills in your garage right away. This includes oil or grease spills. What can I do in the bathroom? Use night lights. Install grab bars by the toilet and in the tub and shower. Do not use towel bars as grab bars. Use non-skid mats or decals in the tub or shower. If you need to sit down in the shower, use a plastic, non-slip stool. Keep the floor dry. Clean up any water that spills on the floor as soon as it happens. Remove soap buildup in the tub or shower regularly. Attach bath mats securely with double-sided non-slip rug tape. Do not have throw rugs and other things on the floor that can make you trip. What can I do in the bedroom? Use night lights. Make sure that you have a light by your bed that is easy to  reach. Do not use any sheets or blankets that are too big for your bed. They should not hang down onto the floor. Have a firm chair that has side arms. You can use this for support while you get dressed. Do not have throw rugs and other things on the floor that can make you trip. What can I do in the kitchen? Clean up any spills right away. Avoid walking on wet floors. Keep items that you use a lot in easy-to-reach places. If you need to reach something above you, use a strong step stool that has a grab bar. Keep electrical cords out of the way. Do not use floor polish or wax that makes floors slippery. If you must use wax, use non-skid floor wax. Do not have throw rugs and other things on the floor that can make you trip. What can I do with my stairs? Do not leave any items on the stairs. Make sure that there are handrails on both sides of the stairs and use them. Fix handrails that are broken or loose. Make sure that handrails are as long as the stairways. Check any carpeting to make sure that it is firmly attached to  the stairs. Fix any carpet that is loose or worn. Avoid having throw rugs at the top or bottom of the stairs. If you do have throw rugs, attach them to the floor with carpet tape. Make sure that you have a light switch at the top of the stairs and the bottom of the stairs. If you do not have them, ask someone to add them for you. What else can I do to help prevent falls? Wear shoes that: Do not have high heels. Have rubber bottoms. Are comfortable and fit you well. Are closed at the toe. Do not wear sandals. If you use a stepladder: Make sure that it is fully opened. Do not climb a closed stepladder. Make sure that both sides of the stepladder are locked into place. Ask someone to hold it for you, if possible. Clearly mark and make sure that you can see: Any grab bars or handrails. First and last steps. Where the edge of each step is. Use tools that help you move  around (mobility aids) if they are needed. These include: Canes. Walkers. Scooters. Crutches. Turn on the lights when you go into a dark area. Replace any light bulbs as soon as they burn out. Set up your furniture so you have a clear path. Avoid moving your furniture around. If any of your floors are uneven, fix them. If there are any pets around you, be aware of where they are. Review your medicines with your doctor. Some medicines can make you feel dizzy. This can increase your chance of falling. Ask your doctor what other things that you can do to help prevent falls. This information is not intended to replace advice given to you by your health care provider. Make sure you discuss any questions you have with your health care provider. Document Released: 12/25/2008 Document Revised: 08/06/2015 Document Reviewed: 04/04/2014 Elsevier Interactive Patient Education  2017 [employer].

## 2022-10-13 NOTE — Progress Notes (Signed)
Subjective:   Rebecca [name] is a 82 y.o. female who presents for Medicare Annual (Subsequent) preventive examination.  Visit Complete: Virtual  I connected with  Rebecca [name] on 10/13/22 by a audio enabled telemedicine application and verified that I am speaking with the correct person using two identifiers.  Patient Location: Home  Provider Location: Office/Clinic  I discussed the limitations of evaluation and management by telemedicine. The patient expressed understanding and agreed to proceed.  Review of Systems     Cardiac Risk Factors include: advanced age (>72men, >43 women);dyslipidemia;hypertension     Objective:    Vital Signs: Unable to obtain new vitals due to this being a telehealth visit.      10/13/2022    3:54 PM 05/19/2022    9:01 AM 11/18/2021   12:05 PM 07/15/2021   11:57 AM 07/07/2021    1:21 PM 03/17/2021   11:56 AM 04/21/2020    5:37 AM  Advanced Directives  Does Patient Have a Medical Advance Directive? Yes Yes Yes Yes Yes Yes Yes  Type of Estate agent of [place];Living will Living will;Healthcare Power of [employer] Power of [place];Living will Living will;Healthcare Power of [employer] Power of [place];Out of facility DNR (pink MOST or yellow form);Living will  Living will;Healthcare Power of Attorney  Does patient want to make changes to medical advance directive? No - Patient declined No - Patient declined No - Patient declined No - Patient declined   No - Patient declined  Copy of Healthcare Power of Attorney in Chart? Yes - validated most recent copy scanned in chart (See row information)  Yes - validated most recent copy scanned in chart (See row information)  Yes - validated most recent copy scanned in chart (See row information)      Current Medications (verified) Outpatient Encounter Medications as of 10/13/2022  Medication Sig   acetaminophen (TYLENOL) 500 MG tablet Take 1,000 mg by mouth in the  morning and at bedtime.   buPROPion (WELLBUTRIN) 100 MG tablet Take 0.5 tablets (50 mg total) by mouth 2 (two) times daily.   clonazePAM (KLONOPIN) 0.5 MG tablet TAKE 1 TABLET(0.5 MG) BY MOUTH THREE TIMES DAILY AS NEEDED FOR ANXIETY   escitalopram (LEXAPRO) 10 MG tablet Take 1 tablet (10 mg total) by mouth daily.   melatonin 5 MG TABS Take 5 mg by mouth.   Multiple Vitamin (MULTIVITAMIN) tablet Take 1 tablet by mouth daily. Reported on 08/25/2015   Propylene Glycol (SYSTANE BALANCE) 0.6 % SOLN Place 1 drop into both eyes daily.   simvastatin (ZOCOR) 40 MG tablet TAKE 1 TABLET(40 MG) BY MOUTH AT BEDTIME   vitamin C (ASCORBIC ACID) 500 MG tablet Take 500 mg by mouth daily.   VITAMIN D PO Take 1,000 Int'l Units by mouth daily.   No facility-administered encounter medications on file as of 10/13/2022.    Allergies (verified) Tape, Alendronate sodium, Cortizone-10 [hydrocortisone], Oxycodone-acetaminophen, and Penicillins   History: Past Medical History:  Diagnosis Date   Anxiety    Arthritis    degenerative lumbar spine    Chronic neck pain    Closed fracture of base of fifth metatarsal bone 01/2018   Left   Complication of anesthesia    at one time my blood pressure dropped -lumbar - on bp med at that time   Depression    Dysphagia    esophageal stretched 9-14    History of blood transfusion 2009   post - lumbar fusion  Hyperlipidemia    Hypertension    No longer takes med.   Osteoarthritis of left shoulder region    Mild, Dr. Dion Saucier   Osteoporosis    Past Surgical History:  Procedure Laterality Date   ABDOMINAL HYSTERECTOMY     ANTERIOR CERVICAL DECOMP/DISCECTOMY FUSION N/A 11/20/2014   Procedure: ANTERIOR CERVICAL DECOMPRESSION/DISCECTOMY FUSION 2 LEVELS;  Surgeon: Estill Bamberg, MD;  Location: [facility] OR;  Service: Orthopedics;  Laterality: N/A;  Anterior cervical decompression fusion, cervical 3-4, cervical 4-5 with instrumentation and allograft   arm surgery Bilateral     transposed ulner nerve 10 (dr. Amanda Pea)   BACK SURGERY  2009   Dr Robbi Garter SURGERY  01/2014   Dr Wynetta Emery   carpal tunnell  2006   bilateral   CHOLECYSTECTOMY     KNEE SURGERY  11/2009   right,  scope; left knee   NECK SURGERY  05/2008   Dr Wynetta Emery   OOPHORECTOMY Bilateral    PARTIAL KNEE ARTHROPLASTY  01/17/2012   LEFT KNEE   PARTIAL KNEE ARTHROPLASTY  01/17/2012   Procedure: UNICOMPARTMENTAL KNEE;  Surgeon: Eulas Post, MD;  Location: [facility];  Service: Orthopedics;  Laterality: Left;   PARTIAL KNEE ARTHROPLASTY Right 04/21/2020   Procedure: UNICOMPARTMENTAL KNEE;  Surgeon: Teryl Lucy, MD;  Location: [facility] ORS;  Service: Orthopedics;  Laterality: Right;   RHINOPLASTY  1980   SHOULDER SURGERY Bilateral    x 2/Dr. Leslee Home   thumb surgery Right 07/2022   Dr Amanda Pea   TONSILLECTOMY     Family History  Problem Relation Age of Onset   Prostate cancer Father    Diabetes Sister    Heart disease Sister        sister and brothers x 2   Kidney disease Sister    Kidney disease Brother    Heart disease Brother    Stroke Daughter    Stomach cancer Maternal Aunt    Colon cancer Neg Hx    Breast cancer Neg Hx    Esophageal cancer Neg Hx    Pancreatic cancer Neg Hx    Social History   Socioeconomic History   Marital status: Widowed    Spouse name: [place].   Number of children: 4   Years of education: Not on file   Highest education level: Not on file  Occupational History   Occupation: stay home   Tobacco Use   Smoking status: Never   Smokeless tobacco: Never  Vaping Use   Vaping status: Never Used  Substance and Sexual Activity   Alcohol use: Not Currently    Comment: rare   Drug use: No   Sexual activity: Not Currently  Other Topics Concern   Not on file  Social History Narrative   Lost husband 2021   Household: pt and son Rosanne Ashing   Children: 3 daughters and 1 son   Social Determinants of Health   Financial Resource Strain: Low Risk  (10/13/2022)   Overall  Financial Resource Strain (CARDIA)    Difficulty of Paying Living Expenses: Not hard at all  Food Insecurity: No Food Insecurity (10/13/2022)   Hunger Vital Sign    Worried About Running Out of Food in the Last Year: Never true    Ran Out of Food in the Last Year: Never true  Transportation Needs: No Transportation Needs (10/13/2022)   PRAPARE - Administrator, Civil Service (Medical): No    Lack of Transportation (Non-Medical): No  Physical Activity: Inactive (10/13/2022)  Exercise Vital Sign    Days of Exercise per Week: 0 days    Minutes of Exercise per Session: 0 min  Stress: No Stress Concern Present (10/13/2022)   [employer] of Occupational Health - Occupational Stress Questionnaire    Feeling of Stress : Not at all  Social Connections: Socially Integrated (10/13/2022)   Social Connection and Isolation Panel [NHANES]    Frequency of Communication with Friends and Family: More than three times a week    Frequency of Social Gatherings with Friends and Family: More than three times a week    Attends Religious Services: More than 4 times per year    Active Member of [employer] or Organizations: Yes    Attends Engineer, structural: More than 4 times per year    Marital Status: Married    Tobacco Counseling Counseling given: Not Answered   Clinical Intake:  Pre-visit preparation completed: Yes  Pain : No/denies pain  Nutritional Risks: None Diabetes: No  How often do you need to have someone help you when you read instructions, pamphlets, or other written materials from your doctor or pharmacy?: 1 - Never  Interpreter Needed?: No  Information entered by :: [employer], CMA   Activities of Daily Living    10/13/2022    3:42 PM  In your present state of health, do you have any difficulty performing the following activities:  Hearing? 1  Vision? 0  Difficulty concentrating or making decisions? 0  Walking or climbing stairs? 0  Dressing or bathing? 0   Doing errands, shopping? 0  Preparing Food and eating ? N  Using the Toilet? N  In the past six months, have you accidently leaked urine? N  Do you have problems with loss of bowel control? N  Managing your Medications? N  Managing your Finances? N  Housekeeping or managing your Housekeeping? N    Patient Care Team: Wanda Plump, MD as PCP - Margretta Sidle, MD as Consulting Physician (Neurosurgery) Teryl Lucy, MD as Consulting Physician (Orthopedic Surgery) Estill Bamberg, MD as Consulting Physician (Orthopedic Surgery) Regan Lemming, MD as Consulting Physician (Cardiology) Day, Dannielle Karvonen, [facility] (Inactive) as Pharmacist (Pharmacist)  Indicate any recent Medical Services you may have received from other than [facility] providers in the past year (date may be approximate).     Assessment:   This is a routine wellness examination for [place].  Hearing/Vision screen No results found.  Dietary issues and exercise activities discussed:     Goals Addressed   None    Depression Screen    10/13/2022    3:52 PM 09/21/2022   11:22 AM 03/23/2022    8:55 AM 09/13/2021    9:08 AM 07/07/2021    1:17 PM 06/10/2021    9:58 AM 12/10/2020   10:03 AM  PHQ 2/9 Scores  PHQ - 2 Score 0 0 0 0 0 0 2  PHQ- 9 Score  0 0 0 0 0 8    Fall Risk    10/13/2022    3:46 PM 03/23/2022    9:02 AM 09/13/2021    8:52 AM 07/07/2021    1:17 PM 06/10/2021    9:30 AM  Fall Risk   Falls in the past year? 1 0 0 0 0  Number falls in past yr: 1 0 0 0 0  Injury with Fall? 0 0 0 0 0  Risk for fall due to : History of fall(s);Other (Comment)   No Fall Risks  Risk for fall due to: Comment had a few dizzy spells      Follow up Falls evaluation completed Falls evaluation completed Falls evaluation completed Falls evaluation completed Falls evaluation completed    MEDICARE RISK AT HOME:  Medicare Risk at Home - 10/13/22 1544     Any stairs in or around the home? Yes    If so, are there any without handrails?  No    Home free of loose throw rugs in walkways, pet beds, electrical cords, etc? Yes    Adequate lighting in your home to reduce risk of falls? Yes    Life alert? No    Use of a cane, walker or w/c? No    Grab bars in the bathroom? Yes    Shower chair or bench in shower? Yes    Elevated toilet seat or a handicapped toilet? Yes             TIMED UP AND GO:  Was the test performed?  No    Cognitive Function:    05/22/2017    8:34 AM 05/11/2015    2:54 PM  MMSE - Mini Mental State Exam  Orientation to time 5 5  Orientation to Place 5 5  Registration 3 3  Attention/ Calculation 5 5  Recall 3 3  Language- name 2 objects 2 2  Language- repeat 1 1  Language- follow 3 step command 3 3  Language- read & follow direction 1 1  Write a sentence 1 1  Copy design 1 1  Total score 30 30        10/13/2022    3:57 PM 07/07/2021    1:31 PM  6CIT Screen  What Year? 0 points 0 points  What month? 0 points 0 points  What time? 0 points 0 points  Count back from 20 0 points 0 points  Months in reverse 0 points 0 points  Repeat phrase 0 points 0 points  Total Score 0 points 0 points    Immunizations Immunization History  Administered Date(s) Administered   Fluad Quad(high Dose 65+) 04/01/2020, 12/10/2020   Influenza Split 11/25/2011, 11/21/2013   Influenza Whole 01/16/2007, 02/24/2009, 12/14/2009   Influenza, High Dose Seasonal PF 12/19/2012, 12/22/2016, 12/16/2018, 01/12/2022   Influenza,inj,Quad PF,6+ Mos 01/01/2018   Influenza-Unspecified 12/17/2014, 11/24/2015, 12/22/2016   Moderna Covid-19 Vaccine Bivalent Booster 33yrs & up 09/13/2021   Moderna SARS-COV2 Booster Vaccination 09/10/2020   Moderna Sars-Covid-2 Vaccination 11/04/2019, 12/06/2019   Pneumococcal Conjugate-13 03/26/2014   Pneumococcal Polysaccharide-23 01/16/2007, 07/24/2017   Td 05/18/2016   Zoster Recombinant(Shingrix) 09/13/2021, 11/18/2021   Zoster, Live 12/29/2009    TDAP status: Up to date  Flu  Vaccine status: Due, Education has been provided regarding the importance of this vaccine. Advised may receive this vaccine at local pharmacy or Health Dept. Aware to provide a copy of the vaccination record if obtained from local pharmacy or Health Dept. Verbalized acceptance and understanding.  Pneumococcal vaccine status: Up to date  Covid-19 vaccine status: Information provided on how to obtain vaccines.   Qualifies for Shingles Vaccine? Yes   Zostavax completed Yes   Shingrix Completed?: Yes  Screening Tests Health Maintenance  Topic Date Due   COVID-19 Vaccine (4 - 2023-24 season) 11/12/2021   Medicare Annual Wellness (AWV)  07/08/2022   INFLUENZA VACCINE  10/13/2022   DTaP/Tdap/Td (2 - Tdap) 05/19/2026   Pneumonia Vaccine 8+ Years old  Completed   DEXA SCAN  Completed   Zoster Vaccines- Shingrix  Completed  HPV VACCINES  Aged Out   Hepatitis C Screening  Discontinued    Health Maintenance  Health Maintenance Due  Topic Date Due   COVID-19 Vaccine (4 - 2023-24 season) 11/12/2021   Medicare Annual Wellness (AWV)  07/08/2022   INFLUENZA VACCINE  10/13/2022    Colorectal cancer screening: No longer required.   Mammogram status: No longer required due to age.  Bone Density status: Completed 10/06/22. Results reflect: Bone density results: OSTEOPOROSIS. Repeat every 2 years.  Lung Cancer Screening: (Low Dose CT Chest recommended if Age 14-80 years, 20 pack-year currently smoking OR have quit w/in 15years.) does not qualify.   Additional Screening:  Hepatitis C Screening: does not qualify  Vision Screening: Recommended annual ophthalmology exams for early detection of glaucoma and other disorders of the eye. Is the patient up to date with their annual eye exam?  Yes  Who is the provider or what is the name of the office in which the patient attends annual eye exams? [facility] If pt is not established with a provider, would they like to be referred to a provider to  establish care? No .   Dental Screening: Recommended annual dental exams for proper oral hygiene  Diabetic Foot Exam: N/a  Community Resource Referral / Chronic Care Management: CRR required this visit?  No   CCM required this visit?  No     Plan:     I have personally reviewed and noted the following in the patient's chart:   Medical and social history Use of alcohol, tobacco or illicit drugs  Current medications and supplements including opioid prescriptions. Patient is not currently taking opioid prescriptions. Functional ability and status Nutritional status Physical activity Advanced directives List of other physicians Hospitalizations, surgeries, and ER visits in previous 12 months Vitals Screenings to include cognitive, depression, and falls Referrals and appointments  In addition, I have reviewed and discussed with patient certain preventive protocols, quality metrics, and best practice recommendations. A written personalized care plan for preventive services as well as general preventive health recommendations were provided to patient.     Donne Anon, CMA   10/13/2022   After Visit Summary: Mailed to pt.  Nurse Notes: None

## 2022-10-25 ENCOUNTER — Other Ambulatory Visit (HOSPITAL_COMMUNITY): Payer: Self-pay

## 2022-10-25 NOTE — Telephone Encounter (Signed)
Pt ready for scheduling for PROLIA on or after : 10/25/22  Out-of-pocket cost due at time of visit: $320  Primary: [employer] Prolia co-insurance: 20% Admin fee co-insurance: 0%  Secondary: --- Prolia co-insurance:  Admin fee co-insurance:   Medical Benefit Details: Date Benefits were checked: 10/25/22 Deductible: NO/ Coinsurance: 20%/ Admin Fee: 0%  Prior Auth: N/A PA# Expiration Date:   # of doses approved:  Pharmacy benefit: Copay $200 If patient wants fill through the pharmacy benefit please send prescription to: [employer], and include estimated need by date in rx notes. Pharmacy will ship medication directly to the office.  Patient NOT eligible for Prolia Copay Card. Copay Card can make patient's cost as little as $25. Link to apply: [email or web address]://[email or web address]  ** This summary of benefits is an estimation of the patient's out-of-pocket cost. Exact cost may very based on individual plan coverage.

## 2022-10-25 NOTE — Telephone Encounter (Signed)
   20% coinsurance, no deductible, no prior auth. needed 

## 2022-11-02 NOTE — Telephone Encounter (Addendum)
Patient will think about getting injection.  She will discuss with Dr. Drue Novel at her next visit. (Info sheet on Prolia sent to pt)

## 2022-11-04 ENCOUNTER — Telehealth: Payer: Self-pay | Admitting: Internal Medicine

## 2022-11-04 NOTE — Telephone Encounter (Signed)
Requesting: clonazepam 0.5mg   Contract: 03/23/22 UDS: 09/21/22 Last Visit: 09/21/22 Next Visit: 12/28/22 Last Refill: 05/26/22 #270 and 0RF   Please Advise

## 2022-11-04 NOTE — Telephone Encounter (Signed)
Pdmp ok, rx sent  ? ?

## 2022-11-08 ENCOUNTER — Other Ambulatory Visit: Payer: Self-pay | Admitting: Internal Medicine

## 2022-11-09 NOTE — Telephone Encounter (Signed)
Pt called stating she would like to have the shot ordered to [facility] for them to administer it. Location is as follows:  [facility] [address] [place], [place], [place] [address] P: [phone]

## 2022-11-11 ENCOUNTER — Other Ambulatory Visit (HOSPITAL_COMMUNITY): Payer: Self-pay

## 2022-11-11 ENCOUNTER — Other Ambulatory Visit: Payer: Self-pay

## 2022-11-11 MED ORDER — DENOSUMAB 60 MG/ML ~~LOC~~ SOSY
60.0000 mg | PREFILLED_SYRINGE | Freq: Once | SUBCUTANEOUS | 0 refills | Status: AC
  Filled 2022-11-11: qty 1, 180d supply, fill #0
  Filled 2022-11-11: qty 1, 1d supply, fill #0

## 2022-11-11 NOTE — Telephone Encounter (Signed)
Spoke with [facility] and they do not give the injection.  Called patient back and advised that we will send to specialty pharmacy and to be on the lookout for a call for them to collect payment and give consent.

## 2022-11-11 NOTE — Addendum Note (Signed)
Addended by: Thelma Barge D on: 11/11/2022 11:42 AM   Modules accepted: Orders

## 2022-11-15 ENCOUNTER — Other Ambulatory Visit: Payer: Self-pay

## 2022-11-15 ENCOUNTER — Other Ambulatory Visit (HOSPITAL_COMMUNITY): Payer: Self-pay

## 2022-11-16 ENCOUNTER — Telehealth: Payer: Self-pay | Admitting: *Deleted

## 2022-11-16 NOTE — Telephone Encounter (Signed)
Prolia received today.  Placed in fridge.

## 2022-11-17 ENCOUNTER — Inpatient Hospital Stay: Payer: PPO | Admitting: Hematology & Oncology

## 2022-11-17 ENCOUNTER — Telehealth: Payer: Self-pay

## 2022-11-17 ENCOUNTER — Other Ambulatory Visit: Payer: Self-pay

## 2022-11-17 ENCOUNTER — Encounter: Payer: Self-pay | Admitting: Hematology & Oncology

## 2022-11-17 ENCOUNTER — Ambulatory Visit (INDEPENDENT_AMBULATORY_CARE_PROVIDER_SITE_OTHER): Payer: PPO

## 2022-11-17 ENCOUNTER — Inpatient Hospital Stay: Payer: PPO | Attending: Hematology & Oncology

## 2022-11-17 VITALS — BP 137/66 | HR 64 | Temp 97.8°F | Resp 18 | Ht 60.0 in | Wt 157.1 lb

## 2022-11-17 DIAGNOSIS — D696 Thrombocytopenia, unspecified: Secondary | ICD-10-CM

## 2022-11-17 DIAGNOSIS — M810 Age-related osteoporosis without current pathological fracture: Secondary | ICD-10-CM | POA: Diagnosis not present

## 2022-11-17 LAB — CMP (CANCER CENTER ONLY)
ALT: 31 U/L (ref 0–44)
AST: 32 U/L (ref 15–41)
Albumin: 4.1 g/dL (ref 3.5–5.0)
Alkaline Phosphatase: 52 U/L (ref 38–126)
Anion gap: 8 (ref 5–15)
BUN: 32 mg/dL — ABNORMAL HIGH (ref 8–23)
CO2: 24 mmol/L (ref 22–32)
Calcium: 9.4 mg/dL (ref 8.9–10.3)
Chloride: 110 mmol/L (ref 98–111)
Creatinine: 1.36 mg/dL — ABNORMAL HIGH (ref 0.44–1.00)
GFR, Estimated: 39 mL/min — ABNORMAL LOW (ref >60)
Glucose, Bld: 84 mg/dL (ref 70–99)
Potassium: 4.3 mmol/L (ref 3.5–5.1)
Sodium: 142 mmol/L (ref 135–145)
Total Bilirubin: 0.4 mg/dL (ref 0.3–1.2)
Total Protein: 6.7 g/dL (ref 6.5–8.1)

## 2022-11-17 LAB — CBC WITH DIFFERENTIAL (CANCER CENTER ONLY)
Abs Immature Granulocytes: 0.02 10*3/uL (ref 0.00–0.07)
Basophils Absolute: 0 10*3/uL (ref 0.0–0.1)
Basophils Relative: 1 %
Eosinophils Absolute: 0.1 10*3/uL (ref 0.0–0.5)
Eosinophils Relative: 3 %
HCT: 37.2 % (ref 36.0–46.0)
Hemoglobin: 12 g/dL (ref 12.0–15.0)
Immature Granulocytes: 1 %
Lymphocytes Relative: 38 %
Lymphs Abs: 1.6 10*3/uL (ref 0.7–4.0)
MCH: 31.4 pg (ref 26.0–34.0)
MCHC: 32.3 g/dL (ref 30.0–36.0)
MCV: 97.4 fL (ref 80.0–100.0)
Monocytes Absolute: 0.4 10*3/uL (ref 0.1–1.0)
Monocytes Relative: 9 %
Neutro Abs: 2 10*3/uL (ref 1.7–7.7)
Neutrophils Relative %: 48 %
Platelet Count: 115 10*3/uL — ABNORMAL LOW (ref 150–400)
RBC: 3.82 MIL/uL — ABNORMAL LOW (ref 3.87–5.11)
RDW: 12.9 % (ref 11.5–15.5)
WBC Count: 4.1 10*3/uL (ref 4.0–10.5)
nRBC: 0 % (ref 0.0–0.2)

## 2022-11-17 LAB — LACTATE DEHYDROGENASE: LDH: 141 U/L (ref 98–192)

## 2022-11-17 LAB — SAVE SMEAR(SSMR), FOR PROVIDER SLIDE REVIEW

## 2022-11-17 MED ORDER — DENOSUMAB 60 MG/ML ~~LOC~~ SOSY
60.0000 mg | PREFILLED_SYRINGE | Freq: Once | SUBCUTANEOUS | Status: AC
Start: 2022-11-17 — End: 2022-11-17
  Administered 2022-11-17: 60 mg via SUBCUTANEOUS

## 2022-11-17 NOTE — Progress Notes (Signed)
Pt here today for first Prolia injection per Dr. Drue Novel.  Pt supplied injection. Prolia 60mg /mL injected into R arm.Pt tolerated injection well.  Prolia information given to Pt. Next in 6 months.

## 2022-11-17 NOTE — Progress Notes (Signed)
Hematology and Oncology Follow Up Visit  Rebecca [name] [facility] [id number] 12/17/1940 82 y.o. 11/17/2022   Principle Diagnosis:  Thrombocytopenia-mild  Current Therapy:   Observation     Interim History:  Rebecca [name] is back for follow-up.  We saw her 6 months ago.  It seems like her family seems to have some health issues.  She has a couple daughters who have had their own health problems.  She has a special needs son who had back surgery..  She, herself, had thumb surgery to replace the joint with her right thumb.  This was back in May.  She fine got the cast off.  Overall, she has been managing fairly well.  She has had no problems with bleeding or bruising.  She has had no cough or shortness of breath.  She has had no change in bowel or bladder habits.  Overall, I would say that her performance status is probably ECOG 1.       Medications:  Current Outpatient Medications:    acetaminophen (TYLENOL) 500 MG tablet, Take 1,000 mg by mouth in the morning and at bedtime., Disp: , Rfl:    buPROPion (WELLBUTRIN) 100 MG tablet, Take 0.5 tablets (50 mg total) by mouth 2 (two) times daily., Disp: , Rfl:    clonazePAM (KLONOPIN) 0.5 MG tablet, TAKE 1 TABLET(0.5 MG) BY MOUTH THREE TIMES DAILY AS NEEDED FOR ANXIETY, Disp: 270 tablet, Rfl: 0   escitalopram (LEXAPRO) 10 MG tablet, Take 1 tablet (10 mg total) by mouth daily., Disp: 90 tablet, Rfl: 1   melatonin 5 MG TABS, Take 5 mg by mouth., Disp: , Rfl:    Multiple Vitamin (MULTIVITAMIN) tablet, Take 1 tablet by mouth daily. Reported on 08/25/2015, Disp: , Rfl:    Propylene Glycol (SYSTANE BALANCE) 0.6 % SOLN, Place 1 drop into both eyes daily., Disp: , Rfl:    simvastatin (ZOCOR) 40 MG tablet, Take 1 tablet (40 mg total) by mouth at bedtime., Disp: 90 tablet, Rfl: 1   vitamin C (ASCORBIC ACID) 500 MG tablet, Take 500 mg by mouth daily., Disp: , Rfl:    VITAMIN D PO, Take 1,000 Int'l Units by mouth daily., Disp: , Rfl:   Allergies:  Allergies   Allergen Reactions   Tape Other (See Comments)    White adhesive tape -blisters   Alendronate Sodium Other (See Comments)    REACTION: jaw pain   Cortizone-10 [Hydrocortisone] Other (See Comments)    Pt received cortizone shot, and became very flushed, and hot, blisters, raw areas around lips & ears    Oxycodone-Acetaminophen Hives   Penicillins Swelling and Rash    Past Medical History, Surgical history, Social history, and Family History were reviewed and updated.  Review of Systems: Review of Systems  Constitutional: Negative.   HENT:  Negative.    Eyes: Negative.   Respiratory: Negative.    Cardiovascular: Negative.   Gastrointestinal: Negative.   Endocrine: Negative.   Musculoskeletal: Negative.   Skin: Negative.   Neurological: Negative.   Hematological:  Bruises/bleeds easily.  Psychiatric/Behavioral: Negative.      Physical Exam:  height is 5' (1.524 m) and weight is 157 lb 1.9 oz (71.3 kg). Her oral temperature is 97.8 F (36.6 C). Her blood pressure is 137/66 and her pulse is 64. Her respiration is 18 and oxygen saturation is 100%.   Wt Readings from Last 3 Encounters:  11/17/22 157 lb 1.9 oz (71.3 kg)  09/21/22 159 lb (72.1 kg)  05/19/22 153 lb (69.4 kg)  Physical Exam Vitals reviewed.  HENT:     Head: Normocephalic and atraumatic.  Eyes:     Pupils: Pupils are equal, round, and reactive to light.  Cardiovascular:     Rate and Rhythm: Normal rate and regular rhythm.     Heart sounds: Normal heart sounds.  Pulmonary:     Effort: Pulmonary effort is normal.     Breath sounds: Normal breath sounds.  Abdominal:     General: Bowel sounds are normal.     Palpations: Abdomen is soft.  Musculoskeletal:        General: No tenderness or deformity. Normal range of motion.     Cervical back: Normal range of motion.  Lymphadenopathy:     Cervical: No cervical adenopathy.  Skin:    General: Skin is warm and dry.     Findings: No erythema or rash.   Neurological:     Mental Status: She is alert and oriented to person, place, and time.  Psychiatric:        Behavior: Behavior normal.        Thought Content: Thought content normal.        Judgment: Judgment normal.      Lab Results  Component Value Date   WBC 4.1 11/17/2022   HGB 12.0 11/17/2022   HCT 37.2 11/17/2022   MCV 97.4 11/17/2022   PLT 115 (L) 11/17/2022     Chemistry      Component Value Date/Time   NA 142 11/17/2022 0852   K 4.3 11/17/2022 0852   CL 110 11/17/2022 0852   CO2 24 11/17/2022 0852   BUN 32 (H) 11/17/2022 0852   CREATININE 1.36 (H) 11/17/2022 0852   CREATININE 1.06 (H) 02/10/2021 1044      Component Value Date/Time   CALCIUM 9.4 11/17/2022 0852   ALKPHOS 52 11/17/2022 0852   AST 32 11/17/2022 0852   ALT 31 11/17/2022 0852   BILITOT 0.4 11/17/2022 0852      Impression and Plan: Rebecca [name] is a very nice 82 year old white female.  She is the wife of one of our former patients.  She has thrombocytopenia.  This is mild.  She has had this chronically.  She had the bone marrow test done which really was unremarkable.    I have to believe that we probably are looking at an element of myelodysplasia.  However, I really do not see that there is any way or reason that we have to treat her for this.  Her platelet count is down a little bit.  However, I just suspect that she probably will have a range for her platelet count.  I looked at her blood smear under the microscope.  I do not see anything that looked significant.  I do not see any activity that looked suspicious for any type of progressive disease.  For right now, I still think that we can get her back in 6 months.   Josph Macho, MD 9/5/20249:49 AM

## 2022-11-17 NOTE — Telephone Encounter (Signed)
-----   Message from [facility] C sent at 11/17/2022 10:34 AM EDT ----- Regarding: Prolia Pt received her first Prolia today, 11/17/2022.

## 2022-11-17 NOTE — Telephone Encounter (Signed)
Next injection due on/after 05/17/2023

## 2022-12-03 ENCOUNTER — Encounter (HOSPITAL_COMMUNITY): Payer: Self-pay

## 2022-12-12 ENCOUNTER — Encounter: Payer: Self-pay | Admitting: Cardiovascular Disease

## 2022-12-12 NOTE — Progress Notes (Unsigned)
  Cardiology Office Note:  .   Date:  12/12/2022  ID:  Rebecca [name], DOB [birth year], MRN [id number] PCP: Wanda Plump, MD  [facility] Providers Cardiologist:  None { Click to update primary MD,subspecialty MD or APP then REFRESH:1}   History of Present Illness: .   Rebecca [name] is a 82 y.o. female with hx of orthostatic hypotension   ROS: ***  Studies Reviewed: .        *** Risk Assessment/Calculations:   {Does this patient have ATRIAL FIBRILLATION?[phone]} No BP recorded.  {Refresh Note OR Click here to enter BP  :1}***       Physical Exam:   VS:  There were no vitals taken for this visit.   Wt Readings from Last 3 Encounters:  11/17/22 157 lb 1.9 oz (71.3 kg)  09/21/22 159 lb (72.1 kg)  05/19/22 153 lb (69.4 kg)    GEN: Well nourished, well developed in no acute distress NECK: No JVD; No carotid bruits CARDIAC: ***RRR, no murmurs, rubs, gallops RESPIRATORY:  Clear to auscultation without rales, wheezing or rhonchi  ABDOMEN: Soft, non-tender, non-distended EXTREMITIES:  No edema; No deformity   ASSESSMENT AND PLAN: .   ***    {Are you ordering a CV Procedure (e.g. stress test, cath, DCCV, TEE, etc)?   Press F2        :[id number]}  Dispo: ***  Signed, Kristeen Miss, MD

## 2022-12-13 ENCOUNTER — Encounter: Payer: Self-pay | Admitting: Cardiovascular Disease

## 2022-12-13 ENCOUNTER — Ambulatory Visit: Payer: PPO | Attending: Cardiovascular Disease | Admitting: Cardiovascular Disease

## 2022-12-13 VITALS — BP 118/80 | HR 68 | Ht 60.0 in | Wt 159.6 lb

## 2022-12-13 DIAGNOSIS — I951 Orthostatic hypotension: Secondary | ICD-10-CM | POA: Diagnosis not present

## 2022-12-13 NOTE — Patient Instructions (Signed)
Add a glass of V-8 juice to your daily routine - once - twice a day to help with your orthostatic hypotension  Medication Instructions:  Your physician recommends that you continue on your current medications as directed. Please refer to the Current Medication list given to you today.  *If you need a refill on your cardiac medications before your next appointment, please call your pharmacy*   Lab Work: NONE If you have labs (blood work) drawn today and your tests are completely normal, you will receive your results only by: MyChart Message (if you have MyChart) OR A paper copy in the mail If you have any lab test that is abnormal or we need to change your treatment, we will call you to review the results.   Testing/Procedures: NONE   Follow-Up: At [facility], you and your health needs are our priority.  As part of our continuing mission to provide you with exceptional heart care, we have created designated Provider Care Teams.  These Care Teams include your primary Cardiologist (physician) and Advanced Practice Providers (APPs -  Physician Assistants and Nurse Practitioners) who all work together to provide you with the care you need, when you need it.  We recommend signing up for the patient portal called "MyChart".  Sign up information is provided on this After Visit Summary.  MyChart is used to connect with patients for Virtual Visits (Telemedicine).  Patients are able to view lab/test results, encounter notes, upcoming appointments, etc.  Non-urgent messages can be sent to your provider as well.   To learn more about what you can do with MyChart, go to [email or web address].    Your next appointment:   As Needed  Provider:   Kristeen Miss, MD

## 2022-12-28 ENCOUNTER — Ambulatory Visit (INDEPENDENT_AMBULATORY_CARE_PROVIDER_SITE_OTHER): Payer: PPO | Admitting: Internal Medicine

## 2022-12-28 ENCOUNTER — Encounter: Payer: Self-pay | Admitting: Internal Medicine

## 2022-12-28 VITALS — BP 138/86 | HR 60 | Temp 98.2°F | Resp 16 | Ht 60.0 in | Wt 161.2 lb

## 2022-12-28 DIAGNOSIS — Z0000 Encounter for general adult medical examination without abnormal findings: Secondary | ICD-10-CM | POA: Diagnosis not present

## 2022-12-28 NOTE — Assessment & Plan Note (Signed)
Here for CPX  Dizziness: Saw cardiology, sxs consistent with orthostatic hypotension.  Was noted to be better with hydration.  No additional workup needed. Orthostatic hypotension: see above Thrombocytopenia: Mild, chronic, saw hemat in September.  Next visit in 6 months. Osteoporosis.  Per DEXA 09-2022, discussed Fosamax versus Prolia, Got her first Prolia 11/17/2022. Anxiety: Currently well-controlled on clonazepam and Lexapro, has gradually decrease Wellbutrin to half tablet at bedtime.  We agreed he will stop it when she runs out. Other  problems are stable. RTC 6 months

## 2022-12-28 NOTE — Progress Notes (Signed)
Subjective:    Patient ID: Rebecca [name], female    DOB: 02/22/1941, 82 y.o.   MRN: [id number]  DOS:  12/28/2022 Type of visit - description: cpx  Here for CPX Doing well. Has no major concerns. Occasional runny nose for the last 3 weeks without fever or chills. Typically happens when she works on her flowers.   Review of Systems  Other than above, a 14 point review of systems is negative    Past Medical History:  Diagnosis Date   Anxiety    Arthritis    degenerative lumbar spine    Chronic neck pain    Closed fracture of base of fifth metatarsal bone 01/2018   Left   Complication of anesthesia    at one time my blood pressure dropped -lumbar - on bp med at that time   Depression    Dysphagia    esophageal stretched 9-14    History of blood transfusion 2009   post - lumbar fusion    Hyperlipidemia    Hypertension    No longer takes med.   Osteoarthritis of left shoulder region    Mild, Dr. Dion Saucier   Osteoporosis     Past Surgical History:  Procedure Laterality Date   ABDOMINAL HYSTERECTOMY     ANTERIOR CERVICAL DECOMP/DISCECTOMY FUSION N/A 11/20/2014   Procedure: ANTERIOR CERVICAL DECOMPRESSION/DISCECTOMY FUSION 2 LEVELS;  Surgeon: Estill Bamberg, MD;  Location: [facility] OR;  Service: Orthopedics;  Laterality: N/A;  Anterior cervical decompression fusion, cervical 3-4, cervical 4-5 with instrumentation and allograft   arm surgery Bilateral    transposed ulner nerve 10 (dr. Amanda Pea)   BACK SURGERY  2009   Dr Robbi Garter SURGERY  01/2014   Dr Wynetta Emery   carpal tunnell  2006   bilateral   CHOLECYSTECTOMY     KNEE SURGERY  11/2009   right,  scope; left knee   NECK SURGERY  05/2008   Dr Wynetta Emery   OOPHORECTOMY Bilateral    PARTIAL KNEE ARTHROPLASTY  01/17/2012   LEFT KNEE   PARTIAL KNEE ARTHROPLASTY  01/17/2012   Procedure: UNICOMPARTMENTAL KNEE;  Surgeon: Eulas Post, MD;  Location: [facility] OR;  Service: Orthopedics;  Laterality: Left;   PARTIAL KNEE ARTHROPLASTY  Right 04/21/2020   Procedure: UNICOMPARTMENTAL KNEE;  Surgeon: Teryl Lucy, MD;  Location: [facility] ORS;  Service: Orthopedics;  Laterality: Right;   RHINOPLASTY  1980   SHOULDER SURGERY Bilateral    x 2/Dr. Leslee Home   thumb surgery Right 07/2022   Dr Amanda Pea   TONSILLECTOMY     Social History   Socioeconomic History   Marital status: Widowed    Spouse name: Winn Jock.   Number of children: 4   Years of education: Not on file   Highest education level: Not on file  Occupational History   Occupation: stay home   Tobacco Use   Smoking status: Never   Smokeless tobacco: Never  Vaping Use   Vaping status: Never Used  Substance and Sexual Activity   Alcohol use: Not Currently    Comment: rare   Drug use: No   Sexual activity: Not Currently  Other Topics Concern   Not on file  Social History Narrative   Lost husband 2021   Household: pt and son Rosanne Ashing   Children: 3 daughters and 1 son   Social Determinants of Health   Financial Resource Strain: Low Risk  (10/13/2022)   Overall Financial Resource Strain (CARDIA)    Difficulty of Paying  Living Expenses: Not hard at all  Food Insecurity: No Food Insecurity (10/13/2022)   Hunger Vital Sign    Worried About Running Out of Food in the Last Year: Never true    Ran Out of Food in the Last Year: Never true  Transportation Needs: No Transportation Needs (10/13/2022)   PRAPARE - Administrator, Civil Service (Medical): No    Lack of Transportation (Non-Medical): No  Physical Activity: Inactive (10/13/2022)   Exercise Vital Sign    Days of Exercise per Week: 0 days    Minutes of Exercise per Session: 0 min  Stress: No Stress Concern Present (10/13/2022)   [employer] of Occupational Health - Occupational Stress Questionnaire    Feeling of Stress : Not at all  Social Connections: Socially Integrated (10/13/2022)   Social Connection and Isolation Panel [NHANES]    Frequency of Communication with Friends and Family: More than three  times a week    Frequency of Social Gatherings with Friends and Family: More than three times a week    Attends Religious Services: More than 4 times per year    Active Member of [employer] or Organizations: Yes    Attends Engineer, structural: More than 4 times per year    Marital Status: Married  Catering manager Violence: Not At Risk (10/13/2022)   Humiliation, Afraid, Rape, and Kick questionnaire    Fear of Current or Ex-Partner: No    Emotionally Abused: No    Physically Abused: No    Sexually Abused: No    Current Outpatient Medications  Medication Instructions   acetaminophen (TYLENOL) 1,000 mg, Oral, 2 times daily   ascorbic acid (VITAMIN C) 500 mg, Oral, Daily   clonazePAM (KLONOPIN) 0.5 MG tablet TAKE 1 TABLET(0.5 MG) BY MOUTH THREE TIMES DAILY AS NEEDED FOR ANXIETY   denosumab (PROLIA) 60 mg, Subcutaneous, Every 6 months   escitalopram (LEXAPRO) 10 mg, Oral, Daily   loratadine (CLARITIN) 10 mg, Oral, Daily   melatonin 5 mg, Oral   Multiple Vitamin (MULTIVITAMIN) tablet 1 tablet, Oral, Daily, Reported on 08/25/2015   Propylene Glycol (SYSTANE BALANCE) 0.6 % SOLN 1 drop, Both Eyes, Daily   simvastatin (ZOCOR) 40 mg, Oral, Daily at bedtime   vitamin B-12 (CYANOCOBALAMIN) 100 mcg, Oral, Daily   VITAMIN D PO 1,000 Int'l Units, Oral, Daily       Objective:   Physical Exam BP 138/86   Pulse 60   Temp 98.2 F (36.8 C) (Oral)   Resp 16   Ht 5' (1.524 m)   Wt 161 lb 4 oz (73.1 kg)   SpO2 97%   BMI 31.49 kg/m  General: Well developed, NAD, BMI noted Neck: No  thyromegaly  HEENT:  Normocephalic . Face symmetric, atraumatic Lungs:  CTA B Normal respiratory effort, no intercostal retractions, no accessory muscle use. Heart: RRR,  no murmur.  Abdomen:  Not distended, soft, non-tender. No rebound or rigidity.   Lower extremities: no pretibial edema bilaterally  Skin: Exposed areas without rash. Not pale. Not jaundice Neurologic:  alert & oriented X3.  Speech  normal, gait appropriate for age and unassisted.  Needs some help transferring. Strength symmetric and appropriate for age.  Psych: Cognition and judgment appear intact.  Cooperative with normal attention span and concentration.  Behavior appropriate. No anxious or depressed appearing.     Assessment    Assessment  HTN -- on no med since ~ 2009 Hyperlipidemia Anxiety depression: onset Fall 2016 d/t husband health, lost  sister 03-2015, lost husband 2021 GI --GERD --Esophageal stricture stretched 2014  --EGD/2017--->  gastritis, bx---reactive gastropathy, no H. pylori. Increased LFTs: 03-2021: [place] FAtty liver and  hep serology (-); 09-2021 labs neg (  transferrin saturation, SLE,   ANA, anti-smooth muscle antibodies ) MSK: --DJD; Chronic neck pain ;  spinal stenosis;  multiple surgeries Osteoporosis DEXA osteoprosis 2006  (per pt) DEXA  11-08 normal   DEXA  9-12 mild osteopenia, rec exercise, ca and vit D T score 05-2015:  (-) 1.4 T score 06/19/2017:  (-) 2.1, RX  vit d, exercise , recheck in 2 years T score -1.8   08/2019 Osteoporosis.  Per DEXA 09-2022, discussed Fosamax versus Prolia, Got her first Prolia 11/17/2022. Ao [place] 03-2015 - no AAA Thrombocytopenia, BM Bx 03-2021  PLAN: Here for CPX -  Td:  05-2016 - PNM  23: 2008 and 07/2017; prevnar 2015.    - zostavax:  12-2009; s/p  shingrix  - had a RSV 12/06/2023 -Had a flu shot. - Recommend: COVID booster if not done recently  --- See previous entries, no further breast, colon or cervical cancer screening. -- (+) FH CAD, pt asx, on statins ---Palpable aorta: [place] Ao 03-2015 wnl  -- POA on file  -- labs: Reviewed.  Dizziness: Saw cardiology, sxs consistent with orthostatic hypotension.  Was noted to be better with hydration.  No additional workup needed. Orthostatic hypotension: see above Thrombocytopenia: Mild, chronic, saw hemat in September.  Next visit in 6 months. Osteoporosis.  Per DEXA 09-2022, discussed Fosamax versus Prolia, Got  her first Prolia 11/17/2022. Anxiety: Currently well-controlled on clonazepam and Lexapro, has gradually decrease Wellbutrin to half tablet at bedtime.  We agreed he will stop it when she runs out. Other  problems are stable. RTC 6 months

## 2022-12-28 NOTE — Patient Instructions (Addendum)
Vaccines I recommend: Covid booster  Okay to stop Wellbutrin when you run out.      Next visit with me in 6 months Please schedule it at the front desk

## 2022-12-28 NOTE — Assessment & Plan Note (Signed)
Here for CPX -  Td:  05-2016 - PNM  23: 2008 and 07/2017; prevnar 2015.    - zostavax:  12-2009; s/p  shingrix  - had a RSV 12/06/2023 -Had a flu shot. - Recommend: COVID booster if not done recently  --- See previous entries, no further breast, colon or cervical cancer screening. -- (+) FH CAD, pt asx, on statins ---Palpable aorta: [place] Ao 03-2015 wnl  -- POA on file  -- labs: Reviewed.

## 2023-01-25 ENCOUNTER — Encounter: Payer: Self-pay | Admitting: Gastroenterology

## 2023-02-07 ENCOUNTER — Telehealth: Payer: Self-pay | Admitting: Internal Medicine

## 2023-02-07 NOTE — Telephone Encounter (Signed)
Requesting: clonazepam 0.5mg  Contract: 03/23/22 UDS: 09/21/22 Last Visit: 12/28/22 Next Visit: 06/28/23 Last Refill: 11/04/22 #270 and 0RF   Please Advise

## 2023-02-08 NOTE — Telephone Encounter (Signed)
PDMP okay, Rx sent 

## 2023-03-17 ENCOUNTER — Other Ambulatory Visit: Payer: Self-pay | Admitting: Orthopedic Surgery

## 2023-03-17 ENCOUNTER — Encounter: Payer: Self-pay | Admitting: Orthopedic Surgery

## 2023-03-17 DIAGNOSIS — G8929 Other chronic pain: Secondary | ICD-10-CM

## 2023-03-17 DIAGNOSIS — M19011 Primary osteoarthritis, right shoulder: Secondary | ICD-10-CM

## 2023-03-17 DIAGNOSIS — M19012 Primary osteoarthritis, left shoulder: Secondary | ICD-10-CM | POA: Diagnosis not present

## 2023-03-17 DIAGNOSIS — M542 Cervicalgia: Secondary | ICD-10-CM | POA: Diagnosis not present

## 2023-03-20 ENCOUNTER — Ambulatory Visit
Admission: RE | Admit: 2023-03-20 | Discharge: 2023-03-20 | Disposition: A | Discharge status: Home / Self Care | Payer: PPO | Source: Ambulatory Visit | Attending: Orthopedic Surgery | Admitting: Orthopedic Surgery

## 2023-03-20 DIAGNOSIS — M19011 Primary osteoarthritis, right shoulder: Secondary | ICD-10-CM | POA: Diagnosis not present

## 2023-03-20 DIAGNOSIS — M75121 Complete rotator cuff tear or rupture of right shoulder, not specified as traumatic: Secondary | ICD-10-CM | POA: Diagnosis not present

## 2023-03-20 DIAGNOSIS — G8929 Other chronic pain: Secondary | ICD-10-CM

## 2023-03-30 ENCOUNTER — Telehealth: Payer: Self-pay

## 2023-03-30 NOTE — Telephone Encounter (Signed)
Received surgery clearance form. Pt needing R reverse total shoulder replacement with Dr. Dion Saucier on 05/09/23.     Annice Pih- can you contact Pt to schedule surgery clearance please?

## 2023-04-03 NOTE — Telephone Encounter (Signed)
Pt scheduled for clearance paperwork on 04-07-23. Done.

## 2023-04-06 NOTE — Telephone Encounter (Signed)
Rescheduled to 04/12/23- provider sick

## 2023-04-07 ENCOUNTER — Ambulatory Visit: Payer: PPO | Admitting: Internal Medicine

## 2023-04-12 ENCOUNTER — Ambulatory Visit (HOSPITAL_BASED_OUTPATIENT_CLINIC_OR_DEPARTMENT_OTHER)
Admission: RE | Admit: 2023-04-12 | Discharge: 2023-04-12 | Disposition: A | Discharge status: Home / Self Care | Payer: PPO | Source: Ambulatory Visit | Attending: Internal Medicine | Admitting: Internal Medicine

## 2023-04-12 ENCOUNTER — Ambulatory Visit: Payer: PPO | Admitting: Internal Medicine

## 2023-04-12 ENCOUNTER — Encounter: Payer: Self-pay | Admitting: Internal Medicine

## 2023-04-12 VITALS — BP 136/72 | HR 96 | Temp 98.1°F | Resp 16 | Ht 60.0 in | Wt 141.4 lb

## 2023-04-12 DIAGNOSIS — Z01818 Encounter for other preprocedural examination: Secondary | ICD-10-CM | POA: Diagnosis not present

## 2023-04-12 DIAGNOSIS — R634 Abnormal weight loss: Secondary | ICD-10-CM | POA: Insufficient documentation

## 2023-04-12 DIAGNOSIS — E0590 Thyrotoxicosis, unspecified without thyrotoxic crisis or storm: Secondary | ICD-10-CM

## 2023-04-12 NOTE — Progress Notes (Signed)
Subjective:    Patient ID: Rebecca [name], female    DOB: [birth year], 83 y.o.   MRN: [id number]  DOS:  04/12/2023 Type of visit - description: Here for surgical clearance  Here for surgical clearance.  Other than the right shoulder pain she feels well.  I noticed weight loss, the patient was aware she was losing weight for the last few weeks. She has not changed her diet.  Denies being depressed.  Appetite is okay and denies postprandial abdominal pain.  No fever or chills.  No headaches No chest pain or difficulty breathing. No cough. No diarrhea. She is actually active at home doing yard work.   Wt Readings from Last 3 Encounters:  04/12/23 141 lb 6 oz (64.1 kg)  12/28/22 161 lb 4 oz (73.1 kg)  12/13/22 159 lb 9.6 oz (72.4 kg)     Review of Systems See above   Past Medical History:  Diagnosis Date   Anxiety    Arthritis    degenerative lumbar spine    Chronic neck pain    Closed fracture of base of fifth metatarsal bone 01/2018   Left   Complication of anesthesia    at one time my blood pressure dropped -lumbar - on bp med at that time   Depression    Dysphagia    esophageal stretched 9-14    History of blood transfusion 2009   post - lumbar fusion    Hyperlipidemia    Hypertension    No longer takes med.   Osteoarthritis of left shoulder region    Mild, Dr. Dion Saucier   Osteoporosis     Past Surgical History:  Procedure Laterality Date   ABDOMINAL HYSTERECTOMY     ANTERIOR CERVICAL DECOMP/DISCECTOMY FUSION N/A 11/20/2014   Procedure: ANTERIOR CERVICAL DECOMPRESSION/DISCECTOMY FUSION 2 LEVELS;  Surgeon: Estill Bamberg, MD;  Location: [facility] OR;  Service: Orthopedics;  Laterality: N/A;  Anterior cervical decompression fusion, cervical 3-4, cervical 4-5 with instrumentation and allograft   arm surgery Bilateral    transposed ulner nerve 10 (dr. Amanda Pea)   BACK SURGERY  2009   Dr Robbi Garter SURGERY  01/2014   Dr Wynetta Emery   carpal tunnell  2006   bilateral    CHOLECYSTECTOMY     KNEE SURGERY  11/2009   right,  scope; left knee   NECK SURGERY  05/2008   Dr Wynetta Emery   OOPHORECTOMY Bilateral    PARTIAL KNEE ARTHROPLASTY  01/17/2012   LEFT KNEE   PARTIAL KNEE ARTHROPLASTY  01/17/2012   Procedure: UNICOMPARTMENTAL KNEE;  Surgeon: Eulas Post, MD;  Location: [facility] OR;  Service: Orthopedics;  Laterality: Left;   PARTIAL KNEE ARTHROPLASTY Right 04/21/2020   Procedure: UNICOMPARTMENTAL KNEE;  Surgeon: Teryl Lucy, MD;  Location: [facility];  Service: Orthopedics;  Laterality: Right;   RHINOPLASTY  1980   SHOULDER SURGERY Bilateral    x 2/Dr. Leslee Home   thumb surgery Right 07/2022   Dr Amanda Pea   TONSILLECTOMY      Current Outpatient Medications  Medication Instructions   acetaminophen (TYLENOL) 1,000 mg, 2 times daily   ascorbic acid (VITAMIN C) 500 mg, Daily   clonazePAM (KLONOPIN) 0.5 MG tablet TAKE 1 TABLET(0.5 MG) BY MOUTH THREE TIMES DAILY AS NEEDED FOR ANXIETY   denosumab (PROLIA) 60 mg, Every 6 months   escitalopram (LEXAPRO) 10 mg, Oral, Daily   loratadine (CLARITIN) 10 mg, Daily   melatonin 5 mg   Multiple Vitamin (MULTIVITAMIN) tablet 1 tablet, Daily  Propylene Glycol (SYSTANE BALANCE) 0.6 % SOLN 1 drop, Daily   simvastatin (ZOCOR) 40 mg, Oral, Daily at bedtime   vitamin B-12 (CYANOCOBALAMIN) 100 mcg, Daily   VITAMIN D PO 1,000 Int'l Units, Daily       Objective:   Physical Exam BP 136/72   Pulse 96   Temp 98.1 F (36.7 C)   Resp 16   Ht 5' (1.524 m)   Wt 141 lb 6 oz (64.1 kg)   SpO2 92%   BMI 27.61 kg/m  General:   Well developed, NAD, BMI noted.  HEENT:  Normocephalic . Face symmetric, atraumatic Neck: No thyromegaly Lungs:  CTA B Normal respiratory effort, no intercostal retractions, no accessory muscle use. Heart: RRR,  no murmur.  Abdomen:  Not distended, soft, non-tender. No rebound or rigidity.  Palpable nontender aorta without bruit Skin: Not pale. Not jaundice Lower extremities: no pretibial edema  bilaterally  Neurologic:  alert & oriented X3.  Speech normal, gait appropriate for age and unassisted Psych--  Cognition and judgment appear intact.  Cooperative with normal attention span and concentration.  Behavior appropriate. No anxious or depressed appearing.     Assessment     Assessment  HTN -- on no med since ~ 2009 Hyperlipidemia Anxiety depression: onset Fall 2016 d/t husband health, lost sister 03-2015, lost husband 2021 GI --GERD --Esophageal stricture stretched 2014  --EGD/2017--->  gastritis, bx---reactive gastropathy, no H. pylori. Increased LFTs: 03-2021: [place] [facility] liver and  hep serology (-); 09-2021 labs neg (  transferrin saturation, SLE,   ANA, anti-smooth muscle antibodies ) MSK: --DJD; Chronic neck pain ;  spinal stenosis;  multiple surgeries Osteoporosis DEXA osteoprosis 2006  (per pt) DEXA  11-08 normal   DEXA  9-12 mild osteopenia, rec exercise, ca and vit D T score 05-2015:  (-) 1.4 T score 06/19/2017:  (-) 2.1, RX  vit d, exercise , recheck in 2 years T score -1.8   08/2019 Osteoporosis.  Per DEXA 09-2022, discussed Fosamax versus Prolia, Got her first Prolia 11/17/2022. Ao [place] 03-2015 - no AAA Thrombocytopenia, BM Bx 03-2021  PLAN: Surgical clearance: To have R reverse total shoulder next month.  Denies cardiopulmonary symptoms, she is clear from my side pending blood work.  They have requested cardiology clearance as well. Weight loss: Back in October 2024, her weight was around 160 pounds, today is 141 pounds with a BMI of 27. On chart review, her weight has fluctuated, but is definitely decreasing lately. ROS negative.  Will get TSH, chest x-ray.  Reassess in 2 months. Addendum: TSH suppressed, diet is a likely Show for weight loss.  Refer to endocrinology RTC 2 months.

## 2023-04-12 NOTE — Patient Instructions (Addendum)
Vaccines I recommend: Covid booster     GO TO THE LAB : Get the blood work     Next visit with me in 2 months  please schedule it at the front desk

## 2023-04-13 LAB — CBC WITH DIFFERENTIAL/PLATELET
Basophils Absolute: 0 10*3/uL (ref 0.0–0.1)
Basophils Relative: 0.8 % (ref 0.0–3.0)
Eosinophils Absolute: 0.1 10*3/uL (ref 0.0–0.7)
Eosinophils Relative: 1.8 % (ref 0.0–5.0)
HCT: 36.3 % (ref 36.0–46.0)
Hemoglobin: 11.9 g/dL — ABNORMAL LOW (ref 12.0–15.0)
Lymphocytes Relative: 24.7 % (ref 12.0–46.0)
Lymphs Abs: 1.3 10*3/uL (ref 0.7–4.0)
MCHC: 32.7 g/dL (ref 30.0–36.0)
MCV: 92.6 fL (ref 78.0–100.0)
Monocytes Absolute: 0.6 10*3/uL (ref 0.1–1.0)
Monocytes Relative: 11.1 % (ref 3.0–12.0)
Neutro Abs: 3.2 10*3/uL (ref 1.4–7.7)
Neutrophils Relative %: 61.6 % (ref 43.0–77.0)
Platelets: 169 10*3/uL (ref 150.0–400.0)
RBC: 3.92 Mil/uL (ref 3.87–5.11)
RDW: 12.9 % (ref 11.5–15.5)
WBC: 5.1 10*3/uL (ref 4.0–10.5)

## 2023-04-13 LAB — COMPREHENSIVE METABOLIC PANEL
ALT: 16 U/L (ref 0–35)
AST: 23 U/L (ref 0–37)
Albumin: 3.8 g/dL (ref 3.5–5.2)
Alkaline Phosphatase: 59 U/L (ref 39–117)
BUN: 31 mg/dL — ABNORMAL HIGH (ref 6–23)
CO2: 26 meq/L (ref 19–32)
Calcium: 9.4 mg/dL (ref 8.4–10.5)
Chloride: 107 meq/L (ref 96–112)
Creatinine, Ser: 1.08 mg/dL (ref 0.40–1.20)
GFR: 47.72 mL/min — ABNORMAL LOW (ref >60.00)
Glucose, Bld: 102 mg/dL — ABNORMAL HIGH (ref 70–99)
Potassium: 4.6 meq/L (ref 3.5–5.1)
Sodium: 142 meq/L (ref 135–145)
Total Bilirubin: 0.5 mg/dL (ref 0.2–1.2)
Total Protein: 6.4 g/dL (ref 6.0–8.3)

## 2023-04-13 LAB — TSH: TSH: <0.01 u[IU]/mL — ABNORMAL LOW (ref 0.35–5.50)

## 2023-04-14 NOTE — Addendum Note (Signed)
Addended byConrad Pope D on: 04/14/2023 07:54 AM   Modules accepted: Orders

## 2023-04-14 NOTE — Assessment & Plan Note (Signed)
Surgical clearance: To have R reverse total shoulder next month.  Denies cardiopulmonary symptoms, she is clear from my side pending blood work.  They have requested cardiology clearance as well. Weight loss: Back in October 2024, her weight was around 160 pounds, today is 141 pounds with a BMI of 27. On chart review, her weight has fluctuated, but is definitely decreasing lately. ROS negative.  Will get TSH, chest x-ray.  Reassess in 2 months. Addendum: TSH suppressed, diet is a likely Show for weight loss.  Refer to endocrinology RTC 2 months.

## 2023-04-19 ENCOUNTER — Ambulatory Visit: Payer: PPO | Admitting: Internal Medicine

## 2023-04-19 VITALS — BP 122/64 | HR 105 | Ht 60.0 in | Wt 141.0 lb

## 2023-04-19 DIAGNOSIS — E0580 Other thyrotoxicosis without thyrotoxic crisis or storm: Secondary | ICD-10-CM

## 2023-04-19 NOTE — Patient Instructions (Addendum)
 Please stop at the lab.  Please return in 3 months but possibly sooner for labs.  Hyperthyroidism Hyperthyroidism refers to a thyroid  gland that is too active or overactive. The thyroid  gland is a small gland located in the lower front part of the neck, just in front of the windpipe (trachea). This gland makes hormones that: Help control how the body uses food for energy (metabolism). Help the heart and brain work well. Keep your bones strong. When the thyroid  is overactive, it produces too much of a hormone called thyroxine. What are the causes? This condition may be caused by: Graves' disease. This is a disorder in which the body's disease-fighting system (immune system) attacks the thyroid  gland. This is the most common cause. Inflammation of the thyroid  gland. A tumor in the thyroid  gland. Use of certain medicines, including: Prescription thyroid  hormone replacement. Herbal supplements that mimic thyroid  hormones. Amiodarone therapy. Solid or fluid-filled lumps within your thyroid  gland (thyroid  nodules). Taking in a large amount of iodine  from foods or medicines. What increases the risk? You are more likely to develop this condition if: You are female. You have a family history of thyroid  conditions. You smoke tobacco. You use a medicine called lithium. You take medicines that affect the immune system (immunosuppressants). What are the signs or symptoms? Symptoms of this condition include: Nervousness. Inability to tolerate heat. Diarrhea. Rapid heart rate. Shaky hands. Restlessness. Sleep problems. Other symptoms may include: Heart skipping beats or making extra beats. Unexplained weight loss. Change in the texture of hair or skin. Loss of menstruation. Fatigue. Enlarged thyroid  gland or a lump in the thyroid  (nodule). You may also have symptoms of Graves' disease, which may include: Protruding eyes. Dry eyes. Red or swollen eyes. Problems with vision. How is  this diagnosed? This condition may be diagnosed based on: Your symptoms and medical history. A physical exam. Blood tests. Thyroid  ultrasound. This test involves using sound waves to produce images of the thyroid  gland. A thyroid  scan. A radioactive substance is injected into a vein, and images show how much iodine  is present in the thyroid . Radioactive iodine  uptake test (RAIU). A small amount of radioactive iodine  is given by mouth to see how much iodine  the thyroid  absorbs after a certain amount of time. How is this treated? Treatment depends on the cause and severity of the condition. Treatment may include: Medicines to reduce the amount of thyroid  hormone your body makes. Medicines to help manage your symptoms. Radioactive iodine  treatment (radioiodine therapy). This involves swallowing a small dose of radioactive iodine , in capsule or liquid form, to kill thyroid  cells. Surgery to remove part or all of your thyroid  gland. You may need to take thyroid  hormone replacement medicine for the rest of your life after thyroid  surgery. Follow these instructions at home:  Take over-the-counter and prescription medicines only as told by your health care provider. Do not use any products that contain nicotine or tobacco. These products include cigarettes, chewing tobacco, and vaping devices, such as e-cigarettes. If you need help quitting, ask your health care provider. Follow any instructions from your health care provider about diet. You may be instructed to limit foods that contain iodine . Keep all follow-up visits. You will need to have blood tests regularly so that your health care provider can monitor your condition. Where to find more information [employer] and Digestive [employer] Kidney Diseases: [email or web address] Contact a health care provider if: Your symptoms do not get better with treatment. You have a fever.  You have abdominal pain. You feel dizzy. You are taking thyroid   hormone replacement medicine and: You have symptoms of depression. You feel like you are tired all the time. You gain weight. Get help right away if: You have sudden, unexplained confusion or other mental changes. You have chest pain. You have fast or irregular heartbeats (palpitations). You have difficulty breathing. These symptoms may be an emergency. Get help right away. Call 911. Do not wait to see if the symptoms will go away. Do not drive yourself to the hospital. Summary The thyroid  gland is a small gland located in the lower front part of the neck, just in front of the windpipe. Hyperthyroidism is when the thyroid  gland is too active and produces too much of a hormone called thyroxine. The most common cause is Graves' disease, a disorder in which your immune system attacks the thyroid  gland. Hyperthyroidism can cause various symptoms, such as unexplained weight loss, nervousness, inability to tolerate heat, or changes in your heartbeat. Treatment may include medicine to reduce the amount of thyroid  hormone your body makes, radioiodine therapy, surgery, or medicines to manage symptoms. This information is not intended to replace advice given to you by your health care provider. Make sure you discuss any questions you have with your health care provider. Document Revised: 04/23/2021 Document Reviewed: 04/23/2021 Elsevier Patient Education  2024 [employer].

## 2023-04-19 NOTE — Progress Notes (Signed)
 Patient ID: Rebecca [name], female   DOB: [birth year], 83 y.o.   MRN: [id number]  HPI  Rebecca [name] is a pleasant 83 y.o.-year-old female, referred by her PCP, Dr. Amon, for evaluation and management of thyrotoxicosis.   Patient was recently found to have an undetectable TSH on labs checked for weight loss. She describes that she started to lose weight when her husband was sick with multiple myeloma in 02-24-2020.  He passed away 12-25-2019.  They were married 62 years.  She is, understandably, still grieving.  In the last approximately 6 months, she lost 18 pounds, developed tremors, also developed significant fatigue and weakness to the point that she cannot lift arms to comb her hair (also has shoulder pain), pruritus over her body and especially hair, and also diarrhea.  She also has anxiety but no palpitations or insomnia.  She does describe shortness of breath when she lays down at night, but without associated palpitations or chest pain.  She has significant right shoulder pain for which surgery is scheduled at the end of the month.  She is very nervous that she may not be able to go ahead with the surgery due to her thyroid  condition.  I reviewed pt's thyroid  tests: Lab Results  Component Value Date   TSH <0.01 Repeated and verified X2. (L) 04/12/2023   TSH 2.86 02/10/2021   TSH 2.71 09/10/2020   TSH 2.41 07/24/2017   TSH 1.32 05/11/2015   TSH 3.04 03/26/2014   TSH 2.05 11/24/2010   TSH 2.03 11/06/2008   TSH 1.91 06/26/2006   FREET4 1.1 02/10/2021   T3FREE 2.8 02/10/2021   Antithyroid antibodies: No results found for: TSI  She also has a history of a thyroid  nodule per review of available records:  Carotid ultrasound (05/23/2008):  A tiny incidental low density nodule is detected in the right lobe  of the thyroid  gland.  This is well under 1 cm in diameter and  likely benign.   Pt denies: - feeling nodules in neck - dysphagia - choking  She has: - hoarseness - voice  quivering  Pt does have a FH of thyroid  ds. - daughter thyroid  nodule - had thyroid  sx. No FH of thyroid  cancer. No h/o radiation tx to head or neck. No seaweed or kelp, no recent contrast studies. No herbal supplements. No Biotin use except for multivitamin.  She has a history of shoulder pain and sees Beverley Millman.  She has reverse shoulder arthroplasty pending for 05/09/2023.  She has been getting steroid (Kenalog) injections, the latest being in shoulder on 03/22/2023.  Pt. also has a history of hyperlipidemia, transaminitis (with normal recent LFTs 04/12/2023),osteoporosis (on Prolia , first dose 11/17/2022), thrombocytopenia (with normal white blood cell count 04/12/2023).  She also has a history of back surgery and partial bilateral knee replacements.  ROS: Constitutional: + see HPI, + both heat and cold intolerance. Eyes: no blurry vision, + xerophthalmia ENT: no sore throat, + see HPI, + hypoacusis Cardiovascular: no CP/palpitations/leg swelling, but + SOB Respiratory: no cough, +SOB Gastrointestinal: + N/no V/+ D/+ C Musculoskeletal: + muscle/joint aches Skin: no rashes Neurological: + tremors/no numbness/tingling/dizziness Psychiatric: no depression/anxiety  Past Medical History:  Diagnosis Date   Anxiety    Arthritis    degenerative lumbar spine    Chronic neck pain    Closed fracture of base of fifth metatarsal bone 01/2018   Left   Complication of anesthesia    at one time my blood pressure dropped -lumbar -  on bp med at that time   Depression    Dysphagia    esophageal stretched 9-14    History of blood transfusion 2009   post - lumbar fusion    Hyperlipidemia    Hypertension    No longer takes med.   Osteoarthritis of left shoulder region    Mild, Dr. Josefina   Osteoporosis    Past Surgical History:  Procedure Laterality Date   ABDOMINAL HYSTERECTOMY     ANTERIOR CERVICAL DECOMP/DISCECTOMY FUSION N/A 11/20/2014   Procedure: ANTERIOR CERVICAL  DECOMPRESSION/DISCECTOMY FUSION 2 LEVELS;  Surgeon: Oneil Priestly, MD;  Location: [facility] OR;  Service: Orthopedics;  Laterality: N/A;  Anterior cervical decompression fusion, cervical 3-4, cervical 4-5 with instrumentation and allograft   arm surgery Bilateral    transposed ulner nerve 10 (dr. Camella)   BACK SURGERY  2009   Dr Onetha SANES SURGERY  01/2014   Dr Onetha   carpal tunnell  2006   bilateral   CHOLECYSTECTOMY     KNEE SURGERY  11/2009   right,  scope; left knee   NECK SURGERY  05/2008   Dr Onetha   OOPHORECTOMY Bilateral    PARTIAL KNEE ARTHROPLASTY  01/17/2012   LEFT KNEE   PARTIAL KNEE ARTHROPLASTY  01/17/2012   Procedure: UNICOMPARTMENTAL KNEE;  Surgeon: Fonda SHAUNNA Josefina, MD;  Location: [facility] OR;  Service: Orthopedics;  Laterality: Left;   PARTIAL KNEE ARTHROPLASTY Right 04/21/2020   Procedure: UNICOMPARTMENTAL KNEE;  Surgeon: Josefina Fonda, MD;  Location: [facility] ORS;  Service: Orthopedics;  Laterality: Right;   RHINOPLASTY  1980   SHOULDER SURGERY Bilateral    x 2/Dr. Carlos   thumb surgery Right 07/2022   Dr Camella   TONSILLECTOMY     Social History   Socioeconomic History   Marital status: Widowed    Spouse name: Lynwood BROCKS.   Number of children: 4   Years of education: Not on file   Highest education level: Not on file  Occupational History   Occupation: stay home   Tobacco Use   Smoking status: Never   Smokeless tobacco: Never  Vaping Use   Vaping status: Never Used  Substance and Sexual Activity   Alcohol use: Not Currently    Comment: rare   Drug use: No   Sexual activity: Not Currently  Other Topics Concern   Not on file  Social History Narrative   Lost husband 2021   Household: pt and son Signe   Children: 3 daughters and 1 son   Social Drivers of Corporate Investment Banker Strain: Low Risk  (10/13/2022)   Overall Financial Resource Strain (CARDIA)    Difficulty of Paying Living Expenses: Not hard at all  Food Insecurity: No Food Insecurity (10/13/2022)    Hunger Vital Sign    Worried About Running Out of Food in the Last Year: Never true    Ran Out of Food in the Last Year: Never true  Transportation Needs: No Transportation Needs (10/13/2022)   PRAPARE - Administrator, Civil Service (Medical): No    Lack of Transportation (Non-Medical): No  Physical Activity: Inactive (10/13/2022)   Exercise Vital Sign    Days of Exercise per Week: 0 days    Minutes of Exercise per Session: 0 min  Stress: No Stress Concern Present (10/13/2022)   [employer] of Occupational Health - Occupational Stress Questionnaire    Feeling of Stress : Not at all  Social Connections: Socially Integrated (10/13/2022)  Social Advertising Account Executive [NHANES]    Frequency of Communication with Friends and Family: More than three times a week    Frequency of Social Gatherings with Friends and Family: More than three times a week    Attends Religious Services: More than 4 times per year    Active Member of [employer] or Organizations: Yes    Attends Engineer, Structural: More than 4 times per year    Marital Status: Married  Catering Manager Violence: Not At Risk (10/13/2022)   Humiliation, Afraid, Rape, and Kick questionnaire    Fear of Current or Ex-Partner: No    Emotionally Abused: No    Physically Abused: No    Sexually Abused: No   Current Outpatient Medications on File Prior to Visit  Medication Sig Dispense Refill   acetaminophen  (TYLENOL ) 500 MG tablet Take 1,000 mg by mouth in the morning and at bedtime.     clonazePAM  (KLONOPIN ) 0.5 MG tablet TAKE 1 TABLET(0.5 MG) BY MOUTH THREE TIMES DAILY AS NEEDED FOR ANXIETY 270 tablet 0   denosumab  (PROLIA ) 60 MG/ML SOSY injection Inject 60 mg into the skin every 6 (six) months.     escitalopram  (LEXAPRO ) 10 MG tablet Take 1 tablet (10 mg total) by mouth daily. 90 tablet 1   loratadine (CLARITIN) 10 MG tablet Take 10 mg by mouth daily.     melatonin 5 MG TABS Take 5 mg by mouth.     Multiple  Vitamin (MULTIVITAMIN) tablet Take 1 tablet by mouth daily. Reported on 08/25/2015     Propylene Glycol (SYSTANE BALANCE) 0.6 % SOLN Place 1 drop into both eyes daily.     simvastatin  (ZOCOR ) 40 MG tablet Take 1 tablet (40 mg total) by mouth at bedtime. 90 tablet 1   vitamin B-12 (CYANOCOBALAMIN ) 100 MCG tablet Take 100 mcg by mouth daily.     vitamin C (ASCORBIC ACID) 500 MG tablet Take 500 mg by mouth daily.     VITAMIN D  PO Take 1,000 Int'l Units by mouth daily. (Patient not taking: Reported on 04/12/2023)     No current facility-administered medications on file prior to visit.   Allergies  Allergen Reactions   Tape Other (See Comments)    White adhesive tape -blisters   Alendronate Sodium Other (See Comments)    REACTION: jaw pain   Cortizone-10 [Hydrocortisone] Other (See Comments)    Pt received cortizone shot, and became very flushed, and hot, blisters, raw areas around lips & ears    Oxycodone -Acetaminophen  Hives   Penicillins Swelling and Rash   Family History  Problem Relation Age of Onset   Prostate cancer Father    Diabetes Sister    Heart disease Sister        sister and brothers x 2   Kidney disease Sister    Kidney disease Brother    Heart disease Brother    Stroke Daughter    Stomach cancer Maternal Aunt    Colon cancer Neg Hx    Breast cancer Neg Hx    Esophageal cancer Neg Hx    Pancreatic cancer Neg Hx    PE: BP 122/64   Pulse (!) 105   Ht 5' (1.524 m)   Wt 141 lb (64 kg)   SpO2 99%   BMI 27.54 kg/m  Wt Readings from Last 10 Encounters:  04/19/23 141 lb (64 kg)  04/12/23 141 lb 6 oz (64.1 kg)  12/28/22 161 lb 4 oz (73.1 kg)  12/13/22 159 lb  9.6 oz (72.4 kg)  11/17/22 157 lb 1.9 oz (71.3 kg)  09/21/22 159 lb (72.1 kg)  05/19/22 153 lb (69.4 kg)  03/23/22 152 lb 2 oz (69 kg)  11/18/21 142 lb 12.8 oz (64.8 kg)  09/13/21 140 lb (63.5 kg)   Constitutional: normal weight, in NAD Eyes:  EOMI, no exophthalmos, + mild B stare ENT: no neck masses, no  cervical lymphadenopathy Cardiovascular: No tachycardia at the time of the physical exam, RR, No MRG Respiratory: CTA B Musculoskeletal: no deformities Skin:no rashes Neurological: + tremor with outstretched hands  ASSESSMENT: 1. Thyrotoxicosis  2.  Thyroid  nodule  PLAN:  1. Patient with a recently found low TSH, with thyrotoxic sxs: weight loss (18 lbs in last 6 mo), along with increased fatigue, weakness, hyperdefecation, tremors.  - she is not on amiodarone, lithium, excessive doses of biotin, but she got a steroid injection in shoulder approximately 3 weeks before a TSH returned abnormal last month.  We discussed that steroids could cause low TSH, however, in her case, due to the presence of the above symptoms and the clear clinical picture, I do suspect thyrotoxicosis.  Reviewing the chart, her thyroid  dysfunction started sometimes after 02/10/2021 - all previous TSH levels have been normal, going back to 2008. - We discussed that other possible causes of thyrotoxicosis are:  Graves ds  - I do suspect this for her due to the presence of the mild stare Thyroiditis toxic multinodular goiter/ toxic adenoma (I cannot feel nodules at palpation of her thyroid , but she has a documented tiny thyroid  nodule). - will check the TSH, fT3 and fT4 and also add thyroid  stimulating antibodies to screen for Graves' disease.  - If the tests remain abnormal, we may need an uptake and scan to differentiate between the 3 above possible etiologies  - we discussed about possible modalities of treatment for the above conditions, to include methimazole  use, radioactive iodine  ablation or (last resort) surgery.  We discussed about possible side effects from methimazole  to include itching, elevated LFTs and decreased white blood cell count.  The latter 2 were tested and they were normal recently.  We are impressed to get her thyroid  test is well under control as possible before her shoulder surgery. - we may need to  do thyroid  ultrasound depending on the results of the uptake and scan (if a cold nodule is present) - pt's heart rate was elevated on arrival, but not at the time of the physical exam.  For now, we will try to see if we could hold off adding beta-blockers.  - no signs of active Graves' ophthalmopathy: she does not have any double vision, blurry vision, eye pain, but she does have xerophthalmia and also, mild stare - RTC in 3 months, but likely sooner for repeat labs  2.  Thyroid  nodule -Subcentimeter, per review of the carotid ultrasound reports from 2010 -No neck compression symptoms or masses felt on palpation of her neck today -No further investigation is needed for this  Orders Placed This Encounter  Procedures   TSH   T4, free   T3, free   Thyroid  stimulating immunoglobulin   Lela Fendt, MD PhD [facility] Endocrinology

## 2023-04-20 MED ORDER — DENOSUMAB 60 MG/ML ~~LOC~~ SOSY
60.0000 mg | PREFILLED_SYRINGE | Freq: Once | SUBCUTANEOUS | Status: AC
  Administered 2023-05-17: 60 mg via SUBCUTANEOUS

## 2023-04-20 NOTE — Addendum Note (Signed)
 Addended by: Marylou Sobers D on: 04/20/2023 11:22 AM   Modules accepted: Orders

## 2023-04-21 ENCOUNTER — Telehealth: Payer: Self-pay

## 2023-04-21 ENCOUNTER — Telehealth: Payer: Self-pay | Admitting: *Deleted

## 2023-04-21 MED ORDER — METHIMAZOLE 5 MG PO TABS: 5.0000 mg | ORAL_TABLET | Freq: Two times a day (BID) | ORAL | 1 refills | Status: DC

## 2023-04-21 NOTE — Telephone Encounter (Signed)
   Name: Rebecca [name] [facility]  DOB: 11-29-1940  MRN: [id number]  Primary Cardiologist: None   Preoperative team, please contact this patient and set up a phone call appointment for further preoperative risk assessment. Please obtain consent and complete medication review. Thank you for your help.  I confirm that guidance regarding antiplatelet and oral anticoagulation therapy has been completed and, if necessary, noted below.  None requested.  I also confirmed the patient resides in the state of [place] . As per [facility] Medical Board telemedicine laws, the patient must reside in the state in which the provider is licensed.   Josefa HERO [place], NP 04/21/2023, 3:09 PM [facility]

## 2023-04-21 NOTE — Telephone Encounter (Signed)
 Copied from CRM [id number]. Topic: Referral - Status >> Apr 21, 2023 12:23 PM Mercedes MATSU wrote: Reason for CRM: Rebecca [name] from [facility] (Orthopedic [facility]) called in regards to her sending over a clearance for Dr. Amon. He said she was cleared for surgery but he said that he would like for her to see cardiology. Rebecca [name] wants to know if he is going to put in a referral for her to be seen by cardio, or what should be the next steps. Please give her a call at [phone].

## 2023-04-21 NOTE — Telephone Encounter (Signed)
   Pre-operative Risk Assessment    Patient Name: Rebecca [name] [facility]  DOB: [birth year] MRN: [id number]   Date of last office visit: 12/13/2022 Date of next office visit: N/A   Request for Surgical Clearance    Procedure:   RIGHT REVERSE TOTAL SHOULDER REPLACEMENT  Date of Surgery:  Clearance 05/09/23                                Surgeon:  FONDA SHAUNNA OLMSTED Surgeon's Group or Practice Name:  [facility] Phone number:  [phone] Fax number:  [phone]   Type of Clearance Requested:   - Medical    Type of Anesthesia:   GENERAL WITH INTERSCALENE BLOCK   Additional requests/questions:    [facility]   04/21/2023, 1:28 PM

## 2023-04-21 NOTE — Progress Notes (Signed)
 Called the patient and explained the results.  TSH is not detectable and the free T4 and free T3 are less than twice the upper limit of the target range.  The TSI antibodies are not back yet, but since we are in a hurry to normalize her TFTs enough so she could go ahead with the shoulder surgery in 2.5 weeks, and due to the high suspicion for Graves' disease, I recommended to start methimazole  5 mg twice a day with meals and to come back for labs on 05/04/2023.  At that time, if the free T4 and free T3 levels have improved, I could clear her for surgery.  She agrees with this plan.  Can you please call the patient to add her to the lab schedule for 05/04/2023?

## 2023-04-21 NOTE — Telephone Encounter (Signed)
 Pt has been scheduled tele preop appt 05/04/23. Med rec and consent are done.

## 2023-04-21 NOTE — Telephone Encounter (Signed)
 Spoke w/ Rebecca [name]- informed of hyperthyroidism noted on her labs on 04/12/23 when Pt saw Dr. Amon- informed that Pt saw Endo on 04/19/23- per Dr. Ara note- they are still waiting on some of her lab results but will need to be on methimazole  (if started) for at least 2 weeks then recheck labs. Informed I doubt she will be optimized in time for her surgery that is scheduled for 05/09/23. Rebecca [name] requested that we fax the form back with this information. We can complete a new form once she is cleared.    Informed Rebecca [name]- Pt just needs to call cardiology to schedule visit for clearance.

## 2023-04-21 NOTE — Telephone Encounter (Signed)
 Pt not cleared at this time. Form faxed back to Attn: Valinda Gault at [employer] at [phone]. Form sent for scanning.

## 2023-04-21 NOTE — Telephone Encounter (Signed)
 Pt has been scheduled tele preop appt 05/04/23.

## 2023-04-25 ENCOUNTER — Encounter (HOSPITAL_COMMUNITY): Payer: Self-pay

## 2023-04-25 ENCOUNTER — Other Ambulatory Visit: Payer: Self-pay

## 2023-04-25 ENCOUNTER — Telehealth: Payer: Self-pay

## 2023-04-25 LAB — THYROID STIMULATING IMMUNOGLOBULIN: TSI: 407 % baseline — ABNORMAL HIGH (ref <140)

## 2023-04-25 LAB — T4, FREE: Free T4: 2.7 ng/dL — ABNORMAL HIGH (ref 0.8–1.8)

## 2023-04-25 LAB — T3, FREE: T3, Free: 7.7 pg/mL — ABNORMAL HIGH (ref 2.3–4.2)

## 2023-04-25 LAB — TSH: TSH: 0.01 m[IU]/L — ABNORMAL LOW (ref 0.40–4.50)

## 2023-04-25 NOTE — Patient Instructions (Signed)
 SURGICAL WAITING ROOM VISITATION  Patients having surgery or a procedure may have no more than 2 support people in the waiting area - these visitors may rotate.    Children under the age of 6 must have an adult with them who is not the patient.  Due to an increase in RSV and influenza rates and associated hospitalizations, children ages 45 and under may not visit patients in [facility] hospitals.  Visitors with respiratory illnesses are discouraged from visiting and should remain at home.  If the patient needs to stay at the hospital during part of their recovery, the visitor guidelines for inpatient rooms apply. Pre-op nurse will coordinate an appropriate time for 1 support person to accompany patient in pre-op.  This support person may not rotate.    Please refer to the [facility] website for the visitor guidelines for Inpatients (after your surgery is over and you are in a regular room).       Your procedure is scheduled on: 05-09-23    Report to [facility] Main Entrance    Report to admitting at      0515  AM   Call this number if you have problems the morning of surgery [phone]   Do not eat food :After Midnight.   After Midnight you may have the following liquids until _0430_____ AM/ DAY OF SURGERY  then nothing by mouth  Water Non-Citrus Juices (without pulp, NO RED-Apple, White grape, White cranberry) Black Coffee (NO MILK/CREAM OR CREAMERS, sugar ok)  Clear Tea (NO MILK/CREAM OR CREAMERS, sugar ok) regular and decaf                             Plain Jell-O (NO RED)                                           Fruit ices (not with fruit pulp, NO RED)                                     Popsicles (NO RED)                                                               Sports drinks like Gatorade (NO RED)                  The day of surgery:  Drink ONE (1) Pre-Surgery Clear Ensure BY 0430 AM the morning of surgery. Drink in one sitting. Do not sip.  This  drink was given to you during your hospital  pre-op appointment visit. Nothing else to drink after completing the  Pre-Surgery Clear Ensure .          If you have questions, please contact your surgeon's office.   FOLLOW  ANY ADDITIONAL PRE OP INSTRUCTIONS YOU RECEIVED FROM YOUR SURGEON'S OFFICE!!!     Oral Hygiene is also important to reduce your risk of infection.  Remember - BRUSH YOUR TEETH THE MORNING OF SURGERY WITH YOUR REGULAR TOOTHPASTE  DENTURES WILL BE REMOVED PRIOR TO SURGERY PLEASE DO NOT APPLY "Poly grip" OR ADHESIVES!!!   Do NOT smoke after Midnight   Stop all vitamins and herbal supplements 7 days before surgery.   Take these medicines the morning of surgery with A SIP OF WATER: tylenol, clonazepam if needed, lexapro, loratadine, tapazole, eye drops as usual                              You may not have any metal on your body including hair pins, jewelry, and body piercing             Do not wear make-up, lotions, powders, perfumes/cologne, or deodorant  Do not wear nail polish including gel and S&S, artificial/acrylic nails, or any other type of covering on natural nails including finger and toenails. If you have artificial nails, gel coating, etc. that needs to be removed by a nail salon please have this removed prior to surgery or surgery may need to be canceled/ delayed if the surgeon/ anesthesia feels like they are unable to be safely monitored.   Do not shave  5 days prior to surgery.                Do not bring valuables to the hospital. [facility] IS NOT             RESPONSIBLE   FOR VALUABLES.   Contacts, glasses, dentures or bridgework may not be worn into surgery.     DO NOT BRING YOUR HOME MEDICATIONS TO THE HOSPITAL. PHARMACY WILL DISPENSE MEDICATIONS LISTED ON YOUR MEDICATION LIST TO YOU DURING YOUR ADMISSION IN THE HOSPITAL!    Patients discharged on the day of surgery will not be allowed to drive home.   Someone NEEDS to stay with you for the first 24 hours after anesthesia.   Special Instructions: Bring a copy of your healthcare power of attorney and living will documents the day of surgery if you haven't scanned them before.              Please read over the following fact sheets you were given: IF YOU HAVE QUESTIONS ABOUT YOUR PRE-OP INSTRUCTIONS PLEASE CALL [phone]    If you test positive for Covid or have been in contact with anyone that has tested positive in the last 10 days please notify you surgeon.   [facility]- Preparing for Total Shoulder Arthroplasty    Before surgery, you can play an important role. Because skin is not sterile, your skin needs to be as free of germs as possible. You can reduce the number of germs on your skin by using the following products. Benzoyl Peroxide Gel Reduces the number of germs present on the skin Applied twice a day to shoulder area starting two days before surgery    ==================================================================  Please follow these instructions carefully:  BENZOYL PEROXIDE 5% GEL  Please do not use if you have an allergy to benzoyl peroxide.   If your skin becomes reddened/irritated stop using the benzoyl peroxide.  Starting two days before surgery, apply as follows: Apply benzoyl peroxide in the morning and at night. Apply after taking a shower. If you are not taking a shower clean entire shoulder front, back, and side along with the armpit with a clean wet washcloth.  Place a quarter-sized dollop on your shoulder and rub in  thoroughly, making sure to cover the front, back, and side of your shoulder, along with the armpit.   2 days before ____ AM   ____ PM              1 day before ____ AM   ____ PM                         Do this twice a day for two days.  (Last application is the night before surgery, AFTER using the CHG soap as described below).  Do NOT apply benzoyl peroxide gel on the day of surgery.       Pre-operative 5 CHG Bath Instructions   You can play a key role in reducing the risk of infection after surgery. Your skin needs to be as free of germs as possible. You can reduce the number of germs on your skin by washing with CHG (chlorhexidine gluconate) soap before surgery. CHG is an antiseptic soap that kills germs and continues to kill germs even after washing.   DO NOT use if you have an allergy to chlorhexidine/CHG or antibacterial soaps. If your skin becomes reddened or irritated, stop using the CHG and notify one of our RNs at [phone].   Please shower with the CHG soap starting 4 days before surgery using the following schedule:     Please keep in mind the following:  DO NOT shave, including legs and underarms, starting the day of your first shower.   You may shave your face at any point before/day of surgery.  Place clean sheets on your bed the day you start using CHG soap. Use a clean washcloth (not used since being washed) for each shower. DO NOT sleep with pets once you start using the CHG.   CHG Shower Instructions:  If you choose to wash your hair and private area, wash first with your normal shampoo/soap.  After you use shampoo/soap, rinse your hair and body thoroughly to remove shampoo/soap residue.  Turn the water OFF and apply about 3 tablespoons (45 ml) of CHG soap to a CLEAN washcloth.  Apply CHG soap ONLY FROM YOUR NECK DOWN TO YOUR TOES (washing for 3-5 minutes)  DO NOT use CHG soap on face, private areas, open wounds, or sores.  Pay special attention to the area where your surgery is being performed.  If you are having back surgery, having someone wash your back for you may be helpful. Wait 2 minutes after CHG soap is applied, then you may rinse off the CHG soap.  Pat dry with a clean towel  Put on clean clothes/pajamas   If you choose to wear lotion, please use ONLY the CHG-compatible lotions on the back of this paper.     Additional instructions for  the day of surgery: DO NOT APPLY any lotions, deodorants, cologne, or perfumes.   Put on clean/comfortable clothes.  Brush your teeth.  Ask your nurse before applying any prescription medications to the skin.      CHG Compatible Lotions   Aveeno Moisturizing lotion  Cetaphil Moisturizing Cream  Cetaphil Moisturizing Lotion  Clairol Herbal Essence Moisturizing Lotion, Dry Skin  Clairol Herbal Essence Moisturizing Lotion, Extra Dry Skin  Clairol Herbal Essence Moisturizing Lotion, Normal Skin  Curel Age Defying Therapeutic Moisturizing Lotion with Alpha Hydroxy  Curel Extreme Care Body Lotion  Curel Soothing Hands Moisturizing Hand Lotion  Curel Therapeutic Moisturizing Cream, Fragrance-Free  Curel Therapeutic Moisturizing Lotion, Fragrance-Free  Curel Therapeutic  Moisturizing Lotion, Original Formula  Eucerin Daily Replenishing Lotion  Eucerin Dry Skin Therapy Plus Alpha Hydroxy Crme  Eucerin Dry Skin Therapy Plus Alpha Hydroxy Lotion  Eucerin Original Crme  Eucerin Original Lotion  Eucerin Plus Crme Eucerin Plus Lotion  Eucerin TriLipid Replenishing Lotion  Keri Anti-Bacterial Hand Lotion  Keri Deep Conditioning Original Lotion Dry Skin Formula Softly Scented  Keri Deep Conditioning Original Lotion, Fragrance Free Sensitive Skin Formula  Keri Lotion Fast Absorbing Fragrance Free Sensitive Skin Formula  Keri Lotion Fast Absorbing Softly Scented Dry Skin Formula  Keri Original Lotion  Keri Skin Renewal Lotion Keri Silky Smooth Lotion  Keri Silky Smooth Sensitive Skin Lotion  Nivea Body Creamy Conditioning Oil  Nivea Body Extra Enriched Lotion  Nivea Body Original Lotion  Nivea Body Sheer Moisturizing Lotion Nivea Crme  Nivea Skin Firming Lotion  NutraDerm 30 Skin Lotion  NutraDerm Skin Lotion  NutraDerm Therapeutic Skin Cream  NutraDerm Therapeutic Skin Lotion  ProShield Protective Hand Cream  Provon moisturizing lotion   Incentive Spirometer  An incentive  spirometer is a tool that can help keep your lungs clear and active. This tool measures how well you are filling your lungs with each breath. Taking long deep breaths may help reverse or decrease the chance of developing breathing (pulmonary) problems (especially infection) following: A long period of time when you are unable to move or be active. BEFORE THE PROCEDURE  If the spirometer includes an indicator to show your best effort, your nurse or respiratory therapist will set it to a desired goal. If possible, sit up straight or lean slightly forward. Try not to slouch. Hold the incentive spirometer in an upright position. INSTRUCTIONS FOR USE  Sit on the edge of your bed if possible, or sit up as far as you can in bed or on a chair. Hold the incentive spirometer in an upright position. Breathe out normally. Place the mouthpiece in your mouth and seal your lips tightly around it. Breathe in slowly and as deeply as possible, raising the piston or the ball toward the top of the column. Hold your breath for 3-5 seconds or for as long as possible. Allow the piston or ball to fall to the bottom of the column. Remove the mouthpiece from your mouth and breathe out normally. Rest for a few seconds and repeat Steps 1 through 7 at least 10 times every 1-2 hours when you are awake. Take your time and take a few normal breaths between deep breaths. The spirometer may include an indicator to show your best effort. Use the indicator as a goal to work toward during each repetition. After each set of 10 deep breaths, practice coughing to be sure your lungs are clear. If you have an incision (the cut made at the time of surgery), support your incision when coughing by placing a pillow or rolled up towels firmly against it. Once you are able to get out of bed, walk around indoors and cough well. You may stop using the incentive spirometer when instructed by your caregiver.  RISKS AND COMPLICATIONS Take your time  so you do not get dizzy or light-headed. If you are in pain, you may need to take or ask for pain medication before doing incentive spirometry. It is harder to take a deep breath if you are having pain. AFTER USE Rest and breathe slowly and easily. It can be helpful to keep track of a log of your progress. Your caregiver can provide you with a simple table  to help with this. If you are using the spirometer at home, follow these instructions: SEEK MEDICAL CARE IF:  You are having difficultly using the spirometer. You have trouble using the spirometer as often as instructed. Your pain medication is not giving enough relief while using the spirometer. You develop fever of 100.5 F (38.1 C) or higher. SEEK IMMEDIATE MEDICAL CARE IF:  You cough up bloody sputum that had not been present before. You develop fever of 102 F (38.9 C) or greater. You develop worsening pain at or near the incision site. MAKE SURE YOU:  Understand these instructions. Will watch your condition. Will get help right away if you are not doing well or get worse. Document Released: 07/11/2006 Document Revised: 05/23/2011 Document Reviewed: 09/11/2006 [facility] Patient Information 2014 [place], [place].   ________________________________________________________________________

## 2023-04-25 NOTE — Telephone Encounter (Signed)
Prolia VOB initiated via [email or web address]  Next Prolia inj DUE: 05/17/23

## 2023-04-25 NOTE — Progress Notes (Addendum)
Anesthesia Review:preop cardiac tele appt 05-04-23  QMV:HQIO Paz 04/12/23- preop clearance  Cardiologist : Aneta Mins nahser LVO 12/13/22 has preop tele visit 05-04-23 Onc- DR Prince Solian- LOV 11/17/22  Endocrinology-LOV 04-19-23 Carlus Pavlov, MD   Chest x-ray :04-12-23  epic EKG :09-21-22 epic Echo : Stress test: Cardiac Cath :   Activity level:   Sleep Study/ CPAP :  Fasting Blood Sugar :      / Checks Blood Sugar -- times a day:   Blood Thinner/ Instructions /Last Dose: ASA / Instructions/ Last Dose :    04/12/23- CBC/Diff and CMP

## 2023-04-26 ENCOUNTER — Encounter (HOSPITAL_COMMUNITY)
Admission: RE | Admit: 2023-04-26 | Discharge: 2023-04-26 | Disposition: A | Discharge status: Home / Self Care | Payer: PPO | Source: Ambulatory Visit | Attending: Orthopedic Surgery | Admitting: Orthopedic Surgery

## 2023-04-26 DIAGNOSIS — Z01818 Encounter for other preprocedural examination: Secondary | ICD-10-CM

## 2023-04-28 ENCOUNTER — Other Ambulatory Visit (HOSPITAL_COMMUNITY): Payer: Self-pay

## 2023-04-28 NOTE — Telephone Encounter (Signed)
Pt ready for scheduling for PROLIA on or after : 05/17/23  Out-of-pocket cost due at time of visit: $332  Number of injection/visits approved: ---  Primary: [employer] Prolia co-insurance: 20% Admin fee co-insurance: 0%  Secondary: --- Prolia co-insurance:  Admin fee co-insurance:   Medical Benefit Details: Date Benefits were checked: 04/26/23 Deductible: NO/ Coinsurance: 20%/ Admin Fee: 0%  Prior Auth: N/A PA# Expiration Date:   # of doses approved:  Pharmacy benefit: Copay $250 If patient wants fill through the pharmacy benefit please send prescription to: [employer], and include estimated need by date in rx notes. Pharmacy will ship medication directly to the office.  Patient NOT eligible for Prolia Copay Card. Copay Card can make patient's cost as little as $25. Link to apply: [email or web address]://www.[email or web address]/copay  ** This summary of benefits is an estimation of the patient's out-of-pocket cost. Exact cost may very based on individual plan coverage.

## 2023-05-02 ENCOUNTER — Other Ambulatory Visit: Payer: Self-pay

## 2023-05-02 NOTE — Telephone Encounter (Signed)
 Pt ready for scheduling for PROLIA on or after : 05/17/23  Out-of-pocket cost due at time of visit: $332  Number of injection/visits approved: ---  Primary: [employer] Prolia co-insurance: 20% Admin fee co-insurance: 0%  Secondary: --- Prolia co-insurance:  Admin fee co-insurance:   Medical Benefit Details: Date Benefits were checked: 04/26/23 Deductible: NO/ Coinsurance: 20%/ Admin Fee: 0%  Prior Auth: N/A PA# Expiration Date:   # of doses approved:  Pharmacy benefit: Copay $250 If patient wants fill through the pharmacy benefit please send prescription to: [employer], and include estimated need by date in rx notes. Pharmacy will ship medication directly to the office.  Patient NOT eligible for Prolia Copay Card. Copay Card can make patient's cost as little as $25. Link to apply: [email or web address]://www.[email or web address]  ** This summary of benefits is an estimation of the patient's out-of-pocket cost. Exact cost may very based on individual plan coverage.

## 2023-05-03 ENCOUNTER — Other Ambulatory Visit: Payer: Self-pay

## 2023-05-03 ENCOUNTER — Other Ambulatory Visit (HOSPITAL_COMMUNITY): Payer: Self-pay

## 2023-05-03 MED ORDER — DENOSUMAB 60 MG/ML ~~LOC~~ SOSY
60.0000 mg | PREFILLED_SYRINGE | SUBCUTANEOUS | 0 refills | Status: DC
  Filled 2023-05-03: qty 1, 180d supply, fill #0

## 2023-05-03 NOTE — Addendum Note (Signed)
 Addended by: Thelma Barge D on: 05/03/2023 02:45 PM   Modules accepted: Orders

## 2023-05-03 NOTE — Progress Notes (Signed)
 Specialty Pharmacy Refill Coordination Note  Rebecca [name] is a 82 y.o. female contacted today regarding refills of specialty medication(s) Denosumab (PROLIA)   Patient requested Courier to Provider Office   Delivery date: 05/10/23   Verified address: [facility] [address] [facility] [address] [place],  [place] [address]   Medication will be filled on 05/09/23.

## 2023-05-03 NOTE — Telephone Encounter (Signed)
 Patient will get from pharmacy.  Rx sent to [facility] and patient notified to look out for a call from pharmacy.

## 2023-05-03 NOTE — Progress Notes (Unsigned)
 Virtual Visit via Telephone Note   Because of Mariabelen Pressly [facility] co-morbid illnesses, she is at least at moderate risk for complications without adequate follow up.  This format is felt to be most appropriate for this patient at this time.  Due to technical limitations with video connection Web designer), today's appointment will be conducted as an audio only telehealth visit, and Rebecca [name] [facility] verbally agreed to proceed in this manner.   All issues noted in this document were discussed and addressed.  No physical exam could be performed with this format.  Evaluation Performed:  Preoperative cardiovascular risk assessment _____________   Date:  05/04/2023   Patient ID:  Rebecca [name], DOB March 26, 1940, MRN [id number] Patient Location:  Home Provider location:   Office  Primary Care Provider:  Wanda Plump, MD Primary Cardiologist:  Dr. Elease Hashimoto   Chief Complaint / Patient Profile   83 y.o. y/o female with a h/o orthostatic hypotension who is pending right reverse total shoulder replacement on date to be determined, by Dr. Teryl Lucy and presents today for telephonic preoperative cardiovascular risk assessment.  History of Present Illness    Rebecca [name] is a 83 y.o. female who presents via audio/video conferencing for a telehealth visit today.  Pt was last seen in cardiology clinic on 12/13/2018 for by by Dr. Elease Hashimoto.  At that time Rebecca [name] was doing well and could be seen on a as needed basis.  The patient is now pending procedure as outlined above. Since her last visit, she has been diagnosed with Graves disease. She denies chest pain, shortness of breath, palpitations or heart racing. 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   Past Medical History    Past Medical History:  Diagnosis Date   Anxiety    Arthritis    degenerative lumbar spine    Chronic neck pain    Closed fracture of base of fifth metatarsal bone 01/2018   Left   Complication of  anesthesia    at one time my blood pressure dropped -lumbar - on bp med at that time   Depression    Dysphagia    esophageal stretched 9-14    History of blood transfusion 2009   post - lumbar fusion    Hyperlipidemia    Hypertension    No longer takes med.   Osteoarthritis of left shoulder region    Mild, Dr. Dion Saucier   Osteoporosis    Past Surgical History:  Procedure Laterality Date   ABDOMINAL HYSTERECTOMY     ANTERIOR CERVICAL DECOMP/DISCECTOMY FUSION N/A 11/20/2014   Procedure: ANTERIOR CERVICAL DECOMPRESSION/DISCECTOMY FUSION 2 LEVELS;  Surgeon: Estill Bamberg, MD;  Location: [facility] OR;  Service: Orthopedics;  Laterality: N/A;  Anterior cervical decompression fusion, cervical 3-4, cervical 4-5 with instrumentation and allograft   arm surgery Bilateral    transposed ulner nerve 10 (dr. Amanda Pea)   BACK SURGERY  2009   Dr Robbi Garter SURGERY  01/2014   Dr Wynetta Emery   carpal tunnell  2006   bilateral   CHOLECYSTECTOMY     KNEE SURGERY  11/2009   right,  scope; left knee   NECK SURGERY  05/2008   Dr Wynetta Emery   OOPHORECTOMY Bilateral    PARTIAL KNEE ARTHROPLASTY  01/17/2012   LEFT KNEE   PARTIAL KNEE ARTHROPLASTY  01/17/2012   Procedure: UNICOMPARTMENTAL KNEE;  Surgeon: Eulas Post, MD;  Location: [facility] OR;  Service: Orthopedics;  Laterality: Left;   PARTIAL KNEE  ARTHROPLASTY Right 04/21/2020   Procedure: UNICOMPARTMENTAL KNEE;  Surgeon: Teryl Lucy, MD;  Location: [facility] ORS;  Service: Orthopedics;  Laterality: Right;   RHINOPLASTY  1980   SHOULDER SURGERY Bilateral    x 2/Dr. Leslee Home   thumb surgery Right 07/2022   Dr Amanda Pea   TONSILLECTOMY      Allergies  Allergies  Allergen Reactions   Tape Itching and Other (See Comments)    White adhesive tape -blisters   Alendronate Sodium Other (See Comments)    REACTION: jaw pain   Oxycodone-Acetaminophen Hives   Cortisone     Pt received cortisone shot, and became very flushed, and hot, blisters, raw areas around lips & ears     Penicillins Swelling and Rash    Home Medications    Prior to Admission medications   Medication Sig Start Date End Date Taking? Authorizing Provider  acetaminophen (TYLENOL) 500 MG tablet Take 1,000 mg by mouth in the morning and at bedtime.    [provider]  clonazePAM (KLONOPIN) 0.5 MG tablet TAKE 1 TABLET(0.5 MG) BY MOUTH THREE TIMES DAILY AS NEEDED FOR ANXIETY Patient taking differently: Take 0.5 mg by mouth 2 (two) times daily. May take a third 0.5 mg dose as needed for anxiety 02/08/23   Wanda Plump, MD  denosumab (PROLIA) 60 MG/ML SOSY injection Inject 60 mg into the skin every 6 (six) months.    [provider]  escitalopram (LEXAPRO) 10 MG tablet Take 1 tablet (10 mg total) by mouth daily. 11/04/22   Wanda Plump, MD  loratadine (CLARITIN) 10 MG tablet Take 10 mg by mouth daily.    [provider]  melatonin 5 MG TABS Take 5 mg by mouth at bedtime as needed (sleep).    [provider]  methimazole (TAPAZOLE) 5 MG tablet Take 1 tablet (5 mg total) by mouth 2 (two) times daily. 04/21/23   Carlus Pavlov, MD  Multiple Vitamin (MULTIVITAMIN) tablet Take 1 tablet by mouth daily.    [provider]  Propylene Glycol (SYSTANE BALANCE) 0.6 % SOLN Place 1 drop into both eyes daily.    [provider]  simvastatin (ZOCOR) 40 MG tablet Take 1 tablet (40 mg total) by mouth at bedtime. 11/08/22   Wanda Plump, MD  vitamin B-12 (CYANOCOBALAMIN) 100 MCG tablet Take 100 mcg by mouth daily.    [provider]  vitamin C (ASCORBIC ACID) 500 MG tablet Take 500 mg by mouth daily.    [provider]    Physical Exam    Vital Signs:  Rebecca [name] does not have vital signs available for review today.  Given telephonic nature of communication, physical exam is limited. AAOx3. NAD. Normal affect.  Speech and respirations are unlabored.  Accessory Clinical Findings    None  Assessment & Plan    1.  Preoperative  Cardiovascular Risk Assessment:  According to the Revised Cardiac Risk Index (RCRI), her Perioperative Risk of Major Cardiac Event is (%): 0.9  Her Functional Capacity in METs is: 8.97 according to the [facility] Activity Status Index (DASI).   The patient was advised that if she develops new symptoms prior to surgery to contact our office to arrange for a follow-up visit, and she verbalized understanding.  Therefore, based on ACC/AHA guidelines, patient would be at acceptable risk for the planned procedure without further cardiovascular testing. I will route this recommendation to the requesting party via Epic fax function.   A copy of this note will  be routed to requesting surgeon.  Time:   Today, I have spent 10 minutes with the patient with telehealth technology discussing medical history, symptoms, and management plan.     Joni Reining, NP  05/04/2023, 2:44 PM

## 2023-05-04 ENCOUNTER — Other Ambulatory Visit: Payer: PPO

## 2023-05-04 ENCOUNTER — Ambulatory Visit: Payer: PPO | Attending: Internal Medicine

## 2023-05-04 ENCOUNTER — Other Ambulatory Visit: Payer: Self-pay | Admitting: Internal Medicine

## 2023-05-04 ENCOUNTER — Telehealth: Payer: Self-pay

## 2023-05-04 DIAGNOSIS — E0580 Other thyrotoxicosis without thyrotoxic crisis or storm: Secondary | ICD-10-CM

## 2023-05-04 DIAGNOSIS — Z01810 Encounter for preprocedural cardiovascular examination: Secondary | ICD-10-CM

## 2023-05-04 LAB — TSH: TSH: 0.01 m[IU]/L — ABNORMAL LOW (ref 0.40–4.50)

## 2023-05-04 LAB — T3, FREE: T3, Free: 6.4 pg/mL — ABNORMAL HIGH (ref 2.3–4.2)

## 2023-05-04 LAB — T4, FREE: Free T4: 2 ng/dL — ABNORMAL HIGH (ref 0.8–1.8)

## 2023-05-04 NOTE — Telephone Encounter (Signed)
 Orders Placed This Encounter  Procedures   TSH   T4, free   T3, free

## 2023-05-05 NOTE — Addendum Note (Signed)
 Addended by: Jodelle Gross on: 05/05/2023 10:16 AM   Modules accepted: Level of Service

## 2023-05-09 ENCOUNTER — Other Ambulatory Visit: Payer: Self-pay | Admitting: Internal Medicine

## 2023-05-09 ENCOUNTER — Other Ambulatory Visit: Payer: Self-pay

## 2023-05-09 ENCOUNTER — Ambulatory Visit (HOSPITAL_COMMUNITY): Admit: 2023-05-09 | Payer: PPO | Admitting: Orthopedic Surgery

## 2023-05-09 SURGERY — ARTHROPLASTY, SHOULDER, TOTAL, REVERSE
Anesthesia: General | Site: Shoulder | Laterality: Right

## 2023-05-09 MED ORDER — CLONAZEPAM 0.5 MG PO TABS: 0.5000 mg | ORAL_TABLET | Freq: Three times a day (TID) | ORAL | 0 refills | Status: DC | PRN

## 2023-05-09 NOTE — Telephone Encounter (Signed)
 Requesting: Clonazepam Contract: 03/23/2022 UDS: 09/21/2022 Last Visit: 04/12/2023 Next Visit: 06/28/2023 Last Refill: 02/08/2023 #[id number] with no refill  Please Advise

## 2023-05-09 NOTE — Telephone Encounter (Signed)
 PDMP okay, Rx sent

## 2023-05-09 NOTE — Telephone Encounter (Signed)
 Copied from CRM [id number]. Topic: Clinical - Medication Refill >> May 09, 2023  3:44 PM Mackie Pai E wrote: Most Recent Primary Care Visit:  Provider: Willow Ora E  Department: [facility]  Visit Type: OFFICE VISIT  Date: 04/12/2023  Medication: escitalopram (LEXAPRO) 10 MG tablet                    simvastatin (ZOCOR) 40 MG tablet                    clonazePAM (KLONOPIN) 0.5 MG tablet  Has the patient contacted their pharmacy? No, patient recently switched pharmacies. (Agent: If no, request that the patient contact the pharmacy for the refill. If patient does not wish to contact the pharmacy document the reason why and proceed with request.) (Agent: If yes, when and what did the pharmacy advise?)  Is this the correct pharmacy for this prescription? Yes If no, delete pharmacy and type the correct one.  This is the patient's preferred pharmacy:  [facility] [id number] [place], [place] - [address] [place] [address] Phone: [phone] Fax: [phone]   Has the prescription been filled recently? No  Is the patient out of the medication? No, patient has 7 pills left of her Lexapro, 8 pills left of her Zocor, and 30 pills left of her Klonopin.  Has the patient been seen for an appointment in the last year OR does the patient have an upcoming appointment? Yes  Can we respond through MyChart? No, patient prefer phone call.  Agent: Please be advised that Rx refills may take up to 3 business days. We ask that you follow-up with your pharmacy.

## 2023-05-09 NOTE — Telephone Encounter (Signed)
 Last Fill: Escitalopram: 05/04/23      Simvastatin: 05/04/23     Clonazepam: 02/08/23 270 tabs/0 RF  Last OV: 04/12/23 Next OV: 06/28/23  Routing to provider for review/authorization.

## 2023-05-11 ENCOUNTER — Telehealth: Payer: Self-pay | Admitting: Internal Medicine

## 2023-05-11 ENCOUNTER — Other Ambulatory Visit: Payer: Self-pay

## 2023-05-11 NOTE — Telephone Encounter (Signed)
 Patient called to follow up on a clearance form that was supposed to be sent to Dr Britt Boozer office. She said the office said they still havent received it. Patient said that her son had talked to [place] last week. Please advise. Call back for patient if needed [phone]. (Patient said on same call that it might have just been information about surgery but that the other office hasn't gotten anything yet.)

## 2023-05-11 NOTE — Telephone Encounter (Signed)
 When I last spoke with the son he wanted me to fax over the labs and I have done so through epic .

## 2023-05-17 ENCOUNTER — Other Ambulatory Visit: Payer: Self-pay

## 2023-05-17 ENCOUNTER — Inpatient Hospital Stay: Payer: PPO

## 2023-05-17 ENCOUNTER — Ambulatory Visit: Payer: PPO

## 2023-05-17 ENCOUNTER — Ambulatory Visit: Payer: PPO | Admitting: Hematology & Oncology

## 2023-05-17 DIAGNOSIS — M25511 Pain in right shoulder: Secondary | ICD-10-CM

## 2023-05-17 DIAGNOSIS — M25512 Pain in left shoulder: Secondary | ICD-10-CM

## 2023-05-17 DIAGNOSIS — M810 Age-related osteoporosis without current pathological fracture: Secondary | ICD-10-CM | POA: Diagnosis not present

## 2023-05-17 MED ORDER — DENOSUMAB 60 MG/ML ~~LOC~~ SOSY
60.0000 mg | PREFILLED_SYRINGE | SUBCUTANEOUS | Status: DC
Start: 2023-05-17 — End: 2023-06-26

## 2023-05-17 NOTE — Progress Notes (Signed)
 Pt here today for first Prolia injection per Dr. Drue Novel.   Pt supplied injection. Prolia 60mg /mL injected into L arm.Pt tolerated injection well.   Prolia information given to Pt. Next in 6 months.

## 2023-05-17 NOTE — Addendum Note (Signed)
 Addended by: Mertha Finders on: 05/17/2023 03:42 PM   Modules accepted: Orders

## 2023-05-17 NOTE — Progress Notes (Signed)
CAM ordered

## 2023-05-18 ENCOUNTER — Telehealth: Payer: Self-pay | Admitting: Internal Medicine

## 2023-05-18 ENCOUNTER — Other Ambulatory Visit

## 2023-05-18 DIAGNOSIS — E0580 Other thyrotoxicosis without thyrotoxic crisis or storm: Secondary | ICD-10-CM | POA: Diagnosis not present

## 2023-05-18 NOTE — Addendum Note (Signed)
 Addended byConrad [place] D on: 05/18/2023 08:24 AM   Modules accepted: Orders

## 2023-05-18 NOTE — Telephone Encounter (Signed)
 Spoke w/ Pt- informed per Dr. Charlean Sanfilippo note's from 2/5 and 2/20- she wanted to recheck thyroid functions in 3-4 weeks, which would be this week or next. I informed I'm unsure why she is scheduled for 3/26- per Dr. Charlean Sanfilippo note if thyroid functions have improved she would clear her for surgery at that time. I recommended Pt call endo to set up lab appt. Pt verbalized understanding.    Progress Notes by Carlus Pavlov, MD at 04/19/2023 11:20 AM  Author: Carlus Pavlov, MD Author Type: Physician Filed: 04/21/2023  4:00 PM  Note Status: Signed Cosign: Cosign Not Required Encounter Date: 04/19/2023  Editor: Carlus Pavlov, MD (Physician)             Called the patient and explained the results.  TSH is not detectable and the free T4 and free T3 are less than twice the upper limit of the target range.  The TSI antibodies are not back yet, but since we are in a hurry to normalize her TFTs enough so she could go ahead with the shoulder surgery in 2.5 weeks, and due to the high suspicion for Graves' disease, I recommended to start methimazole 5 mg twice a day with meals and to come back for labs on 05/04/2023.  At that time, if the free T4 and free T3 levels have improved, I could clear her for surgery.  She agrees with this plan.   Can you please call the patient to add her to the lab schedule for 05/04/2023?

## 2023-05-18 NOTE — Telephone Encounter (Signed)
  Pollie Meyer, CMA 05/04/2023  4:46 PM EST Back to Top    Pt son has been notified and new orders have been placed.   Carlus Pavlov, MD 05/04/2023  3:39 PM EST     Can you please call pt.: Patient's thyroid tests appear to be better.  I will suggest to continue the same dose of methimazole for now and repeat the tests (can you please order a TSH, free T4, free T3?) in 3-4 weeks.  I know she postponed her shoulder surgery so we do have some time until we check these again.

## 2023-05-18 NOTE — Telephone Encounter (Signed)
 Copied from CRM [id number]. Topic: Referral - Question >> May 17, 2023  3:54 PM Rebecca [name] wrote: Reason for CRM: Patient states her referral that Dr. Drue Novel put in was incorrect and states the referral needs to be for her right shoulder as this has been an ongoing matter for months. Patient also states she would like it to be with a [facility]. Patient states she is in a lot of pain and feels like she's getting pushed to the side. Please call patient back and advise.

## 2023-05-19 LAB — T3, FREE: T3, Free: 4.7 pg/mL — ABNORMAL HIGH (ref 2.3–4.2)

## 2023-05-19 LAB — TSH: TSH: 0.01 m[IU]/L — ABNORMAL LOW (ref 0.40–4.50)

## 2023-05-19 LAB — T4, FREE: Free T4: 1.4 ng/dL (ref 0.8–1.8)

## 2023-05-22 ENCOUNTER — Telehealth: Payer: Self-pay

## 2023-05-22 NOTE — Telephone Encounter (Signed)
 Per note- Pt has been cleared for surgery by Dr. Elvera Lennox. LMOM at Dr. Shelba Flake office for them to fax over new surgery clearance form for PCP to complete.

## 2023-05-23 NOTE — Telephone Encounter (Signed)
 Surgery clearance letter faxed to Attn: Silvestre Mesi at [facility] at [phone].

## 2023-05-23 NOTE — Telephone Encounter (Signed)
 I haven't received a new form from [employer]. Would you be willing to write a letter saying she has been cleared? Pt keeps calling the office.

## 2023-05-23 NOTE — Telephone Encounter (Signed)
 Patient is cleared from my side

## 2023-05-23 NOTE — Telephone Encounter (Signed)
 Received fax confirmation

## 2023-05-24 ENCOUNTER — Telehealth: Payer: Self-pay | Admitting: Internal Medicine

## 2023-05-24 ENCOUNTER — Other Ambulatory Visit: Payer: Self-pay | Admitting: Internal Medicine

## 2023-05-24 MED ORDER — SIMVASTATIN 40 MG PO TABS: 40.0000 mg | ORAL_TABLET | Freq: Every day | ORAL | 1 refills | Status: DC

## 2023-05-24 MED ORDER — ESCITALOPRAM OXALATE 10 MG PO TABS: 10.0000 mg | ORAL_TABLET | Freq: Every day | ORAL | 1 refills | Status: DC

## 2023-05-24 NOTE — Telephone Encounter (Signed)
 Copied from CRM [id number]. Topic: Clinical - Medication Refill >> May 24, 2023 10:07 AM Truddie Crumble wrote: Most Recent Primary Care Visit:  Provider: Roxanne Gates  Department: [facility]  Visit Type: CLINICAL SUPPORT  Date: 05/17/2023  Medication: escitalopram (LEXAPRO) 10 MG tablet and simvastatin (ZOCOR) 40 MG tablet  Has the patient contacted their pharmacy? No (Agent: If no, request that the patient contact the pharmacy for the refill. If patient does not wish to contact the pharmacy document the reason why and proceed with request.) (Agent: If yes, when and what did the pharmacy advise?)  Is this the correct pharmacy for this prescription? Yes If no, delete pharmacy and type the correct one.  This is the patient's preferred pharmacy:  [facility] [id number] [place], [place] - [address] [place] [address] Phone: [phone] Fax: [phone]  Has the prescription been filled recently? No  Is the patient out of the medication? Yes  Has the patient been seen for an appointment in the last year OR does the patient have an upcoming appointment? Yes  Can we respond through MyChart? Yes  Agent: Please be advised that Rx refills may take up to 3 business days. We ask that you follow-up with your pharmacy. >> May 24, 2023 10:18 AM Nurse Lupe Carney B wrote: Med refills were sent via Rx Request basket

## 2023-05-24 NOTE — Telephone Encounter (Signed)
 Copied from CRM [id number]. Topic: Clinical - Medication Refill >> May 24, 2023 10:07 AM Truddie Crumble wrote: Most Recent Primary Care Visit:  Provider: Roxanne Gates  Department: [facility]  Visit Type: CLINICAL SUPPORT  Date: 05/17/2023  Medication: escitalopram (LEXAPRO) 10 MG tablet and simvastatin (ZOCOR) 40 MG tablet  Has the patient contacted their pharmacy? No (Agent: If no, request that the patient contact the pharmacy for the refill. If patient does not wish to contact the pharmacy document the reason why and proceed with request.) (Agent: If yes, when and what did the pharmacy advise?)  Is this the correct pharmacy for this prescription? Yes If no, delete pharmacy and type the correct one.  This is the patient's preferred pharmacy:  [facility] [id number] [place], [place] - [address] [place] [address] Phone: [phone] Fax: [phone]  Has the prescription been filled recently? No  Is the patient out of the medication? Yes  Has the patient been seen for an appointment in the last year OR does the patient have an upcoming appointment? Yes  Can we respond through MyChart? Yes  Agent: Please be advised that Rx refills may take up to 3 business days. We ask that you follow-up with your pharmacy.

## 2023-06-07 ENCOUNTER — Other Ambulatory Visit: Payer: PPO

## 2023-06-08 ENCOUNTER — Other Ambulatory Visit: Payer: Self-pay | Admitting: Orthopedic Surgery

## 2023-06-08 DIAGNOSIS — Z01818 Encounter for other preprocedural examination: Secondary | ICD-10-CM

## 2023-06-08 DIAGNOSIS — G8929 Other chronic pain: Secondary | ICD-10-CM

## 2023-06-16 ENCOUNTER — Ambulatory Visit
Admission: RE | Admit: 2023-06-16 | Discharge: 2023-06-16 | Disposition: A | Discharge status: Home / Self Care | Source: Ambulatory Visit | Attending: Orthopedic Surgery | Admitting: Orthopedic Surgery

## 2023-06-16 DIAGNOSIS — G8929 Other chronic pain: Secondary | ICD-10-CM

## 2023-06-16 DIAGNOSIS — M19012 Primary osteoarthritis, left shoulder: Secondary | ICD-10-CM | POA: Diagnosis not present

## 2023-06-16 DIAGNOSIS — Z01818 Encounter for other preprocedural examination: Secondary | ICD-10-CM

## 2023-06-16 DIAGNOSIS — M25512 Pain in left shoulder: Secondary | ICD-10-CM | POA: Diagnosis not present

## 2023-06-26 MED ORDER — DENOSUMAB 60 MG/ML ~~LOC~~ SOSY: 60.0000 mg | PREFILLED_SYRINGE | SUBCUTANEOUS | Status: DC

## 2023-06-26 NOTE — Addendum Note (Signed)
 Addended by: Marylou Sobers D on: 06/26/2023 01:54 PM   Modules accepted: Orders

## 2023-06-28 ENCOUNTER — Ambulatory Visit (INDEPENDENT_AMBULATORY_CARE_PROVIDER_SITE_OTHER): Payer: PPO | Admitting: Internal Medicine

## 2023-06-28 ENCOUNTER — Encounter: Payer: Self-pay | Admitting: Internal Medicine

## 2023-06-28 VITALS — BP 136/80 | HR 72 | Temp 97.7°F | Resp 16 | Ht 60.0 in | Wt 136.0 lb

## 2023-06-28 DIAGNOSIS — E079 Disorder of thyroid, unspecified: Secondary | ICD-10-CM

## 2023-06-28 DIAGNOSIS — R634 Abnormal weight loss: Secondary | ICD-10-CM | POA: Diagnosis not present

## 2023-06-28 NOTE — Patient Instructions (Addendum)
   INSTRUCTIONS  FOR TODAY     GO TO THE LAB : Get the blood work     Next office visit for a physical exam by October 2025 Please make an appointment before you leave today

## 2023-06-28 NOTE — Progress Notes (Signed)
 Subjective:    Patient ID: Rebecca [name], female    DOB: 12-14-1940, 83 y.o.   MRN: [id number]  DOS:  06/28/2023 Type of visit - description: Routine checkup  Since the last office visit, saw endocrinology, diagnosed with thyrotoxicosis. On medications. She feels great, denies a rash, no nausea vomiting or diarrhea.   Wt Readings from Last 3 Encounters:  06/28/23 136 lb (61.7 kg)  04/19/23 141 lb (64 kg)  04/12/23 141 lb 6 oz (64.1 kg)     Review of Systems See above   Past Medical History:  Diagnosis Date   Anxiety    Arthritis    degenerative lumbar spine    Chronic neck pain    Closed fracture of base of fifth metatarsal bone 01/2018   Left   Complication of anesthesia    at one time my blood pressure dropped -lumbar - on bp med at that time   Depression    Dysphagia    esophageal stretched 9-14    History of blood transfusion 2009   post - lumbar fusion    Hyperlipidemia    Hypertension    No longer takes med.   Osteoarthritis of left shoulder region    Mild, Dr. Agatha Horsfall   Osteoporosis     Past Surgical History:  Procedure Laterality Date   ABDOMINAL HYSTERECTOMY     ANTERIOR CERVICAL DECOMP/DISCECTOMY FUSION N/A 11/20/2014   Procedure: ANTERIOR CERVICAL DECOMPRESSION/DISCECTOMY FUSION 2 LEVELS;  Surgeon: Virl Grimes, MD;  Location: [facility] OR;  Service: Orthopedics;  Laterality: N/A;  Anterior cervical decompression fusion, cervical 3-4, cervical 4-5 with instrumentation and allograft   arm surgery Bilateral    transposed ulner nerve 10 (dr. Aloha Arnold)   BACK SURGERY  2009   Dr Karleen Overall SURGERY  01/2014   Dr Lamon Pillow   carpal tunnell  2006   bilateral   CHOLECYSTECTOMY     KNEE SURGERY  11/2009   right,  scope; left knee   NECK SURGERY  05/2008   Dr Lamon Pillow   OOPHORECTOMY Bilateral    PARTIAL KNEE ARTHROPLASTY  01/17/2012   LEFT KNEE   PARTIAL KNEE ARTHROPLASTY  01/17/2012   Procedure: UNICOMPARTMENTAL KNEE;  Surgeon: Neville Barbone, MD;  Location:  [facility] OR;  Service: Orthopedics;  Laterality: Left;   PARTIAL KNEE ARTHROPLASTY Right 04/21/2020   Procedure: UNICOMPARTMENTAL KNEE;  Surgeon: Osa Blase, MD;  Location: [facility] ORS;  Service: Orthopedics;  Laterality: Right;   RHINOPLASTY  1980   SHOULDER SURGERY Bilateral    x 2/Dr. Littie Rife   thumb surgery Right 07/2022   Dr Aloha Arnold   TONSILLECTOMY      Current Outpatient Medications  Medication Instructions   acetaminophen  (TYLENOL ) 1,000 mg, 2 times daily   ascorbic acid (VITAMIN C) 500 mg, Daily   clonazePAM  (KLONOPIN ) 0.5 mg, Oral, 3 times daily PRN   escitalopram  (LEXAPRO ) 10 mg, Oral, Daily   loratadine (CLARITIN) 10 mg, Daily   melatonin 5 mg, At bedtime PRN   methimazole  (TAPAZOLE ) 5 mg, Oral, 2 times daily   Multiple Vitamin (MULTIVITAMIN) tablet 1 tablet, Daily   Prolia  60 mg, Subcutaneous, Every 6 months, Dx code: M81.0.  pt has appt on 05/17/23   Propylene Glycol (SYSTANE BALANCE) 0.6 % SOLN 1 drop, Daily   simvastatin  (ZOCOR ) 40 mg, Oral, Daily at bedtime   vitamin B-12 (CYANOCOBALAMIN ) 100 mcg, Daily       Objective:   Physical Exam BP 136/80   Pulse 72  Temp 97.7 F (36.5 C) (Oral)   Resp 16   Ht 5' (1.524 m)   Wt 136 lb (61.7 kg)   SpO2 97%   BMI 26.56 kg/m  General:   Well developed, NAD, BMI noted. HEENT:  Normocephalic . Face symmetric, atraumatic Lungs:  CTA B Normal respiratory effort, no intercostal retractions, no accessory muscle use. Heart: RRR,  no murmur.  Lower extremities: no pretibial edema bilaterally  Skin: Not pale. Not jaundice Neurologic:  alert & oriented X3.  Speech normal, gait appropriate for age and unassisted Psych--  Cognition and judgment appear intact.  Cooperative with normal attention span and concentration.  Behavior appropriate. No anxious or depressed appearing.      Assessment   Assessment  HTN -- on no med since ~ 2009 Hyperlipidemia Anxiety depression: onset Fall 2016 d/t husband health, lost sister  03-2015, lost husband 2021 GI --GERD --Esophageal stricture stretched 2014  --EGD/2017--->  gastritis, bx---reactive gastropathy, no H. pylori. Increased LFTs: 03-2021: US  FAtty liver and  hep serology (-); 09-2021 labs neg (  transferrin saturation, SLE,   ANA, anti-smooth muscle antibodies ) MSK: --DJD; Chronic neck pain ;  spinal stenosis;  multiple surgeries Osteoporosis DEXA osteoprosis 2006  (per pt) DEXA  11-08 normal   DEXA  9-12 mild osteopenia, rec exercise, ca and vit D T score 05-2015:  (-) 1.4 T score 06/19/2017:  (-) 2.1, RX  vit d, exercise , recheck in 2 years T score -1.8   08/2019 Osteoporosis.  Per DEXA 09-2022, discussed Fosamax versus Prolia , Got her first Prolia  11/17/2022. Ao US  03-2015 - no AAA Thrombocytopenia, BM Bx 03-2021  PLAN: Thyrotoxicosis: Was referred to endocrinology due to suppressed TSH, saw endo 04/2023, Dx thyrotoxicosis.  Currently on methimazole ,TSH is still suppressed but overall TFTs improving.  Next steps per Endo.  Check LFTs and CBC. Weight loss: Has lost few more pounds, she feels great, weight loss  probably related to thyroid  disease. Surgical clearance: In need of shoulder surgery, this has been postponed until May. RTC CPX 12-2023

## 2023-06-29 LAB — HEPATIC FUNCTION PANEL
ALT: 36 U/L — ABNORMAL HIGH (ref 0–35)
AST: 44 U/L — ABNORMAL HIGH (ref 0–37)
Albumin: 4.3 g/dL (ref 3.5–5.2)
Alkaline Phosphatase: 61 U/L (ref 39–117)
Bilirubin, Direct: 0.1 mg/dL (ref 0.0–0.3)
Total Bilirubin: 0.7 mg/dL (ref 0.2–1.2)
Total Protein: 6.5 g/dL (ref 6.0–8.3)

## 2023-06-29 LAB — CBC WITH DIFFERENTIAL/PLATELET
Basophils Absolute: 0 10*3/uL (ref 0.0–0.1)
Basophils Relative: 0.6 % (ref 0.0–3.0)
Eosinophils Absolute: 0.3 10*3/uL (ref 0.0–0.7)
Eosinophils Relative: 6.4 % — ABNORMAL HIGH (ref 0.0–5.0)
HCT: 36.5 % (ref 36.0–46.0)
Hemoglobin: 12.1 g/dL (ref 12.0–15.0)
Lymphocytes Relative: 27.2 % (ref 12.0–46.0)
Lymphs Abs: 1.3 10*3/uL (ref 0.7–4.0)
MCHC: 33.2 g/dL (ref 30.0–36.0)
MCV: 93.5 fl (ref 78.0–100.0)
Monocytes Absolute: 0.3 10*3/uL (ref 0.1–1.0)
Monocytes Relative: 7.2 % (ref 3.0–12.0)
Neutro Abs: 2.8 10*3/uL (ref 1.4–7.7)
Neutrophils Relative %: 58.6 % (ref 43.0–77.0)
Platelets: 129 10*3/uL — ABNORMAL LOW (ref 150.0–400.0)
RBC: 3.91 Mil/uL (ref 3.87–5.11)
RDW: 15.1 % (ref 11.5–15.5)
WBC: 4.8 10*3/uL (ref 4.0–10.5)

## 2023-06-30 NOTE — Assessment & Plan Note (Signed)
 Thyrotoxicosis: Was referred to endocrinology due to suppressed TSH, saw endo 04/2023, Dx thyrotoxicosis.  Currently on methimazole ,TSH is still suppressed but overall TFTs improving.  Next steps per Endo.  Check LFTs and CBC. Weight loss: Has lost few more pounds, she feels great, weight loss  probably related to thyroid  disease. Surgical clearance: In need of shoulder surgery, this has been postponed until May. RTC CPX 12-2023

## 2023-07-04 ENCOUNTER — Other Ambulatory Visit: Payer: Self-pay | Admitting: Internal Medicine

## 2023-07-04 DIAGNOSIS — R748 Abnormal levels of other serum enzymes: Secondary | ICD-10-CM

## 2023-07-11 ENCOUNTER — Other Ambulatory Visit

## 2023-07-11 ENCOUNTER — Other Ambulatory Visit: Payer: Self-pay

## 2023-07-11 DIAGNOSIS — E0580 Other thyrotoxicosis without thyrotoxic crisis or storm: Secondary | ICD-10-CM | POA: Diagnosis not present

## 2023-07-11 LAB — HEPATIC FUNCTION PANEL
AG Ratio: 1.8 (calc) (ref 1.0–2.5)
ALT: 33 U/L — ABNORMAL HIGH (ref 6–29)
AST: 43 U/L — ABNORMAL HIGH (ref 10–35)
Albumin: 4.2 g/dL (ref 3.6–5.1)
Alkaline phosphatase (APISO): 56 U/L (ref 37–153)
Bilirubin, Direct: 0.2 mg/dL (ref 0.0–0.2)
Globulin: 2.4 g/dL (ref 1.9–3.7)
Indirect Bilirubin: 0.4 mg/dL (ref 0.2–1.2)
Total Bilirubin: 0.6 mg/dL (ref 0.2–1.2)
Total Protein: 6.6 g/dL (ref 6.1–8.1)

## 2023-07-12 ENCOUNTER — Other Ambulatory Visit: Payer: Self-pay

## 2023-07-12 MED ORDER — METHIMAZOLE 5 MG PO TABS: 5.0000 mg | ORAL_TABLET | Freq: Two times a day (BID) | ORAL | 5 refills | Status: DC

## 2023-07-18 ENCOUNTER — Ambulatory Visit: Payer: PPO | Admitting: Internal Medicine

## 2023-07-18 ENCOUNTER — Encounter: Payer: Self-pay | Admitting: Internal Medicine

## 2023-07-18 VITALS — BP 120/64 | HR 71 | Ht 60.0 in | Wt 135.8 lb

## 2023-07-18 DIAGNOSIS — E0500 Thyrotoxicosis with diffuse goiter without thyrotoxic crisis or storm: Secondary | ICD-10-CM

## 2023-07-18 NOTE — Progress Notes (Signed)
 Patient ID: Rebecca [name], female   DOB: 1940/09/01, 83 y.o.   MRN: [id number]  HPI  Rebecca [name] is a pleasant 83 y.o.-year-old female, initially referred by her PCP, Dr. Neomi Banks, for evaluation and management of Graves' disease.  Last visit 3 months ago.  Interim history: She still has shoulder pain >> will have L shoulder sx first (08/08/2023), then the R shoulder sx. She is otherwise feeling very well, and has a lot of energy.  She did lose 6 pounds since last visit, but she denies tremors, palpitations, heat intolerance.  Reviewed and ended history: In 03/2023, patient was found to have an undetectable TSH on labs checked for weight loss. She describes that she started to lose weight when her husband was sick with multiple myeloma in Aug 06, 2019.  He passed away 01/06/20.  They were married 62 years.   Around that time, she lost 18 pounds in 6 months, developed tremors, significant fatigue and weakness to the point that she could not lift arms to comb her hair (but also had significant shoulder pain), pruritus over her body and especially head, and also diarrhea.  She also had anxiety but no palpitations or insomnia.  She described shortness of breath when lying down at night, but without associated palpitations or chest pain.  At our first visit in 04/2023 she described significant right shoulder pain for which surgery was scheduled at the end of the month.  This got postponed due to her thyroid  tests being abnormal.  In 04/2023 we started methimazole  5 mg twice a day.  She tolerates it well.  I reviewed pt's thyroid  tests: Lab Results  Component Value Date   TSH 0.01 (L) 05/18/2023   TSH 0.01 (L) 05/04/2023   TSH 0.01 (L) 04/19/2023   TSH <0.01 Repeated and verified X2. (L) 04/12/2023   TSH 2.86 02/10/2021   TSH 2.71 09/10/2020   TSH 2.41 07/24/2017   TSH 1.32 05/11/2015   TSH 3.04 03/26/2014   TSH 2.05 11/24/2010   FREET4 1.4 05/18/2023   FREET4 2.0 (H) 05/04/2023   FREET4 2.7  (H) 04/19/2023   FREET4 1.1 02/10/2021   T3FREE 4.7 (H) 05/18/2023   T3FREE 6.4 (H) 05/04/2023   T3FREE 7.7 (H) 04/19/2023   T3FREE 2.8 02/10/2021   Antithyroid antibodies: Lab Results  Component Value Date   TSI 407 (H) 04/19/2023   She also has a history of a thyroid  nodule per review of available records:  Carotid ultrasound (05/23/2008):  A tiny incidental low density nodule is detected in the right lobe  of the thyroid  gland.  This is well under 1 cm in diameter and  likely benign.   Pt denies: - feeling nodules in neck - dysphagia - choking  She has: - hoarseness - voice quivering  Pt does have a FH of thyroid  ds. - daughter thyroid  nodule - had thyroid  sx. No FH of thyroid  cancer. No h/o radiation tx to head or neck. No seaweed or kelp, no recent contrast studies. No herbal supplements. No Biotin use except for multivitamin.  She has a history of shoulder pain and sees Gilberto Labella.  She has been getting steroid (Kenalog) injections.  Pt. has a history of  transaminitis - with normal LFTs 04/12/2023, but slightly higher levels last month: Component     Latest Ref Rng 04/12/2023 06/28/2023 07/11/2023  Total Bilirubin     0.2 - 1.2 mg/dL 0.5  0.7  0.6   Alkaline Phosphatase     39 -  117 U/L 59  61    AST     10 - 35 U/L 23  44 (H)  43 (H)   ALT     6 - 29 U/L 16  36 (H)  33 (H)   Total Protein     6.1 - 8.1 g/dL 6.4  6.5  6.6     She also has HL,  osteoporosis (on Prolia , first dose 11/17/2022), thrombocytopenia (with normal white blood cell count 04/12/2023).  She also has a history of back surgery and partial bilateral knee replacements.  ROS: + See HPI, + hypoacusis, + shortness of breath  Past Medical History:  Diagnosis Date   Anxiety    Arthritis    degenerative lumbar spine    Chronic neck pain    Closed fracture of base of fifth metatarsal bone 01/2018   Left   Complication of anesthesia    at one time my blood pressure dropped -lumbar - on bp med at  that time   Depression    Dysphagia    esophageal stretched 9-14    History of blood transfusion 2009   post - lumbar fusion    Hyperlipidemia    Hypertension    No longer takes med.   Osteoarthritis of left shoulder region    Mild, Dr. Agatha Horsfall   Osteoporosis    Past Surgical History:  Procedure Laterality Date   ABDOMINAL HYSTERECTOMY     ANTERIOR CERVICAL DECOMP/DISCECTOMY FUSION N/A 11/20/2014   Procedure: ANTERIOR CERVICAL DECOMPRESSION/DISCECTOMY FUSION 2 LEVELS;  Surgeon: Virl Grimes, MD;  Location: [facility] OR;  Service: Orthopedics;  Laterality: N/A;  Anterior cervical decompression fusion, cervical 3-4, cervical 4-5 with instrumentation and allograft   arm surgery Bilateral    transposed ulner nerve 10 (dr. Aloha Arnold)   BACK SURGERY  2009   Dr Karleen Overall SURGERY  01/2014   Dr Lamon Pillow   carpal tunnell  2006   bilateral   CHOLECYSTECTOMY     KNEE SURGERY  11/2009   right,  scope; left knee   NECK SURGERY  05/2008   Dr Lamon Pillow   OOPHORECTOMY Bilateral    PARTIAL KNEE ARTHROPLASTY  01/17/2012   LEFT KNEE   PARTIAL KNEE ARTHROPLASTY  01/17/2012   Procedure: UNICOMPARTMENTAL KNEE;  Surgeon: Neville Barbone, MD;  Location: [facility] OR;  Service: Orthopedics;  Laterality: Left;   PARTIAL KNEE ARTHROPLASTY Right 04/21/2020   Procedure: UNICOMPARTMENTAL KNEE;  Surgeon: Osa Blase, MD;  Location: [facility] ORS;  Service: Orthopedics;  Laterality: Right;   RHINOPLASTY  1980   SHOULDER SURGERY Bilateral    x 2/Dr. Littie Rife   thumb surgery Right 07/2022   Dr Aloha Arnold   TONSILLECTOMY     Social History   Socioeconomic History   Marital status: Widowed    Spouse name: Rebecca [name].   Number of children: 4   Years of education: Not on file   Highest education level: Not on file  Occupational History   Occupation: stay home   Tobacco Use   Smoking status: Never   Smokeless tobacco: Never  Vaping Use   Vaping status: Never Used  Substance and Sexual Activity   Alcohol use: Not Currently     Comment: rare   Drug use: No   Sexual activity: Not Currently  Other Topics Concern   Not on file  Social History Narrative   Lost husband 2021   Household: pt and son Josiah Nigh   Children: 3 daughters and 1 son  Social Drivers of Corporate investment banker Strain: Low Risk  (10/13/2022)   Overall Financial Resource Strain (CARDIA)    Difficulty of Paying Living Expenses: Not hard at all  Food Insecurity: No Food Insecurity (10/13/2022)   Hunger Vital Sign    Worried About Running Out of Food in the Last Year: Never true    Ran Out of Food in the Last Year: Never true  Transportation Needs: No Transportation Needs (10/13/2022)   PRAPARE - Administrator, Civil Service (Medical): No    Lack of Transportation (Non-Medical): No  Physical Activity: Inactive (10/13/2022)   Exercise Vital Sign    Days of Exercise per Week: 0 days    Minutes of Exercise per Session: 0 min  Stress: No Stress Concern Present (10/13/2022)   [employer] of Occupational Health - Occupational Stress Questionnaire    Feeling of Stress : Not at all  Social Connections: Socially Integrated (10/13/2022)   Social Connection and Isolation Panel [NHANES]    Frequency of Communication with Friends and Family: More than three times a week    Frequency of Social Gatherings with Friends and Family: More than three times a week    Attends Religious Services: More than 4 times per year    Active Member of [employer] or Organizations: Yes    Attends Engineer, structural: More than 4 times per year    Marital Status: Married  Catering manager Violence: Not At Risk (10/13/2022)   Humiliation, Afraid, Rape, and Kick questionnaire    Fear of Current or Ex-Partner: No    Emotionally Abused: No    Physically Abused: No    Sexually Abused: No   Current Outpatient Medications on File Prior to Visit  Medication Sig Dispense Refill   acetaminophen  (TYLENOL ) 500 MG tablet Take 1,000 mg by mouth in the morning and at  bedtime.     clonazePAM  (KLONOPIN ) 0.5 MG tablet Take 1 tablet (0.5 mg total) by mouth 3 (three) times daily as needed for anxiety. 270 tablet 0   denosumab  (PROLIA ) 60 MG/ML SOSY injection Inject 60 mg into the skin every 6 (six) months. Dx code: M81.0.  pt has appt on 05/17/23 1 mL 0   escitalopram  (LEXAPRO ) 10 MG tablet Take 1 tablet (10 mg total) by mouth daily. 90 tablet 1   loratadine (CLARITIN) 10 MG tablet Take 10 mg by mouth daily.     melatonin 5 MG TABS Take 5 mg by mouth at bedtime as needed (sleep).     methimazole  (TAPAZOLE ) 5 MG tablet Take 1 tablet (5 mg total) by mouth 2 (two) times daily. 60 tablet 5   Multiple Vitamin (MULTIVITAMIN) tablet Take 1 tablet by mouth daily.     Propylene Glycol (SYSTANE BALANCE) 0.6 % SOLN Place 1 drop into both eyes daily.     simvastatin  (ZOCOR ) 40 MG tablet Take 1 tablet (40 mg total) by mouth at bedtime. 90 tablet 1   vitamin B-12 (CYANOCOBALAMIN ) 100 MCG tablet Take 100 mcg by mouth daily.     vitamin C (ASCORBIC ACID) 500 MG tablet Take 500 mg by mouth daily.     Current Facility-Administered Medications on File Prior to Visit  Medication Dose Route Frequency Provider Last Rate Last Admin   [START ON 12/18/2023] denosumab  (PROLIA ) injection 60 mg  60 mg Subcutaneous Q6 months Paz, Jose E, MD       Allergies  Allergen Reactions   Tape Itching and Other (See Comments)  White adhesive tape -blisters   Alendronate Sodium Other (See Comments)    REACTION: jaw pain   Oxycodone -Acetaminophen  Hives   Cortisone     Pt received cortisone shot, and became very flushed, and hot, blisters, raw areas around lips & ears    Penicillins Swelling and Rash   Family History  Problem Relation Age of Onset   Prostate cancer Father    Diabetes Sister    Heart disease Sister        sister and brothers x 2   Kidney disease Sister    Kidney disease Brother    Heart disease Brother    Stroke Daughter    Stomach cancer Maternal Aunt    Colon cancer Neg  Hx    Breast cancer Neg Hx    Esophageal cancer Neg Hx    Pancreatic cancer Neg Hx    PE: BP 120/64   Pulse 71   Ht 5' (1.524 m)   Wt 135 lb 12.8 oz (61.6 kg)   SpO2 99%   BMI 26.52 kg/m  Wt Readings from Last 10 Encounters:  07/18/23 135 lb 12.8 oz (61.6 kg)  06/28/23 136 lb (61.7 kg)  04/19/23 141 lb (64 kg)  04/12/23 141 lb 6 oz (64.1 kg)  12/28/22 161 lb 4 oz (73.1 kg)  12/13/22 159 lb 9.6 oz (72.4 kg)  11/17/22 157 lb 1.9 oz (71.3 kg)  09/21/22 159 lb (72.1 kg)  05/19/22 153 lb (69.4 kg)  03/23/22 152 lb 2 oz (69 kg)   Constitutional: normal weight, in NAD Eyes:  EOMI, no exophthalmos, + mild B stare ENT: no neck masses, no cervical lymphadenopathy Cardiovascular: RRR, No MRG Respiratory: CTA B Musculoskeletal: no deformities Skin:no rashes Neurological: + tremor with outstretched hands  ASSESSMENT: 1. Thyrotoxicosis  2.  Thyroid  nodule  PLAN:  1. Patient  with history of low TSH with thyrotoxic symptoms including weight loss (18 pounds in 6 months), increased fatigue, weakness, hyperdefecation, tremors. - She was not on amiodarone, lithium, excessive doses of biotin, but had a steroid injection in shoulder 3 weeks prior to the abnormal TSH obtained in the month prior to our last visit.  We discussed that steroids could cause a low TSH, however, in her case, due to the above symptoms and the clear clinical picture, I did suspect thyrotoxicosis.  Reviewing her chart, her thyroid  dysfunction started sometimes after 01/2021 and all previous TSH levels going back to 2008 were normal. - At last visit we discussed about possible etiology for her thyrotoxicosis to include Graves ds  - her TSI's were elevated, confirming Graves' disease  Thyroiditis toxic multinodular goiter/ toxic adenoma -she had a documented thyroid  nodule, but there were no masses felt on palpation of her neck - As mentioned above, her thyroid  stimulating immunoglobulins returned elevated confirming  autoimmune thyrotoxicosis Murrell Arrant' disease).  We discussed about possible modalities for treatment for the above conditions to include thionamides, RAI treatment, or, last resort, surgery.  She agreed to start methimazole .  We discussed about possible side effects from methimazole  to include itching, elevated LFTs and decreased white blood cell count.  She did have normal LFTs and WBC before last visit.  Since we were pressed to get her TFTs under control so she could have shoulder surgery, we started methimazole . - The starting dose of methimazole  was 5 mg twice a day - Her TFTs improved after towards, with a TSH still suppressed at 0.01, stable, but gradually improving free T4 and free T3. -  Her LFTs increased slightly, but remained stable at last 2 checks - At today's visit, she feels better, without tremors, palpitations, and with resolved fatigue.  She did lose 6 pounds since last visit. - No active signs of Graves' ophthalmopathy: Double vision, blurry vision, eye pain, but she has xerophthalmia and mild stare - At today's visit we will recheck her TFTs.  If normal or at least improved she could go ahead with shoulder surgery. - I will see her back in 3 to 4 months.  2.  Thyroid  nodule - Subcentimeter thyroid , per review of the carotid ultrasound from 2010 - No neck compression symptoms or masses felt on palpation of her neck today - No further invasive lesion is needed for this  Orders Placed This Encounter  Procedures   TSH   T4, free   T3, free   Emilie Harden, MD PhD [facility] Endocrinology

## 2023-07-18 NOTE — Patient Instructions (Signed)
 Please continue Methimazole  5 mg 2x a day.  Please stop at the lab.  You should have an endocrinology follow-up appointment in 3-4 months

## 2023-07-19 LAB — TSH: TSH: 2.61 m[IU]/L (ref 0.40–4.50)

## 2023-07-19 LAB — T3, FREE: T3, Free: 2.8 pg/mL (ref 2.3–4.2)

## 2023-07-19 LAB — T4, FREE: Free T4: 0.8 ng/dL (ref 0.8–1.8)

## 2023-07-19 MED ORDER — METHIMAZOLE 5 MG PO TABS: ORAL_TABLET | ORAL | 5 refills | Status: DC

## 2023-07-19 NOTE — Addendum Note (Signed)
 Addended by: Emilie Harden on: 07/19/2023 11:21 AM   Modules accepted: Orders

## 2023-07-19 NOTE — Care Plan (Signed)
 Ortho Bundle Case Management Note  Patient Details  Name: Rebecca [name] MRN: [id number] Date of Birth: 19-Jan-1941     spoke with patient on phone. she will discharge to home with family to assist. no DME needed. OPPT set up with [facility]. discharge instructions discussed and mailed to patient. questions answered. Patient and MD in agreement. Choice offered.                 DME Arranged:    DME Agency:     HH Arranged:    HH Agency:     Additional Comments: Please contact me with any questions of if this plan should need to change.  Cornelia Dieter,  RN,BSN,MHA,CCM  [facility] Orthopaedic Specialist  [phone] 07/19/2023, 10:29 AM

## 2023-07-21 NOTE — Progress Notes (Signed)
 Surgery orders requested via Epic inbox.

## 2023-07-24 DIAGNOSIS — M19012 Primary osteoarthritis, left shoulder: Secondary | ICD-10-CM | POA: Diagnosis not present

## 2023-07-26 ENCOUNTER — Telehealth: Payer: Self-pay

## 2023-07-26 NOTE — Telephone Encounter (Signed)
Tried calling Pt- no answer, unable to leave message.  

## 2023-07-26 NOTE — Telephone Encounter (Signed)
 Spoke w/ Pt- she wanted to go ahead and schedule her Prolia  injection for September, informed we don't schedule that far ahead, she will be contacted around that time w/ cost OOP if any and to schedule.

## 2023-07-26 NOTE — Progress Notes (Addendum)
 COVID Vaccine received:  []  No [x]  Yes Date of any COVID positive Test in last 90 days:  PCP - Devonna Foley, MD  (LOV 06-28-23) cleared Cardiologist - Freddy Jain, MD, Friddie Jetty, NP cardiac clearance in 05-04-23 Epic note Oncology- Gray Layman, MD (LOV 11-17-2022) Endocrinology- Emilie Harden, MD  (LOV 07-18-2023)  Chest x-ray - 04-12-2023  Epic EKG - 09-21-22  Epic  Stress Test -  ECHO -  Cardiac Cath -   Pacemaker / ICD device [x]  No []  Yes   Spinal Cord Stimulator:[x]  No []  Yes       History of Sleep Apnea? [x]  No []  Yes   CPAP used?- [x]  No []  Yes    Does the patient monitor blood sugar?   [x]  N/A   []  No []  Yes  Patient has: [x]  NO Hx DM   []  Pre-DM   []  DM1  []   DM2 Last A1c was:        on       Blood Thinner / Instructions:none Aspirin  Instructions:   none  ERAS Protocol Ordered: []  No  [x]  Yes PRE-SURGERY [x]  ENSURE  []  G2    Patient is to be NPO after: 06:30  Dental hx: []  Dentures:  []  N/A      []  Bridge or Partial:                   []  Loose or Damaged teeth:   Comments: Patient was given the 5 CHG shower / bath instructions for Reverse Shoulder arthroplasty surgery along with 2 bottles of the CHG soap. Patient will start this on: Friday  08-04-23      The patient was given Benzoyl peroxide Gel as ordered. Instruction regarding application starting 2 days prior to surgery was given and patient voiced understanding.        Activity level: Patient is able / unable to climb a flight of stairs without difficulty; []  No CP  []  No SOB, but would have ___   Patient can / can not perform ADLs without assistance.   Anesthesia review: s/p ACDF C3-4, C4-5 (11-20-2014), HTN, Depression / anxiety, TMJ pain, thrombocytopenia, Leukopenia, CKD3, Grave's Disease.   Patient denies shortness of breath, fever, cough and chest pain at PAT appointment.  Patient verbalized understanding and agreement to the Pre-Surgical Instructions that were given to them at this PAT appointment.  Patient was also educated of the need to review these PAT instructions again prior to her surgery.I reviewed the appropriate phone numbers to call if they have any and questions or concerns.

## 2023-07-26 NOTE — Telephone Encounter (Signed)
 Copied from CRM [id number]. Topic: General - Other >> Jul 26, 2023  8:38 AM Annelle Kiel wrote: Reason for CRM: patient is requesting a call back from katelyn

## 2023-07-26 NOTE — Telephone Encounter (Signed)
 Copied from CRM [id number]. Topic: General - Other >> Jul 26, 2023  2:55 PM Shereese L wrote: Reason for CRM: patient called in to return a call from the doctors nurse. She stated she will be home all day and the nurse can call again

## 2023-07-26 NOTE — Patient Instructions (Addendum)
 SURGICAL WAITING ROOM VISITATION Patients having surgery or a procedure may have no more than 2 support people in the waiting area - these visitors may rotate in the visitor waiting room.   If the patient needs to stay at the hospital during part of their recovery, the visitor guidelines for inpatient rooms apply.  PRE-OP VISITATION  Pre-op nurse will coordinate an appropriate time for 1 support person to accompany the patient in pre-op.  This support person may not rotate.  This visitor will be contacted when the time is appropriate for the visitor to come back in the pre-op area.  Please refer to the [facility] website for the visitor guidelines for Inpatients (after your surgery is over and you are in a regular room).  You are not required to quarantine at this time prior to your surgery. However, you must do this: Hand Hygiene often Do NOT share personal items Notify your provider if you are in close contact with someone who has COVID or you develop fever 100.4 or greater, new onset of sneezing, cough, sore throat, shortness of breath or body aches.  If you test positive for Covid or have been in contact with anyone that has tested positive in the last 10 days please notify you surgeon.    Your procedure is scheduled on:  TUESDAY  Aug 08, 2023  Report to [facility] Main Entrance: [address] entrance where the [employer] is available.   Report to admitting at: 07:00    AM  Call this number if you have any questions or problems the morning of surgery [phone]  Do not eat food after Midnight the night prior to your surgery/procedure.  After Midnight you may have the following liquids until  06:30 AM DAY OF SURGERY  Clear Liquid Diet Water Black Coffee (sugar ok, NO MILK/CREAM OR CREAMERS)  Tea (sugar ok, NO MILK/CREAM OR CREAMERS) regular and decaf                             Plain Jell-O  with no fruit (NO RED)                                           Fruit ices  (not with fruit pulp, NO RED)                                     Popsicles (NO RED)                                                                  Juice: NO CITRUS JUICES: only apple, WHITE grape, WHITE cranberry Sports drinks like Gatorade or Powerade (NO RED)                   The day of surgery:  Drink ONE (1) Pre-Surgery Clear Ensure at   06:30     AM the morning of surgery. Drink in one sitting. Do not sip.  This drink was given to you during your hospital pre-op appointment visit. Nothing  else to drink after completing the Pre-Surgery Clear Ensure : No candy, chewing gum or throat lozenges.    FOLLOW ANY ADDITIONAL PRE OP INSTRUCTIONS YOU RECEIVED FROM YOUR SURGEON'S OFFICE!!!   Oral Hygiene is also important to reduce your risk of infection.        Remember - BRUSH YOUR TEETH THE MORNING OF SURGERY WITH YOUR REGULAR TOOTHPASTE  Do NOT smoke after Midnight the night before surgery.  STOP TAKING all Vitamins, Herbs and supplements 1 week before your surgery.   Take ONLY these medicines the morning of surgery with A SIP OF WATER: methimazole , Loratadine, escitalopram , and clonazepam  if needed for anxiety. You may take Tylenol  for pain and you may use your Eye Drops if needed.                     You may not have any metal on your body including hair pins, jewelry, and body piercing  Do not wear make-up, lotions, powders, perfumes  or deodorant  Do not wear nail polish including gel and S&S, artificial / acrylic nails, or any other type of covering on natural nails including finger and toenails. If you have artificial nails, gel coating, etc., that needs to be removed by a nail salon, Please have this removed prior to surgery. Not doing so may mean that your surgery could be cancelled or delayed if the Surgeon or anesthesia staff feels like they are unable to monitor you safely.   Do not shave 48 hours prior to surgery to avoid nicks in your skin which may contribute to  postoperative infections.   Contacts, Hearing Aids, dentures or bridgework may not be worn into surgery. DENTURES WILL BE REMOVED PRIOR TO SURGERY PLEASE DO NOT APPLY "Poly grip" OR ADHESIVES!!!  Patients discharged on the day of surgery will not be allowed to drive home.  Someone NEEDS to stay with you for the first 24 hours after anesthesia.  Do not bring your home medications to the hospital. The Pharmacy will dispense medications listed on your medication list to you during your admission in the Hospital.  Special Instructions: Bring a copy of your healthcare power of attorney and living will documents the day of surgery, if you wish to have them scanned into your [place] [facility] Records- EPIC  Please read over the following fact sheets you were given: IF YOU HAVE QUESTIONS ABOUT YOUR PRE-OP INSTRUCTIONS, PLEASE CALL [phone].     Pre-operative 5 CHG Bath Instructions   You can play a key role in reducing the risk of infection after surgery. Your skin needs to be as free of germs as possible. You can reduce the number of germs on your skin by washing with CHG (chlorhexidine  gluconate) soap before surgery. CHG is an antiseptic soap that kills germs and continues to kill germs even after washing.   DO NOT use if you have an allergy to chlorhexidine /CHG or antibacterial soaps. If your skin becomes reddened or irritated, stop using the CHG and notify one of our RNs at [phone]  Please shower with the CHG soap starting 4 days before surgery using the following schedule: START SHOWERS ON   FRIDAY  Aug 04, 2023  Please keep in mind the following:  DO NOT shave, including legs and underarms, starting the day of your first shower.   You may shave your face at any point before/day of surgery.   Place clean sheets  on your bed the day you start using CHG soap. Use a clean washcloth (not used since being washed) for each shower. DO NOT sleep with pets once you start using the CHG.   CHG Shower Instructions:  If you choose to wash your hair and private area, wash first with your normal shampoo/soap.  After you use shampoo/soap, rinse your hair and body thoroughly to remove shampoo/soap residue.  Turn the water OFF and apply about 3 tablespoons (45 ml) of CHG soap to a CLEAN washcloth.  Apply CHG soap ONLY FROM YOUR NECK DOWN TO YOUR TOES (washing for 3-5 minutes)  DO NOT use CHG soap on face, private areas, open wounds, or sores.  Pay special attention to the area where your surgery is being performed.  If you are having back surgery, having someone wash your back for you may be helpful.  Wait 2 minutes after CHG soap is applied, then you may rinse off the CHG soap.  Pat dry with a clean towel  Put on clean clothes/pajamas   If you choose to wear lotion, please use ONLY the CHG-compatible lotions on the back of this paper.     Additional instructions for the day of surgery: DO NOT APPLY any lotions, deodorants, cologne, or perfumes.   Put on clean/comfortable clothes.  Brush your teeth.  Ask your nurse before applying any prescription medications to the skin.      Preparing for Total Shoulder Arthroplasty ================================================================= Please follow these instructions carefully, in addition to any other special Bathing information that was explained to you at the Presurgical Appointment:  BENZOYL PEROXIDE 5% GEL: Used to kill bacteria on the skin which could cause an infection at the surgery site.   Please do not use if you have an allergy to benzoyl peroxide. If your skin becomes reddened/irritated stop using the benzoyl peroxide and inform your Doctor.   Starting two days before surgery, apply as follows:  1. Apply benzoyl peroxide gel in the morning and  at night. Apply after taking a shower. If you are not taking a shower, clean entire shoulder front, back, and side, along with the armpit with a clean wet washcloth.  2. Place a quarter-sized dollop of the gel on your SHOULDER and rub in thoroughly, making sure to cover the front, back, and side of your shoulder, along with the armpit.   2 Days prior to Surgery  [facility]  Aug 06, 2023 First Application _______ Morning Second Application _______ Night  Day Before Surgery     [facility]  Aug 07, 2023 First Application______ Morning  On the night before surgery, wash your entire body (except hair, face and private areas) with CHG Soap. THEN, rub in the LAST application of the Benzoyl Peroxide Gel on your shoulder.   3. On the Morning of Surgery wash your BODY AGAIN with CHG Soap (except hair, face and private areas)  4. DO NOT USE THE BENZOYL PEROXIDE GEL ON THE DAY OF YOUR SURGERY          CHG Compatible Lotions   Aveeno Moisturizing lotion  Cetaphil Moisturizing Cream  Cetaphil Moisturizing Lotion  Clairol Herbal Essence Moisturizing Lotion, Dry Skin  Clairol Herbal Essence Moisturizing Lotion, Extra Dry Skin  Clairol Herbal Essence Moisturizing Lotion, Normal  Skin  Curel Age Defying Therapeutic Moisturizing Lotion with Alpha Hydroxy  Curel Extreme Care Body Lotion  Curel Soothing Hands Moisturizing Hand Lotion  Curel Therapeutic Moisturizing Cream, Fragrance-Free  Curel Therapeutic Moisturizing Lotion, Fragrance-Free  Curel Therapeutic Moisturizing Lotion, Original Formula  Eucerin Daily Replenishing Lotion  Eucerin Dry Skin Therapy Plus Alpha Hydroxy Crme  Eucerin Dry Skin Therapy Plus Alpha Hydroxy Lotion  Eucerin Original Crme  Eucerin Original Lotion  Eucerin Plus Crme Eucerin Plus Lotion  Eucerin TriLipid Replenishing Lotion  Keri Anti-Bacterial Hand Lotion  Keri Deep Conditioning Original Lotion Dry Skin Formula Softly Scented  Keri Deep Conditioning Original Lotion,  Fragrance Free Sensitive Skin Formula  Keri Lotion Fast Absorbing Fragrance Free Sensitive Skin Formula  Keri Lotion Fast Absorbing Softly Scented Dry Skin Formula  Keri Original Lotion  Keri Skin Renewal Lotion Keri Silky Smooth Lotion  Keri Silky Smooth Sensitive Skin Lotion  Nivea Body Creamy Conditioning Oil  Nivea Body Extra Enriched Lotion  Nivea Body Original Lotion  Nivea Body Sheer Moisturizing Lotion Nivea Crme  Nivea Skin Firming Lotion  NutraDerm 30 Skin Lotion  NutraDerm Skin Lotion  NutraDerm Therapeutic Skin Cream  NutraDerm Therapeutic Skin Lotion  ProShield Protective Hand Cream  Provon moisturizing lotion   FAILURE TO FOLLOW THESE INSTRUCTIONS MAY RESULT IN THE CANCELLATION OF YOUR SURGERY  PATIENT SIGNATURE_________________________________  NURSE SIGNATURE__________________________________  ________________________________________________________________________         Rebecca [name]    An incentive spirometer is a tool that can help keep your lungs clear and active. This tool measures how well you are filling your lungs with each breath. Taking long deep breaths may help reverse or decrease the chance of developing breathing (pulmonary) problems (especially infection) following: A long period of time when you are unable to move or be active. BEFORE THE PROCEDURE  If the spirometer includes an indicator to show your best effort, your nurse or respiratory therapist will set it to a desired goal. If possible, sit up straight or lean slightly forward. Try not to slouch. Hold the incentive spirometer in an upright position. INSTRUCTIONS FOR USE  Sit on the edge of your bed if possible, or sit up as far as you can in bed or on a chair. Hold the incentive spirometer in an upright position. Breathe out normally. Place the mouthpiece in your mouth and seal your lips tightly around it. Breathe in slowly and as deeply as possible, raising the piston or  the ball toward the top of the column. Hold your breath for 3-5 seconds or for as long as possible. Allow the piston or ball to fall to the bottom of the column. Remove the mouthpiece from your mouth and breathe out normally. Rest for a few seconds and repeat Steps 1 through 7 at least 10 times every 1-2 hours when you are awake. Take your time and take a few normal breaths between deep breaths. The spirometer may include an indicator to show your best effort. Use the indicator as a goal to work toward during each repetition. After each set of 10 deep breaths, practice coughing to be sure your lungs are clear. If you have an incision (the cut made at the time of surgery), support your incision when coughing by placing a pillow or rolled up towels firmly against it. Once you are able to get out of bed, walk around indoors and cough well. You may stop using the incentive spirometer when instructed by your caregiver.  RISKS AND COMPLICATIONS Take your time  so you do not get dizzy or light-headed. If you are in pain, you may need to take or ask for pain medication before doing incentive spirometry. It is harder to take a deep breath if you are having pain. AFTER USE Rest and breathe slowly and easily. It can be helpful to keep track of a log of your progress. Your caregiver can provide you with a simple table to help with this. If you are using the spirometer at home, follow these instructions: SEEK MEDICAL CARE IF:  You are having difficultly using the spirometer. You have trouble using the spirometer as often as instructed. Your pain medication is not giving enough relief while using the spirometer. You develop fever of 100.5 F (38.1 C) or higher.                                                                                                    SEEK IMMEDIATE MEDICAL CARE IF:  You cough up bloody sputum that had not been present before. You develop fever of 102 F (38.9 C) or greater. You  develop worsening pain at or near the incision site. MAKE SURE YOU:  Understand these instructions. Will watch your condition. Will get help right away if you are not doing well or get worse. Document Released: 07/11/2006 Document Revised: 05/23/2011 Document Reviewed: 09/11/2006 [facility] Patient Information 2014 [place], [place].      If you would like to see a video about joint replacement:   [email or web address]

## 2023-07-27 ENCOUNTER — Encounter (HOSPITAL_COMMUNITY): Payer: Self-pay

## 2023-07-27 ENCOUNTER — Other Ambulatory Visit: Payer: Self-pay

## 2023-07-27 ENCOUNTER — Encounter (HOSPITAL_COMMUNITY)
Admission: RE | Admit: 2023-07-27 | Discharge: 2023-07-27 | Disposition: A | Discharge status: Home / Self Care | Source: Ambulatory Visit | Attending: Orthopedic Surgery | Admitting: Orthopedic Surgery

## 2023-07-27 VITALS — BP 165/79 | HR 58 | Temp 97.9°F | Resp 14 | Ht 60.0 in | Wt 136.0 lb

## 2023-07-27 DIAGNOSIS — Z01818 Encounter for other preprocedural examination: Secondary | ICD-10-CM | POA: Diagnosis not present

## 2023-07-27 DIAGNOSIS — Z79899 Other long term (current) drug therapy: Secondary | ICD-10-CM | POA: Diagnosis not present

## 2023-07-27 DIAGNOSIS — R7989 Other specified abnormal findings of blood chemistry: Secondary | ICD-10-CM | POA: Diagnosis not present

## 2023-07-27 HISTORY — DX: Thyrotoxicosis with diffuse goiter without thyrotoxic crisis or storm: E05.00

## 2023-07-27 HISTORY — DX: Fibromyalgia: M79.7

## 2023-07-27 LAB — COMPREHENSIVE METABOLIC PANEL WITH GFR
ALT: 24 U/L (ref 0–44)
AST: 34 U/L (ref 15–41)
Albumin: 4.1 g/dL (ref 3.5–5.0)
Alkaline Phosphatase: 55 U/L (ref 38–126)
Anion gap: 8 (ref 5–15)
BUN: 18 mg/dL (ref 8–23)
CO2: 24 mmol/L (ref 22–32)
Calcium: 9.6 mg/dL (ref 8.9–10.3)
Chloride: 106 mmol/L (ref 98–111)
Creatinine, Ser: 1.07 mg/dL — ABNORMAL HIGH (ref 0.44–1.00)
GFR, Estimated: 52 mL/min — ABNORMAL LOW (ref >60)
Glucose, Bld: 95 mg/dL (ref 70–99)
Potassium: 4.7 mmol/L (ref 3.5–5.1)
Sodium: 138 mmol/L (ref 135–145)
Total Bilirubin: 0.8 mg/dL (ref 0.0–1.2)
Total Protein: 7.3 g/dL (ref 6.5–8.1)

## 2023-07-27 LAB — CBC
HCT: 38.3 % (ref 36.0–46.0)
Hemoglobin: 12.2 g/dL (ref 12.0–15.0)
MCH: 30.5 pg (ref 26.0–34.0)
MCHC: 31.9 g/dL (ref 30.0–36.0)
MCV: 95.8 fL (ref 80.0–100.0)
Platelets: 159 10*3/uL (ref 150–400)
RBC: 4 MIL/uL (ref 3.87–5.11)
RDW: 14.5 % (ref 11.5–15.5)
WBC: 6.2 10*3/uL (ref 4.0–10.5)
nRBC: 0 % (ref 0.0–0.2)

## 2023-07-27 LAB — SURGICAL PCR SCREEN
MRSA, PCR: NEGATIVE
Staphylococcus aureus: POSITIVE — AB

## 2023-07-28 NOTE — Progress Notes (Signed)
 Patient's PCR screen is positive for STAPH. Appropriate notes have been placed on the patient's chart. This note has been routed to Dr.Landau and Hurshel Maidens, [place] for review. The Patient's surgery is currently scheduled for: 08-08-23  at [facility].  Morrie Arbour, BSN, CVRN-BC   Pre-Surgical Testing Nurse [facility]  [phone]

## 2023-08-02 NOTE — H&P (Signed)
 SHOULDER ARTHROPLASTY ADMISSION H&P  Patient ID: Rebecca [name] MRN: [id number] DOB/AGE: [birth year] 83 y.o.  Chief Complaint: left shoulder pain.  Planned Procedure Date: 08/08/23 Medical Clearance by Dr. Neomi [name]   Additional clearance by Dr. Ned [name]  HPI: Rebecca [name] is a 83 y.o. female who presents for evaluation of Left shoulder pain. The patient has a history of pain and functional disability in the left shoulder due to trauma and arthritis and has failed non-surgical conservative treatments for greater than 12 weeks to include NSAID's and/or analgesics, corticosteriod injections, and activity modification.  Onset of symptoms was gradual, starting 2 years ago with rapidlly worsening course since that time. The patient noted prior rotator cuff repair on the left shoulder.  Patient currently rates pain at moderate with activity. Patient has worsening of pain with activity and weight bearing, pain that interferes with activities of daily living, and crepitus.  Patient has evidence of rotator cuff arthropathy by imaging studies.  There is no active infection.  Past Medical History:  Diagnosis Date   Anxiety    Arthritis    degenerative lumbar spine    Chronic neck pain    Closed fracture of base of fifth metatarsal bone 01/2018   Left   Complication of anesthesia    at one time my blood pressure dropped -lumbar - on bp med at that time   Depression    Dysphagia    esophageal stretched 9-14    Fibromyalgia    Graves disease    History of blood transfusion 2009   post - lumbar fusion    Hyperlipidemia    Hypertension    No longer takes med.   Osteoarthritis of left shoulder region    Mild, Dr. Agatha [name]   Osteoporosis    Past Surgical History:  Procedure Laterality Date   ABDOMINAL HYSTERECTOMY     ANTERIOR CERVICAL DECOMP/DISCECTOMY FUSION N/A 11/20/2014   Procedure: ANTERIOR CERVICAL DECOMPRESSION/DISCECTOMY FUSION 2 LEVELS;  Surgeon: Rebecca Grimes, MD;  Location: [facility] OR;   Service: Orthopedics;  Laterality: N/A;  Anterior cervical decompression fusion, cervical 3-4, cervical 4-5 with instrumentation and allograft   arm surgery Bilateral    transposed ulner nerve 10 (dr. Aloha [name])   BACK SURGERY  2009   Dr Rebecca [name] SURGERY  01/2014   Dr Rebecca [name]   carpal tunnell  2006   bilateral   CHOLECYSTECTOMY     KNEE SURGERY  11/2009   right,  scope; left knee   NECK SURGERY  05/2008   Dr Rebecca [name]   OOPHORECTOMY Bilateral    PARTIAL KNEE ARTHROPLASTY  01/17/2012   LEFT KNEE   PARTIAL KNEE ARTHROPLASTY  01/17/2012   Procedure: UNICOMPARTMENTAL KNEE;  Surgeon: Rebecca Barbone, MD;  Location: [facility] OR;  Service: Orthopedics;  Laterality: Left;   PARTIAL KNEE ARTHROPLASTY Right 04/21/2020   Procedure: UNICOMPARTMENTAL KNEE;  Surgeon: Rebecca Blase, MD;  Location: [facility] ORS;  Service: Orthopedics;  Laterality: Right;   RHINOPLASTY  1980   SHOULDER SURGERY Bilateral    x 2/Dr. Littie [name]   thumb surgery Right 07/2022   Dr Rebecca [name]   TONSILLECTOMY     Allergies  Allergen Reactions   Tape Itching and Other (See Comments)    White adhesive tape -blisters   Alendronate Sodium Other (See Comments)    jaw pain   Oxycodone -Acetaminophen  Hives   Cortisone     Pt received cortisone shot, and became very flushed, and hot, blisters, raw areas around lips & ears  Penicillins Swelling and Rash   Prior to Admission medications   Medication Sig Start Date End Date Taking? Authorizing Provider  acetaminophen  (TYLENOL ) 500 MG tablet Take 1,000 mg by mouth in the morning and at bedtime.   Yes [provider]  Rebecca [name]  (KLONOPIN ) 0.5 MG tablet Take 1 tablet (0.5 mg total) by mouth 3 (three) times daily as needed for anxiety. 05/09/23  Yes Paz, Rebecca Ket, MD  cyanocobalamin  (VITAMIN B12) 1000 MCG tablet Take 1,000 mcg by mouth daily.   Yes [provider]  denosumab  (PROLIA ) 60 MG/ML SOSY injection Inject 60 mg into the skin every 6 (six) months. Dx code: M81.0.  pt has appt  on 05/17/23 05/03/23  Yes Paz, Rebecca Ket, MD  escitalopram  (LEXAPRO ) 10 MG tablet Take 1 tablet (10 mg total) by mouth daily. 05/24/23  Yes Paz, Rebecca E, MD  loratadine (CLARITIN) 10 MG tablet Take 10 mg by mouth daily.   Yes [provider]  melatonin 5 MG TABS Take 5 mg by mouth at bedtime as needed (sleep).   Yes [provider]  methimazole  (TAPAZOLE ) 5 MG tablet Take 1 tablet by mouth in the morning and half a tablet in the afternoon. 07/19/23  Yes Rebecca Harden, MD  Multiple Vitamin (MULTIVITAMIN) tablet Take 1 tablet by mouth daily.   Yes [provider]  Propylene Glycol (SYSTANE BALANCE) 0.6 % SOLN Place 1 drop into both eyes daily.   Yes [provider]  simvastatin  (ZOCOR ) 40 MG tablet Take 1 tablet (40 mg total) by mouth at bedtime. 05/24/23  Yes Paz, Rebecca Ket, MD  vitamin C (ASCORBIC ACID) 500 MG tablet Take 500 mg by mouth daily.   Yes [provider]   Social History   Socioeconomic History   Marital status: Widowed    Spouse name: Rebecca [name].   Number of children: 4   Years of education: Not on file   Highest education level: Not on file  Occupational History   Occupation: stay home   Tobacco Use   Smoking status: Never   Smokeless tobacco: Never  Vaping Use   Vaping status: Never Used  Substance and Sexual Activity   Alcohol use: Not Currently    Comment: rare   Drug use: No   Sexual activity: Not Currently  Other Topics Concern   Not on file  Social History Narrative   Lost husband 2021   Household: pt and son Rebecca [name]   Children: 3 daughters and 1 son   Social Drivers of Corporate investment banker Strain: Low Risk  (10/13/2022)   [name] Resource Strain (CARDIA)    Difficulty of Paying Living Expenses: Not hard at all  Food Insecurity: No Food Insecurity (10/13/2022)   Hunger Vital Sign    Worried About Running Out of Food in the Last Year: Never true    Ran Out of Food in the Last Year: Never true  Transportation  Needs: No Transportation Needs (10/13/2022)   PRAPARE - Administrator, Civil Service (Medical): No    Lack of Transportation (Non-Medical): No  Physical Activity: Inactive (10/13/2022)   Exercise Vital Sign    Days of Exercise per Week: 0 days    Minutes of Exercise per Session: 0 min  Stress: No Stress Concern Present (10/13/2022)   [employer] of Occupational Health - Occupational Stress Questionnaire    Feeling of Stress : Not at all  Social Connections: Socially Integrated (10/13/2022)   Social Connection and  Isolation Panel [NHANES]    Frequency of Communication with Friends and Family: More than three times a week    Frequency of Social Gatherings with Friends and Family: More than three times a week    Attends Religious Services: More than 4 times per year    Active Member of [employer] or Organizations: Yes    Attends Engineer, structural: More than 4 times per year    Marital Status: Married   Family History  Problem Relation Age of Onset   Prostate cancer Father    Diabetes Sister    Heart disease Sister        sister and brothers x 2   Kidney disease Sister    Kidney disease Brother    Heart disease Brother    Stroke Daughter    Stomach cancer Maternal Aunt    Colon cancer Neg Hx    Breast cancer Neg Hx    Esophageal cancer Neg Hx    Pancreatic cancer Neg Hx     ROS: Currently denies lightheadedness, dizziness, Fever, chills, CP, SOB.   No personal history of DVT, PE, MI, or CVA. No loose teeth. + dentures All other systems have been reviewed and were otherwise currently negative with the exception of those mentioned in the HPI and as above.  BMI: Estimated body mass index is 26.56 kg/m as calculated from the following:   Height as of 07/27/23: 5' (1.524 m).   Weight as of 07/27/23: 61.7 kg.  Lab Results  Component Value Date   ALBUMIN  4.1 07/27/2023   Diabetes: Patient does not have a diagnosis of diabetes.     Smoking Status:   reports  that she has never smoked. She has never used smokeless tobacco.     Objective: Vitals: Ht: 5'  Wt: 135.6 lbs Temp: 97.8 BP: 179/82 Pulse: 64 O2 98% on room air.   Physical Exam: General: Alert, NAD.   HEENT: EOMI, Good Neck Extension  Pulm: No increased work of breathing.  Clear B/L A/P w/o crackle or wheeze.  CV: RRR, No m/g/r appreciated  GI: soft, NT, ND Neuro: Neuro without gross focal deficit.  Sensation intact distally Skin: No lesions in the area of chief complaint MSK/Surgical Site: left shoulder pain with range of motion.  Forward flexion/abduction approximately 0-80.  Internal rotation to lateral left thigh.  External rotation to 20.  Decreased Rotator cuff strength.  NVI distally.  Imaging Review CT/x-ray demonstrate severe degenerative joint disease of the left shoulder.    Assessment: Left shoulder rotator cuff arthropathy   Plan: Plan for Procedure(s): ARTHROPLASTY, SHOULDER, TOTAL, REVERSE  The patient history, physical exam, clinical judgement of the provider and imaging are consistent with end stage degenerative joint disease and reverse total joint arthroplasty is deemed medically necessary. The treatment options including medical management, injection therapy, and arthroplasty were discussed at length. The risks and benefits of Procedure(s): ARTHROPLASTY, SHOULDER, TOTAL, REVERSE were presented and reviewed.  The risks of nonoperative treatment, versus surgical intervention including but not limited to continued pain, aseptic loosening, stiffness, dislocation/subluxation, infection, bleeding, nerve injury, blood clots, cardiopulmonary complications, morbidity, mortality, among others were discussed. The patient verbalizes understanding and wishes to proceed with the plan.  Patient is being admitted for surgery, OT, pain control, prophylactic antibiotics, VTE prophylaxis, progressive ambulation, ADL's and discharge planning.   The patient does meet the criteria for  TXA which will be used perioperatively.   The patient is planning to be discharged home care of son.  Abraham Hoffmann, PA-C 08/02/2023 2:33 PM

## 2023-08-08 ENCOUNTER — Ambulatory Visit (HOSPITAL_COMMUNITY): Payer: Self-pay | Admitting: Physician Assistant

## 2023-08-08 ENCOUNTER — Ambulatory Visit (HOSPITAL_COMMUNITY)

## 2023-08-08 ENCOUNTER — Ambulatory Visit (HOSPITAL_COMMUNITY): Payer: Self-pay | Admitting: Anesthesiology

## 2023-08-08 ENCOUNTER — Ambulatory Visit (HOSPITAL_COMMUNITY)
Admission: RE | Admit: 2023-08-08 | Discharge: 2023-08-08 | Disposition: A | Discharge status: Home / Self Care | Source: Ambulatory Visit | Attending: Orthopedic Surgery | Admitting: Orthopedic Surgery

## 2023-08-08 ENCOUNTER — Other Ambulatory Visit: Payer: Self-pay

## 2023-08-08 ENCOUNTER — Encounter (HOSPITAL_COMMUNITY)
Admission: RE | Disposition: A | Discharge status: Home / Self Care | Payer: Self-pay | Source: Ambulatory Visit | Attending: Orthopedic Surgery

## 2023-08-08 ENCOUNTER — Encounter (HOSPITAL_COMMUNITY): Payer: Self-pay | Admitting: Orthopedic Surgery

## 2023-08-08 DIAGNOSIS — M19012 Primary osteoarthritis, left shoulder: Secondary | ICD-10-CM

## 2023-08-08 DIAGNOSIS — Z96612 Presence of left artificial shoulder joint: Secondary | ICD-10-CM | POA: Diagnosis not present

## 2023-08-08 DIAGNOSIS — I10 Essential (primary) hypertension: Secondary | ICD-10-CM | POA: Diagnosis not present

## 2023-08-08 DIAGNOSIS — M797 Fibromyalgia: Secondary | ICD-10-CM | POA: Diagnosis not present

## 2023-08-08 DIAGNOSIS — F32A Depression, unspecified: Secondary | ICD-10-CM | POA: Insufficient documentation

## 2023-08-08 DIAGNOSIS — F419 Anxiety disorder, unspecified: Secondary | ICD-10-CM | POA: Diagnosis not present

## 2023-08-08 DIAGNOSIS — G8918 Other acute postprocedural pain: Secondary | ICD-10-CM | POA: Diagnosis not present

## 2023-08-08 DIAGNOSIS — Z471 Aftercare following joint replacement surgery: Secondary | ICD-10-CM | POA: Diagnosis not present

## 2023-08-08 HISTORY — PX: REVERSE SHOULDER ARTHROPLASTY: SHX5054

## 2023-08-08 SURGERY — ARTHROPLASTY, SHOULDER, TOTAL, REVERSE
Anesthesia: Regional | Site: Shoulder | Laterality: Left

## 2023-08-08 MED ORDER — PROPOFOL 10 MG/ML IV BOLUS
INTRAVENOUS | Status: DC | PRN
  Administered 2023-08-08: 110 mg via INTRAVENOUS

## 2023-08-08 MED ORDER — LACTATED RINGERS IV SOLN: INTRAVENOUS | Status: DC

## 2023-08-08 MED ORDER — ROCURONIUM BROMIDE 10 MG/ML (PF) SYRINGE
PREFILLED_SYRINGE | INTRAVENOUS | Status: DC | PRN
  Administered 2023-08-08: 50 mg via INTRAVENOUS

## 2023-08-08 MED ORDER — SUGAMMADEX SODIUM 200 MG/2ML IV SOLN
INTRAVENOUS | Status: DC | PRN
  Administered 2023-08-08: 200 mg via INTRAVENOUS

## 2023-08-08 MED ORDER — STERILE WATER FOR IRRIGATION IR SOLN
Status: DC | PRN
  Administered 2023-08-08: 1000 mL

## 2023-08-08 MED ORDER — CHLORHEXIDINE GLUCONATE 0.12 % MT SOLN
15.0000 mL | Freq: Once | OROMUCOSAL | Status: AC
  Administered 2023-08-08: 15 mL via OROMUCOSAL

## 2023-08-08 MED ORDER — TRAMADOL HCL 50 MG PO TABS: 50.0000 mg | ORAL_TABLET | Freq: Four times a day (QID) | ORAL | 0 refills | Status: AC | PRN

## 2023-08-08 MED ORDER — DEXAMETHASONE SODIUM PHOSPHATE 10 MG/ML IJ SOLN
INTRAMUSCULAR | Status: AC
  Filled 2023-08-08: qty 1

## 2023-08-08 MED ORDER — PHENYLEPHRINE HCL-NACL 20-0.9 MG/250ML-% IV SOLN
INTRAVENOUS | Status: DC | PRN
  Administered 2023-08-08: 50 ug/min via INTRAVENOUS

## 2023-08-08 MED ORDER — LIDOCAINE HCL (PF) 2 % IJ SOLN
INTRAMUSCULAR | Status: AC
  Filled 2023-08-08: qty 5

## 2023-08-08 MED ORDER — ACETAMINOPHEN 500 MG PO TABS
1000.0000 mg | ORAL_TABLET | Freq: Once | ORAL | Status: DC
  Filled 2023-08-08: qty 2

## 2023-08-08 MED ORDER — LIDOCAINE 2% (20 MG/ML) 5 ML SYRINGE
INTRAMUSCULAR | Status: DC | PRN
  Administered 2023-08-08: 40 mg via INTRAVENOUS

## 2023-08-08 MED ORDER — ONDANSETRON HCL 4 MG PO TABS: 4.0000 mg | ORAL_TABLET | Freq: Three times a day (TID) | ORAL | 0 refills | Status: DC | PRN

## 2023-08-08 MED ORDER — ROCURONIUM BROMIDE 10 MG/ML (PF) SYRINGE
PREFILLED_SYRINGE | INTRAVENOUS | Status: AC
  Filled 2023-08-08: qty 10

## 2023-08-08 MED ORDER — FENTANYL CITRATE PF 50 MCG/ML IJ SOSY
50.0000 ug | PREFILLED_SYRINGE | INTRAMUSCULAR | Status: AC
  Administered 2023-08-08: 50 ug via INTRAVENOUS
  Filled 2023-08-08: qty 1

## 2023-08-08 MED ORDER — BUPIVACAINE LIPOSOME 1.3 % IJ SUSP
INTRAMUSCULAR | Status: DC | PRN
  Administered 2023-08-08: 10 mL via PERINEURAL

## 2023-08-08 MED ORDER — SENNA-DOCUSATE SODIUM 8.6-50 MG PO TABS: 2.0000 | ORAL_TABLET | Freq: Every day | ORAL | 1 refills | Status: DC

## 2023-08-08 MED ORDER — TRANEXAMIC ACID-NACL 1000-0.7 MG/100ML-% IV SOLN
1000.0000 mg | INTRAVENOUS | Status: AC
  Administered 2023-08-08: 1000 mg via INTRAVENOUS
  Filled 2023-08-08: qty 100

## 2023-08-08 MED ORDER — 0.9 % SODIUM CHLORIDE (POUR BTL) OPTIME
TOPICAL | Status: DC | PRN
  Administered 2023-08-08: 1000 mL

## 2023-08-08 MED ORDER — CELECOXIB 200 MG PO CAPS
200.0000 mg | ORAL_CAPSULE | Freq: Once | ORAL | Status: AC
  Administered 2023-08-08: 200 mg via ORAL
  Filled 2023-08-08: qty 1

## 2023-08-08 MED ORDER — ORAL CARE MOUTH RINSE: 15.0000 mL | Freq: Once | OROMUCOSAL | Status: AC

## 2023-08-08 MED ORDER — EPHEDRINE SULFATE (PRESSORS) 50 MG/ML IJ SOLN
INTRAMUSCULAR | Status: DC | PRN
  Administered 2023-08-08 (×2): 10 mg via INTRAVENOUS

## 2023-08-08 MED ORDER — BUPIVACAINE HCL (PF) 0.5 % IJ SOLN
INTRAMUSCULAR | Status: DC | PRN
  Administered 2023-08-08: 10 mL via PERINEURAL

## 2023-08-08 MED ORDER — MUPIROCIN 2 % EX OINT: 1.0000 | TOPICAL_OINTMENT | Freq: Two times a day (BID) | CUTANEOUS | 0 refills | Status: AC

## 2023-08-08 MED ORDER — ONDANSETRON HCL 4 MG/2ML IJ SOLN
INTRAMUSCULAR | Status: DC | PRN
Start: 2023-08-08 — End: 2023-08-08
  Administered 2023-08-08: 4 mg via INTRAVENOUS

## 2023-08-08 MED ORDER — POVIDONE-IODINE 10 % EX SWAB: 2.0000 | Freq: Once | CUTANEOUS | Status: DC

## 2023-08-08 MED ORDER — ACETAMINOPHEN 500 MG PO TABS: 1000.0000 mg | ORAL_TABLET | Freq: Once | ORAL | Status: DC

## 2023-08-08 MED ORDER — CEFAZOLIN SODIUM-DEXTROSE 2-4 GM/100ML-% IV SOLN
2.0000 g | INTRAVENOUS | Status: AC
  Administered 2023-08-08: 2 g via INTRAVENOUS
  Filled 2023-08-08: qty 100

## 2023-08-08 MED ORDER — ONDANSETRON HCL 4 MG/2ML IJ SOLN
INTRAMUSCULAR | Status: AC
  Filled 2023-08-08: qty 2

## 2023-08-08 MED ORDER — CHLORHEXIDINE GLUCONATE 4 % EX SOLN: 1.0000 | CUTANEOUS | 1 refills | Status: DC

## 2023-08-08 MED ORDER — DEXAMETHASONE SODIUM PHOSPHATE 10 MG/ML IJ SOLN
INTRAMUSCULAR | Status: DC | PRN
  Administered 2023-08-08: 10 mg via INTRAVENOUS

## 2023-08-08 MED ORDER — HYDROMORPHONE HCL 1 MG/ML IJ SOLN: 0.2500 mg | INTRAMUSCULAR | Status: DC | PRN

## 2023-08-08 MED ORDER — DROPERIDOL 2.5 MG/ML IJ SOLN: 0.6250 mg | Freq: Once | INTRAMUSCULAR | Status: DC | PRN

## 2023-08-08 SURGICAL SUPPLY — 55 items
BAG COUNTER SPONGE SURGICOUNT (BAG) IMPLANT
BAG ZIPLOCK 12X15 (MISCELLANEOUS) ×2 IMPLANT
BASEPLATE GLENOSPHERE 25 (Plate) IMPLANT
BEARING HUMERAL SHLDER 36M STD (Shoulder) IMPLANT
BIT DRILL TWIST 2.7 (BIT) IMPLANT
BLADE SAW SGTL 73X25 THK (BLADE) ×2 IMPLANT
CEMENT BONE DEPUY (Cement) IMPLANT
CLSR STERI-STRIP ANTIMIC 1/2X4 (GAUZE/BANDAGES/DRESSINGS) ×2 IMPLANT
COOLER ICEMAN CLASSIC (MISCELLANEOUS) ×2 IMPLANT
COVER BACK TABLE 60X90IN (DRAPES) ×2 IMPLANT
COVER SURGICAL LIGHT HANDLE (MISCELLANEOUS) ×2 IMPLANT
DRAPE POUCH INSTRU U-SHP 10X18 (DRAPES) ×2 IMPLANT
DRAPE SHEET LG 3/4 BI-LAMINATE (DRAPES) ×4 IMPLANT
DRAPE SURG 17X11 SM STRL (DRAPES) ×2 IMPLANT
DRAPE SURG 17X23 STRL (DRAPES) ×2 IMPLANT
DRAPE SURG ORHT 6 SPLT 77X108 (DRAPES) ×4 IMPLANT
DRAPE TOP 10253 STERILE (DRAPES) ×2 IMPLANT
DRAPE U-SHAPE 47X51 STRL (DRAPES) ×2 IMPLANT
DRSG MEPILEX POST OP 4X8 (GAUZE/BANDAGES/DRESSINGS) ×2 IMPLANT
DURAPREP 26ML APPLICATOR (WOUND CARE) ×4 IMPLANT
ELECT BLADE TIP CTD 4 INCH (ELECTRODE) ×2 IMPLANT
ELECT REM PT RETURN 15FT ADLT (MISCELLANEOUS) ×2 IMPLANT
FACESHIELD WRAPAROUND (MASK) ×1 IMPLANT
FACESHIELD WRAPAROUND OR TEAM (MASK) ×2 IMPLANT
GLENOID SPHERE STD STRL 36MM (Orthopedic Implant) IMPLANT
GLOVE BIO SURGEON STRL SZ 6.5 (GLOVE) ×2 IMPLANT
GLOVE BIOGEL PI IND STRL 7.0 (GLOVE) ×2 IMPLANT
GLOVE BIOGEL PI IND STRL 8 (GLOVE) ×2 IMPLANT
GLOVE ORTHO TXT STRL SZ7.5 (GLOVE) ×2 IMPLANT
GOWN STRL SURGICAL XL XLNG (GOWN DISPOSABLE) ×4 IMPLANT
HOOD PEEL AWAY T7 (MISCELLANEOUS) ×4 IMPLANT
KIT BASIN OR (CUSTOM PROCEDURE TRAY) ×2 IMPLANT
KIT TURNOVER KIT A (KITS) ×2 IMPLANT
PACK SHOULDER (CUSTOM PROCEDURE TRAY) ×2 IMPLANT
PAD COLD SHLDR WRAP-ON (PAD) ×2 IMPLANT
PIN STEINMANN THREADED TIP (PIN) IMPLANT
PIN THREADED REVERSE (PIN) IMPLANT
RESTRAINT HEAD UNIVERSAL NS (MISCELLANEOUS) ×2 IMPLANT
SCREW BONE LOCKING 4.75X30X3.5 (Screw) IMPLANT
SCREW BONE STRL 6.5MMX25MM (Screw) IMPLANT
SCREW LOCKING NS 4.75MMX20MM (Screw) IMPLANT
SLING ARM FOAM STRAP LRG (SOFTGOODS) IMPLANT
SLING ARM IMMOBILIZER LRG (SOFTGOODS) ×2 IMPLANT
SPONGE T-LAP 4X18 ~~LOC~~+RFID (SPONGE) IMPLANT
STEM HUMERAL STRL 12MMX14MM (Stem) IMPLANT
SUCTION TUBE FRAZIER 12FR DISP (SUCTIONS) ×2 IMPLANT
SUPPORT WRAP ARM LG (MISCELLANEOUS) ×2 IMPLANT
SUT MAXBRAID #5 CCS-NDL 2PK (SUTURE) IMPLANT
SUT MNCRL AB 3-0 PS2 18 (SUTURE) ×2 IMPLANT
SUT VIC AB 1 CT1 36 (SUTURE) ×2 IMPLANT
SUT VIC AB 2-0 CT1 TAPERPNT 27 (SUTURE) ×2 IMPLANT
TOWEL OR 17X26 10 PK STRL BLUE (TOWEL DISPOSABLE) ×2 IMPLANT
TOWER SMARTMIX MINI (MISCELLANEOUS) IMPLANT
TRAY HUM MINI SHOULDER +3 40 (Joint) IMPLANT
TUBE SUCTION HIGH CAP CLEAR NV (SUCTIONS) ×2 IMPLANT

## 2023-08-08 NOTE — Anesthesia Procedure Notes (Addendum)
 Procedure Name: Intubation Date/Time: 08/08/2023 11:46 AM  Performed by: Delona Ferron, CRNAPre-anesthesia Checklist: Patient identified, Emergency Drugs available, Suction available and Patient being monitored Patient Re-evaluated:Patient Re-evaluated prior to induction Oxygen Delivery Method: Circle System Utilized Preoxygenation: Pre-oxygenation with 100% oxygen Induction Type: IV induction Ventilation: Mask ventilation without difficulty Laryngoscope Size: Miller and 3 Grade View: Grade I Tube type: Oral Number of attempts: 1 Airway Equipment and Method: Stylet and Oral airway Placement Confirmation: ETT inserted through vocal cords under direct vision, positive ETCO2 and breath sounds checked- equal and bilateral Secured at: 20 cm Tube secured with: Tape Dental Injury: Teeth and Oropharynx as per pre-operative assessment

## 2023-08-08 NOTE — Interval H&P Note (Signed)
 History and Physical Interval Note:  08/08/2023 9:17 AM  Rebecca [name]  has presented today for surgery, with the diagnosis of LEFT SHOULDER OA.  The various methods of treatment have been discussed with the patient and family. After consideration of risks, benefits and other options for treatment, the patient has consented to  Procedure(s): ARTHROPLASTY, SHOULDER, TOTAL, REVERSE (Left) as a surgical intervention.  The patient's history has been reviewed, patient examined, no change in status, stable for surgery.  I have reviewed the patient's chart and labs.  Questions were answered to the patient's satisfaction.     Neville Barbone

## 2023-08-08 NOTE — Op Note (Signed)
 08/08/2023  1:07 PM  PATIENT:  Rebecca [name]    PRE-OPERATIVE DIAGNOSIS: Left shoulder osteoarthritis with history of previous rotator cuff repair  POST-OPERATIVE DIAGNOSIS:  Same  PROCEDURE: LEFT reverse Total Shoulder Arthroplasty  SURGEON:  Neville Barbone, MD  PHYSICIAN ASSISTANT: Hurshel Maidens, PA-C, present and scrubbed throughout the case, critical for completion in a timely fashion, and for retraction, instrumentation, and closure.  ANESTHESIA:   General with interscalene block using Exparel   ESTIMATED BLOOD LOSS: 150 mL  UNIQUE ASPECTS OF THE CASE: Bone quality was relatively poor.  She had a distal clavicle resection, which made axis superiorly more readily available.  I had to remove all of the retractors in order to get access to put the glenosphere down, and I was concerned about crushing the proximal humerus.  Ultimately I felt like that seated well, on the final reduction I had a little bit of a disconcerting click, I assessed thoroughly for acromial stress fracture or other periprosthetic fracture but did not find anything.  The ultimate implant felt stable.  I was potted proud just slightly although my head cut was on the low side, but the glenoid retractors hide caused some damage to the proximal humerus, so reconstructing that with the humeral component seemed appropriate.  PREOPERATIVE INDICATIONS:  Rebecca [name] is a  83 y.o. female with a diagnosis of left shoulder osteoarthritis with history of rotator cuff repair who failed conservative measures and elected for surgical management.    The risks benefits and alternatives were discussed with the patient preoperatively including but not limited to the risks of infection, bleeding, nerve injury, cardiopulmonary complications, the need for revision surgery, dislocation, brachial plexus palsy, incomplete relief of pain, among others, and the patient was willing to proceed.  OPERATIVE IMPLANTS:   Implant Name:  BASEPLATE GLENOSPHERE 25 - [id number] Type: Plate Inv. Item: BASEPLATE GLENOSPHERE 25 Serial No.:  Manufacturer: [employer](ORTH,TRAU,BIO,SG) Lot No.: [id number] LRB: Left No. Used: 1 Action: Implanted   Implant Name: Saddie Crane STD STRL - [id number] Type: Orthopedic Implant Inv. Item: GLENOID SPHERE STD STRL Serial No.:  Manufacturer: [employer](ORTH,TRAU,BIO,SG) Lot No.: [id number] LRB: Left No. Used: 1 Action: Implanted   Implant Name: SCREW LOCKING NS [id number] - [id number] Type: Screw Inv. Item: SCREW LOCKING NS [id number] Serial No.:  Manufacturer: [employer](ORTH,TRAU,BIO,SG) Lot No.: [id number] LRB: Left No. Used: 1 Action: Implanted   Implant Name: SCREW BONE LOCKING [id number] - [id number] Type: Screw Inv. Item: SCREW BONE LOCKING [id number] Serial No.:  Manufacturer: [employer](ORTH,TRAU,BIO,SG) Lot No.: [id number] LRB: Left No. Used: 1 Action: Implanted   Implant Name: SCREW BONE STRL [id number] - [id number] Type: Screw Inv. Item: SCREW BONE STRL [id number] Serial No.:  Manufacturer: [employer](ORTH,TRAU,BIO,SG) Lot No.: [id number] LRB: Left No. Used: 1 Action: Implanted   Implant Name: BEARING HUMERAL SHLDER 44M STD - [id number] Type: Shoulder Inv. Item: BEARING HUMERAL SHLDER 44M STD Serial No.:  Manufacturer: [employer](ORTH,TRAU,BIO,SG) Lot No.: [id number] LRB: Left No. Used: 1 Action: Implanted   Implant Name: TRAY HUM MINI SHOULDER +3 40 - [id number] Type: Joint Inv. Item: TRAY HUM MINI SHOULDER +3 40 Serial No.:  Manufacturer: [employer](ORTH,TRAU,BIO,SG) Lot No.: [id number] LRB: Left No. Used: 1 Action: Implanted   Implant Name: STEM HUMERAL STRL 12MMX14MM - [id number] Type: Stem Inv. Item: STEM HUMERAL STRL [id number] Serial No.:  Manufacturer: [employer](ORTH,TRAU,BIO,SG) Lot No.: [id number] LRB: Left No. Used: 1 Action: Implanted   OPERATIVE FINDINGS: Her supraspinatus was still present,  and I was actually  able to repair this with Vicryl back to the upper border the subscapularis at the completion of the case.  She had grade 4 chondral loss on the humeral head, and the pattern was somewhat superior.  She had moderate osteophyte formation circumferentially on the humeral head.  OPERATIVE PROCEDURE: The patient was brought to the operating room and placed in the supine position. General anesthesia was administered. IV antibiotics were given.  Time out was performed. The upper extremity was prepped and draped in usual sterile fashion. The patient was in a beachchair position. Deltopectoral approach was carried out. The biceps was tenodesed to the pectoralis tendon with #2 Maxbraid. The subscapularis was released off of the bone.   I then performed circumferential releases of the humerus, and then dislocated the head, and then reamed with the reamer to the above named size.  I then applied the jig, and cut the humeral head in 30 of retroversion, and then turned my attention to the glenoid.  Deep retractors were placed, and I resected the labrum, and then placed a guidepin into the center position on the glenoid, with slight inferior inclination. I then reamed over the guidepin, and this created a small metaphyseal cancellus blush inferiorly, removing just the cartilage to the subchondral bone superiorly. The base plate was selected and impacted place, and then I secured it centrally with a nonlocking screw, and I had excellent purchase both inferiorly and superiorly. I placed a short locking screws on anterior and posterior aspects.  I then turned my attention to the glenosphere, and impacted this into place, placing slight inferior offset (set on B).   The glenosphere was completely seated, and had engagement of the [facility] taper. I then turned my attention back to the humerus.  I sequentially broached, and then trialed, and was found to restore soft tissue tension, and it had 2 finger  tightness. Therefore the above named components were selected. The shoulder felt stable throughout functional motion.  Before I placed the real prosthesis I had also placed a total of 3 #5 Maxbraid through the humerus for later subscapularis repair.  I then impacted the real prosthesis into place, as well as the real humeral tray, and reduced the shoulder. The shoulder had excellent motion, and was stable, and I irrigated the wounds copiously.    I then used the Maxbraid suture to repair the subscapularis. This came down to bone.  I then irrigated the shoulder copiously once more, repaired the deltopectoral interval with Vicryl followed by subcutaneous Vicryl with Steri-Strips and sterile gauze for the skin. The patient was awakened and returned back in stable and satisfactory condition. There were no complications and She tolerated the procedure well.

## 2023-08-08 NOTE — Evaluation (Signed)
 Occupational Therapy Evaluation Patient Details Name: Rebecca [name] MRN: [id number] DOB: February 13, 1941 Today's Date: 08/08/2023   History of Present Illness   Patient is 83 y.o. female s/p L Reverse TSA on 08/08/23.  PMH significant for HTN, HLD, OA, osteoporosis, depression, anxiety, Lt UKA in 2013, ACDF in 2016.     Clinical Impressions PTA pt lives at home with son and was independent prior to this am surgery.  Education completed regarding compensatory strategies for ADL tasks and functional mobility, management of sling, L ROM per specified parameters in the order set as indicated below, positioning of operative arm in sitting and supine and edema control, including use of "Iceman" Cold Therapy machine. Caregiver present for education, written handouts provided and reviewed using Teach Back and pt/caregiver verbalized/demonstrated understanding. Due to the below listed deficits, pt requires min A assistance with ADL tasks and Supervision assist with functional mobility. Caregiver will be able to provide necessary level of assistance at discharge. Pt to follow up with MD to progress rehab of the operative shoulder.      If plan is discharge home, recommend the following:   Assist for transportation;Help with stairs or ramp for entrance;A little help with bathing/dressing/bathroom;A little help with walking and/or transfers;Assistance with cooking/housework     Functional Status Assessment   Patient has had a recent decline in their functional status and demonstrates the ability to make significant improvements in function in a reasonable and predictable amount of time.     Equipment Recommendations   None recommended by OT      Precautions/Restrictions   Precautions Precautions: Shoulder No shoulder ROM allowed; elbow/wrist/hand AROM only.  Required Braces or Orthoses: Sling Restrictions Weight Bearing Restrictions Per Provider Order: Yes LUE Weight Bearing Per  Provider Order: Non weight bearing     Mobility Bed Mobility               General bed mobility comments: n/a- patient in recliner for session    Transfers Overall transfer level: Modified independent                        Balance Overall balance assessment: No apparent balance deficits (not formally assessed)                                         ADL either performed or assessed with clinical judgement   ADL Overall ADL's : Needs assistance/impaired  Per orders, L shoulder parameters as follows for ADL tasks:  No shoulder ROM; elbow/wrist/hand ROM only. While moving within specified parameters, pt/caregiver instructed on bathing and how to donn/doff shirt, placing operative arm through sleeve first when donning and off last when doffing.Pt/caregiver educated on compensatory strategies for LB ADL and strategies to reduce risk of falls.  Pt/caregiver educated on donning/doffing sling and to wear the sling at all times with the exception of ADL, and to loosen the neck strap of the sling when the operative arm is in a supported position when sitting. In sitting or supine, pt instructed to have a pillow behind and under their operative arm to provide support. If assist needed with ambulation, caregiver educated on the importance of walking on pt's non-operative side.  Education regarding use of "IceMan" Cold Therapy completed, including the importance of using a barrier on the shoulder prior to positioning the wrap-on pad. Pt/caregiver verbalized/demonstrated understanding. Teach Back used  while caregiver assisted with dressing pt and positioning "wrap-on pad" to facilitate DC. Son with teach back and patient able to amb to and from bathroom this session without issues.                                             Vision Baseline Vision/History: 0 No visual deficits;1 Wears glasses Vision Assessment?: No apparent visual deficits             Pertinent Vitals/Pain Pain Assessment Pain Assessment: No/denies pain     Extremity/Trunk Assessment Upper Extremity Assessment Upper Extremity Assessment: Right hand dominant;LUE deficits/detail LUE Deficits / Details: L UE n block still active but AAROM L elbow, wrist, hand WFL's   Lower Extremity Assessment Lower Extremity Assessment: Overall WFL for tasks assessed   Cervical / Trunk Assessment Cervical / Trunk Assessment: Normal   Communication Communication Communication: No apparent difficulties   Cognition Arousal: Alert Behavior During Therapy: WFL for tasks assessed/performed Cognition: No apparent impairments                               Following commands: Intact       Cueing  General Comments   Cueing Techniques: Verbal cues  L shoulder post op dressing intact   Exercises Exercises: Shoulder Shoulder Exercises Elbow Flexion: AAROM, 10 reps Elbow Extension: 10 reps, AAROM Wrist Flexion: AAROM, 10 reps Wrist Extension: AAROM, 10 reps Digit Composite Flexion: AAROM, 10 reps Composite Extension: AAROM, 10 reps   Shoulder Instructions Shoulder Instructions Donning/doffing shirt without moving shoulder: Caregiver independent with task;Patient able to independently direct caregiver Donning/doffing sling/immobilizer: Caregiver independent with task;Patient able to independently direct caregiver Correct positioning of sling/immobilizer: Caregiver independent with task;Patient able to independently direct caregiver ROM for elbow, wrist and digits of operated UE: Caregiver independent with task;Patient able to independently direct caregiver Sling wearing schedule (on at all times/off for ADL's): Caregiver independent with task;Patient able to independently direct caregiver Proper positioning of operated UE when showering: Caregiver independent with task;Patient able to independently direct caregiver Positioning of UE while sleeping: Caregiver  independent with task;Patient able to independently direct caregiver    Home Living Family/patient expects to be discharged to:: Private residence Living Arrangements: Children Available Help at Discharge: Family Type of Home: House Home Access: Stairs to enter Secretary/administrator of Steps: 2 Entrance Stairs-Rails: None Home Layout: Two level Alternate Level Stairs-Number of Steps: FF Alternate Level Stairs-Rails: Can reach both;Right;Left Bathroom Shower/Tub: Tub/shower unit;Curtain   Bathroom Toilet: Handicapped height Bathroom Accessibility: Yes How Accessible: Accessible via walker Home Equipment: Agricultural consultant (2 wheels);Cane - single point;BSC/3in1;Shower seat;Hand held shower head;Grab bars - tub/shower   Additional Comments: grab bars in hallways as well      Prior Functioning/Environment Prior Level of Function : Independent/Modified Independent                    OT Problem List: Impaired UE functional use    AM-PAC OT "6 Clicks" Daily Activity     Outcome Measure Help from another person eating meals?: None Help from another person taking care of personal grooming?: None Help from another person toileting, which includes using toliet, bedpan, or urinal?: A Little Help from another person bathing (including washing, rinsing, drying)?: A Little Help from another person to put on and taking off  regular upper body clothing?: A Little Help from another person to put on and taking off regular lower body clothing?: A Little 6 Click Score: 20   End of Session Equipment Utilized During Treatment: Gait belt Nurse Communication: Mobility status  Activity Tolerance: Patient tolerated treatment well Patient left: in chair;with call bell/phone within reach;with nursing/sitter in room;with family/visitor present  OT Visit Diagnosis: Other (comment) (UE dysfunction)                Time: 1450-1530 OT Time Calculation (min): 40 min Charges:  OT General  Charges $OT Visit: 1 Visit OT Evaluation $OT Eval Low Complexity: 1 Low OT Treatments $Self Care/Home Management : 8-22 mins Ebany Bowermaster OT/L Acute Rehabilitation Department  [phone]  08/08/2023, 4:47 PM

## 2023-08-08 NOTE — Anesthesia Procedure Notes (Signed)
 Anesthesia Regional Block: Interscalene brachial plexus block   Pre-Anesthetic Checklist: , timeout performed,  Correct Patient, Correct Site, Correct Laterality,  Correct Procedure, Correct Position, site marked,  Risks and benefits discussed,  Surgical consent,  Pre-op evaluation,  At surgeon's request and post-op pain management  Laterality: Left  Prep: Dura Prep       Needles:  Injection technique: Single-shot  Needle Type: Echogenic Stimulator Needle     Needle Length: 5cm  Needle Gauge: 20     Additional Needles:   Procedures:,,,, ultrasound used (permanent image in chart),,    Narrative:  Start time: 08/08/2023 10:55 AM End time: 08/08/2023 11:00 AM Injection made incrementally with aspirations every 5 mL.  Performed by: Personally  Anesthesiologist: Micheal Agent, DO  Additional Notes: Patient identified. Risks/Benefits/Options discussed with patient including but not limited to bleeding, infection, nerve damage, failed block, incomplete pain control. Patient expressed understanding and wished to proceed. All questions were answered. Sterile technique was used throughout the entire procedure. Please see nursing notes for vital signs. Aspirated in 5cc intervals with injection for negative confirmation. Patient was given instructions on fall risk and not to get out of bed. All questions and concerns addressed with instructions to call with any issues or inadequate analgesia.

## 2023-08-08 NOTE — Discharge Instructions (Signed)
 POST-OPERATIVE INSTRUCTIONS - TOTAL SHOULDER REPLACEMENT    WOUND CARE You may leave the operative dressing in place until your follow-up appointment. KEEP THE INCISIONS CLEAN AND DRY. There may be a small amount of fluid/bleeding leaking at the surgical site. This is normal after surgery.  If it fills with liquid or blood please call us  immediately to change it for you. Use the provided ice machine or Ice packs as often as possible for the first 3-4 days, then as needed for pain relief.   Keep a layer of cloth or a shirt between your skin and the cooling unit to prevent frost bite as it can get very cold.  SHOWERING: - The dressing is not water proof, please keep covered when showering - You may remove the sling for showering - Gently pat the area dry.  - Do not soak the shoulder in water.  - Do not go swimming in the pool or ocean until your incision has completely healed (about 4-6 weeks after surgery) - KEEP THE INCISIONS CLEAN AND DRY.  EXERCISES Wear the sling at all times  You may remove the sling for showering, but keep the arm across the chest or in a secondary sling.   It is ok to come out of your sling if your are sitting and have assistance for eating.   Do not lift anything heavier than 1 pound until we discuss it further in clinic. It is normal for your fingers/hand to become more swollen after surgery and discolored from bruising.   This will resolve over the first few weeks usually after surgery. Please continue to ambulate and do not stay sitting or lying for too long.  Perform foot and wrist pumps to assist in circulation.  REGIONAL ANESTHESIA (NERVE BLOCKS) The anesthesia team may have performed a nerve block for you this is a great tool used to minimize pain.   The block may start wearing off overnight (between 8-24 hours postop) When the block wears off, your pain may go from nearly zero to the pain you would have had postop without the block. This is an abrupt  transition but nothing dangerous is happening.   This can be a challenging period but utilize your as needed pain medications to try and manage this period. We suggest you use the pain medication the first night prior to going to bed, to ease this transition.  You may take an extra dose of narcotic when this happens if needed  FOLLOW-UP If you develop a Fever (>101.5), Redness or Drainage from the surgical incision site, please call our office to arrange for an evaluation. Please call the office to schedule a follow-up appointment for a wound check, 7-10 days post-operatively.  IF YOU HAVE ANY QUESTIONS, PLEASE FEEL FREE TO CALL OUR OFFICE.  HELPFUL INFORMATION  Your arm will be in a sling following surgery.   You may be more comfortable sleeping in a semi-seated position the first few nights following surgery.  Keep a pillow propped under the elbow and forearm for comfort.  If you have a recliner type of chair it might be beneficial.  If not that is fine too, but it would be helpful to sleep propped up with pillows behind your operated shoulder as well under your elbow and forearm.  This will reduce pulling on the suture lines.  When dressing, put your operative arm in the sleeve first.  When getting undressed, take your operative arm out last.  Loose fitting, button-down shirts are recommended.  In most states it is against the law to drive while your arm is in a sling. And certainly against the law to drive while taking narcotics.  You may return to work/school in the next couple of days when you feel up to it. Desk work and typing in the sling is fine.  We suggest you use the pain medication the first night prior to going to bed, in order to ease any pain when the anesthesia wears off. You should avoid taking pain medications on an empty stomach as it will make you nauseous.  You should wean off your narcotic medicines as soon as you are able.     Most patients will be off narcotics  before their first postop appointment.   Do not drink alcoholic beverages or take illicit drugs when taking pain medications.  Pain medication may make you constipated.  Below are a few solutions to try in this order: Decrease the amount of pain medication if you aren't having pain. Drink lots of decaffeinated fluids. Drink prune juice and/or each dried prunes  If the first 3 don't work start with additional solutions Take Colace - an over-the-counter stool softener Take Senokot - an over-the-counter laxative Take Miralax  - a stronger over-the-counter laxative

## 2023-08-08 NOTE — Transfer of Care (Signed)
 Immediate Anesthesia Transfer of Care Note  Patient: Rebecca [name] [facility]  Procedure(s) Performed: ARTHROPLASTY, SHOULDER, TOTAL, REVERSE (Left: Shoulder)  Patient Location: PACU  Anesthesia Type:GA combined with regional for post-op pain  Level of Consciousness: awake and patient cooperative  Airway & Oxygen Therapy: Patient Spontanous Breathing  Post-op Assessment: Report given to RN and Post -op Vital signs reviewed and stable  Post vital signs: Reviewed and stable  Last Vitals:  Vitals Value Taken Time  BP 111/98 08/08/23 1334  Temp    Pulse 76 08/08/23 1340  Resp 24 08/08/23 1340  SpO2 100 % 08/08/23 1340  Vitals shown include unfiled device data.  Last Pain:  Vitals:   08/08/23 0911  TempSrc:   PainSc: 0-No pain         Complications: No notable events documented.

## 2023-08-08 NOTE — Anesthesia Preprocedure Evaluation (Signed)
 Anesthesia Evaluation  Patient identified by MRN, date of birth, ID band Patient awake    Reviewed: Allergy & Precautions, NPO status , Patient's Chart, lab work & pertinent test results  Airway Mallampati: II  TM Distance: >3 FB Neck ROM: Full    Dental no notable dental hx.    Pulmonary neg pulmonary ROS   Pulmonary exam normal        Cardiovascular hypertension,  Rhythm:Regular Rate:Normal     Neuro/Psych   Anxiety Depression       GI/Hepatic negative GI ROS, Neg liver ROS,,,  Endo/Other  negative endocrine ROS    Renal/GU negative Renal ROS  negative genitourinary   Musculoskeletal  (+) Arthritis ,  Fibromyalgia -  Abdominal Normal abdominal exam  (+)   Peds  Hematology Lab Results      Component                Value               Date                      WBC                      6.2                 07/27/2023                HGB                      12.2                07/27/2023                HCT                      38.3                07/27/2023                MCV                      95.8                07/27/2023                PLT                      159                 07/27/2023              Anesthesia Other Findings   Reproductive/Obstetrics                             Anesthesia Physical Anesthesia Plan  ASA: 2  Anesthesia Plan: General and Regional   Post-op Pain Management: Regional block*   Induction: Intravenous  PONV Risk Score and Plan: 3 and Ondansetron  and Dexamethasone   Airway Management Planned: Mask and Oral ETT  Additional Equipment: None  Intra-op Plan:   Post-operative Plan: Extubation in OR  Informed Consent: I have reviewed the patients History and Physical, chart, labs and discussed the procedure including the risks, benefits and alternatives for the proposed anesthesia with the patient or authorized representative who has indicated his/her  understanding and acceptance.  Dental advisory given  Plan Discussed with: CRNA  Anesthesia Plan Comments:        Anesthesia Quick Evaluation

## 2023-08-09 NOTE — Anesthesia Postprocedure Evaluation (Signed)
 Anesthesia Post Note  Patient: Idelle Majors [facility]  Procedure(s) Performed: ARTHROPLASTY, SHOULDER, TOTAL, REVERSE (Left: Shoulder)     Patient location during evaluation: PACU Anesthesia Type: Regional and General Level of consciousness: awake and alert Pain management: pain level controlled Vital Signs Assessment: post-procedure vital signs reviewed and stable Respiratory status: spontaneous breathing, nonlabored ventilation, respiratory function stable and patient connected to nasal cannula oxygen Cardiovascular status: blood pressure returned to baseline and stable Postop Assessment: no apparent nausea or vomiting Anesthetic complications: no   No notable events documented.  Last Vitals:  Vitals:   08/08/23 1430 08/08/23 1445  BP: (!) 143/87 (!) 157/79  Pulse: 71 76  Resp: 13   Temp:    SpO2: 98% 97%    Last Pain:  Vitals:   08/08/23 1445  TempSrc:   PainSc: 0-No pain                 Theotis Flake P Shiv Shuey

## 2023-08-10 ENCOUNTER — Encounter (HOSPITAL_COMMUNITY): Payer: Self-pay | Admitting: Orthopedic Surgery

## 2023-08-11 DIAGNOSIS — M19012 Primary osteoarthritis, left shoulder: Secondary | ICD-10-CM | POA: Diagnosis not present

## 2023-08-11 DIAGNOSIS — M6281 Muscle weakness (generalized): Secondary | ICD-10-CM | POA: Diagnosis not present

## 2023-08-11 DIAGNOSIS — M25612 Stiffness of left shoulder, not elsewhere classified: Secondary | ICD-10-CM | POA: Diagnosis not present

## 2023-08-15 DIAGNOSIS — M19012 Primary osteoarthritis, left shoulder: Secondary | ICD-10-CM | POA: Diagnosis not present

## 2023-08-15 DIAGNOSIS — M6281 Muscle weakness (generalized): Secondary | ICD-10-CM | POA: Diagnosis not present

## 2023-08-15 DIAGNOSIS — M25612 Stiffness of left shoulder, not elsewhere classified: Secondary | ICD-10-CM | POA: Diagnosis not present

## 2023-08-23 DIAGNOSIS — M6281 Muscle weakness (generalized): Secondary | ICD-10-CM | POA: Diagnosis not present

## 2023-08-23 DIAGNOSIS — M19012 Primary osteoarthritis, left shoulder: Secondary | ICD-10-CM | POA: Diagnosis not present

## 2023-08-23 DIAGNOSIS — M25612 Stiffness of left shoulder, not elsewhere classified: Secondary | ICD-10-CM | POA: Diagnosis not present

## 2023-08-24 ENCOUNTER — Telehealth: Payer: Self-pay

## 2023-08-24 NOTE — Telephone Encounter (Signed)
 Noted!  Let's wait for the labs and see if we can reduce the methimazole  dose more.  Does she feel that the rash is caused by methimazole ?

## 2023-08-24 NOTE — Telephone Encounter (Signed)
 Pt called stating that she is having that itching in the back of her head again. She has been taking 1.5 tabs of Methimazole . She is coming in for her repeat labs tomorrow.

## 2023-08-25 ENCOUNTER — Other Ambulatory Visit

## 2023-08-25 DIAGNOSIS — M19012 Primary osteoarthritis, left shoulder: Secondary | ICD-10-CM | POA: Diagnosis not present

## 2023-08-25 DIAGNOSIS — M6281 Muscle weakness (generalized): Secondary | ICD-10-CM | POA: Diagnosis not present

## 2023-08-25 DIAGNOSIS — M25612 Stiffness of left shoulder, not elsewhere classified: Secondary | ICD-10-CM | POA: Diagnosis not present

## 2023-08-25 DIAGNOSIS — E0500 Thyrotoxicosis with diffuse goiter without thyrotoxic crisis or storm: Secondary | ICD-10-CM | POA: Diagnosis not present

## 2023-08-25 NOTE — Telephone Encounter (Signed)
 Pt just came today for labs. Just waiting on them to result.

## 2023-08-25 NOTE — Telephone Encounter (Signed)
 Ok, lets have her back for a new set of labs: TSH, free T4, free T3.  Can you please order these?

## 2023-08-25 NOTE — Telephone Encounter (Signed)
 I called and spoke with the patient and she states that she does not think its coming from the medication because this was a symptom before she started taking the medication and it was coming form her Thyroid .

## 2023-08-26 LAB — T4, FREE: Free T4: 0.8 ng/dL (ref 0.8–1.8)

## 2023-08-26 LAB — T3, FREE: T3, Free: 3 pg/mL (ref 2.3–4.2)

## 2023-08-26 LAB — TSH: TSH: 5.25 m[IU]/L — ABNORMAL HIGH (ref 0.40–4.50)

## 2023-08-28 ENCOUNTER — Ambulatory Visit: Payer: Self-pay | Admitting: Internal Medicine

## 2023-08-28 DIAGNOSIS — E0500 Thyrotoxicosis with diffuse goiter without thyrotoxic crisis or storm: Secondary | ICD-10-CM

## 2023-08-29 DIAGNOSIS — M25612 Stiffness of left shoulder, not elsewhere classified: Secondary | ICD-10-CM | POA: Diagnosis not present

## 2023-08-29 DIAGNOSIS — M19012 Primary osteoarthritis, left shoulder: Secondary | ICD-10-CM | POA: Diagnosis not present

## 2023-08-29 DIAGNOSIS — M6281 Muscle weakness (generalized): Secondary | ICD-10-CM | POA: Diagnosis not present

## 2023-08-30 ENCOUNTER — Other Ambulatory Visit: Payer: Self-pay | Admitting: Internal Medicine

## 2023-08-30 DIAGNOSIS — E0500 Thyrotoxicosis with diffuse goiter without thyrotoxic crisis or storm: Secondary | ICD-10-CM

## 2023-08-31 DIAGNOSIS — M19012 Primary osteoarthritis, left shoulder: Secondary | ICD-10-CM | POA: Diagnosis not present

## 2023-08-31 DIAGNOSIS — M6281 Muscle weakness (generalized): Secondary | ICD-10-CM | POA: Diagnosis not present

## 2023-08-31 DIAGNOSIS — M25612 Stiffness of left shoulder, not elsewhere classified: Secondary | ICD-10-CM | POA: Diagnosis not present

## 2023-09-12 DIAGNOSIS — M6281 Muscle weakness (generalized): Secondary | ICD-10-CM | POA: Diagnosis not present

## 2023-09-12 DIAGNOSIS — M25612 Stiffness of left shoulder, not elsewhere classified: Secondary | ICD-10-CM | POA: Diagnosis not present

## 2023-09-12 DIAGNOSIS — M19012 Primary osteoarthritis, left shoulder: Secondary | ICD-10-CM | POA: Diagnosis not present

## 2023-09-14 DIAGNOSIS — M25612 Stiffness of left shoulder, not elsewhere classified: Secondary | ICD-10-CM | POA: Diagnosis not present

## 2023-09-14 DIAGNOSIS — M6281 Muscle weakness (generalized): Secondary | ICD-10-CM | POA: Diagnosis not present

## 2023-09-14 DIAGNOSIS — M19012 Primary osteoarthritis, left shoulder: Secondary | ICD-10-CM | POA: Diagnosis not present

## 2023-09-19 DIAGNOSIS — M6281 Muscle weakness (generalized): Secondary | ICD-10-CM | POA: Diagnosis not present

## 2023-09-19 DIAGNOSIS — M19012 Primary osteoarthritis, left shoulder: Secondary | ICD-10-CM | POA: Diagnosis not present

## 2023-09-19 DIAGNOSIS — M25612 Stiffness of left shoulder, not elsewhere classified: Secondary | ICD-10-CM | POA: Diagnosis not present

## 2023-09-20 ENCOUNTER — Other Ambulatory Visit

## 2023-09-20 DIAGNOSIS — H43393 Other vitreous opacities, bilateral: Secondary | ICD-10-CM | POA: Diagnosis not present

## 2023-09-20 DIAGNOSIS — E0500 Thyrotoxicosis with diffuse goiter without thyrotoxic crisis or storm: Secondary | ICD-10-CM | POA: Diagnosis not present

## 2023-09-20 DIAGNOSIS — M19012 Primary osteoarthritis, left shoulder: Secondary | ICD-10-CM | POA: Diagnosis not present

## 2023-09-20 DIAGNOSIS — M19011 Primary osteoarthritis, right shoulder: Secondary | ICD-10-CM | POA: Diagnosis not present

## 2023-09-20 DIAGNOSIS — H40033 Anatomical narrow angle, bilateral: Secondary | ICD-10-CM | POA: Diagnosis not present

## 2023-09-21 ENCOUNTER — Telehealth: Payer: Self-pay

## 2023-09-21 ENCOUNTER — Ambulatory Visit: Payer: Self-pay | Admitting: Internal Medicine

## 2023-09-21 DIAGNOSIS — M6281 Muscle weakness (generalized): Secondary | ICD-10-CM | POA: Diagnosis not present

## 2023-09-21 DIAGNOSIS — M19012 Primary osteoarthritis, left shoulder: Secondary | ICD-10-CM | POA: Diagnosis not present

## 2023-09-21 DIAGNOSIS — M25612 Stiffness of left shoulder, not elsewhere classified: Secondary | ICD-10-CM | POA: Diagnosis not present

## 2023-09-21 LAB — TSH: TSH: 0.27 m[IU]/L — ABNORMAL LOW (ref 0.40–4.50)

## 2023-09-21 LAB — T4, FREE: Free T4: 1.1 ng/dL (ref 0.8–1.8)

## 2023-09-21 LAB — T3, FREE: T3, Free: 3.6 pg/mL (ref 2.3–4.2)

## 2023-09-21 NOTE — Telephone Encounter (Signed)
 Copied from CRM [id number]. Topic: General - Other >> Sep 21, 2023  9:45 AM Jasmin G wrote: Reason for CRM: Patient called to get in contact with PCP about upcoming surgery, patient stated she was advised to give a call.

## 2023-09-21 NOTE — Telephone Encounter (Signed)
 Spoke w/ Pt- she denies CP, SHOB, or edema. Informed PCP out of office until tomorrow, I will have him sign form when he returns. Pt verbalized understanding.

## 2023-09-21 NOTE — Telephone Encounter (Signed)
 Patient tolerated left shoulder arthroplasty 08/08/2023. As long as she is not having chest pain or difficulty breathing or lower extremity edema she is clear from my side.  Recommend clearance from endocrinology as well.

## 2023-09-21 NOTE — Telephone Encounter (Signed)
 Received surgery clearance form from [facility], Pt is needing R reverse total shoulder arthroplasty with Dr. Josefina, surgery date TBD. Please advise if Pt needs appt to complete form.

## 2023-09-22 NOTE — Telephone Encounter (Signed)
 Form faxed back to ATTN: Maeola Divers at [phone], form sent for scanning.

## 2023-09-22 NOTE — Telephone Encounter (Signed)
 Received fax confirmation

## 2023-09-27 DIAGNOSIS — M25612 Stiffness of left shoulder, not elsewhere classified: Secondary | ICD-10-CM | POA: Diagnosis not present

## 2023-09-27 DIAGNOSIS — M19012 Primary osteoarthritis, left shoulder: Secondary | ICD-10-CM | POA: Diagnosis not present

## 2023-09-27 DIAGNOSIS — M6281 Muscle weakness (generalized): Secondary | ICD-10-CM | POA: Diagnosis not present

## 2023-09-28 ENCOUNTER — Telehealth: Payer: Self-pay

## 2023-09-28 DIAGNOSIS — M6281 Muscle weakness (generalized): Secondary | ICD-10-CM | POA: Diagnosis not present

## 2023-09-28 DIAGNOSIS — M25612 Stiffness of left shoulder, not elsewhere classified: Secondary | ICD-10-CM | POA: Diagnosis not present

## 2023-09-28 DIAGNOSIS — M19012 Primary osteoarthritis, left shoulder: Secondary | ICD-10-CM | POA: Diagnosis not present

## 2023-09-28 NOTE — Telephone Encounter (Signed)
 Med rec and consent done.    Patient Consent for Virtual Visit        Rebecca [name] [facility] has provided verbal consent on 09/28/2023 for a virtual visit (video or telephone).   CONSENT FOR VIRTUAL VISIT FOR:  Rebecca [name]  By participating in this virtual visit I agree to the following:  I hereby voluntarily request, consent and authorize [facility] and its employed or contracted physicians, physician assistants, nurse practitioners or other licensed health care professionals (the Practitioner), to provide me with telemedicine health care services (the "Services) as deemed necessary by the treating Practitioner. I acknowledge and consent to receive the Services by the Practitioner via telemedicine. I understand that the telemedicine visit will involve communicating with the Practitioner through live audiovisual communication technology and the disclosure of certain medical information by electronic transmission. I acknowledge that I have been given the opportunity to request an in-person assessment or other available alternative prior to the telemedicine visit and am voluntarily participating in the telemedicine visit.  I understand that I have the right to withhold or withdraw my consent to the use of telemedicine in the course of my care at any time, without affecting my right to future care or treatment, and that the Practitioner or I may terminate the telemedicine visit at any time. I understand that I have the right to inspect all information obtained and/or recorded in the course of the telemedicine visit and may receive copies of available information for a reasonable fee.  I understand that some of the potential risks of receiving the Services via telemedicine include:  Delay or interruption in medical evaluation due to technological equipment failure or disruption; Information transmitted may not be sufficient (e.g. poor resolution of images) to allow for appropriate medical  decision making by the Practitioner; and/or  In rare instances, security protocols could fail, causing a breach of personal health information.  Furthermore, I acknowledge that it is my responsibility to provide information about my medical history, conditions and care that is complete and accurate to the best of my ability. I acknowledge that Practitioner's advice, recommendations, and/or decision may be based on factors not within their control, such as incomplete or inaccurate data provided by me or distortions of diagnostic images or specimens that may result from electronic transmissions. I understand that the practice of medicine is not an exact science and that Practitioner makes no warranties or guarantees regarding treatment outcomes. I acknowledge that a copy of this consent can be made available to me via my patient portal [facility] MyChart), or I can request a printed copy by calling the office of [facility].    I understand that my insurance will be billed for this visit.   I have read or had this consent read to me. I understand the contents of this consent, which adequately explains the benefits and risks of the Services being provided via telemedicine.  I have been provided ample opportunity to ask questions regarding this consent and the Services and have had my questions answered to my satisfaction. I give my informed consent for the services to be provided through the use of telemedicine in my medical care

## 2023-09-28 NOTE — Telephone Encounter (Signed)
 S/W pt and scheduled TELE Preop appt 10/04/23. Med rec and consent done.

## 2023-09-28 NOTE — Telephone Encounter (Signed)
   Name: Rebecca [name] [name]  DOB: [birth year]  MRN: [id number]  Primary Cardiologist: None   Preoperative team, please contact this patient and set up a phone call appointment for further preoperative risk assessment. Please obtain consent and complete medication review. Thank you for your help.  I confirm that guidance regarding antiplatelet and oral anticoagulation therapy has been completed and, if necessary, noted below.  None requested  I also confirmed the patient resides in the state of [place] . As per [facility] Medical Board telemedicine laws, the patient must reside in the state in which the provider is licensed.   [name], NP 09/28/2023, 12:00 PM [facility]

## 2023-09-28 NOTE — Telephone Encounter (Signed)
   Pre-operative Risk Assessment    Patient Name: Rebecca [name] [facility]  DOB: February 12, 1941 MRN: [id number]   Date of last office visit: 12/13/22 [name], MD Date of next office visit: NONE   Request for Surgical Clearance    Procedure:  RIGHT REVERSE TOTAL SHOULDER REPLACEMENT  Date of Surgery:  Clearance 11/21/23                                Surgeon:  FONDA SHAUNNA OLMSTED, MD Surgeon's Group or Practice Name:  [facility] Phone number:  [phone]  [phone] Fax number:  [phone]  ATTN: MAEOLA DIVERS   Type of Clearance Requested:   - Medical    Type of Anesthesia:  General WITH INTERSCALENE BLOCK   Additional requests/questions:    SignedLucie DELENA Ku   09/28/2023, 8:41 AM

## 2023-10-04 ENCOUNTER — Encounter: Payer: Self-pay | Admitting: Nurse Practitioner

## 2023-10-04 ENCOUNTER — Ambulatory Visit: Attending: Internal Medicine | Admitting: Nurse Practitioner

## 2023-10-04 DIAGNOSIS — Z01810 Encounter for preprocedural cardiovascular examination: Secondary | ICD-10-CM | POA: Diagnosis not present

## 2023-10-04 NOTE — Progress Notes (Signed)
 Virtual Visit via Telephone Note   Because of Creasie Lacosse [facility] co-morbid illnesses, she is at least at moderate risk for complications without adequate follow up.  This format is felt to be most appropriate for this patient at this time.  Due to technical limitations with video connection Web designer), today's appointment will be conducted as an audio only telehealth visit, and Areebah Meinders [facility] verbally agreed to proceed in this manner.   All issues noted in this document were discussed and addressed.  No physical exam could be performed with this format.  Evaluation Performed:  Preoperative cardiovascular risk assessment _____________   Date:  10/04/2023   Patient ID:  BRENN GATTON, DOB 10-27-1940, MRN [id number] Patient Location:  Home Provider location:   Office  Primary Care Provider:  Amon Aloysius BRAVO, MD Primary Cardiologist:  None  Chief Complaint / Patient Profile   83 y.o. y/o female with a h/o orthostatic hypotension who is pending right reverse total shoulder replacement with Dr. Josefina on 9/9 and presents today for telephonic preoperative cardiovascular risk assessment.  History of Present Illness    QUINN BARTLING is a 83 y.o. female who presents via audio/video conferencing for a telehealth visit today.  Pt was last seen in cardiology clinic on 12/13/22 by Dr. Alveta.  At that time Yetta HERO Ricks was doing well.  The patient is now pending procedure as outlined above. Since her last visit, she denies chest pain, shortness of breath, lower extremity edema, fatigue, palpitations, melena, hematuria, hemoptysis, diaphoresis, weakness, presyncope, syncope, orthopnea, and PND. She is active at home with regular house and yard work and is able to achieve > 4 METS activity without concerning cardiac symptoms.  Past Medical History    Past Medical History:  Diagnosis Date   Anxiety    Arthritis    degenerative lumbar spine    Chronic neck pain    Closed fracture  of base of fifth metatarsal bone 01/2018   Left   Complication of anesthesia    at one time my blood pressure dropped -lumbar - on bp med at that time   Depression    Dysphagia    esophageal stretched 9-14    Fibromyalgia    Graves disease    History of blood transfusion 2009   post - lumbar fusion    Hyperlipidemia    Hypertension    No longer takes med.   Osteoarthritis of left shoulder region    Mild, Dr. Josefina   Osteoporosis    Past Surgical History:  Procedure Laterality Date   ABDOMINAL HYSTERECTOMY     ANTERIOR CERVICAL DECOMP/DISCECTOMY FUSION N/A 11/20/2014   Procedure: ANTERIOR CERVICAL DECOMPRESSION/DISCECTOMY FUSION 2 LEVELS;  Surgeon: Oneil Priestly, MD;  Location: [facility] OR;  Service: Orthopedics;  Laterality: N/A;  Anterior cervical decompression fusion, cervical 3-4, cervical 4-5 with instrumentation and allograft   arm surgery Bilateral    transposed ulner nerve 10 (dr. Camella)   BACK SURGERY  2009   Dr Onetha SANES SURGERY  01/2014   Dr Onetha   carpal tunnell  2006   bilateral   CHOLECYSTECTOMY     KNEE SURGERY  11/2009   right,  scope; left knee   NECK SURGERY  05/2008   Dr Onetha   OOPHORECTOMY Bilateral    PARTIAL KNEE ARTHROPLASTY  01/17/2012   LEFT KNEE   PARTIAL KNEE ARTHROPLASTY  01/17/2012   Procedure: UNICOMPARTMENTAL KNEE;  Surgeon: Fonda SHAUNNA Josefina, MD;  Location: [facility] OR;  Service: Orthopedics;  Laterality: Left;   PARTIAL KNEE ARTHROPLASTY Right 04/21/2020   Procedure: UNICOMPARTMENTAL KNEE;  Surgeon: Josefina Chew, MD;  Location: [facility] ORS;  Service: Orthopedics;  Laterality: Right;   REVERSE SHOULDER ARTHROPLASTY Left 08/08/2023   Procedure: ARTHROPLASTY, SHOULDER, TOTAL, REVERSE;  Surgeon: Josefina Chew, MD;  Location: [facility] ORS;  Service: Orthopedics;  Laterality: Left;   RHINOPLASTY  1980   SHOULDER SURGERY Bilateral    x 2/Dr. Carlos   thumb surgery Right 07/2022   Dr Camella   TONSILLECTOMY      Allergies  Allergies  Allergen Reactions    Tape Itching and Other (See Comments)    White adhesive tape -blisters   Alendronate Sodium Other (See Comments)    jaw pain   Oxycodone -Acetaminophen  Hives   Cortisone     Pt received cortisone shot, and became very flushed, and hot, blisters, raw areas around lips & ears    Penicillins Swelling and Rash    Home Medications    Prior to Admission medications   Medication Sig Start Date End Date Taking? Authorizing Provider  acetaminophen  (TYLENOL ) 500 MG tablet Take 1,000 mg by mouth in the morning and at bedtime.    [provider]  clonazePAM  (KLONOPIN ) 0.5 MG tablet Take 1 tablet (0.5 mg total) by mouth 3 (three) times daily as needed for anxiety. 05/09/23   Paz, Jose E, MD  cyanocobalamin  (VITAMIN B12) 1000 MCG tablet Take 1,000 mcg by mouth daily.    [provider]  denosumab  (PROLIA ) 60 MG/ML SOSY injection Inject 60 mg into the skin every 6 (six) months. Dx code: M81.0.  pt has appt on 05/17/23 05/03/23   Amon Aloysius BRAVO, MD  escitalopram  (LEXAPRO ) 10 MG tablet Take 1 tablet (10 mg total) by mouth daily. 05/24/23   Paz, Jose E, MD  loratadine (CLARITIN) 10 MG tablet Take 10 mg by mouth daily.    [provider]  melatonin 5 MG TABS Take 5 mg by mouth at bedtime as needed (sleep).    [provider]  methimazole  (TAPAZOLE ) 5 MG tablet Take 1 tablet by mouth in the morning and half a tablet in the afternoon. 07/19/23   Trixie File, MD  Multiple Vitamin (MULTIVITAMIN) tablet Take 1 tablet by mouth daily.    [provider]  mupirocin  ointment (BACTROBAN ) 2 % 1 Application 2 (two) times daily.    [provider]  Propylene Glycol (SYSTANE BALANCE) 0.6 % SOLN Place 1 drop into both eyes daily.    [provider]  simvastatin  (ZOCOR ) 40 MG tablet Take 1 tablet (40 mg total) by mouth at bedtime. 05/24/23   Paz, Jose E, MD  vitamin C (ASCORBIC ACID) 500 MG tablet Take 500 mg by mouth daily.    [provider]     Physical Exam    Vital Signs:  AAHANA ELZA does not have vital signs available for review today.  Given telephonic nature of communication, physical exam is limited. AAOx3. NAD. Normal affect.  Speech and respirations are unlabored.  Accessory Clinical Findings    None  Assessment & Plan    1.  Preoperative Cardiovascular Risk Assessment: According to the Revised Cardiac Risk Index (RCRI), her Perioperative Risk of Major Cardiac Event is (%): 0.9. Her Functional Capacity in METs is: 6.61 according to the Duke Activity Status Index (DASI). The patient is doing well from a cardiac perspective. Therefore, based on ACC/AHA guidelines, the patient would be at acceptable risk for the planned  procedure without further cardiovascular testing.   The patient was advised that if she develops new symptoms prior to surgery to contact our office to arrange for a follow-up visit, and she verbalized understanding.  No request to hold cardiac medications.  A copy of this note will be routed to requesting surgeon.  Time:   Today, I have spent 10 minutes with the patient with telehealth technology discussing medical history, symptoms, and management plan.     Rosaline EMERSON Bane, NP-C  10/04/2023, 9:02 AM [address], [address] [place], [place] [address] Office [phone] Fax [phone]

## 2023-10-25 ENCOUNTER — Other Ambulatory Visit: Payer: Self-pay

## 2023-10-25 ENCOUNTER — Other Ambulatory Visit

## 2023-10-25 DIAGNOSIS — E0580 Other thyrotoxicosis without thyrotoxic crisis or storm: Secondary | ICD-10-CM

## 2023-10-25 DIAGNOSIS — E0500 Thyrotoxicosis with diffuse goiter without thyrotoxic crisis or storm: Secondary | ICD-10-CM

## 2023-10-25 LAB — T4, FREE: Free T4: 0.9 ng/dL (ref 0.8–1.8)

## 2023-10-25 LAB — T3, FREE: T3, Free: 3 pg/mL (ref 2.3–4.2)

## 2023-10-25 LAB — TSH: TSH: 0.44 m[IU]/L (ref 0.40–4.50)

## 2023-10-26 ENCOUNTER — Ambulatory Visit: Payer: Self-pay | Admitting: Internal Medicine

## 2023-10-27 ENCOUNTER — Other Ambulatory Visit: Payer: Self-pay

## 2023-10-30 ENCOUNTER — Telehealth: Payer: Self-pay | Admitting: *Deleted

## 2023-10-30 ENCOUNTER — Telehealth: Payer: Self-pay

## 2023-10-30 ENCOUNTER — Other Ambulatory Visit (HOSPITAL_COMMUNITY): Payer: Self-pay

## 2023-10-30 ENCOUNTER — Other Ambulatory Visit: Payer: Self-pay

## 2023-10-30 NOTE — Telephone Encounter (Signed)
Benefit verification started

## 2023-10-30 NOTE — Telephone Encounter (Signed)
 Pt ready for scheduling for PROLIA  on or after : 11/17/23  Option# 1: Buy/Bill (Office supplied medication)  Out-of-pocket cost due at time of clinic visit: $332  Number of injection/visits approved: ---  Primary: [employer] Prolia  co-insurance: 20% Admin fee co-insurance: 0%  Secondary: --- Prolia  co-insurance:  Admin fee co-insurance:   Medical Benefit Details: Date Benefits were checked: 10/30/23 Deductible: NO/ Coinsurance: 20%/ Admin Fee: 0%  Prior Auth: N/A PA# Expiration Date:   # of doses approved: ----------------------------------------------------------------------- Option# 2- Med Obtained from pharmacy:  Pharmacy benefit: Copay $250 (Paid to pharmacy) Admin Fee: 0% (Pay at clinic)  Prior Auth: N/A PA# Expiration Date:   # of doses approved:   If patient wants fill through the pharmacy benefit please send prescription to: [facility], and include estimated need by date in rx notes. Pharmacy will ship medication directly to the office.  Patient NOT eligible for Prolia  Copay Card. Copay Card can make patient's cost as little as $25. Link to apply: https://www.amgensupportplus.[email or web address]/copay  ** This summary of benefits is an estimation of the patient's out-of-pocket cost. Exact cost may very based on individual plan coverage.

## 2023-10-30 NOTE — Telephone Encounter (Signed)
 Please run auth for Prolia .  Not sure if this is in your workque.

## 2023-10-30 NOTE — Telephone Encounter (Addendum)
 Left message on machine for pt to call back to scheduled and if she would like for us  to do same as last time which is to order from pharmacy?  She is due on or around 11/20/23.

## 2023-10-30 NOTE — Telephone Encounter (Signed)
 Prolia  VOB initiated via [email or web address]  Next Prolia  inj DUE: 11/17/23

## 2023-10-30 NOTE — Telephone Encounter (Unsigned)
 Copied from CRM [id number]. Topic: Appointments - Scheduling Inquiry for Clinic >> Oct 30, 2023 12:33 PM Rebecca [name] wrote: Reason for CRM: Patient called on stating that somebody called her for her prolia  injection and she missed the call. Patient requesting call back and can be reached at [phone].

## 2023-10-31 NOTE — Telephone Encounter (Signed)
 Pt will get at ov on 12/29/23

## 2023-11-06 ENCOUNTER — Telehealth: Payer: Self-pay | Admitting: Internal Medicine

## 2023-11-06 MED ORDER — CLONAZEPAM 0.5 MG PO TABS: 0.5000 mg | ORAL_TABLET | Freq: Three times a day (TID) | ORAL | 0 refills | Status: DC | PRN

## 2023-11-06 NOTE — Telephone Encounter (Signed)
 Requesting: clonazepam  0.5mg  Contract: 03/23/22 UDS: 09/21/22 Last Visit: 06/28/23 Next Visit: 12/29/23 Last Refill: 05/09/23 #270 and 0RF   Please Advise

## 2023-11-06 NOTE — Telephone Encounter (Signed)
 PDMP okay, Rx sent

## 2023-11-06 NOTE — Telephone Encounter (Signed)
 Copied from CRM #[id number]. Topic: Clinical - Medication Refill >> Nov 06, 2023  8:36 AM Thersia C wrote: Medication: clonazePAM  (KLONOPIN ) 0.5 MG tablet  Has the patient contacted their pharmacy? Yes (Agent: If no, request that the patient contact the pharmacy for the refill. If patient does not wish to contact the pharmacy document the reason why and proceed with request.) (Agent: If yes, when and what did the pharmacy advise?)  This is the patient's preferred pharmacy:  [facility] [id number] [place], [place] - [address] [place] [address] Phone: [phone] Fax: [phone]  Is this the correct pharmacy for this prescription? Yes If no, delete pharmacy and type the correct one.   Has the prescription been filled recently? No  Is the patient out of the medication? Yes  Has the patient been seen for an appointment in the last year OR does the patient have an upcoming appointment? Yes  Can we respond through MyChart? Yes  Agent: Please be advised that Rx refills may take up to 3 business days. We ask that you follow-up with your pharmacy.

## 2023-11-14 NOTE — Progress Notes (Signed)
 COVID Vaccine received:  []  No [x]  Yes Date of any COVID positive Test in last 90 days: no PCP - Aloysius Mech MD Cardiologist - Dr. [name]  Chest x-ray - 04/12/23 Epic EKG -   Stress Test -  ECHO -  Cardiac Cath -   Cardiac clearance-10/04/23- Rosaline Bane NP-C  Bowel Prep - [x]  No  []   Yes ______  Pacemaker / ICD device [x]  No []  Yes   Spinal Cord Stimulator:[x]  No []  Yes       History of Sleep Apnea? [x]  No []  Yes   CPAP used?- [x]  No []  Yes    Does the patient monitor blood sugar?          [x]  No []  Yes  []  N/A  Patient has: [x]  NO Hx DM   []  Pre-DM                 []  DM1  []   DM2 Does patient have a [employer] or Dexacom? [x]  No []  Yes   Fasting Blood Sugar Ranges-  Checks Blood Sugar _____ times a day  GLP1 agonist / usual dose - no GLP1 instructions:  SGLT-2 inhibitors / usual dose - no SGLT-2 instructions:   Blood Thinner / Instructions:no Aspirin  Instructions:no  Comments:   Activity level: Patient is able  to climb a flight of stairs without difficulty; [x]  No CP  [x]  No SOB,    Patient can perform ADLs without assistance.   Anesthesia review: Abnl EKG  Patient denies shortness of breath, fever, cough and chest pain at PAT appointment.  Patient verbalized understanding and agreement to the Pre-Surgical Instructions that were given to them at this PAT appointment. Patient was also educated of the need to review these PAT instructions again prior to his/her surgery.I reviewed the appropriate phone numbers to call if they have any and questions or concerns.

## 2023-11-14 NOTE — Patient Instructions (Signed)
 SURGICAL WAITING ROOM VISITATION  Patients having surgery or a procedure may have no more than 2 support people in the waiting area - these visitors may rotate.    Children under the age of 57 must have an adult with them who is not the patient.  Visitors with respiratory illnesses are discouraged from visiting and should remain at home.  If the patient needs to stay at the hospital during part of their recovery, the visitor guidelines for inpatient rooms apply. Pre-op nurse will coordinate an appropriate time for 1 support person to accompany patient in pre-op.  This support person may not rotate.    Please refer to the [facility] website for the visitor guidelines for Inpatients (after your surgery is over and you are in a regular room).       Your procedure is scheduled on: 11/21/23   Report to [facility] Main Entrance    Report to admitting at 5:15 AM   Call this number if you have problems the morning of surgery [phone]   Do not eat food :After Midnight.   After Midnight you may have the following liquids until 4:30 DAY OF SURGERY  Water  Non-Citrus Juices (without pulp, NO RED-Apple, White grape, White cranberry) Black Coffee (NO MILK/CREAM OR CREAMERS, sugar ok)  Clear Tea (NO MILK/CREAM OR CREAMERS, sugar ok) regular and decaf                             Plain Jell-O (NO RED)                                           Fruit ices (not with fruit pulp, NO RED)                                     Popsicles (NO RED)                                                               Sports drinks like Gatorade (NO RED)                The day of surgery:  Drink ONE (1) Pre-Surgery Clear Ensure at 4:30 AM the morning of surgery. Drink in one sitting. Do not sip.  This drink was given to you during your hospital  pre-op appointment visit. Nothing else to drink after completing the  Pre-Surgery Clear Ensure.    Oral Hygiene is also important to reduce your risk of  infection.                                    Remember - BRUSH YOUR TEETH THE MORNING OF SURGERY WITH YOUR REGULAR TOOTHPASTE  DENTURES WILL BE REMOVED PRIOR TO SURGERY PLEASE DO NOT APPLY Poly grip OR ADHESIVES!!!   Stop all vitamins and herbal supplements 7 days before surgery.   Take these medicines the morning of surgery with A SIP OF WATER : tylenol  if needed, Klonopin , lexapro (escitaprolam), Loratadine(Claritin), Tapazole (methimazole ), Simvastatin (zocor )  You may not have any metal on your body including hair pins, jewelry, and body piercing             Do not wear make-up, lotions, powders, perfumes/cologne, or deodorant  Do not wear nail polish including gel and S&S, artificial/acrylic nails, or any other type of covering on natural nails including finger and toenails. If you have artificial nails, gel coating, etc. that needs to be removed by a nail salon please have this removed prior to surgery or surgery may need to be canceled/ delayed if the surgeon/ anesthesia feels like they are unable to be safely monitored.   Do not shave  48 hours prior to surgery.    Do not bring valuables to the hospital. [facility] IS NOT             RESPONSIBLE   FOR VALUABLES.   Contacts, glasses, dentures or bridgework may not be worn into surgery.  DO NOT BRING YOUR HOME MEDICATIONS TO THE HOSPITAL. PHARMACY WILL DISPENSE MEDICATIONS LISTED ON YOUR MEDICATION LIST TO YOU DURING YOUR ADMISSION IN THE HOSPITAL!    Patients discharged on the day of surgery will not be allowed to drive home.  Someone NEEDS to stay with you for the first 24 hours after anesthesia.   Special Instructions: Bring a copy of your healthcare power of attorney and living will documents the day of surgery if you haven't scanned them before.              Please read over the following fact sheets you were given: IF YOU HAVE QUESTIONS ABOUT YOUR PRE-OP INSTRUCTIONS PLEASE CALL [phone] Rebecca [name]   If you received  a COVID test during your pre-op visit  it is requested that you wear a mask when out in public, stay away from anyone that may not be feeling well and notify your surgeon if you develop symptoms. If you test positive for Covid or have been in contact with anyone that has tested positive in the last 10 days please notify you surgeon.      Pre-operative 5 CHG Bath Instructions   You can play a key role in reducing the risk of infection after surgery. Your skin needs to be as free of germs as possible. You can reduce the number of germs on your skin by washing with CHG (chlorhexidine  gluconate) soap before surgery. CHG is an antiseptic soap that kills germs and continues to kill germs even after washing.   DO NOT use if you have an allergy to chlorhexidine /CHG or antibacterial soaps. If your skin becomes reddened or irritated, stop using the CHG and notify one of our RNs at [phone].   Please shower with the CHG soap starting 4 days before surgery using the following schedule:     Please keep in mind the following:  DO NOT shave, including legs and underarms, starting the day of your first shower.   You may shave your face at any point before/day of surgery.  Place clean sheets on your bed the day you start using CHG soap. Use a clean washcloth (not used since being washed) for each shower. DO NOT sleep with pets once you start using the CHG.   CHG Shower Instructions:  If you choose to wash your hair and private area, wash first with your normal shampoo/soap.  After you use shampoo/soap, rinse your hair and body thoroughly to remove shampoo/soap residue.  Turn the water  OFF and apply about 3 tablespoons (45 ml) of  CHG soap to a CLEAN washcloth.  Apply CHG soap ONLY FROM YOUR NECK DOWN TO YOUR TOES (washing for 3-5 minutes)  DO NOT use CHG soap on face, private areas, open wounds, or sores.  Pay special attention to the area where your surgery is being performed.  If you are having back  surgery, having someone wash your back for you may be helpful. Wait 2 minutes after CHG soap is applied, then you may rinse off the CHG soap.  Pat dry with a clean towel  Put on clean clothes/pajamas   If you choose to wear lotion, please use ONLY the CHG-compatible lotions on the back of this paper.     Additional instructions for the day of surgery: DO NOT APPLY any lotions, deodorants, cologne, or perfumes.   Put on clean/comfortable clothes.  Brush your teeth.  Ask your nurse before applying any prescription medications to the skin.      CHG Compatible Lotions   Aveeno Moisturizing lotion  Cetaphil Moisturizing Cream  Cetaphil Moisturizing Lotion  Clairol Herbal Essence Moisturizing Lotion, Dry Skin  Clairol Herbal Essence Moisturizing Lotion, Extra Dry Skin  Clairol Herbal Essence Moisturizing Lotion, Normal Skin  Curel Age Defying Therapeutic Moisturizing Lotion with Alpha Hydroxy  Curel Extreme Care Body Lotion  Curel Soothing Hands Moisturizing Hand Lotion  Curel Therapeutic Moisturizing Cream, Fragrance-Free  Curel Therapeutic Moisturizing Lotion, Fragrance-Free  Curel Therapeutic Moisturizing Lotion, Original Formula  Eucerin Daily Replenishing Lotion  Eucerin Dry Skin Therapy Plus Alpha Hydroxy Crme  Eucerin Dry Skin Therapy Plus Alpha Hydroxy Lotion  Eucerin Original Crme  Eucerin Original Lotion  Eucerin Plus Crme Eucerin Plus Lotion  Eucerin TriLipid Replenishing Lotion  Keri Anti-Bacterial Hand Lotion  Keri Deep Conditioning Original Lotion Dry Skin Formula Softly Scented  Keri Deep Conditioning Original Lotion, Fragrance Free Sensitive Skin Formula  Keri Lotion Fast Absorbing Fragrance Free Sensitive Skin Formula  Keri Lotion Fast Absorbing Softly Scented Dry Skin Formula  Keri Original Lotion  Keri Skin Renewal Lotion Keri Silky Smooth Lotion  Keri Silky Smooth Sensitive Skin Lotion  Nivea Body Creamy Conditioning Oil  Nivea Body Extra Enriched  Lotion  Nivea Body Original Lotion  Nivea Body Sheer Moisturizing Lotion Nivea Crme  Nivea Skin Firming Lotion  NutraDerm 30 Skin Lotion  NutraDerm Skin Lotion  NutraDerm Therapeutic Skin Cream  NutraDerm Therapeutic Skin Lotion  ProShield Protective Hand Cream   Incentive Spirometer (Watch this video at home: [email or web address])  An incentive spirometer is a tool that can help keep your lungs clear and active. This tool measures how well you are filling your lungs with each breath. Taking long deep breaths may help reverse or decrease the chance of developing breathing (pulmonary) problems (especially infection) following: A long period of time when you are unable to move or be active. BEFORE THE PROCEDURE  If the spirometer includes an indicator to show your best effort, your nurse or respiratory therapist will set it to a desired goal. If possible, sit up straight or lean slightly forward. Try not to slouch. Hold the incentive spirometer in an upright position. INSTRUCTIONS FOR USE  Sit on the edge of your bed if possible, or sit up as far as you can in bed or on a chair. Hold the incentive spirometer in an upright position. Breathe out normally. Place the mouthpiece in your mouth and seal your lips tightly around it. Breathe in slowly and as deeply as possible, raising the piston or the ball toward the top of the  column. Hold your breath for 3-5 seconds or for as long as possible. Allow the piston or ball to fall to the bottom of the column. Remove the mouthpiece from your mouth and breathe out normally. Rest for a few seconds and repeat Steps 1 through 7 at least 10 times every 1-2 hours when you are awake. Take your time and take a few normal breaths between deep breaths. The spirometer may include an indicator to show your best effort. Use the indicator as a goal to work toward during each repetition. After each set of 10 deep breaths, practice coughing  to be sure your lungs are clear. If you have an incision (the cut made at the time of surgery), support your incision when coughing by placing a pillow or rolled up towels firmly against it. Once you are able to get out of bed, walk around indoors and cough well. You may stop using the incentive spirometer when instructed by your caregiver.  RISKS AND COMPLICATIONS Take your time so you do not get dizzy or light-headed. If you are in pain, you may need to take or ask for pain medication before doing incentive spirometry. It is harder to take a deep breath if you are having pain. AFTER USE Rest and breathe slowly and easily. It can be helpful to keep track of a log of your progress. Your caregiver can provide you with a simple table to help with this. If you are using the spirometer at home, follow these instructions: SEEK MEDICAL CARE IF:  You are having difficultly using the spirometer. You have trouble using the spirometer as often as instructed. Your pain medication is not giving enough relief while using the spirometer. You develop fever of 100.5 F (38.1 C) or higher. SEEK IMMEDIATE MEDICAL CARE IF:  You cough up bloody sputum that had not been present before. You develop fever of 102 F (38.9 C) or greater. You develop worsening pain at or near the incision site. MAKE SURE YOU:  Understand these instructions. Will watch your condition. Will get help right away if you are not doing well or get worse.   [facility]- Preparing for Total Shoulder Arthroplasty    Before surgery, you can play an important role. Because skin is not sterile, your skin needs to be as free of germs as possible. You can reduce the number of germs on your skin by using the following products. Benzoyl Peroxide Gel Reduces the number of germs present on the skin Applied twice a day to shoulder area starting two days before surgery     ==================================================================  Please follow these instructions carefully:  BENZOYL PEROXIDE 5% GEL  Please do not use if you have an allergy to benzoyl peroxide.   If your skin becomes reddened/irritated stop using the benzoyl peroxide.  Starting two days before surgery, apply as follows: Apply benzoyl peroxide in the morning and at night. Apply after taking a shower. If you are not taking a shower clean entire shoulder front, back, and side along with the armpit with a clean wet washcloth.  Place a quarter-sized dollop on your shoulder and rub in thoroughly, making sure to cover the front, back, and side of your shoulder, along with the armpit.   2 days before ____ AM   ____ PM              1 day before ____ AM   ____ PM  Do this twice a day for two days.  (Last application is the night before surgery, AFTER using the CHG soap as described below).  Do NOT apply benzoyl peroxide gel on the day of surgery.

## 2023-11-14 NOTE — H&P (Signed)
 SHOULDER ARTHROPLASTY ADMISSION H&P  Patient ID: Rebecca [name] MRN: [id number] DOB/AGE: 08/03/1940 83 y.o.  Chief Complaint: right shoulder pain.  Planned Procedure Date: 11/21/23 Medical Clearance by Rebecca. Amon   Cardiac Clearance by Rebecca Booty, NP   HPI: Rebecca [name] is a 83 y.o. female who presents for evaluation of Primary osteoarthritis of right shoulder. The patient has a history of pain and functional disability in the right shoulder due to arthritis and has failed non-surgical conservative treatments for greater than 12 weeks to include NSAID's and/or analgesics and activity modification.  Onset of symptoms was abrupt, starting 1 years ago with rapidlly worsening course since that time. The patient noted previous right shoulder rotator cuff repair on the right shoulder.  Patient currently rates pain to be moderate with activity. Patient has worsening of pain with activity and weight bearing and pain that interferes with activities of daily living.  Patient has evidence of joint space narrowing by imaging studies.  There is no active infection.  Past Medical History:  Diagnosis Date   Anxiety    Arthritis    degenerative lumbar spine    Chronic neck pain    Closed fracture of base of fifth metatarsal bone 01/2018   Left   Complication of anesthesia    at one time my blood pressure dropped -lumbar - on bp med at that time   Depression    Dysphagia    esophageal stretched 9-14    Fibromyalgia    Graves disease    History of blood transfusion 2009   post - lumbar fusion    Hyperlipidemia    Hypertension    No longer takes med.   Osteoarthritis of left shoulder region    Mild, Rebecca. Josefina   Osteoporosis    Past Surgical History:  Procedure Laterality Date   ABDOMINAL HYSTERECTOMY     ANTERIOR CERVICAL DECOMP/DISCECTOMY FUSION N/A 11/20/2014   Procedure: ANTERIOR CERVICAL DECOMPRESSION/DISCECTOMY FUSION 2 LEVELS;  Surgeon: Rebecca Priestly, MD;  Location: [facility] OR;   Service: Orthopedics;  Laterality: N/A;  Anterior cervical decompression fusion, cervical 3-4, cervical 4-5 with instrumentation and allograft   arm surgery Bilateral    transposed ulner nerve 10 (Rebecca. Camella)   BACK SURGERY  2009   Rebecca Rebecca [name] SANES SURGERY  01/2014   Rebecca Rebecca [name]   carpal tunnell  2006   bilateral   CHOLECYSTECTOMY     KNEE SURGERY  11/2009   right,  scope; left knee   NECK SURGERY  05/2008   Rebecca Rebecca [name]   OOPHORECTOMY Bilateral    PARTIAL KNEE ARTHROPLASTY  01/17/2012   LEFT KNEE   PARTIAL KNEE ARTHROPLASTY  01/17/2012   Procedure: UNICOMPARTMENTAL KNEE;  Surgeon: Rebecca Rebecca Josefina, MD;  Location: [facility] OR;  Service: Orthopedics;  Laterality: Left;   PARTIAL KNEE ARTHROPLASTY Right 04/21/2020   Procedure: UNICOMPARTMENTAL KNEE;  Surgeon: Rebecca [name] Fonda, MD;  Location: [facility] ORS;  Service: Orthopedics;  Laterality: Right;   REVERSE SHOULDER ARTHROPLASTY Left 08/08/2023   Procedure: ARTHROPLASTY, SHOULDER, TOTAL, REVERSE;  Surgeon: Rebecca [name] Fonda, MD;  Location: [facility] ORS;  Service: Orthopedics;  Laterality: Left;   RHINOPLASTY  1980   SHOULDER SURGERY Bilateral    x 2/Rebecca. Carlos   thumb surgery Right 07/2022   Rebecca [name]   TONSILLECTOMY     Allergies  Allergen Reactions   Tape Itching and Other (See Comments)    White adhesive tape -blisters   Alendronate Sodium Other (See Comments)    jaw  pain   Oxycodone -Acetaminophen  Hives   Cortisone     Pt received cortisone shot, and became very flushed, and hot, blisters, raw areas around lips & ears    Penicillins Swelling and Rash   Prior to Admission medications   Medication Sig Start Date End Date Taking? Authorizing Provider  acetaminophen  (TYLENOL ) 500 MG tablet Take 1,000 mg by mouth in the morning and at bedtime.   Yes [provider]  clonazePAM  (KLONOPIN ) 0.5 MG tablet Take 1 tablet (0.5 mg total) by mouth 3 (three) times daily as needed for anxiety. 11/06/23  Yes Rebecca [name], Rebecca BRAVO, MD  denosumab  (PROLIA ) 60 MG/ML SOSY  injection Inject 60 mg into the skin every 6 (six) months. Dx code: M81.0.  pt has appt on 05/17/23 05/03/23  Yes Rebecca [name], Rebecca BRAVO, MD  escitalopram  (LEXAPRO ) 10 MG tablet Take 1 tablet (10 mg total) by mouth daily. 05/24/23  Yes Rebecca [name], Rebecca E, MD  loratadine (CLARITIN) 10 MG tablet Take 10 mg by mouth daily.   Yes [provider]  melatonin 5 MG TABS Take 5 mg by mouth at bedtime as needed (sleep).   Yes [provider]  methimazole  (TAPAZOLE ) 5 MG tablet Take 1 tablet by mouth in the morning and half a tablet in the afternoon. Patient taking differently: Take 5 mg by mouth daily. 07/19/23  Yes Rebecca File, MD  Multiple Vitamin (MULTIVITAMIN) tablet Take 1 tablet by mouth daily.   Yes [provider]  mupirocin  ointment (BACTROBAN ) 2 % 1 Application 2 (two) times daily.   Yes [provider]  Propylene Glycol (SYSTANE BALANCE) 0.6 % SOLN Place 1 drop into both eyes daily.   Yes [provider]  simvastatin  (ZOCOR ) 40 MG tablet Take 1 tablet (40 mg total) by mouth at bedtime. 05/24/23  Yes Rebecca [name], Rebecca E, MD  vitamin B-12 (CYANOCOBALAMIN ) 100 MCG tablet Take 100 mcg by mouth daily.   Yes [provider]  vitamin C (ASCORBIC ACID) 500 MG tablet Take 500 mg by mouth daily.   Yes [provider]   Social History   Socioeconomic History   Marital status: Widowed    Spouse name: Rebecca [name].   Number of children: 4   Years of education: Not on [name]   Highest education level: Not on [name]  Occupational History   Occupation: stay home   Tobacco Use   Smoking status: Never   Smokeless tobacco: Never  Vaping Use   Vaping status: Never Used  Substance and Sexual Activity   Alcohol use: Not Currently    Comment: rare   Drug use: No   Sexual activity: Not Currently  Other Topics Concern   Not on [name]  Social History Narrative   Lost husband 2021   Household: pt and son Signe   Children: 3 daughters and 1 son   Social Drivers of Manufacturing engineer Strain: Low Risk  (10/13/2022)   Overall Financial Resource Strain (CARDIA)    Difficulty of Paying Living Expenses: Not hard at all  Food Insecurity: No Food Insecurity (10/13/2022)   Hunger Vital Sign    Worried About Running Out of Food in the Last Year: Never true    Ran Out of Food in the Last Year: Never true  Transportation Needs: No Transportation Needs (10/13/2022)   PRAPARE - Administrator, Civil Service (Medical): No    Lack of Transportation (Non-Medical): No  Physical Activity: Inactive (10/13/2022)   Exercise Vital Sign  Days of Exercise per Week: 0 days    Minutes of Exercise per Session: 0 min  Stress: No Stress Concern Present (10/13/2022)   [employer] of Occupational Health - Occupational Stress Questionnaire    Feeling of Stress : Not at all  Social Connections: Socially Integrated (10/13/2022)   Social Connection and Isolation Panel    Frequency of Communication with Friends and Family: More than three times a week    Frequency of Social Gatherings with Friends and Family: More than three times a week    Attends Religious Services: More than 4 times per year    Active Member of [employer] or Organizations: Yes    Attends Engineer, structural: More than 4 times per year    Marital Status: Married   Family History  Problem Relation Age of Onset   Prostate cancer Father    Diabetes Sister    Heart disease Sister        sister and brothers x 2   Kidney disease Sister    Kidney disease Brother    Heart disease Brother    Stroke Daughter    Stomach cancer Maternal Aunt    Colon cancer Neg Hx    Breast cancer Neg Hx    Esophageal cancer Neg Hx    Pancreatic cancer Neg Hx     ROS: Currently denies lightheadedness, dizziness, Fever, chills, CP, SOB.   No personal history of DVT, PE, MI, or CVA. No loose teeth. + dentures All other systems have been reviewed and were otherwise currently negative with the exception of those  mentioned in the HPI and as above.  BMI: Estimated body mass index is 24.8 kg/m as calculated from the following:   Height as of 08/08/23: 5' (1.524 m).   Weight as of 08/08/23: 57.6 kg.  Lab Results  Component Value Date   ALBUMIN  4.1 07/27/2023   Diabetes: Patient does not have a diagnosis of diabetes.     Smoking Status:   reports that she has never smoked. She has never used smokeless tobacco.     Objective: Vitals: Ht: 4' 11 Wt: 137 lbs Temp: 98.1 BP: 148/78 Pulse: 73 O2 98% on room air.   Physical Exam: General: Alert, NAD.   HEENT: EOMI, Good Neck Extension  Pulm: No increased work of breathing.  Clear B/L A/P w/o crackle or wheeze.  CV: RRR, No m/g/r appreciated  GI: soft, NT, ND Neuro: Neuro without gross focal deficit.  Sensation intact distally Skin: No lesions in the area of chief complaint MSK/Surgical Site: right shoulder pain with range of motion.  Forward flexion/abduction approximately 0-70.  Internal rotation to right buttock.  External rotation to neutral.  Weak Rotator cuff strength.  NVI distally.  Imaging Review CT scan demonstrate severe degenerative joint disease   Assessment: Right shoulder rotator cuff arthropathy   Plan: Plan for Procedure(s): ARTHROPLASTY, SHOULDER, TOTAL, REVERSE  The patient history, physical exam, clinical judgement of the provider and imaging are consistent with end stage degenerative joint disease and reverse total joint arthroplasty is deemed medically necessary. The treatment options including medical management, injection therapy, and arthroplasty were discussed at length. The risks and benefits of Procedure(s): ARTHROPLASTY, SHOULDER, TOTAL, REVERSE were presented and reviewed.  The risks of nonoperative treatment, versus surgical intervention including but not limited to continued pain, aseptic loosening, stiffness, dislocation/subluxation, infection, bleeding, nerve injury, blood clots, cardiopulmonary complications,  morbidity, mortality, among others were discussed. The patient verbalizes understanding and wishes to proceed with  the plan.  Patient is being admitted for surgery, OT, pain control, prophylactic antibiotics, VTE prophylaxis, progressive ambulation, ADL's and discharge planning.   The patient does meet the criteria for TXA which will be used perioperatively.   The patient is planning to be discharged home care of son.   Peaches Vanoverbeke K Joevanni Roddey, PA-C 11/14/2023 9:43 AM

## 2023-11-14 NOTE — Care Plan (Signed)
 Ortho Bundle Case Management Note  Patient Details  Name: Rebecca [name] MRN: [id number] Date of Birth: 02-19-1941  met with patient and son in the office for H&P. will discharge to home with his assistance. no DME needed. will delay OPPT for 6 weeks. she has exercises at home and will do them from her other side. questions answered. Patient and MD in agreement with plan. Choice offered.                 DME Arranged:    DME Agency:     HH Arranged:    HH Agency:     Additional Comments: Please contact me with any questions of if this plan should need to change.  Charlies Pitch,  RN,BSN,MHA,CCM  [facility] Orthopaedic Specialist  [phone] 11/14/2023, 12:10 PM

## 2023-11-15 ENCOUNTER — Encounter (HOSPITAL_COMMUNITY): Payer: Self-pay

## 2023-11-15 ENCOUNTER — Encounter (HOSPITAL_COMMUNITY)
Admission: RE | Admit: 2023-11-15 | Discharge: 2023-11-15 | Disposition: A | Discharge status: Home / Self Care | Source: Ambulatory Visit | Attending: Orthopedic Surgery | Admitting: Orthopedic Surgery

## 2023-11-15 ENCOUNTER — Other Ambulatory Visit: Payer: Self-pay

## 2023-11-15 VITALS — BP 163/83 | HR 57 | Temp 97.6°F | Resp 16 | Ht 60.0 in | Wt 137.0 lb

## 2023-11-15 DIAGNOSIS — R001 Bradycardia, unspecified: Secondary | ICD-10-CM | POA: Diagnosis not present

## 2023-11-15 DIAGNOSIS — Z96612 Presence of left artificial shoulder joint: Secondary | ICD-10-CM | POA: Insufficient documentation

## 2023-11-15 DIAGNOSIS — E0500 Thyrotoxicosis with diffuse goiter without thyrotoxic crisis or storm: Secondary | ICD-10-CM | POA: Insufficient documentation

## 2023-11-15 DIAGNOSIS — Z01818 Encounter for other preprocedural examination: Secondary | ICD-10-CM | POA: Insufficient documentation

## 2023-11-15 DIAGNOSIS — I10 Essential (primary) hypertension: Secondary | ICD-10-CM | POA: Diagnosis not present

## 2023-11-15 DIAGNOSIS — M797 Fibromyalgia: Secondary | ICD-10-CM | POA: Insufficient documentation

## 2023-11-15 DIAGNOSIS — M19011 Primary osteoarthritis, right shoulder: Secondary | ICD-10-CM | POA: Diagnosis not present

## 2023-11-15 HISTORY — DX: Thyrotoxicosis, unspecified without thyrotoxic crisis or storm: E05.90

## 2023-11-15 LAB — CBC
HCT: 36.1 % (ref 36.0–46.0)
Hemoglobin: 11.6 g/dL — ABNORMAL LOW (ref 12.0–15.0)
MCH: 31.7 pg (ref 26.0–34.0)
MCHC: 32.1 g/dL (ref 30.0–36.0)
MCV: 98.6 fL (ref 80.0–100.0)
Platelets: 109 K/uL — ABNORMAL LOW (ref 150–400)
RBC: 3.66 MIL/uL — ABNORMAL LOW (ref 3.87–5.11)
RDW: 13.1 % (ref 11.5–15.5)
WBC: 4.5 K/uL (ref 4.0–10.5)
nRBC: 0 % (ref 0.0–0.2)

## 2023-11-15 LAB — BASIC METABOLIC PANEL WITH GFR
Anion gap: 10 (ref 5–15)
BUN: 22 mg/dL (ref 8–23)
CO2: 22 mmol/L (ref 22–32)
Calcium: 9.1 mg/dL (ref 8.9–10.3)
Chloride: 107 mmol/L (ref 98–111)
Creatinine, Ser: 0.98 mg/dL (ref 0.44–1.00)
GFR, Estimated: 57 mL/min — ABNORMAL LOW (ref >60)
Glucose, Bld: 88 mg/dL (ref 70–99)
Potassium: 3.9 mmol/L (ref 3.5–5.1)
Sodium: 139 mmol/L (ref 135–145)

## 2023-11-15 LAB — SURGICAL PCR SCREEN
MRSA, PCR: NEGATIVE
Staphylococcus aureus: NEGATIVE

## 2023-11-16 NOTE — Progress Notes (Signed)
 Anesthesia Chart Review   Case: [id number] Date/Time: 11/21/23 0715   Procedure: ARTHROPLASTY, SHOULDER, TOTAL, REVERSE (Right: Shoulder)   Anesthesia type: General   Diagnosis: Primary osteoarthritis of right shoulder [M19.011]   Pre-op diagnosis: Primary osteoarthritis of right shoulder   Location: [facility] ROOM 08 / [facility]   Surgeons: Josefina Chew, MD       DISCUSSION:83 y.o. never smoker with h/o HTN, Graves disease, fibromyalgia, right shoulder OA scheduled for above procedure 11/21/2023 with Dr. Chew Josefina.   Per cardiology preoperative evaluation 10/04/23, Preoperative Cardiovascular Risk Assessment: According to the Revised Cardiac Risk Index (RCRI), her Perioperative Risk of Major Cardiac Event is (%): 0.9. Her Functional Capacity in METs is: 6.61 according to the Duke Activity Status Index (DASI). The patient is doing well from a cardiac perspective. Therefore, based on ACC/AHA guidelines, the patient would be at acceptable risk for the planned procedure without further cardiovascular testing.   S/p left shoulder replacement 08/08/23 with no anesthesia complications noted.  VS: BP (!) 163/83   Pulse (!) 57   Temp 36.4 C (Oral)   Resp 16   Ht 5' (1.524 m)   Wt 62.1 kg   SpO2 100%   BMI 26.76 kg/m   PROVIDERS: Amon Aloysius BRAVO, MD is PCP   Cardiologist - Dr. [name]  LABS: Labs reviewed: Acceptable for surgery. (all labs ordered are listed, but only abnormal results are displayed)  Labs Reviewed  BASIC METABOLIC PANEL WITH GFR - Abnormal; Notable for the following components:      Result Value   GFR, Estimated 57 (*)    All other components within normal limits  CBC - Abnormal; Notable for the following components:   RBC 3.66 (*)    Hemoglobin 11.6 (*)    Platelets 109 (*)    All other components within normal limits  SURGICAL PCR SCREEN     IMAGES:   EKG:   CV:  Past Medical History:  Diagnosis Date   Anxiety    Arthritis    degenerative lumbar  spine    Chronic neck pain    Closed fracture of base of fifth metatarsal bone 01/2018   Left   Complication of anesthesia    at one time my blood pressure dropped -lumbar - on bp med at that time   Depression    Dysphagia    esophageal stretched 9-14    Fibromyalgia    Graves disease    History of blood transfusion 2009   post - lumbar fusion    Hyperlipidemia    Hypertension    No longer takes med.   Hyperthyroidism    Osteoarthritis of left shoulder region    Mild, Dr. Josefina   Osteoporosis     Past Surgical History:  Procedure Laterality Date   ABDOMINAL HYSTERECTOMY     ANTERIOR CERVICAL DECOMP/DISCECTOMY FUSION N/A 11/20/2014   Procedure: ANTERIOR CERVICAL DECOMPRESSION/DISCECTOMY FUSION 2 LEVELS;  Surgeon: Oneil Priestly, MD;  Location: [facility];  Service: Orthopedics;  Laterality: N/A;  Anterior cervical decompression fusion, cervical 3-4, cervical 4-5 with instrumentation and allograft   arm surgery Bilateral    transposed ulner nerve 10 (dr. Camella)   BACK SURGERY  2009   Dr Onetha SANES SURGERY  01/2014   Dr Onetha   carpal tunnell  2006   bilateral   CHOLECYSTECTOMY     KNEE SURGERY  11/2009   right,  scope; left knee   NECK SURGERY  05/2008   Dr  Cram   OOPHORECTOMY Bilateral    PARTIAL KNEE ARTHROPLASTY  01/17/2012   LEFT KNEE   PARTIAL KNEE ARTHROPLASTY  01/17/2012   Procedure: UNICOMPARTMENTAL KNEE;  Surgeon: Fonda SHAUNNA Olmsted, MD;  Location: [facility];  Service: Orthopedics;  Laterality: Left;   PARTIAL KNEE ARTHROPLASTY Right 04/21/2020   Procedure: UNICOMPARTMENTAL KNEE;  Surgeon: Olmsted Fonda, MD;  Location[facility];  Service: Orthopedics;  Laterality: Right;   REVERSE SHOULDER ARTHROPLASTY Left 08/08/2023   Procedure: ARTHROPLASTY, SHOULDER, TOTAL, REVERSE;  Surgeon: Olmsted Fonda, MD;  Location[facility];  Service: Orthopedics;  Laterality: Left;   RHINOPLASTY  1980   SHOULDER SURGERY Bilateral    x 2/Dr. Carlos   thumb surgery Right 07/2022   Dr Camella    TONSILLECTOMY      MEDICATIONS:  acetaminophen  (TYLENOL ) 500 MG tablet   clonazePAM  (KLONOPIN ) 0.5 MG tablet   denosumab  (PROLIA ) 60 MG/ML SOSY injection   escitalopram  (LEXAPRO ) 10 MG tablet   loratadine (CLARITIN) 10 MG tablet   melatonin 5 MG TABS   methimazole  (TAPAZOLE ) 5 MG tablet   Multiple Vitamin (MULTIVITAMIN) tablet   mupirocin  ointment (BACTROBAN ) 2 %   Propylene Glycol (SYSTANE BALANCE) 0.6 % SOLN   simvastatin  (ZOCOR ) 40 MG tablet   vitamin B-12 (CYANOCOBALAMIN ) 100 MCG tablet   vitamin C (ASCORBIC ACID) 500 MG tablet   No current facility-administered medications for this encounter.    Harlene Hoots Ward, PA-C [facility] [phone]

## 2023-11-17 ENCOUNTER — Other Ambulatory Visit: Payer: Self-pay | Admitting: Internal Medicine

## 2023-11-20 ENCOUNTER — Encounter: Payer: Self-pay | Admitting: Internal Medicine

## 2023-11-20 ENCOUNTER — Ambulatory Visit: Admitting: Internal Medicine

## 2023-11-20 VITALS — BP 124/80 | HR 64 | Ht 60.0 in | Wt 137.0 lb

## 2023-11-20 DIAGNOSIS — E0500 Thyrotoxicosis with diffuse goiter without thyrotoxic crisis or storm: Secondary | ICD-10-CM

## 2023-11-20 LAB — TSH: TSH: 0.42 m[IU]/L (ref 0.40–4.50)

## 2023-11-20 LAB — T4, FREE: Free T4: 1.1 ng/dL (ref 0.8–1.8)

## 2023-11-20 LAB — T3, FREE: T3, Free: 3.4 pg/mL (ref 2.3–4.2)

## 2023-11-20 NOTE — Progress Notes (Signed)
 Patient ID: Rebecca [name], female   DOB: Jun 13, 1940, 83 y.o.   MRN: [id number]  HPI  Rebecca [name] is a pleasant 83 y.o.-year-old female, initially referred by her PCP, Dr. Amon, for evaluation and management of Graves' disease.  Last visit 4 months ago.  Interim history: She had L shoulder sx first (08/08/2023), and is preparing for R shoulder surgery tomorrow. She is otherwise feeling very well, and has a lot of energy.  She was traveling to see her daughter in [place]  over the summer.  She plans to go back for another visit. No tremors, palpitations, heat intolerance.  Reviewed and ended history: In 03/2023, patient was found to have an undetectable TSH on labs checked for weight loss. She describes that she started to lose weight when her husband was sick with multiple myeloma in 12-13-2019.  He passed away 01/12/2020.  They were married 62 years.   Around that time, she lost 18 pounds in 6 months, developed tremors, significant fatigue and weakness to the point that she could not lift arms to comb her hair (but also had significant shoulder pain), pruritus over her body and especially head, and also diarrhea.  She also had anxiety but no palpitations or insomnia.  She described shortness of breath when lying down at night, but without associated palpitations or chest pain.  At our first visit in 04/2023 she described significant right shoulder pain for which surgery was scheduled at the end of the month.  This got postponed due to her thyroid  tests being abnormal.  In 04/2023 we started methimazole  5 mg twice a day.   In 07/2023, we decreased methimazole  to 5 mg in a.m. and 2.5 mg in p.m. In 08/2023, we decreased the methimazole  to 5 mg in a.m.  I reviewed pt's thyroid  tests: Lab Results  Component Value Date   TSH 0.44 10/25/2023   TSH 0.27 (L) 09/20/2023   TSH 5.25 (H) 08/25/2023   TSH 2.61 07/18/2023   TSH 0.01 (L) 05/18/2023   TSH 0.01 (L) 05/04/2023   TSH 0.01 (L) 04/19/2023    TSH <0.01 Repeated and verified X2. (L) 04/12/2023   TSH 2.86 02/10/2021   TSH 2.71 09/10/2020   FREET4 0.9 10/25/2023   FREET4 1.1 09/20/2023   FREET4 0.8 08/25/2023   FREET4 0.8 07/18/2023   FREET4 1.4 05/18/2023   FREET4 2.0 (H) 05/04/2023   FREET4 2.7 (H) 04/19/2023   FREET4 1.1 02/10/2021   T3FREE 3.0 10/25/2023   T3FREE 3.6 09/20/2023   T3FREE 3.0 08/25/2023   T3FREE 2.8 07/18/2023   T3FREE 4.7 (H) 05/18/2023   T3FREE 6.4 (H) 05/04/2023   T3FREE 7.7 (H) 04/19/2023   T3FREE 2.8 02/10/2021   Antithyroid antibodies: Lab Results  Component Value Date   TSI 407 (H) 04/19/2023   She also has a history of a thyroid  nodule per review of available records:  Carotid ultrasound (05/23/2008):  A tiny incidental low density nodule is detected in the right lobe  of the thyroid  gland.  This is well under 1 cm in diameter and  likely benign.   Pt denies: - feeling nodules in neck - dysphagia - choking  She has: - hoarseness - voice quivering  Pt does have a FH of thyroid  ds. - daughter thyroid  nodule - had thyroid  sx. No FH of thyroid  cancer. No h/o radiation tx to head or neck. No seaweed or kelp, no recent contrast studies. No herbal supplements. No Biotin use except for multivitamin.  She has  a history of shoulder pain and sees Beverley Millman.  She was getting steroid (Kenalog) injections, but not recently.  Pt. has a history of  transaminitis -blood tests normalized 07/2023: Lab Results  Component Value Date   ALT 24 07/27/2023   AST 34 07/27/2023   ALKPHOS 55 07/27/2023   BILITOT 0.8 07/27/2023   Previously: Component     Latest Ref Rng 04/12/2023 06/28/2023 07/11/2023  Total Bilirubin     0.2 - 1.2 mg/dL 0.5  0.7  0.6   Alkaline Phosphatase     39 - 117 U/L 59  61    AST     10 - 35 U/L 23  44 (H)  43 (H)   ALT     6 - 29 U/L 16  36 (H)  33 (H)   Total Protein     6.1 - 8.1 g/dL 6.4  6.5  6.6     She also has HL,  osteoporosis (on Prolia , first dose  11/17/2022), thrombocytopenia (with normal white blood cell count 04/12/2023).  She also has a history of back surgery and partial bilateral knee replacements.  ROS: + See HPI, + hypoacusis, + shortness of breath  Past Medical History:  Diagnosis Date   Anxiety    Arthritis    degenerative lumbar spine    Chronic neck pain    Closed fracture of base of fifth metatarsal bone 01/2018   Left   Complication of anesthesia    at one time my blood pressure dropped -lumbar - on bp med at that time   Depression    Dysphagia    esophageal stretched 9-14    Fibromyalgia    Graves disease    History of blood transfusion 2009   post - lumbar fusion    Hyperlipidemia    Hypertension    No longer takes med.   Hyperthyroidism    Osteoarthritis of left shoulder region    Mild, Dr. Josefina   Osteoporosis    Past Surgical History:  Procedure Laterality Date   ABDOMINAL HYSTERECTOMY     ANTERIOR CERVICAL DECOMP/DISCECTOMY FUSION N/A 11/20/2014   Procedure: ANTERIOR CERVICAL DECOMPRESSION/DISCECTOMY FUSION 2 LEVELS;  Surgeon: Oneil Priestly, MD;  [facility];  Service: Orthopedics;  Laterality: N/A;  Anterior cervical decompression fusion, cervical 3-4, cervical 4-5 with instrumentation and allograft   arm surgery Bilateral    transposed ulner nerve 10 (dr. Camella)   BACK SURGERY  2009   Dr Onetha SANES SURGERY  01/2014   Dr Onetha   carpal tunnell  2006   bilateral   CHOLECYSTECTOMY     KNEE SURGERY  11/2009   right,  scope; left knee   NECK SURGERY  05/2008   Dr Onetha   OOPHORECTOMY Bilateral    PARTIAL KNEE ARTHROPLASTY  01/17/2012   LEFT KNEE   PARTIAL KNEE ARTHROPLASTY  01/17/2012   Procedure: UNICOMPARTMENTAL KNEE;  Surgeon: Fonda SHAUNNA Josefina, MD;  Location: [facility] OR;  Service: Orthopedics;  Laterality: Left;   PARTIAL KNEE ARTHROPLASTY Right 04/21/2020   Procedure: UNICOMPARTMENTAL KNEE;  Surgeon: Josefina Fonda, MD;  [facility];  Service: Orthopedics;  Laterality: Right;    REVERSE SHOULDER ARTHROPLASTY Left 08/08/2023   Procedure: ARTHROPLASTY, SHOULDER, TOTAL, REVERSE;  Surgeon: Josefina Fonda, MD;  Location[facility];  Service: Orthopedics;  Laterality: Left;   RHINOPLASTY  1980   SHOULDER SURGERY Bilateral    x 2/Dr. Carlos   thumb surgery Right 07/2022   Dr Camella  TONSILLECTOMY     Social History   Socioeconomic History   Marital status: Widowed    Spouse name: Lynwood BROCKS.   Number of children: 4   Years of education: Not on file   Highest education level: Not on file  Occupational History   Occupation: stay home   Tobacco Use   Smoking status: Never   Smokeless tobacco: Never  Vaping Use   Vaping status: Never Used  Substance and Sexual Activity   Alcohol use: Yes    Comment: rare   Drug use: No   Sexual activity: Not Currently  Other Topics Concern   Not on file  Social History Narrative   Lost husband 2021   Household: pt and son Signe   Children: 3 daughters and 1 son   Social Drivers of Corporate investment banker Strain: Low Risk  (10/13/2022)   Overall Financial Resource Strain (CARDIA)    Difficulty of Paying Living Expenses: Not hard at all  Food Insecurity: No Food Insecurity (10/13/2022)   Hunger Vital Sign    Worried About Running Out of Food in the Last Year: Never true    Ran Out of Food in the Last Year: Never true  Transportation Needs: No Transportation Needs (10/13/2022)   PRAPARE - Administrator, Civil Service (Medical): No    Lack of Transportation (Non-Medical): No  Physical Activity: Inactive (10/13/2022)   Exercise Vital Sign    Days of Exercise per Week: 0 days    Minutes of Exercise per Session: 0 min  Stress: No Stress Concern Present (10/13/2022)   [employer] of Occupational Health - Occupational Stress Questionnaire    Feeling of Stress : Not at all  Social Connections: Socially Integrated (10/13/2022)   Social Connection and Isolation Panel    Frequency of Communication with Friends and  Family: More than three times a week    Frequency of Social Gatherings with Friends and Family: More than three times a week    Attends Religious Services: More than 4 times per year    Active Member of [employer] or Organizations: Yes    Attends Engineer, structural: More than 4 times per year    Marital Status: Married  Catering manager Violence: Not At Risk (10/13/2022)   Humiliation, Afraid, Rape, and Kick questionnaire    Fear of Current or Ex-Partner: No    Emotionally Abused: No    Physically Abused: No    Sexually Abused: No   Current Outpatient Medications on File Prior to Visit  Medication Sig Dispense Refill   acetaminophen  (TYLENOL ) 500 MG tablet Take 1,000 mg by mouth in the morning and at bedtime.     clonazePAM  (KLONOPIN ) 0.5 MG tablet Take 1 tablet (0.5 mg total) by mouth 3 (three) times daily as needed for anxiety. 270 tablet 0   denosumab  (PROLIA ) 60 MG/ML SOSY injection Inject 60 mg into the skin every 6 (six) months. Dx code: M81.0.  pt has appt on 05/17/23 1 mL 0   escitalopram  (LEXAPRO ) 10 MG tablet Take 1 tablet (10 mg total) by mouth daily. 90 tablet 1   loratadine (CLARITIN) 10 MG tablet Take 10 mg by mouth daily.     melatonin 5 MG TABS Take 5 mg by mouth at bedtime as needed (sleep).     methimazole  (TAPAZOLE ) 5 MG tablet Take 1 tablet by mouth in the morning and half a tablet in the afternoon. (Patient taking differently: Take 5 mg by mouth  daily.) 60 tablet 5   Multiple Vitamin (MULTIVITAMIN) tablet Take 1 tablet by mouth daily.     mupirocin  ointment (BACTROBAN ) 2 % 1 Application 2 (two) times daily.     Propylene Glycol (SYSTANE BALANCE) 0.6 % SOLN Place 1 drop into both eyes daily.     simvastatin  (ZOCOR ) 40 MG tablet Take 1 tablet (40 mg total) by mouth at bedtime. 90 tablet 1   vitamin B-12 (CYANOCOBALAMIN ) 100 MCG tablet Take 100 mcg by mouth daily.     vitamin C (ASCORBIC ACID) 500 MG tablet Take 500 mg by mouth daily.     No current  facility-administered medications on file prior to visit.   Allergies  Allergen Reactions   Tape Itching and Other (See Comments)    White adhesive tape -blisters   Alendronate Sodium Other (See Comments)    jaw pain   Oxycodone -Acetaminophen  Hives   Cortisone     Pt received cortisone shot, and became very flushed, and hot, blisters, raw areas around lips & ears    Penicillins Swelling and Rash   Family History  Problem Relation Age of Onset   Prostate cancer Father    Diabetes Sister    Heart disease Sister        sister and brothers x 2   Kidney disease Sister    Kidney disease Brother    Heart disease Brother    Stroke Daughter    Stomach cancer Maternal Aunt    Colon cancer Neg Hx    Breast cancer Neg Hx    Esophageal cancer Neg Hx    Pancreatic cancer Neg Hx    PE: BP 124/80   Pulse 64   Ht 5' (1.524 m)   Wt 137 lb (62.1 kg)   SpO2 97%   BMI 26.76 kg/m  Wt Readings from Last 10 Encounters:  11/20/23 137 lb (62.1 kg)  11/15/23 137 lb (62.1 kg)  08/08/23 127 lb (57.6 kg)  07/27/23 136 lb (61.7 kg)  07/18/23 135 lb 12.8 oz (61.6 kg)  06/28/23 136 lb (61.7 kg)  04/19/23 141 lb (64 kg)  04/12/23 141 lb 6 oz (64.1 kg)  12/28/22 161 lb 4 oz (73.1 kg)  12/13/22 159 lb 9.6 oz (72.4 kg)   Constitutional: normal weight, in NAD Eyes:  EOMI, no exophthalmos, + mild B stare ENT: no neck masses, no cervical lymphadenopathy Cardiovascular: RRR, No MRG Respiratory: CTA B Musculoskeletal: no deformities Skin:no rashes Neurological:  no tremor with outstretched hands  ASSESSMENT: 1. Thyrotoxicosis  2.  Thyroid  nodule  PLAN:  1. Patient with history of low TSH and thyrotoxic symptoms including weight loss (18 pounds in 6 months), increased fatigue, weakness, hyperdefecation, tremors. - At our first visit, we discussed about potential causes for her abnormal TFTs.  She was not on amiodarone, lithium, excessive doses of biotin, but had a steroid injection in the  shoulder 3 weeks prior to the abnormal TSH obtained before our visit.  She did have thyrotoxic signs or symptoms and indeed, subsequent TFTs confirm thyrotoxicosis.  Also, reviewing her chart, she had abnormal thyroid  tests detected after 01/2021, while all previous TSH levels going back to 2008 were normal.  Her TSI's were elevated, confirming Graves' disease. - We did discuss about potential treatment for Graves' disease including methimazole , RAI treatment, and surgery.  We ended up starting methimazole  at 5 mg twice a day but we were able to decrease the dose to 5 mg in a.m. and 2.5 mg in the  afternoon in 07/2023 and to 5 mg daily in 08/2023.  Her LFTs increased slightly initially but they normalized afterwards.  At last check, in 07/2023, they were all normal. -At our last visit, she felt better, without tremors, palpitations, and with resolved fatigue.  She did lose 6 pounds before last visit.  Since then, she gained 10 pounds, she feels that this was due to her trip to [place] .  She continues to to feel well at today's visit. -No active signs of Graves' ophthalmopathy: Double vision, blurry vision, eye pain, but she continues to have xerophthalmia and mild stare - She was able to have left shoulder surgery 08/08/2023.  She is preparing for right shoulder surgery 11/21/2023. - I will see her back in 6 months  2.  Thyroid  nodule - She had a subcentimeter thyroid  nodule per review of the carotid ultrasound result from 2010 - No neck compression symptoms or masses felt on palpation of her neck today - No further imaging tests are needed for now  Needs refills -  90 days.  Orders Placed This Encounter  Procedures   TSH   T4, free   T3, free   Lela Fendt, MD PhD [facility]

## 2023-11-20 NOTE — H&P (Signed)
 SHOULDER ARTHROPLASTY ADMISSION H&P  Patient ID: Rebecca [name] MRN: [id number] DOB/AGE: 10-03-1940 83 y.o.  Chief Complaint: right shoulder pain.  Planned Procedure Date: 11/21/23 Medical Clearance by Aloysius Mech, MD   Cardiac Clearance by Rosaline Booty, NP   HPI: Rebecca [name] is a 83 y.o. female who presents for evaluation of Primary osteoarthritis of right shoulder. The patient has a history of pain and functional disability in the right shoulder and has failed non-surgical conservative treatments for greater than 12 weeks to include NSAID's and/or analgesics and activity modification.  Onset of symptoms was abrupt, starting 1 years ago with rapidlly worsening course since that time. The patient noted past right rotator cuff repair on the right shoulder.  Patient currently rates pain moderate with activity. Patient has worsening of pain with activity and weight bearing and pain that interferes with activities of daily living.   There is no active infection.  Past Medical History:  Diagnosis Date   Anxiety    Arthritis    degenerative lumbar spine    Chronic neck pain    Closed fracture of base of fifth metatarsal bone 01/2018   Left   Complication of anesthesia    at one time my blood pressure dropped -lumbar - on bp med at that time   Depression    Dysphagia    esophageal stretched 9-14    Fibromyalgia    Graves disease    History of blood transfusion 2009   post - lumbar fusion    Hyperlipidemia    Hypertension    No longer takes med.   Hyperthyroidism    Osteoarthritis of left shoulder region    Mild, Dr. Josefina   Osteoporosis    Past Surgical History:  Procedure Laterality Date   ABDOMINAL HYSTERECTOMY     ANTERIOR CERVICAL DECOMP/DISCECTOMY FUSION N/A 11/20/2014   Procedure: ANTERIOR CERVICAL DECOMPRESSION/DISCECTOMY FUSION 2 LEVELS;  Surgeon: Oneil Priestly, MD;  Location: [facility] OR;  Service: Orthopedics;  Laterality: N/A;  Anterior cervical decompression  fusion, cervical 3-4, cervical 4-5 with instrumentation and allograft   arm surgery Bilateral    transposed ulner nerve 10 (dr. Camella)   BACK SURGERY  2009   Dr Onetha SANES SURGERY  01/2014   Dr Onetha   carpal tunnell  2006   bilateral   CHOLECYSTECTOMY     KNEE SURGERY  11/2009   right,  scope; left knee   NECK SURGERY  05/2008   Dr Onetha   OOPHORECTOMY Bilateral    PARTIAL KNEE ARTHROPLASTY  01/17/2012   LEFT KNEE   PARTIAL KNEE ARTHROPLASTY  01/17/2012   Procedure: UNICOMPARTMENTAL KNEE;  Surgeon: Fonda SHAUNNA Josefina, MD;  Location: [facility] OR;  Service: Orthopedics;  Laterality: Left;   PARTIAL KNEE ARTHROPLASTY Right 04/21/2020   Procedure: UNICOMPARTMENTAL KNEE;  Surgeon: Josefina Fonda, MD;  Location: [facility] ORS;  Service: Orthopedics;  Laterality: Right;   REVERSE SHOULDER ARTHROPLASTY Left 08/08/2023   Procedure: ARTHROPLASTY, SHOULDER, TOTAL, REVERSE;  Surgeon: Josefina Fonda, MD;  Location: [facility] ORS;  Service: Orthopedics;  Laterality: Left;   RHINOPLASTY  1980   SHOULDER SURGERY Bilateral    x 2/Dr. Carlos   thumb surgery Right 07/2022   Dr Camella   TONSILLECTOMY     Allergies  Allergen Reactions   Tape Itching and Other (See Comments)    White adhesive tape -blisters   Alendronate Sodium Other (See Comments)    jaw pain   Oxycodone -Acetaminophen  Hives   Cortisone  Pt received cortisone shot, and became very flushed, and hot, blisters, raw areas around lips & ears    Penicillins Swelling and Rash   Prior to Admission medications   Medication Sig Start Date End Date Taking? Authorizing Provider  acetaminophen  (TYLENOL ) 500 MG tablet Take 1,000 mg by mouth in the morning and at bedtime.   Yes [provider]  clonazePAM  (KLONOPIN ) 0.5 MG tablet Take 1 tablet (0.5 mg total) by mouth 3 (three) times daily as needed for anxiety. 11/06/23  Yes Paz, Aloysius BRAVO, MD  denosumab  (PROLIA ) 60 MG/ML SOSY injection Inject 60 mg into the skin every 6 (six) months. Dx code: M81.0.   pt has appt on 05/17/23 05/03/23  Yes Paz, Jose E, MD  loratadine (CLARITIN) 10 MG tablet Take 10 mg by mouth daily.   Yes [provider]  melatonin 5 MG TABS Take 5 mg by mouth at bedtime as needed (sleep).   Yes [provider]  methimazole  (TAPAZOLE ) 5 MG tablet Take 1 tablet by mouth in the morning and half a tablet in the afternoon. Patient taking differently: Take 5 mg by mouth daily. 07/19/23  Yes Trixie File, MD  Multiple Vitamin (MULTIVITAMIN) tablet Take 1 tablet by mouth daily.   Yes [provider]  mupirocin  ointment (BACTROBAN ) 2 % 1 Application 2 (two) times daily.   Yes [provider]  Propylene Glycol (SYSTANE BALANCE) 0.6 % SOLN Place 1 drop into both eyes daily.   Yes [provider]  vitamin B-12 (CYANOCOBALAMIN ) 100 MCG tablet Take 100 mcg by mouth daily.   Yes [provider]  vitamin C (ASCORBIC ACID) 500 MG tablet Take 500 mg by mouth daily.   Yes [provider]  escitalopram  (LEXAPRO ) 10 MG tablet Take 1 tablet (10 mg total) by mouth daily. 11/17/23   Amon Aloysius BRAVO, MD  simvastatin  (ZOCOR ) 40 MG tablet Take 1 tablet (40 mg total) by mouth at bedtime. 11/17/23   Amon Aloysius BRAVO, MD   Social History   Socioeconomic History   Marital status: Widowed    Spouse name: Rebecca [name].   Number of children: 4   Years of education: Not on file   Highest education level: Not on file  Occupational History   Occupation: stay home   Tobacco Use   Smoking status: Never   Smokeless tobacco: Never  Vaping Use   Vaping status: Never Used  Substance and Sexual Activity   Alcohol use: Yes    Comment: rare   Drug use: No   Sexual activity: Not Currently  Other Topics Concern   Not on file  Social History Narrative   Lost husband 2021   Household: pt and son Signe   Children: 3 daughters and 1 son   Social Drivers of Corporate investment banker Strain: Low Risk  (10/13/2022)   Overall Financial Resource Strain (CARDIA)     Difficulty of Paying Living Expenses: Not hard at all  Food Insecurity: No Food Insecurity (10/13/2022)   Hunger Vital Sign    Worried About Running Out of Food in the Last Year: Never true    Ran Out of Food in the Last Year: Never true  Transportation Needs: No Transportation Needs (10/13/2022)   PRAPARE - Administrator, Civil Service (Medical): No    Lack of Transportation (Non-Medical): No  Physical Activity: Inactive (10/13/2022)   Exercise Vital Sign    Days of Exercise per Week: 0 days  Minutes of Exercise per Session: 0 min  Stress: No Stress Concern Present (10/13/2022)   [employer] of Occupational Health - Occupational Stress Questionnaire    Feeling of Stress : Not at all  Social Connections: Socially Integrated (10/13/2022)   Social Connection and Isolation Panel    Frequency of Communication with Friends and Family: More than three times a week    Frequency of Social Gatherings with Friends and Family: More than three times a week    Attends Religious Services: More than 4 times per year    Active Member of [employer] or Organizations: Yes    Attends Engineer, structural: More than 4 times per year    Marital Status: Married   Family History  Problem Relation Age of Onset   Prostate cancer Father    Diabetes Sister    Heart disease Sister        sister and brothers x 2   Kidney disease Sister    Kidney disease Brother    Heart disease Brother    Stroke Daughter    Stomach cancer Maternal Aunt    Colon cancer Neg Hx    Breast cancer Neg Hx    Esophageal cancer Neg Hx    Pancreatic cancer Neg Hx     ROS: Currently denies lightheadedness, dizziness, Fever, chills, CP, SOB.   No personal history of DVT, PE, MI, or CVA. No loose teeth. + dentures All other systems have been reviewed and were otherwise currently negative with the exception of those mentioned in the HPI and as above.  BMI: Estimated body mass index is 26.76 kg/m as calculated from  the following:   Height as of 11/20/23: 5' (1.524 m).   Weight as of 11/20/23: 62.1 kg.  Lab Results  Component Value Date   ALBUMIN  4.1 07/27/2023   Diabetes: Patient does not have a diagnosis of diabetes.     Smoking Status:   reports that she has never smoked. She has never used smokeless tobacco.     Objective: Vitals: Height 4 feet 11 inches, weight 137 pounds.  BP 148/78, pulse 73, O2 98% on room air, temp 98.1. Physical Exam: General: Alert, NAD.   HEENT: EOMI, Good Neck Extension  Pulm: No increased work of breathing.  Clear B/L A/P w/o crackle or wheeze.  CV: RRR, No m/g/r appreciated  GI: soft, NT, ND Neuro: Neuro without gross focal deficit.  Sensation intact distally Skin: No lesions in the area of chief complaint MSK/Surgical Site: right shoulder pain with range of motion.  She has 0 to 70 degrees of forward flexion of the right shoulder, internal rotation to the right buttocks, external rotation to neutral. NVI distally.  Imaging Review CT scan taken on 03/20/2023 shows severe osteoarthritis of the right glenohumeral joint.  Assessment: Right shoulder rotator cuff arthropathy   Plan: Plan for Procedure(s): ARTHROPLASTY, SHOULDER, TOTAL, REVERSE  The patient history, physical exam, clinical judgement of the provider and imaging are consistent with end stage degenerative joint disease and reverse total joint arthroplasty is deemed medically necessary. The treatment options including medical management, injection therapy, and arthroplasty were discussed at length. The risks and benefits of Procedure(s): ARTHROPLASTY, SHOULDER, TOTAL, REVERSE were presented and reviewed.  The risks of nonoperative treatment, versus surgical intervention including but not limited to continued pain, aseptic loosening, stiffness, dislocation/subluxation, infection, bleeding, nerve injury, blood clots, cardiopulmonary complications, morbidity, mortality, among others were discussed. The  patient verbalizes understanding and wishes to proceed with the plan.  Patient is being admitted for surgery, OT, pain control, prophylactic antibiotics, VTE prophylaxis, progressive ambulation, ADL's and discharge planning.   The patient does meet the criteria for TXA which will be used perioperatively.   The patient is planning to be discharged home care of son.   Daymein Nunnery K Zaydrian Batta, PA-C 11/20/2023 12:42 PM

## 2023-11-20 NOTE — Patient Instructions (Addendum)
Please continue Methimazole 5 mg daily.  Please stop at the lab.  You should have an endocrinology follow-up appointment in 6 months. 

## 2023-11-21 ENCOUNTER — Encounter (HOSPITAL_COMMUNITY): Payer: Self-pay | Admitting: Orthopedic Surgery

## 2023-11-21 ENCOUNTER — Encounter (HOSPITAL_COMMUNITY)
Admission: RE | Disposition: A | Discharge status: Home / Self Care | Payer: Self-pay | Source: Home / Self Care | Attending: Orthopedic Surgery

## 2023-11-21 ENCOUNTER — Other Ambulatory Visit: Payer: Self-pay

## 2023-11-21 ENCOUNTER — Ambulatory Visit (HOSPITAL_COMMUNITY)

## 2023-11-21 ENCOUNTER — Ambulatory Visit: Payer: Self-pay | Admitting: Internal Medicine

## 2023-11-21 ENCOUNTER — Ambulatory Visit (HOSPITAL_COMMUNITY)
Admission: RE | Admit: 2023-11-21 | Discharge: 2023-11-21 | Disposition: A | Discharge status: Home / Self Care | Attending: Orthopedic Surgery | Admitting: Orthopedic Surgery

## 2023-11-21 ENCOUNTER — Ambulatory Visit (HOSPITAL_COMMUNITY): Payer: Self-pay | Admitting: Physician Assistant

## 2023-11-21 ENCOUNTER — Ambulatory Visit (HOSPITAL_BASED_OUTPATIENT_CLINIC_OR_DEPARTMENT_OTHER): Admitting: Certified Registered"

## 2023-11-21 DIAGNOSIS — M810 Age-related osteoporosis without current pathological fracture: Secondary | ICD-10-CM | POA: Insufficient documentation

## 2023-11-21 DIAGNOSIS — I10 Essential (primary) hypertension: Secondary | ICD-10-CM | POA: Insufficient documentation

## 2023-11-21 DIAGNOSIS — E0500 Thyrotoxicosis with diffuse goiter without thyrotoxic crisis or storm: Secondary | ICD-10-CM | POA: Insufficient documentation

## 2023-11-21 DIAGNOSIS — M129 Arthropathy, unspecified: Secondary | ICD-10-CM | POA: Diagnosis not present

## 2023-11-21 DIAGNOSIS — E785 Hyperlipidemia, unspecified: Secondary | ICD-10-CM | POA: Diagnosis not present

## 2023-11-21 DIAGNOSIS — M19011 Primary osteoarthritis, right shoulder: Secondary | ICD-10-CM | POA: Insufficient documentation

## 2023-11-21 DIAGNOSIS — G8918 Other acute postprocedural pain: Secondary | ICD-10-CM | POA: Diagnosis not present

## 2023-11-21 DIAGNOSIS — M797 Fibromyalgia: Secondary | ICD-10-CM | POA: Diagnosis not present

## 2023-11-21 DIAGNOSIS — Z96611 Presence of right artificial shoulder joint: Secondary | ICD-10-CM | POA: Diagnosis not present

## 2023-11-21 HISTORY — PX: REVERSE SHOULDER ARTHROPLASTY: SHX5054

## 2023-11-21 SURGERY — ARTHROPLASTY, SHOULDER, TOTAL, REVERSE
Anesthesia: Regional | Site: Shoulder | Laterality: Right

## 2023-11-21 MED ORDER — TRANEXAMIC ACID-NACL 1000-0.7 MG/100ML-% IV SOLN
1000.0000 mg | INTRAVENOUS | Status: AC
  Administered 2023-11-21: 1000 mg via INTRAVENOUS
  Filled 2023-11-21: qty 100

## 2023-11-21 MED ORDER — AMISULPRIDE (ANTIEMETIC) 5 MG/2ML IV SOLN: 10.0000 mg | Freq: Once | INTRAVENOUS | Status: DC | PRN

## 2023-11-21 MED ORDER — FENTANYL CITRATE (PF) 100 MCG/2ML IJ SOLN
INTRAMUSCULAR | Status: DC | PRN
  Administered 2023-11-21 (×2): 50 ug via INTRAVENOUS

## 2023-11-21 MED ORDER — PHENYLEPHRINE HCL-NACL 20-0.9 MG/250ML-% IV SOLN
INTRAVENOUS | Status: DC | PRN
  Administered 2023-11-21: 25 ug/min via INTRAVENOUS

## 2023-11-21 MED ORDER — CEFAZOLIN SODIUM-DEXTROSE 2-4 GM/100ML-% IV SOLN
2.0000 g | INTRAVENOUS | Status: AC
  Administered 2023-11-21: 2 g via INTRAVENOUS
  Filled 2023-11-21: qty 100

## 2023-11-21 MED ORDER — ONDANSETRON HCL 4 MG/2ML IJ SOLN
INTRAMUSCULAR | Status: AC
  Filled 2023-11-21: qty 2

## 2023-11-21 MED ORDER — GLYCOPYRROLATE 0.2 MG/ML IJ SOLN
INTRAMUSCULAR | Status: AC
  Filled 2023-11-21: qty 1

## 2023-11-21 MED ORDER — ROCURONIUM BROMIDE 10 MG/ML (PF) SYRINGE
PREFILLED_SYRINGE | INTRAVENOUS | Status: DC | PRN
  Administered 2023-11-21: 50 mg via INTRAVENOUS

## 2023-11-21 MED ORDER — LACTATED RINGERS IV SOLN: INTRAVENOUS | Status: DC

## 2023-11-21 MED ORDER — OXYCODONE HCL 5 MG/5ML PO SOLN: 5.0000 mg | Freq: Once | ORAL | Status: DC | PRN

## 2023-11-21 MED ORDER — CHLORHEXIDINE GLUCONATE 0.12 % MT SOLN
15.0000 mL | Freq: Once | OROMUCOSAL | Status: AC
  Administered 2023-11-21: 15 mL via OROMUCOSAL

## 2023-11-21 MED ORDER — OXYCODONE HCL 5 MG PO TABS: 5.0000 mg | ORAL_TABLET | Freq: Once | ORAL | Status: DC | PRN

## 2023-11-21 MED ORDER — METHIMAZOLE 5 MG PO TABS: 5.0000 mg | ORAL_TABLET | Freq: Every day | ORAL | 3 refills | Status: DC

## 2023-11-21 MED ORDER — PROPOFOL 10 MG/ML IV BOLUS
INTRAVENOUS | Status: DC | PRN
  Administered 2023-11-21: 50 mg via INTRAVENOUS

## 2023-11-21 MED ORDER — 0.9 % SODIUM CHLORIDE (POUR BTL) OPTIME
TOPICAL | Status: DC | PRN
  Administered 2023-11-21: 1000 mL

## 2023-11-21 MED ORDER — HYDRALAZINE HCL 20 MG/ML IJ SOLN
INTRAMUSCULAR | Status: AC
  Filled 2023-11-21: qty 1

## 2023-11-21 MED ORDER — LACTATED RINGERS IV BOLUS: 500.0000 mL | Freq: Once | INTRAVENOUS | Status: DC

## 2023-11-21 MED ORDER — HYDRALAZINE HCL 20 MG/ML IJ SOLN
10.0000 mg | Freq: Once | INTRAMUSCULAR | Status: AC
Start: 2023-11-21 — End: 2023-11-21
  Administered 2023-11-21: 10 mg via INTRAVENOUS

## 2023-11-21 MED ORDER — ACETAMINOPHEN 500 MG PO TABS
1000.0000 mg | ORAL_TABLET | Freq: Once | ORAL | Status: DC
Start: 2023-11-21 — End: 2023-11-21

## 2023-11-21 MED ORDER — SUGAMMADEX SODIUM 200 MG/2ML IV SOLN
INTRAVENOUS | Status: DC | PRN
  Administered 2023-11-21: 120 mg via INTRAVENOUS

## 2023-11-21 MED ORDER — BUPIVACAINE HCL (PF) 0.5 % IJ SOLN
INTRAMUSCULAR | Status: DC | PRN
  Administered 2023-11-21: 15 mL via PERINEURAL

## 2023-11-21 MED ORDER — PHENYLEPHRINE 80 MCG/ML (10ML) SYRINGE FOR IV PUSH (FOR BLOOD PRESSURE SUPPORT)
PREFILLED_SYRINGE | INTRAVENOUS | Status: DC | PRN
  Administered 2023-11-21: 80 ug via INTRAVENOUS

## 2023-11-21 MED ORDER — ACETAMINOPHEN 500 MG PO TABS
1000.0000 mg | ORAL_TABLET | Freq: Once | ORAL | Status: DC
  Filled 2023-11-21: qty 2

## 2023-11-21 MED ORDER — ORAL CARE MOUTH RINSE: 15.0000 mL | Freq: Once | OROMUCOSAL | Status: AC

## 2023-11-21 MED ORDER — ROCURONIUM BROMIDE 10 MG/ML (PF) SYRINGE
PREFILLED_SYRINGE | INTRAVENOUS | Status: AC
  Filled 2023-11-21: qty 10

## 2023-11-21 MED ORDER — METHOCARBAMOL 500 MG PO TABS: 500.0000 mg | ORAL_TABLET | Freq: Three times a day (TID) | ORAL | 0 refills | Status: DC | PRN

## 2023-11-21 MED ORDER — STERILE WATER FOR IRRIGATION IR SOLN
Status: DC | PRN
  Administered 2023-11-21: 1000 mL

## 2023-11-21 MED ORDER — POVIDONE-IODINE 10 % EX SWAB: 2.0000 | Freq: Once | CUTANEOUS | Status: DC

## 2023-11-21 MED ORDER — FENTANYL CITRATE PF 50 MCG/ML IJ SOSY: 25.0000 ug | PREFILLED_SYRINGE | INTRAMUSCULAR | Status: DC | PRN

## 2023-11-21 MED ORDER — LIDOCAINE HCL (PF) 2 % IJ SOLN
INTRAMUSCULAR | Status: AC
  Filled 2023-11-21: qty 5

## 2023-11-21 MED ORDER — LIDOCAINE 2% (20 MG/ML) 5 ML SYRINGE
INTRAMUSCULAR | Status: DC | PRN
  Administered 2023-11-21: 80 mg via INTRAVENOUS

## 2023-11-21 MED ORDER — FENTANYL CITRATE (PF) 100 MCG/2ML IJ SOLN
INTRAMUSCULAR | Status: AC
  Filled 2023-11-21: qty 2

## 2023-11-21 MED ORDER — TRAMADOL HCL 50 MG PO TABS: 50.0000 mg | ORAL_TABLET | Freq: Four times a day (QID) | ORAL | 0 refills | Status: DC | PRN

## 2023-11-21 MED ORDER — PROPOFOL 10 MG/ML IV BOLUS
INTRAVENOUS | Status: AC
  Filled 2023-11-21: qty 20

## 2023-11-21 MED ORDER — PHENYLEPHRINE 80 MCG/ML (10ML) SYRINGE FOR IV PUSH (FOR BLOOD PRESSURE SUPPORT)
PREFILLED_SYRINGE | INTRAVENOUS | Status: AC
  Filled 2023-11-21: qty 10

## 2023-11-21 MED ORDER — ONDANSETRON HCL 4 MG/2ML IJ SOLN
INTRAMUSCULAR | Status: DC | PRN
  Administered 2023-11-21: 4 mg via INTRAVENOUS

## 2023-11-21 MED ORDER — BUPIVACAINE LIPOSOME 1.3 % IJ SUSP
INTRAMUSCULAR | Status: DC | PRN
  Administered 2023-11-21: 10 mL via PERINEURAL

## 2023-11-21 MED ORDER — DEXAMETHASONE SODIUM PHOSPHATE 10 MG/ML IJ SOLN
INTRAMUSCULAR | Status: AC
  Filled 2023-11-21: qty 1

## 2023-11-21 MED ORDER — SUGAMMADEX SODIUM 200 MG/2ML IV SOLN
INTRAVENOUS | Status: AC
Start: 2023-11-21 — End: 2023-11-21
  Filled 2023-11-21: qty 2

## 2023-11-21 SURGICAL SUPPLY — 54 items
BAG COUNTER SPONGE SURGICOUNT (BAG) IMPLANT
BAG ZIPLOCK 12X15 (MISCELLANEOUS) ×2 IMPLANT
BASEPLATE GLENOSPHERE 25 (Plate) IMPLANT
BEARING HUMERAL SHLDER 36M STD (Shoulder) IMPLANT
BIT DRILL TWIST 2.7 (BIT) IMPLANT
BLADE SAW SGTL 73X25 THK (BLADE) ×2 IMPLANT
CLSR STERI-STRIP ANTIMIC 1/2X4 (GAUZE/BANDAGES/DRESSINGS) ×2 IMPLANT
COOLER ICEMAN CLASSIC (MISCELLANEOUS) ×2 IMPLANT
COVER BACK TABLE 60X90IN (DRAPES) ×2 IMPLANT
COVER SURGICAL LIGHT HANDLE (MISCELLANEOUS) ×2 IMPLANT
DRAPE POUCH INSTRU U-SHP 10X18 (DRAPES) ×2 IMPLANT
DRAPE SHEET LG 3/4 BI-LAMINATE (DRAPES) ×4 IMPLANT
DRAPE SURG 17X11 SM STRL (DRAPES) ×2 IMPLANT
DRAPE SURG 17X23 STRL (DRAPES) ×2 IMPLANT
DRAPE SURG ORHT 6 SPLT 77X108 (DRAPES) ×4 IMPLANT
DRAPE TOP 10253 STERILE (DRAPES) ×2 IMPLANT
DRAPE U-SHAPE 47X51 STRL (DRAPES) ×2 IMPLANT
DRSG MEPILEX POST OP 4X8 (GAUZE/BANDAGES/DRESSINGS) ×2 IMPLANT
DURAPREP 26ML APPLICATOR (WOUND CARE) ×4 IMPLANT
ELECT BLADE TIP CTD 4 INCH (ELECTRODE) ×2 IMPLANT
ELECT REM PT RETURN 15FT ADLT (MISCELLANEOUS) ×2 IMPLANT
FACESHIELD WRAPAROUND OR TEAM (MASK) ×2 IMPLANT
GLENOID SPHERE STD STRL 36MM (Orthopedic Implant) IMPLANT
GLOVE BIO SURGEON STRL SZ 6.5 (GLOVE) ×2 IMPLANT
GLOVE BIOGEL PI IND STRL 7.0 (GLOVE) ×2 IMPLANT
GLOVE BIOGEL PI IND STRL 8 (GLOVE) ×2 IMPLANT
GLOVE ORTHO TXT STRL SZ7.5 (GLOVE) ×2 IMPLANT
GOWN STRL SURGICAL XL XLNG (GOWN DISPOSABLE) ×4 IMPLANT
HOOD PEEL AWAY T7 (MISCELLANEOUS) ×4 IMPLANT
KIT BASIN OR (CUSTOM PROCEDURE TRAY) ×2 IMPLANT
KIT TURNOVER KIT A (KITS) ×2 IMPLANT
PACK SHOULDER (CUSTOM PROCEDURE TRAY) ×2 IMPLANT
PAD COLD SHLDR WRAP-ON (PAD) ×2 IMPLANT
PIN STEINMANN THREADED TIP (PIN) IMPLANT
PIN THREADED REVERSE (PIN) IMPLANT
RESTRAINT HEAD UNIVERSAL NS (MISCELLANEOUS) ×2 IMPLANT
SCREW BONE LOCKING 4.75X30X3.5 (Screw) IMPLANT
SCREW BONE STRL 6.5MMX25MM (Screw) IMPLANT
SCREW LOCKING NS 4.75MMX20MM (Screw) IMPLANT
SLING ARM IMMOBILIZER LRG (SOFTGOODS) ×2 IMPLANT
SLING ARM IMMOBILIZER MED (SOFTGOODS) IMPLANT
SPONGE T-LAP 4X18 ~~LOC~~+RFID (SPONGE) IMPLANT
STEM HUMERAL STRL 12MMX14MM (Stem) IMPLANT
SUCTION TUBE FRAZIER 12FR DISP (SUCTIONS) ×2 IMPLANT
SUPPORT WRAP ARM LG (MISCELLANEOUS) ×2 IMPLANT
SUT MAXBRAID #2 CVD NDL (SUTURE) IMPLANT
SUT MAXBRAID #5 CCS-NDL 2PK (SUTURE) IMPLANT
SUT MNCRL AB 3-0 PS2 18 (SUTURE) ×2 IMPLANT
SUT VIC AB 1 CT1 36 (SUTURE) ×2 IMPLANT
SUT VIC AB 2-0 CT1 TAPERPNT 27 (SUTURE) ×2 IMPLANT
TOWEL OR 17X26 10 PK STRL BLUE (TOWEL DISPOSABLE) ×2 IMPLANT
TOWER SMARTMIX MINI (MISCELLANEOUS) IMPLANT
TRAY HUM MINI SHOULDER +3 40 (Joint) IMPLANT
TUBE SUCTION HIGH CAP CLEAR NV (SUCTIONS) ×2 IMPLANT

## 2023-11-21 NOTE — Interval H&P Note (Signed)
 History and Physical Interval Note:  11/21/2023 7:13 AM  Rebecca [name]  has presented today for surgery, with the diagnosis of Primary osteoarthritis of right shoulder.  The various methods of treatment have been discussed with the patient and family. After consideration of risks, benefits and other options for treatment, the patient has consented to  Procedure(s): ARTHROPLASTY, SHOULDER, TOTAL, REVERSE (Right) as a surgical intervention.  The patient's history has been reviewed, patient examined, no change in status, stable for surgery.  I have reviewed the patient's chart and labs.  Questions were answered to the patient's satisfaction.     Fonda SHAUNNA Olmsted

## 2023-11-21 NOTE — Op Note (Signed)
 11/21/2023  9:11 AM  PATIENT:  Rebecca [name]    PRE-OPERATIVE DIAGNOSIS: Right shoulder osteoarthritis with history of rotator cuff repair  POST-OPERATIVE DIAGNOSIS:  Same  PROCEDURE: Right reverse Total Shoulder Arthroplasty  SURGEON:  Fonda SHAUNNA Olmsted, MD  PHYSICIAN ASSISTANT: Army Daring, PA-C, present and scrubbed throughout the case, critical for completion in a timely fashion, and for retraction, instrumentation, and closure.  ANESTHESIA:   General with interscalene block using Exparel   ESTIMATED BLOOD LOSS: 200 mL  UNIQUE ASPECTS OF THE CASE: Initially, cuff looked like it was intact, however it did look like there was a moderate-sized posterior rotator cuff tear.  This appeared chronic.  I had excellent exposure throughout the case, and a very good subscapularis repair with 2 rotator interval sutures, and 3 sutures through the bone.  I reamed to a size 13, but the 12 was fairly tight with press-fit, and in fact this did not even seat down completely, but it did restore the soft tissue tension nicely.  PREOPERATIVE INDICATIONS:  Rebecca [name] is a  83 y.o. female with a diagnosis of Primary osteoarthritis of right shoulder who failed conservative measures and elected for surgical management.    The risks benefits and alternatives were discussed with the patient preoperatively including but not limited to the risks of infection, bleeding, nerve injury, cardiopulmonary complications, the need for revision surgery, dislocation, brachial plexus palsy, incomplete relief of pain, among others, and the patient was willing to proceed.  OPERATIVE IMPLANTS:   Implant Name: BASEPLATE GLENOSPHERE 25 - [id number] Type: Plate Inv. Item: BASEPLATE GLENOSPHERE 25 Serial No.:  Manufacturer: [employer](ORTH,TRAU,BIO,SG) Lot No.: [id number] LRB: Right No. Used: 1 Action: Implanted   Implant Name: SCREW BONE STRL [id number] - [id number] Type: Screw Inv. Item: SCREW BONE STRL  6.4FFK74FF Serial No.:  Manufacturer: [employer](ORTH,TRAU,BIO,SG) Lot No.: [id number] LRB: Right No. Used: 1 Action: Implanted   Implant Name: DINO BAL STD STRL - [id number] Type: Orthopedic Implant Inv. Item: GLENOID SPHERE STD STRL Serial No.:  Manufacturer: [employer](ORTH,TRAU,BIO,SG) Lot No.: [id number] LRB: Right No. Used: 1 Action: Implanted   Implant Name: SCREW LOCKING NS 4.24FFK79FF - [id number] Type: Screw Inv. Item: SCREW LOCKING NS 4.24FFK79FF Serial No.:  Manufacturer: [employer](ORTH,TRAU,BIO,SG) Lot No.: [id number] LRB: Right No. Used: 1 Action: Implanted   Implant Name: SCREW BONE LOCKING [id number] - [id number] Type: Screw Inv. Item: SCREW BONE LOCKING [id number] Serial No.:  Manufacturer: [employer](ORTH,TRAU,BIO,SG) Lot No.: [id number] LRB: Right No. Used: 1 Action: Implanted   Implant Name: STEM HUMERAL STRL [id number] - [id number] Type: Stem Inv. Item: STEM HUMERAL STRL [id number] Serial No.:  Manufacturer: [employer](ORTH,TRAU,BIO,SG) Lot No.: [id number] LRB: Right No. Used: 1 Action: Implanted   Implant Name: BEARING HUMERAL SHLDER 3M STD - [id number] Type: Shoulder Inv. Item: BEARING HUMERAL SHLDER 3M STD Serial No.:  Manufacturer: [employer](ORTH,TRAU,BIO,SG) Lot No.: [id number] LRB: Right No. Used: 1 Action: Implanted   Implant Name: TRAY HUM MINI SHOULDER +3 40 - [id number] Type: Joint Inv. Item: TRAY HUM MINI SHOULDER +3 40 Serial No.:  Manufacturer: [employer](ORTH,TRAU,BIO,SG) Lot No.: [id number] LRB: Right No. Used: 1 Action: Implanted   OPERATIVE FINDINGS: Advanced arthritic changes on both the glenoid and the humeral side, without too much bone loss, with some osteophyte formation on the head, with a moderate-sized posterior rotator cuff tear.  OPERATIVE PROCEDURE: The patient was brought to the operating room and placed in the supine position. General anesthesia was administered. IV  antibiotics were given.  Time out  was performed. The upper extremity was prepped and draped in usual sterile fashion. The patient was in a beachchair position. Deltopectoral approach was carried out. The biceps was tenodesed to the pectoralis tendon with #2 Maxbraid. The subscapularis was released off of the bone.   I then performed circumferential releases of the humerus, and then dislocated the head, and then reamed with the reamer to the above named size.  I then applied the jig, and cut the humeral head in 30 of retroversion, and then turned my attention to the glenoid.  Deep retractors were placed, and I resected the labrum, and then placed a guidepin into the center position on the glenoid, with slight inferior inclination. I then reamed over the guidepin, and this created a small metaphyseal cancellus blush inferiorly, removing just the cartilage to the subchondral bone superiorly. The base plate was selected and impacted place, and then I secured it centrally with a nonlocking screw, and I had excellent purchase both inferiorly and superiorly. I placed a short locking screws on anterior and posterior aspects.  I then turned my attention to the glenosphere, and impacted this into place, placing slight inferior offset (set on B).   The glenosphere was completely seated, and had engagement of the [facility] taper. I then turned my attention back to the humerus.  I sequentially broached, and then trialed, and was found to restore soft tissue tension, and it had 2 finger tightness. Therefore the above named components were selected. The shoulder felt stable throughout functional motion.  Before I placed the real prosthesis I had also placed a total of 1 #2 and 2 #5 Maxbraid through the humerus for later subscapularis repair.   I then impacted the real prosthesis into place, as well as the real humeral tray, and reduced the shoulder. The shoulder had excellent motion, and was stable, and I irrigated  the wounds copiously.    I then used the Maxbraid suture to repair the subscapularis. This came down to bone.   I repaired the rotator interval with 2 #2 max braid figure-of-eight sutures.  I then irrigated the shoulder copiously once more, repaired the deltopectoral interval with Vicryl followed by subcutaneous Vicryl with Steri-Strips and sterile gauze for the skin. The patient was awakened and returned back in stable and satisfactory condition. There were no complications and She tolerated the procedure well.

## 2023-11-21 NOTE — Anesthesia Procedure Notes (Signed)
 Procedure Name: Intubation Date/Time: 11/21/2023 7:39 AM  Performed by: Nuel Dejaynes D, CRNAPre-anesthesia Checklist: Patient identified, Emergency Drugs available, Suction available and Patient being monitored Patient Re-evaluated:Patient Re-evaluated prior to induction Oxygen Delivery Method: Circle system utilized Preoxygenation: Pre-oxygenation with 100% oxygen Induction Type: IV induction Ventilation: Mask ventilation without difficulty Laryngoscope Size: Mac and 3 Grade View: Grade I Tube type: Oral Tube size: 7.0 mm Number of attempts: 1 Airway Equipment and Method: Stylet and Oral airway Placement Confirmation: ETT inserted through vocal cords under direct vision, positive ETCO2 and breath sounds checked- equal and bilateral Secured at: 1 cm Tube secured with: Tape Dental Injury: Teeth and Oropharynx as per pre-operative assessment

## 2023-11-21 NOTE — Addendum Note (Signed)
 Addended by: TRIXIE FILE on: 11/21/2023 11:49 AM   Modules accepted: Orders

## 2023-11-21 NOTE — Anesthesia Postprocedure Evaluation (Signed)
 Anesthesia Post Note  Patient: [facility]  Procedure(s) Performed: ARTHROPLASTY, SHOULDER, TOTAL, REVERSE (Right: Shoulder)     Patient location during evaluation: PACU Anesthesia Type: Regional and General Level of consciousness: awake and alert Pain management: pain level controlled Vital Signs Assessment: post-procedure vital signs reviewed and stable Respiratory status: spontaneous breathing, nonlabored ventilation, respiratory function stable and patient connected to nasal cannula oxygen Cardiovascular status: blood pressure returned to baseline and stable Postop Assessment: no apparent nausea or vomiting Anesthetic complications: no   No notable events documented.  Last Vitals:  Vitals:   11/21/23 1015 11/21/23 1030  BP: (!) 163/80 (!) 170/78  Pulse: 81 78  Resp: (!) 26 (!) 23  Temp:    SpO2: 95% 96%    Last Pain:  Vitals:   11/21/23 1030  TempSrc:   PainSc: 0-No pain                 Bobbiejo Ishikawa L Chantae Soo

## 2023-11-21 NOTE — Anesthesia Preprocedure Evaluation (Addendum)
 Anesthesia Evaluation  Patient identified by MRN, date of birth, ID band Patient awake    Reviewed: Allergy & Precautions, NPO status , Patient's Chart, lab work & pertinent test results  Airway Mallampati: II  TM Distance: >3 FB Neck ROM: Full    Dental  (+) Edentulous Upper, Upper Dentures, Partial Lower, Missing, Dental Advisory Given   Pulmonary neg pulmonary ROS   Pulmonary exam normal breath sounds clear to auscultation       Cardiovascular hypertension, Normal cardiovascular exam Rhythm:Regular Rate:Normal     Neuro/Psych  PSYCHIATRIC DISORDERS Anxiety Depression    negative neurological ROS     GI/Hepatic negative GI ROS, Neg liver ROS,,,  Endo/Other   Hyperthyroidism Grave's  Renal/GU negative Renal ROS  negative genitourinary   Musculoskeletal  (+) Arthritis ,  Fibromyalgia -  Abdominal   Peds  Hematology negative hematology ROS (+)   Anesthesia Other Findings   Reproductive/Obstetrics                              Anesthesia Physical Anesthesia Plan  ASA: 2  Anesthesia Plan: General and Regional   Post-op Pain Management: Regional block* and Tylenol  PO (pre-op)*   Induction: Intravenous  PONV Risk Score and Plan: 3 and Dexamethasone , Ondansetron  and Treatment may vary due to age or medical condition  Airway Management Planned: Oral ETT  Additional Equipment:   Intra-op Plan:   Post-operative Plan: Extubation in OR  Informed Consent: I have reviewed the patients History and Physical, chart, labs and discussed the procedure including the risks, benefits and alternatives for the proposed anesthesia with the patient or authorized representative who has indicated his/her understanding and acceptance.     Dental advisory given  Plan Discussed with: CRNA  Anesthesia Plan Comments:          Anesthesia Quick Evaluation

## 2023-11-21 NOTE — Evaluation (Signed)
 Occupational Therapy Evaluation Patient Details Name: Rebecca [name] MRN: [id number] DOB: 02-23-1941 Today's Date: 11/21/2023   History of Present Illness   Patient is 83 y.o. female s/p R Reverse TSA who was last here for a L Reverse TSA on 08/08/23.  PMH significant for HTN, HLD, OA, osteoporosis, depression, anxiety, Lt UKA in 2013, ACDF in 2016.     Clinical Impressions PTA pt lives with son and was indep prior to surgery. Both patient and son familiar with all educ and protocol as prior L shoulder recently and did very well with results with no deficits noted.  Education completed regarding compensatory strategies for ADL tasks and functional mobility, management of sling, R ROM per specified parameters in the order set as indicated below, positioning of operative arm in sitting and supine and edema control, including use of Iceman Cold Therapy machine. Caregiver present for education, written handouts provided and reviewed using Teach Back and pt/caregiver verbalized/demonstrated understanding. Due to the below listed deficits, pt requires min assistance with ADL tasks and mod I assist with functional mobility on level surfaces. Caregiver will be able to provide necessary level of assistance at discharge. Pt to follow up with MD to progress rehab of the operative shoulder.      If plan is discharge home, recommend the following:   A little help with walking and/or transfers;A little help with bathing/dressing/bathroom;Assistance with cooking/housework;Assist for transportation;Help with stairs or ramp for entrance     Functional Status Assessment   Patient has had a recent decline in their functional status and demonstrates the ability to make significant improvements in function in a reasonable and predictable amount of time.     Equipment Recommendations   None recommended by OT      Precautions/Restrictions   Precautions Precautions: Shoulder No shoulder ROM  allowed; elbow/wrist/hand AROM only.  Required Braces or Orthoses: Sling Restrictions Weight Bearing Restrictions Per Provider Order: Yes RUE Weight Bearing Per Provider Order: Non weight bearing     Mobility Bed Mobility Overal bed mobility:  (remained in recliner but educated on bed mobility)                  Transfers Overall transfer level: Modified independent Equipment used: None               General transfer comment: sling in place      Balance Overall balance assessment: No apparent balance deficits (not formally assessed)                                         ADL either performed or assessed with clinical judgement   ADL Overall ADL's : Needs assistance/impaired  Per orders, R shoulder parameters as follows for ADL tasks:No shoulder ROM; elbow/wrist/hand ROM only. While moving within specified parameters, pt/caregiver instructed on bathing and how to donn/doff shirt, placing operative arm through sleeve first when donning and off last when doffing.Pt/caregiver educated on compensatory strategies for LB ADL and strategies to reduce risk of falls.  Pt/caregiver educated on donning/doffing sling and to wear the sling at all times with the exception of ADL, and to loosen the neck strap of the sling when the operative arm is in a supported position when sitting. In sitting or supine, pt instructed to have a pillow behind and under their operative arm to provide support. If assist needed with ambulation, caregiver educated on the  importance of walking on pt's non-operative side.  Education regarding use of IceMan Cold Therapy completed, including the importance of using a barrier on the shoulder prior to positioning the wrap-on pad. Pt/caregiver verbalized/demonstrated understanding. Teach Back used while caregiver assisted with dressing pt and positioning wrap-on pad to facilitate DC.                                             Vision Baseline Vision/History: 0 No visual deficits;1 Wears glasses Ability to See in Adequate Light: 0 Adequate Patient Visual Report: No change from baseline Vision Assessment?: No apparent visual deficits            Pertinent Vitals/Pain Pain Assessment Pain Assessment: No/denies pain     Extremity/Trunk Assessment Upper Extremity Assessment Upper Extremity Assessment: Right hand dominant;RUE deficits/detail   Lower Extremity Assessment Lower Extremity Assessment: Overall WFL for tasks assessed   Cervical / Trunk Assessment Cervical / Trunk Assessment: Normal   Communication Communication Communication: No apparent difficulties   Cognition Arousal: Alert Behavior During Therapy: WFL for tasks assessed/performed Cognition: No apparent impairments                               Following commands: Intact       Cueing  General Comments   Cueing Techniques: Verbal cues  R post op shoulder dressing in place and intact   Exercises Exercises: Shoulder Shoulder Exercises Elbow Flexion: 10 reps, AAROM, Right Elbow Extension: AAROM, Right, 10 reps Wrist Flexion: AAROM, Right, 10 reps Wrist Extension: AAROM, Right, 10 reps Digit Composite Flexion: AAROM, Right, 10 reps Composite Extension: AAROM, Right, 10 reps   Shoulder Instructions Shoulder Instructions Donning/doffing shirt without moving shoulder: Caregiver independent with task;Patient able to independently direct caregiver Method for sponge bathing under operated UE: Caregiver independent with task;Patient able to independently direct caregiver Donning/doffing sling/immobilizer: Caregiver independent with task;Patient able to independently direct caregiver Correct positioning of sling/immobilizer: Caregiver independent with task;Patient able to independently direct caregiver ROM for elbow, wrist and digits of operated UE: Caregiver independent with task;Patient able to independently direct  caregiver Sling wearing schedule (on at all times/off for ADL's): Caregiver independent with task;Patient able to independently direct caregiver Proper positioning of operated UE when showering: Caregiver independent with task;Patient able to independently direct caregiver Positioning of UE while sleeping: Caregiver independent with task;Patient able to independently direct caregiver    Home Living Family/patient expects to be discharged to:: Private residence Living Arrangements: Children Available Help at Discharge: Family Type of Home: House Home Access: Stairs to enter Secretary/administrator of Steps: 2 Entrance Stairs-Rails: None Home Layout: Two level Alternate Level Stairs-Number of Steps: FF   Bathroom Shower/Tub: Tub/shower unit;Curtain   Bathroom Toilet: Handicapped height Bathroom Accessibility: Yes How Accessible: Accessible via walker Home Equipment: Agricultural consultant (2 wheels);Cane - single point;BSC/3in1;Shower seat;Hand held shower head;Grab bars - tub/shower   Additional Comments: grab bars in hallways as well      Prior Functioning/Environment Prior Level of Function : Independent/Modified Independent                    OT Problem List: Impaired UE functional use    AM-PAC OT 6 Clicks Daily Activity     Outcome Measure Help from another person eating meals?: None Help from another person taking care  of personal grooming?: None Help from another person toileting, which includes using toliet, bedpan, or urinal?: A Little Help from another person bathing (including washing, rinsing, drying)?: A Little Help from another person to put on and taking off regular upper body clothing?: A Little Help from another person to put on and taking off regular lower body clothing?: A Little 6 Click Score: 20   End of Session Equipment Utilized During Treatment: Gait belt Nurse Communication: Mobility status;Weight bearing status;Precautions  Activity Tolerance:  Patient tolerated treatment well Patient left: in chair;with call bell/phone within reach;with nursing/sitter in room  OT Visit Diagnosis: Other (comment) (R UE dysfunction)                Time: 1110-1140 OT Time Calculation (min): 30 min Charges:  OT Evaluation $OT Eval Low Complexity: 1 Low OT Treatments $Self Care/Home Management : 8-22 mins  Quin Mcpherson OT/L Acute Rehabilitation Department  [phone]  11/21/2023, 12:34 PM

## 2023-11-21 NOTE — Discharge Instructions (Signed)
 Shoulder Replacement Post-Operative Instructions  DIET:  Return to eating and drinking as you normally would. You will need to add some extra fluids (water ) to prevent constipation.   WOUND CARE: The post-op dressing is not water  proof, please cover your shoulder with Press n' Seal saran wrap or a plastic bag when showering If it fills with liquid or blood please call us  immediately to change it for you. There may be a small amount of fluid/bleeding leaking at the surgical site. This is normal after surgery.  Use your Ice machine or Ice as often as needed for pain relief. Always keep a towel, ACE wrap or other barrier between the cooling unit and your skin.  Do not soak the incision in water  or submerge it.  Keep dry incisions as dry as possible.  REGIONAL ANESTHESIA (NERVE BLOCKS) The anesthesia team may have performed a nerve block for you if safe in the setting of your care.  This is a great tool used to minimize pain.  If you had a block, the short acting medicine will wear off between 8-24 hrs postop typically but the long acting usually will start working around 20-24 hrs postop.  There may be a period where you have more pain while waiting for the long-acting medicine to take effect.  Please use an extra dose of pain medication if needed at this point. We suggest you use the pain medication the first night prior to going to bed, in order to ease any pain when the anesthesia wears off. You should avoid taking pain medications on an empty stomach as it will make you nauseous.  Constipation:  Is common after surgery.  To prevent this, increase your fluid intake and dietary fiber. Reduce or stop using narcotic medicine.  You may also use prune juice, Colace, or Miralax  17 grams (one cap full) in 8 ounces of something to drink 1-3 times a day until bowel movements are regular.  You getting up and moving will also help with constipation. Pain medication may make you constipated.  Below are a few  solutions to try in this order: Decrease the amount of pain medication if you aren't having pain. Drink lots of decaffeinated fluids. Drink prune juice and/or each dried prunes  Precautions: If you experience any type of sudden onset of chest pain or sudden shortness of breath call 911!! Immediately.   For other issues - Fever over 101, increased swelling on surgical side, increased pain, wound drainage; please contact  the office [phone].   We would prefer to care for you in our office rather than a trip to the ER.   Brace/Activity: A sling has been provided for you. You may remove the sling 3 times a day to move your elbow. Otherwise, you should be wearing your sling at all times.  You may remove the sling for showering, Keep your arm by your side during your shower. You may lift it minimally to wash under it and dry completely.  You may be more comfortable sleeping in a semi-seated position the first few nights following surgery.  Keep a pillow propped under the elbow and forearm for comfort.  If you have a recliner type of chair it might be beneficial.  If not, that is fine too, but it would be helpful to sleep propped up with pillows behind your operated shoulder as well under your elbow and forearm. You may return to work/school in the next couple of days when you feel up to it. Desk  work and typing in the sling is fine. When dressing, put your operative arm in the sleeve first.  When getting undressed, take your operative arm out last.  Loose fitting, button-down shirts are recommended.  Often in the first days after surgery you may be more comfortable keeping your operative arm under your shirt and not through the sleeve. Do not lift anything heavier than 1 pound until we discuss it further in clinic.  Post op follow up:  Your first follow-up visit after surgery will be made prior to your surgery. This will be scheduled for approximately 2 weeks after surgery.  Your physical therapy  schedule will be discussed with your surgeon.  Thank you for choosing our office to care for you!

## 2023-11-21 NOTE — Anesthesia Procedure Notes (Signed)
 Anesthesia Regional Block: Interscalene brachial plexus block   Pre-Anesthetic Checklist: , timeout performed,  Correct Patient, Correct Site, Correct Laterality,  Correct Procedure, Correct Position, site marked,  Risks and benefits discussed,  Pre-op evaluation,  At surgeon's request and post-op pain management  Laterality: Right  Prep: Maximum Sterile Barrier Precautions used, chloraprep       Needles:  Injection technique: Single-shot  Needle Type: Echogenic Stimulator Needle     Needle Length: 5cm  Needle Gauge: 21     Additional Needles:   Procedures:,,,, ultrasound used (permanent image in chart),,    Narrative:  Start time: 11/21/2023 6:55 AM End time: 11/21/2023 6:59 AM Injection made incrementally with aspirations every 5 mL. Anesthesiologist: Niels Marien CROME, MD

## 2023-11-21 NOTE — Transfer of Care (Signed)
 Immediate Anesthesia Transfer of Care Note  Patient: [facility]  Procedure(s) Performed: ARTHROPLASTY, SHOULDER, TOTAL, REVERSE (Right: Shoulder)  Patient Location: PACU  Anesthesia Type:General  Level of Consciousness: awake, alert , and oriented  Airway & Oxygen Therapy: Patient Spontanous Breathing and Patient connected to face mask oxygen  Post-op Assessment: Report given to RN and Post -op Vital signs reviewed and stable  Post vital signs: Reviewed and stable  Last Vitals:  Vitals Value Taken Time  BP 167/95 11/21/23 09:36  Temp    Pulse 82 11/21/23 09:37  Resp 18 11/21/23 09:37  SpO2 100 % 11/21/23 09:37  Vitals shown include unfiled device data.  Last Pain:  Vitals:   11/21/23 0552  TempSrc: Oral  PainSc: 0-No pain         Complications: No notable events documented.

## 2023-11-22 ENCOUNTER — Encounter (HOSPITAL_COMMUNITY): Payer: Self-pay | Admitting: Orthopedic Surgery

## 2023-12-06 DIAGNOSIS — M19011 Primary osteoarthritis, right shoulder: Secondary | ICD-10-CM | POA: Diagnosis not present

## 2023-12-13 DIAGNOSIS — E785 Hyperlipidemia, unspecified: Secondary | ICD-10-CM | POA: Diagnosis not present

## 2023-12-13 DIAGNOSIS — H04123 Dry eye syndrome of bilateral lacrimal glands: Secondary | ICD-10-CM | POA: Diagnosis not present

## 2023-12-13 DIAGNOSIS — M1990 Unspecified osteoarthritis, unspecified site: Secondary | ICD-10-CM | POA: Diagnosis not present

## 2023-12-13 DIAGNOSIS — E0590 Thyrotoxicosis, unspecified without thyrotoxic crisis or storm: Secondary | ICD-10-CM | POA: Diagnosis not present

## 2023-12-13 DIAGNOSIS — E663 Overweight: Secondary | ICD-10-CM | POA: Diagnosis not present

## 2023-12-13 DIAGNOSIS — F330 Major depressive disorder, recurrent, mild: Secondary | ICD-10-CM | POA: Diagnosis not present

## 2023-12-13 DIAGNOSIS — G8929 Other chronic pain: Secondary | ICD-10-CM | POA: Diagnosis not present

## 2023-12-13 DIAGNOSIS — F419 Anxiety disorder, unspecified: Secondary | ICD-10-CM | POA: Diagnosis not present

## 2023-12-19 ENCOUNTER — Other Ambulatory Visit: Payer: Self-pay

## 2023-12-19 ENCOUNTER — Other Ambulatory Visit: Payer: Self-pay | Admitting: Internal Medicine

## 2023-12-19 ENCOUNTER — Telehealth: Payer: Self-pay

## 2023-12-19 DIAGNOSIS — M810 Age-related osteoporosis without current pathological fracture: Secondary | ICD-10-CM

## 2023-12-19 MED ORDER — PROLIA 60 MG/ML ~~LOC~~ SOSY
60.0000 mg | PREFILLED_SYRINGE | SUBCUTANEOUS | 0 refills | Status: DC
  Filled 2023-12-19 (×2): qty 1, 180d supply, fill #0

## 2023-12-19 NOTE — Telephone Encounter (Signed)
 This patient's ins no longer covers Prolia . Plan prefers Jubbonti. I know the office still cannot document for Jubbonti, but I wanted to let you know, so you could send a new prescription for her once your system is updated. Thanks!

## 2023-12-20 MED ORDER — DENOSUMAB-BBDZ 60 MG/ML ~~LOC~~ SOSY
60.0000 mg | PREFILLED_SYRINGE | Freq: Once | SUBCUTANEOUS | Status: AC
  Administered 2024-01-24: 60 mg via SUBCUTANEOUS

## 2023-12-20 NOTE — Telephone Encounter (Signed)
 Referral process has been started and will send in medication when authorization is done.

## 2023-12-21 ENCOUNTER — Other Ambulatory Visit: Payer: Self-pay

## 2023-12-28 ENCOUNTER — Other Ambulatory Visit (HOSPITAL_COMMUNITY): Payer: Self-pay

## 2023-12-28 DIAGNOSIS — E0500 Thyrotoxicosis with diffuse goiter without thyrotoxic crisis or storm: Secondary | ICD-10-CM | POA: Insufficient documentation

## 2023-12-29 ENCOUNTER — Encounter: Payer: Self-pay | Admitting: Internal Medicine

## 2023-12-29 ENCOUNTER — Ambulatory Visit (INDEPENDENT_AMBULATORY_CARE_PROVIDER_SITE_OTHER): Admitting: Internal Medicine

## 2023-12-29 VITALS — BP 132/80 | HR 68 | Temp 98.1°F | Resp 16 | Ht 60.0 in | Wt 146.1 lb

## 2023-12-29 DIAGNOSIS — F32A Depression, unspecified: Secondary | ICD-10-CM | POA: Diagnosis not present

## 2023-12-29 DIAGNOSIS — Z79899 Other long term (current) drug therapy: Secondary | ICD-10-CM

## 2023-12-29 DIAGNOSIS — Z23 Encounter for immunization: Secondary | ICD-10-CM | POA: Diagnosis not present

## 2023-12-29 DIAGNOSIS — E785 Hyperlipidemia, unspecified: Secondary | ICD-10-CM

## 2023-12-29 DIAGNOSIS — F419 Anxiety disorder, unspecified: Secondary | ICD-10-CM | POA: Diagnosis not present

## 2023-12-29 DIAGNOSIS — Z0001 Encounter for general adult medical examination with abnormal findings: Secondary | ICD-10-CM | POA: Diagnosis not present

## 2023-12-29 DIAGNOSIS — E0500 Thyrotoxicosis with diffuse goiter without thyrotoxic crisis or storm: Secondary | ICD-10-CM | POA: Diagnosis not present

## 2023-12-29 DIAGNOSIS — Z0000 Encounter for general adult medical examination without abnormal findings: Secondary | ICD-10-CM

## 2023-12-29 NOTE — Patient Instructions (Addendum)
 GO TO THE LAB :  Get the blood work    Then, go to the front desk for the checkout Please make an appointment for a checkup in 4 to 5 months   You got a flu shot today and a pneumonia shot. Please consider getting a COVID booster       Please read more detailed instructions below   YOUR PLAN: RIGHT SHOULDER POST-SURGICAL STATE: Your right shoulder is healing well after your second surgery, and you have good mobility. -No specific treatment needed at this time.  GRAVES' DISEASE: Your Yvone' disease is being managed well with medication, and your symptoms have improved. -Continue taking methimazole  as prescribed. -Monitor CBC, AST, ALT, and thyroid  tests. -Follow up with your endocrinologist in March.  OSTEOARTHRITIS: Your arthritis is well-managed, and you have no significant limitations. -Continue current management with Tylenol  as needed. -You can continue using the turmeric supplement if it is beneficial.  THROMBOCYTOPENIA: Your low platelet count is being monitored, and there are no symptoms requiring intervention at this time. -Continue monitoring your platelet levels. -Consult a hematologist if symptoms develop.  ANXIETY AND DEPRESSION: Your anxiety and depression are well-controlled with your current medications. -Continue taking clonazepam  and escitalopram  as prescribed.  VITAMIN D  DEFICIENCY: Your vitamin D  levels are satisfactory with your current supplements. -Continue your current vitamin D  supplementation.  IMMUNIZATION STATUS AND PREVENTIVE CARE: We discussed your immunization status today. -Consider getting the influenza vaccination. -You have already received the RSV vaccine.

## 2023-12-29 NOTE — Progress Notes (Signed)
 "  Subjective:    Patient ID: Rebecca [name], female    DOB: 1940-11-27, 83 y.o.   MRN: [id number]  DOS:  12/29/2023 CPX  Wt Readings from Last 3 Encounters:  12/29/23 146 lb 2 oz (66.3 kg)  11/21/23 137 lb (62.1 kg)  11/20/23 137 lb (62.1 kg)   Discussed the use of AI scribe software for clinical note transcription with the patient, who gave verbal consent to proceed.  History of Present Illness  Postoperative right shoulder status - Status post second right shoulder surgery with successful outcome - Able to move right arm freely without limitation  Thyroid  dysfunction - Graves' disease managed by endocrinologist - Weight loss and fatigue have improved with thyroid  medication - Endocrinology follow-up scheduled for March  Mood and anxiety symptoms - Takes clonazepam  and Lexapro  for anxiety and depression  Arthralgia and mobility - Arthritis managed with Tylenol  as needed - Remains active  Immunization status and preventive care - Considering influenza vaccination today - Previously received RSV vaccine  Nutritional supplementation - Takes a multivitamin and turmeric supplement containing vitamin D  - Concerned about adequate vitamin D  intake    Review of Systems See above   Past Medical History:  Diagnosis Date   Anxiety    Arthritis    degenerative lumbar spine    Chronic neck pain    Closed fracture of base of fifth metatarsal bone 01/2018   Left   Complication of anesthesia    at one time my blood pressure dropped -lumbar - on bp med at that time   Depression    Dysphagia    esophageal stretched 9-14    Fibromyalgia    Graves disease    History of blood transfusion 2009   post - lumbar fusion    Hyperlipidemia    Hypertension    No longer takes med.   Hyperthyroidism    Osteoarthritis of left shoulder region    Mild, Dr. Josefina   Osteoporosis     Past Surgical History:  Procedure Laterality Date   ABDOMINAL HYSTERECTOMY     ANTERIOR  CERVICAL DECOMP/DISCECTOMY FUSION N/A 11/20/2014   Procedure: ANTERIOR CERVICAL DECOMPRESSION/DISCECTOMY FUSION 2 LEVELS;  Surgeon: Oneil Priestly, MD;  Location: [facility] OR;  Service: Orthopedics;  Laterality: N/A;  Anterior cervical decompression fusion, cervical 3-4, cervical 4-5 with instrumentation and allograft   arm surgery Bilateral    transposed ulner nerve 10 (dr. Camella)   BACK SURGERY  2009   Dr Onetha SANES SURGERY  01/2014   Dr Onetha   carpal tunnell  2006   bilateral   CHOLECYSTECTOMY     KNEE SURGERY  11/2009   right,  scope; left knee   NECK SURGERY  05/2008   Dr Onetha   OOPHORECTOMY Bilateral    PARTIAL KNEE ARTHROPLASTY  01/17/2012   LEFT KNEE   PARTIAL KNEE ARTHROPLASTY  01/17/2012   Procedure: UNICOMPARTMENTAL KNEE;  Surgeon: Fonda SHAUNNA Josefina, MD;  Location: [facility] OR;  Service: Orthopedics;  Laterality: Left;   PARTIAL KNEE ARTHROPLASTY Right 04/21/2020   Procedure: UNICOMPARTMENTAL KNEE;  Surgeon: Josefina Fonda, MD;  Location: [facility] ORS;  Service: Orthopedics;  Laterality: Right;   REVERSE SHOULDER ARTHROPLASTY Left 08/08/2023   Procedure: ARTHROPLASTY, SHOULDER, TOTAL, REVERSE;  Surgeon: Josefina Fonda, MD;  Location: [facility] ORS;  Service: Orthopedics;  Laterality: Left;   REVERSE SHOULDER ARTHROPLASTY Right 11/21/2023   Procedure: ARTHROPLASTY, SHOULDER, TOTAL, REVERSE;  Surgeon: Josefina Fonda, MD;  Location: [facility] ORS;  Service: Orthopedics;  Laterality: Right;   RHINOPLASTY  1980   SHOULDER SURGERY Bilateral    x 2/Dr. Carlos   thumb surgery Right 07/2022   Dr Camella   TONSILLECTOMY      Current Outpatient Medications  Medication Instructions   acetaminophen  (TYLENOL ) 1,000 mg, 2 times daily   ascorbic acid (VITAMIN C) 500 mg, Daily   clonazePAM  (KLONOPIN ) 0.5 mg, Oral, 3 times daily PRN   escitalopram  (LEXAPRO ) 10 mg, Oral, Daily   loratadine (CLARITIN) 10 mg, Daily   melatonin 5 mg, At bedtime PRN   methimazole  (TAPAZOLE ) 5 mg, Oral, Daily   methocarbamol  (ROBAXIN )  500 mg, Oral, Every 8 hours PRN   Multiple Vitamin (MULTIVITAMIN) tablet 1 tablet, Daily   Propylene Glycol (SYSTANE BALANCE) 0.6 % SOLN 1 drop, Daily   simvastatin  (ZOCOR ) 40 mg, Oral, Daily at bedtime   vitamin B-12 (CYANOCOBALAMIN ) 100 mcg, Daily       Objective:   Physical Exam BP 132/80   Pulse 68   Temp 98.1 F (36.7 C) (Oral)   Resp 16   Ht 5' (1.524 m)   Wt 146 lb 2 oz (66.3 kg)   SpO2 98%   BMI 28.54 kg/m  General: Well developed, NAD, BMI noted Neck: No  thyromegaly  HEENT:  Normocephalic . Face symmetric, atraumatic Lungs:  CTA B Normal respiratory effort, no intercostal retractions, no accessory muscle use. Heart: RRR,  no murmur.  Abdomen:  Not distended, soft, non-tender. No rebound or rigidity.   Lower extremities: no pretibial edema bilaterally MSK: Right arm on a sling but she remove the sling temporarily and the range of motion was very good  Skin: Exposed areas without rash. Not pale. Not jaundice Neurologic:  alert & oriented X3.  Speech normal, gait appropriate for age and unassisted Strength symmetric and appropriate for age.  Psych: Cognition and judgment appear intact.  Cooperative with normal attention span and concentration.  Behavior appropriate. No anxious or depressed appearing.     Assessment   Assessment  HTN -- on no med since ~ 2009 Hyperlipidemia Anxiety depression: onset Fall 2016 d/t husband health, lost sister 03-2015, lost husband 2021 GI --GERD --Esophageal stricture stretched 2014  --EGD/2017--->  gastritis, bx---reactive gastropathy, no H. pylori. Increased LFTs: 03-2021: US  FAtty liver and  hep serology (-); 09-2021 labs neg (  transferrin saturation, SLE,   ANA, anti-smooth muscle antibodies ) MSK: --DJD; Chronic neck pain ;  spinal stenosis;  multiple surgeries Osteoporosis DEXA osteoprosis 2006  (per pt) DEXA  11-08 normal   DEXA  9-12 mild osteopenia, rec exercise, ca and vit D T score 05-2015:  (-) 1.4 T score  06/19/2017:  (-) 2.1, RX  vit d, exercise , recheck in 2 years T score -1.8   08/2019 Osteoporosis.  Per DEXA 09-2022, discussed Fosamax versus Prolia , Got her first Prolia  11/17/2022. Ao US  03-2015 - no AAA Thrombocytopenia, BM Bx 03-2021   Assessment & Plan Here for CPX -  Td:  05-2016 - PNM  23: 2008 and 07/2017; prevnar 2015.   PMM 20:  2025 - zostavax:  12-2009; s/p  shingrix   - had a RSV 12/06/2023 - flu shot and PNM 20 today. - Recommend: COVID booster   --- See previous entries, no further breast, colon or cervical cancer screening. -- (+) FH CAD, pt asx, on statins ---Palpable aorta: US  Ao 03-2015 wnl  -- POA on file  -Labs: AST ALT CBC free T4 TSH FLP  Other issues discussed today Graves' disease  Graves' disease per endocrinology, nets visit in march 2026.  Improved symptoms of weight loss and fatigue. - Continue methimazole  as prescribed. - Monitor CBC, AST, ALT, and thyroid  tests. DJD, right shoulder post-surgical state Doing well postsurgery with improved mobility, only taking Tylenol  as needed  Thrombocytopenia: Used to see hematology, request to be follow-up here, checking a CBC.   Consult hematologist prn Anxiety and depression well-controlled with current medication regimen. Continue clonazepam  and escitalopram    Orthostatic hypotension: History of, used to see cardiologist, symptoms resolved. Vitamin D  deficiency Vitamin D  deficiency managed with OTC supplements.  No change Osteoporosis: On Prolia  RTC 6 months.  Sooner if needed     "

## 2023-12-30 LAB — CBC WITH DIFFERENTIAL/PLATELET
Absolute Lymphocytes: 1292 {cells}/uL (ref 850–3900)
Absolute Monocytes: 459 {cells}/uL (ref 200–950)
Basophils Absolute: 18 {cells}/uL (ref 0–200)
Basophils Relative: 0.4 %
Eosinophils Absolute: 131 {cells}/uL (ref 15–500)
Eosinophils Relative: 2.9 %
HCT: 32 % — ABNORMAL LOW (ref 35.0–45.0)
Hemoglobin: 10.5 g/dL — ABNORMAL LOW (ref 11.7–15.5)
MCH: 30.8 pg (ref 27.0–33.0)
MCHC: 32.8 g/dL (ref 32.0–36.0)
MCV: 93.8 fL (ref 80.0–100.0)
MPV: 10.5 fL (ref 7.5–12.5)
Monocytes Relative: 10.2 %
Neutro Abs: 2601 {cells}/uL (ref 1500–7800)
Neutrophils Relative %: 57.8 %
Platelets: 106 Thousand/uL — ABNORMAL LOW (ref 140–400)
RBC: 3.41 Million/uL — ABNORMAL LOW (ref 3.80–5.10)
RDW: 12.2 % (ref 11.0–15.0)
Total Lymphocyte: 28.7 %
WBC: 4.5 Thousand/uL (ref 3.8–10.8)

## 2023-12-30 LAB — LIPID PANEL
Cholesterol: 124 mg/dL (ref <200)
HDL: 59 mg/dL (ref >=50)
LDL Cholesterol (Calc): 43 mg/dL
Non-HDL Cholesterol (Calc): 65 mg/dL (ref <130)
Total CHOL/HDL Ratio: 2.1 (calc) (ref <5.0)
Triglycerides: 139 mg/dL (ref <150)

## 2023-12-30 LAB — TSH: TSH: 0.04 m[IU]/L — ABNORMAL LOW (ref 0.40–4.50)

## 2023-12-30 LAB — AST: AST: 26 U/L (ref 10–35)

## 2023-12-30 LAB — ALT: ALT: 25 U/L (ref 6–29)

## 2023-12-30 LAB — T3, FREE: T3, Free: 3.6 pg/mL (ref 2.3–4.2)

## 2023-12-30 LAB — T4, FREE: Free T4: 1.2 ng/dL (ref 0.8–1.8)

## 2023-12-31 ENCOUNTER — Ambulatory Visit: Payer: Self-pay | Admitting: Internal Medicine

## 2023-12-31 ENCOUNTER — Encounter: Payer: Self-pay | Admitting: Internal Medicine

## 2023-12-31 LAB — DM TEMPLATE

## 2023-12-31 NOTE — Assessment & Plan Note (Signed)
 Here for CPX -  Td:  05-2016 - PNM  23: 2008 and 07/2017; prevnar 2015.   PMM 20:  2025 - zostavax:  12-2009; s/p  shingrix   - had a RSV 12/06/2023 - flu shot and PNM 20 today. - Recommend: COVID booster   --- See previous entries, no further breast, colon or cervical cancer screening. -- (+) FH CAD, pt asx, on statins ---Palpable aorta: US  Ao 03-2015 wnl  -- POA on file  -Labs: AST ALT CBC free T4 TSH FLP

## 2023-12-31 NOTE — Assessment & Plan Note (Signed)
 Here for CPX   Other issues discussed today Graves' disease Graves' disease per endocrinology, nets visit in march 2026.  Improved symptoms of weight loss and fatigue. - Continue methimazole  as prescribed. - Monitor CBC, AST, ALT, and thyroid  tests. DJD, right shoulder post-surgical state Doing well postsurgery with improved mobility, only taking Tylenol  as needed  Thrombocytopenia: Used to see hematology, request to be follow-up here, checking a CBC.   Consult hematologist prn Anxiety and depression well-controlled with current medication regimen. Continue clonazepam  and escitalopram    Orthostatic hypotension: History of, used to see cardiologist, symptoms resolved. Vitamin D  deficiency Vitamin D  deficiency managed with OTC supplements.  No change Osteoporosis: On Prolia  RTC 6 months.  Sooner if needed

## 2024-01-01 ENCOUNTER — Other Ambulatory Visit (HOSPITAL_COMMUNITY): Payer: Self-pay

## 2024-01-01 ENCOUNTER — Encounter: Payer: Self-pay | Admitting: Internal Medicine

## 2024-01-01 DIAGNOSIS — M6281 Muscle weakness (generalized): Secondary | ICD-10-CM | POA: Diagnosis not present

## 2024-01-01 DIAGNOSIS — M19011 Primary osteoarthritis, right shoulder: Secondary | ICD-10-CM | POA: Diagnosis not present

## 2024-01-01 DIAGNOSIS — M25611 Stiffness of right shoulder, not elsewhere classified: Secondary | ICD-10-CM | POA: Diagnosis not present

## 2024-01-01 LAB — DRUG MONITORING PANEL 375977 , URINE
Alphahydroxyalprazolam: NEGATIVE ng/mL (ref <25)
Alphahydroxymidazolam: NEGATIVE ng/mL (ref <50)
Alphahydroxytriazolam: NEGATIVE ng/mL (ref <50)
Aminoclonazepam: 788 ng/mL — ABNORMAL HIGH (ref <25)
Amphetamines: NEGATIVE ng/mL (ref <500)
Barbiturates: NEGATIVE ng/mL (ref <300)
Benzodiazepines: POSITIVE ng/mL — AB (ref <100)
Cocaine Metabolite: NEGATIVE ng/mL (ref <150)
Desmethyltramadol: NEGATIVE ng/mL (ref <100)
Hydroxyethylflurazepam: NEGATIVE ng/mL (ref <50)
Lorazepam: NEGATIVE ng/mL (ref <50)
Marijuana Metabolite: NEGATIVE ng/mL (ref <20)
Nordiazepam: NEGATIVE ng/mL (ref <50)
Opiates: NEGATIVE ng/mL (ref <100)
Oxazepam: NEGATIVE ng/mL (ref <50)
Oxycodone: NEGATIVE ng/mL (ref <100)
Temazepam: NEGATIVE ng/mL (ref <50)
Tramadol: NEGATIVE ng/mL (ref <100)

## 2024-01-01 LAB — DM TEMPLATE

## 2024-01-03 ENCOUNTER — Telehealth: Payer: Self-pay

## 2024-01-03 DIAGNOSIS — E0500 Thyrotoxicosis with diffuse goiter without thyrotoxic crisis or storm: Secondary | ICD-10-CM

## 2024-01-03 MED ORDER — METHOCARBAMOL 500 MG PO TABS: 500.0000 mg | ORAL_TABLET | Freq: Three times a day (TID) | ORAL | 0 refills | Status: DC | PRN

## 2024-01-03 NOTE — Telephone Encounter (Signed)
-----   Message from Lela Fendt sent at 01/01/2024 11:36 AM EDT ----- Regarding: RE: Graves' disease, TSH Shauna Bodkins, The patient's TSH was quite suppressed.  Can you please call her and see if she missed any methimazole  doses?  If so, she needs to start it consistently and then we can check her blood sugars again in 1 month.  If she did not miss doses, let's double up on the dose, to 5 mg twice a day and let's schedule her for a TSH, free T4, and free T3 in 1 month. Thank you! CG ----- Message ----- From: Amon Aloysius BRAVO, MD Sent: 12/31/2023   1:14 PM EDT To: Aloysius BRAVO Amon, MD; Lela Fendt, MD Subject: Yvone' disease, TSH                           Tawni, She has a follow-up with you in March, her TSH is slightly low, would you like me to adjust her medication? [employer]

## 2024-01-03 NOTE — Telephone Encounter (Signed)
 I called and spoke with the patient, and she states she did not miss any doses so she will take 5 mg BID and come back In 1 month for labs.   Orders Placed This Encounter  Procedures   T4, free   T3, free   TSH

## 2024-01-03 NOTE — Telephone Encounter (Signed)
 Rx faxed (for some reason it printed).

## 2024-01-03 NOTE — Addendum Note (Signed)
 Addended by: Eleonor Ocon D on: 01/03/2024 03:30 PM   Modules accepted: Orders

## 2024-01-03 NOTE — Telephone Encounter (Signed)
 Copied from CRM #[id number]. Topic: Clinical - Medication Question >> Jan 03, 2024  2:24 PM Mesmerise C wrote: Reason for CRM: Patient inquiring if Dr. Amon want her to continue taking methocarbamol  (ROBAXIN ) 500 MG tablet on a regular basis it was prescribed to her at the time of her surgery and Dr. Amon was supposed to be taking over the prescription

## 2024-01-03 NOTE — Telephone Encounter (Signed)
 Methocarbamol  is a muscle relaxant, can be taken as needed for stiffness. I am not opposed she continue taking it if it helps and does not make her drowsy. Okay to send prescription for 30 additional tablets if so desired

## 2024-01-03 NOTE — Telephone Encounter (Signed)
 Spoke w/ Lynwood- Pts son (on [place])- informed refill has been sent.

## 2024-01-05 ENCOUNTER — Telehealth: Payer: Self-pay | Admitting: Internal Medicine

## 2024-01-05 ENCOUNTER — Other Ambulatory Visit: Payer: Self-pay

## 2024-01-05 MED ORDER — METHOCARBAMOL 500 MG PO TABS: 500.0000 mg | ORAL_TABLET | Freq: Three times a day (TID) | ORAL | 0 refills | Status: AC | PRN

## 2024-01-05 MED ORDER — METHIMAZOLE 5 MG PO TABS: 5.0000 mg | ORAL_TABLET | Freq: Two times a day (BID) | ORAL | 2 refills | Status: DC

## 2024-01-05 NOTE — Telephone Encounter (Signed)
 Copied from CRM [id number]. Topic: Clinical - Medication Refill >> Jan 05, 2024  9:24 AM Rebecca [name] wrote: Medication: methocarbamol  (ROBAXIN ) 500 MG tablet  Has the patient contacted their pharmacy? Yes (Agent: If no, request that the patient contact the pharmacy for the refill. If patient does not wish to contact the pharmacy document the reason why and proceed with request.) (Agent: If yes, when and what did the pharmacy advise?)  This is the patient's preferred pharmacy:  [id number] [place], [place] - [address] Rebecca [name] Rebecca [name] [place] [address] Phone: [phone] Fax: [phone]  Is this the correct pharmacy for this prescription? Yes If no, delete pharmacy and type the correct one.   Has the prescription been filled recently? Yes  Is the patient out of the medication? Yes  Has the patient been seen for an appointment in the last year OR does the patient have an upcoming appointment? Yes  Can we respond through MyChart? Yes  Agent: Please be advised that Rx refills may take up to 3 business days. We ask that you follow-up with your pharmacy.

## 2024-01-05 NOTE — Telephone Encounter (Signed)
 Error CRM sent. Methocarbamol  was refilled to [name] on 01/03/24. There is even a telephone note showing that I spoke w/ Pt's son Lynwood informing that prescription had been sent.

## 2024-01-05 NOTE — Telephone Encounter (Signed)
 Rx sent again

## 2024-01-05 NOTE — Telephone Encounter (Unsigned)
 Copied from CRM [id number]. Topic: Clinical - Medication Refill >> Jan 05, 2024  9:24 AM Tanazia G wrote: Medication: methocarbamol  (ROBAXIN ) 500 MG tablet  Has the patient contacted their pharmacy? Yes (Agent: If no, request that the patient contact the pharmacy for the refill. If patient does not wish to contact the pharmacy document the reason why and proceed with request.) (Agent: If yes, when and what did the pharmacy advise?)  This is the patient's preferred pharmacy:  [id number] [place], [place] - [address]ON [place] [address] Phone: [phone] Fax: [phone]  Is this the correct pharmacy for this prescription? Yes If no, delete pharmacy and type the correct one.   Has the prescription been filled recently? Yes  Is the patient out of the medication? Yes  Has the patient been seen for an appointment in the last year OR does the patient have an upcoming appointment? Yes  Can we respond through MyChart? Yes  Agent: Please be advised that Rx refills may take up to 3 business days. We ask that you follow-up with your pharmacy. >> Jan 05, 2024 11:04 AM Rosaria A wrote: Pulled call. Call ID/JTAPI ID: [id number]. Patient reached out to the pharmacy and pharmacy is stating they never received a refill request for the medication. Chart is showing medication was sent to the pharmacy on 01/03/2024. Could the prescription be re sent back to the pharmacy for refill. Patient is out of medication. methocarbamol  (ROBAXIN ) 500 MG tablet  [id number] - [place], [place] - [address] Phone: [phone]  Fax: [phone]

## 2024-01-18 ENCOUNTER — Other Ambulatory Visit (HOSPITAL_COMMUNITY): Payer: Self-pay

## 2024-01-18 ENCOUNTER — Telehealth: Payer: Self-pay | Admitting: *Deleted

## 2024-01-18 ENCOUNTER — Other Ambulatory Visit: Payer: Self-pay

## 2024-01-18 ENCOUNTER — Other Ambulatory Visit: Payer: Self-pay | Admitting: *Deleted

## 2024-01-18 ENCOUNTER — Telehealth: Payer: Self-pay

## 2024-01-18 MED ORDER — JUBBONTI 60 MG/ML ~~LOC~~ SOSY
60.0000 mg | PREFILLED_SYRINGE | SUBCUTANEOUS | 0 refills | Status: AC
  Filled 2024-01-19: qty 1, 180d supply, fill #0

## 2024-01-18 MED ORDER — JUBBONTI 60 MG/ML ~~LOC~~ SOSY: 60.0000 mg | PREFILLED_SYRINGE | SUBCUTANEOUS | 0 refills | Status: DC

## 2024-01-18 NOTE — Telephone Encounter (Signed)
 Pt ready for scheduling for JUBBONTI on or after : 01/18/24  Option# 2- Med Obtained from pharmacy:  Pharmacy benefit: Copay $250 (Paid to pharmacy) Admin Fee: 0% (Pay at clinic)  Prior Auth: N/A PA# Expiration Date:   # of doses approved:   If patient wants fill through the pharmacy benefit please send prescription to: [facility], and include estimated need by date in rx notes. Pharmacy will ship medication directly to the office.  Patient NOT eligible for Prolia  Copay Card. Copay Card can make patient's cost as little as $25. Link to apply: https://www.amgensupportplus.com/copay  ** This summary of benefits is an estimation of the patient's out-of-pocket cost. Exact cost may very based on individual plan coverage.

## 2024-01-18 NOTE — Progress Notes (Signed)
 Pharmacy Patient Advocate Encounter  Insurance verification completed.   The patient is insured through [facility] ADVANTAGE/RX ADVANCE   Ran test claim for Jubbonti . Co-pay is $250.  This test claim was processed through [facility]- copay amounts may vary at other pharmacies due to pharmacy/plan contracts, or as the patient moves through the different stages of their insurance plan.

## 2024-01-18 NOTE — Telephone Encounter (Signed)
 I put in order for Rebecca [name].  We know it will be $250 but can the referral be authorized?

## 2024-01-18 NOTE — Telephone Encounter (Signed)
 Referral completed

## 2024-01-19 ENCOUNTER — Other Ambulatory Visit: Payer: Self-pay

## 2024-01-19 NOTE — Progress Notes (Signed)
 Specialty Pharmacy Initial Fill Coordination Note  Rebecca [name] is a 83 y.o. female contacted today regarding initial fill of specialty medication(s) Denosumab -bbdz (Jubbonti)   Patient requested Courier to Provider Office   Delivery date: 01/23/24   Verified address: [facility] [place] [address] [employer] [address]   Medication will be filled on: 01/22/24   Patient is aware of $250 copayment.

## 2024-01-22 ENCOUNTER — Telehealth: Payer: Self-pay | Admitting: Internal Medicine

## 2024-01-22 ENCOUNTER — Other Ambulatory Visit: Payer: Self-pay

## 2024-01-22 NOTE — Telephone Encounter (Signed)
 Copied from CRM [id number]. Topic: Appointments - Scheduling Inquiry for Clinic >> Jan 22, 2024  1:09 PM Ashley R wrote: Reason for CRM: Informing that she is now back in town to schedule Prolia  shot whenever available. Callback [phone]

## 2024-01-22 NOTE — Telephone Encounter (Signed)
 Pt scheduled for 01/24/24

## 2024-01-24 ENCOUNTER — Ambulatory Visit

## 2024-01-24 DIAGNOSIS — M79672 Pain in left foot: Secondary | ICD-10-CM | POA: Diagnosis not present

## 2024-01-24 DIAGNOSIS — M21612 Bunion of left foot: Secondary | ICD-10-CM | POA: Diagnosis not present

## 2024-01-24 DIAGNOSIS — M21611 Bunion of right foot: Secondary | ICD-10-CM | POA: Diagnosis not present

## 2024-01-24 DIAGNOSIS — M810 Age-related osteoporosis without current pathological fracture: Secondary | ICD-10-CM | POA: Diagnosis not present

## 2024-01-24 DIAGNOSIS — M79671 Pain in right foot: Secondary | ICD-10-CM | POA: Diagnosis not present

## 2024-01-24 MED ORDER — DENOSUMAB-BBDZ 60 MG/ML ~~LOC~~ SOSY: 60.0000 mg | PREFILLED_SYRINGE | Freq: Once | SUBCUTANEOUS | Status: AC

## 2024-01-24 NOTE — Progress Notes (Signed)
 Patient here for Jubbonti injection per physicians orders  Jubbonti 60 mg SQ , was administered left arm today. Patient tolerated injection.  Patient next injection due: 6 months, appt made:  No- will schedule in 5 months after benefits are ran again  Initial injection: yes for Jubbonti.  Had Prolia  last time.  Did Jubbonti come from pharmacy (if yes please select patient supplied): yes  Cam placed for next injection: yes

## 2024-02-04 ENCOUNTER — Other Ambulatory Visit: Payer: Self-pay | Admitting: Internal Medicine

## 2024-02-06 ENCOUNTER — Other Ambulatory Visit

## 2024-02-06 DIAGNOSIS — E0500 Thyrotoxicosis with diffuse goiter without thyrotoxic crisis or storm: Secondary | ICD-10-CM | POA: Diagnosis not present

## 2024-02-07 ENCOUNTER — Ambulatory Visit: Payer: Self-pay | Admitting: Internal Medicine

## 2024-02-07 DIAGNOSIS — E0580 Other thyrotoxicosis without thyrotoxic crisis or storm: Secondary | ICD-10-CM

## 2024-02-07 DIAGNOSIS — E0500 Thyrotoxicosis with diffuse goiter without thyrotoxic crisis or storm: Secondary | ICD-10-CM

## 2024-02-07 LAB — TSH: TSH: 1.93 m[IU]/L (ref 0.40–4.50)

## 2024-02-07 LAB — T4, FREE: Free T4: 0.8 ng/dL (ref 0.8–1.8)

## 2024-02-07 LAB — T3, FREE: T3, Free: 2.7 pg/mL (ref 2.3–4.2)

## 2024-02-07 MED ORDER — METHIMAZOLE 5 MG PO TABS: 5.0000 mg | ORAL_TABLET | Freq: Two times a day (BID) | ORAL | 2 refills | Status: DC

## 2024-02-07 NOTE — Addendum Note (Signed)
 Addended by: Theresa Dohrman M on: 02/07/2024 01:09 PM   Modules accepted: Orders

## 2024-02-13 DIAGNOSIS — G8918 Other acute postprocedural pain: Secondary | ICD-10-CM | POA: Diagnosis not present

## 2024-02-13 DIAGNOSIS — M25774 Osteophyte, right foot: Secondary | ICD-10-CM | POA: Diagnosis not present

## 2024-02-13 DIAGNOSIS — T8484XA Pain due to internal orthopedic prosthetic devices, implants and grafts, initial encounter: Secondary | ICD-10-CM | POA: Diagnosis not present

## 2024-02-13 DIAGNOSIS — M2011 Hallux valgus (acquired), right foot: Secondary | ICD-10-CM | POA: Diagnosis not present

## 2024-02-13 DIAGNOSIS — M21611 Bunion of right foot: Secondary | ICD-10-CM | POA: Diagnosis not present

## 2024-02-13 DIAGNOSIS — Z472 Encounter for removal of internal fixation device: Secondary | ICD-10-CM | POA: Diagnosis not present

## 2024-02-13 DIAGNOSIS — M19071 Primary osteoarthritis, right ankle and foot: Secondary | ICD-10-CM | POA: Diagnosis not present

## 2024-02-22 ENCOUNTER — Telehealth: Payer: Self-pay

## 2024-02-22 NOTE — Telephone Encounter (Signed)
 Patient called to request appt. Forwarded to [employer] team.

## 2024-02-29 ENCOUNTER — Ambulatory Visit

## 2024-02-29 VITALS — Ht 60.0 in | Wt 146.0 lb

## 2024-02-29 DIAGNOSIS — Z0000 Encounter for general adult medical examination without abnormal findings: Secondary | ICD-10-CM

## 2024-02-29 DIAGNOSIS — M810 Age-related osteoporosis without current pathological fracture: Secondary | ICD-10-CM

## 2024-02-29 NOTE — Progress Notes (Signed)
 Please attest this visit in the absence of patient primary care provider.   Chief Complaint  Patient presents with   Medicare Wellness     Subjective:   Rebecca [name] is a 83 y.o. female who presents for a Medicare Annual Wellness Visit.  Visit info / Clinical Intake: Medicare Wellness Visit Type:: Subsequent Annual Wellness Visit Persons participating in visit and providing information:: patient Medicare Wellness Visit Mode:: Telephone If telephone:: video declined Since this visit was completed virtually, some vitals may be partially provided or unavailable. Missing vitals are due to the limitations of the virtual format.: Unable to obtain vitals - no equipment If Telephone or Video please confirm:: I connected with patient using audio/video enable telemedicine. I verified patient identity with two identifiers, discussed telehealth limitations, and patient agreed to proceed. Patient Location:: home Provider Location:: office Interpreter Needed?: No Pre-visit prep was completed: yes AWV questionnaire completed by patient prior to visit?: no Living arrangements:: with family/others (son) Patient's Overall Health Status Rating: very good Typical amount of pain: none Does pain affect daily life?: no Are you currently prescribed opioids?: no  Dietary Habits and Nutritional Risks How many meals a day?: 2 Eats fruit and vegetables daily?: yes (doesn't consistently eat fruits/veggies) Most meals are obtained by: preparing own meals In the last 2 weeks, have you had any of the following?: none Diabetic:: no  Functional Status Activities of Daily Living (to include ambulation/medication): Independent Ambulation: Independent Medication Administration: Independent Home Management (perform basic housework or laundry): Independent Manage your own finances?: yes Primary transportation is: driving Concerns about vision?: no *vision screening is required for [facility]* (Up to date with  Dr Ladora Platts) Concerns about hearing?: no  Fall Screening Falls in the past year?: 0 Number of falls in past year: 0 Was there an injury with Fall?: 0 Fall Risk Category Calculator: 0 Patient Fall Risk Level: Low Fall Risk  Fall Risk Patient at Risk for Falls Due to: Orthopedic patient Fall risk Follow up: Falls evaluation completed  Home and Transportation Safety: All rugs have non-skid backing?: N/A, no rugs All stairs or steps have railings?: yes Grab bars in the bathtub or shower?: yes Have non-skid surface in bathtub or shower?: yes Good home lighting?: yes Regular seat belt use?: yes Hospital stays in the last year:: no  Cognitive Assessment Difficulty concentrating, remembering, or making decisions? : no Will 6CIT or Mini Cog be Completed: yes What year is it?: 0 points What month is it?: 0 points Give patient an address phrase to remember (5 components): [address], [place]  About what time is it?: 0 points Count backwards from 20 to 1: 0 points Say the months of the year in reverse: 0 points Repeat the address phrase from earlier: 0 points 6 CIT Score: 0 points  Advance Directives (For Healthcare) Does Patient Have a Medical Advance Directive?: Yes Does patient want to make changes to medical advance directive?: No - Patient declined Type of Advance Directive: Living will Copy of Healthcare Power of Attorney in Chart?: No - copy requested Copy of Living Will in Chart?: Yes - validated most recent copy scanned in chart (See row information)  Reviewed/Updated  Reviewed/Updated: Reviewed All (Medical, Surgical, Family, Medications, Allergies, Care Teams, Patient Goals)    Allergies (verified) Tape, Alendronate sodium, Oxycodone -acetaminophen , Cortisone, and Penicillins   Current Medications (verified) Outpatient Encounter Medications as of 02/29/2024  Medication Sig   acetaminophen  (TYLENOL ) 500 MG tablet Take 1,000 mg by mouth in the  morning and at bedtime.   CALCIUM  PO Take 1 tablet by mouth daily.   clonazePAM  (KLONOPIN ) 0.5 MG tablet Take 1 tablet (0.5 mg total) by mouth 3 (three) times daily as needed for anxiety.   denosumab -bbdz (JUBBONTI ) 60 MG/ML SOSY injection Inject 60 mg into the skin every 6 (six) months.   escitalopram  (LEXAPRO ) 10 MG tablet Take 1 tablet (10 mg total) by mouth daily.   loratadine (CLARITIN) 10 MG tablet Take 10 mg by mouth daily.   melatonin 5 MG TABS Take 5 mg by mouth at bedtime as needed (sleep).   methimazole  (TAPAZOLE ) 5 MG tablet Take 1 tablet (5 mg total) by mouth 2 (two) times daily.   methocarbamol  (ROBAXIN ) 500 MG tablet Take 1 tablet (500 mg total) by mouth every 8 (eight) hours as needed for muscle spasms.   Multiple Vitamin (MULTIVITAMIN) tablet Take 1 tablet by mouth daily.   Propylene Glycol (SYSTANE BALANCE) 0.6 % SOLN Place 1 drop into both eyes daily.   simvastatin  (ZOCOR ) 40 MG tablet Take 1 tablet (40 mg total) by mouth at bedtime.   vitamin B-12 (CYANOCOBALAMIN ) 100 MCG tablet Take 100 mcg by mouth daily.   vitamin C (ASCORBIC ACID) 500 MG tablet Take 500 mg by mouth daily.   Facility-Administered Encounter Medications as of 02/29/2024  Medication   [START ON 07/18/2024] denosumab -bbdz (JUBBONTI ) injection 60 mg    History: Past Medical History:  Diagnosis Date   Anxiety    Arthritis    degenerative lumbar spine    Chronic neck pain    Closed fracture of base of fifth metatarsal bone 01/2018   Left   Complication of anesthesia    at one time my blood pressure dropped -lumbar - on bp med at that time   Depression    Dysphagia    esophageal stretched 9-14    Fibromyalgia    Graves disease    History of blood transfusion 2009   post - lumbar fusion    Hyperlipidemia    Hypertension    No longer takes med.   Hyperthyroidism    Osteoarthritis of left shoulder region    Mild, Dr. Josefina   Osteoporosis    Past Surgical History:  Procedure Laterality Date    ABDOMINAL HYSTERECTOMY     ANTERIOR CERVICAL DECOMP/DISCECTOMY FUSION N/A 11/20/2014   Procedure: ANTERIOR CERVICAL DECOMPRESSION/DISCECTOMY FUSION 2 LEVELS;  Surgeon: Oneil Priestly, MD;  Location: [facility] OR;  Service: Orthopedics;  Laterality: N/A;  Anterior cervical decompression fusion, cervical 3-4, cervical 4-5 with instrumentation and allograft   arm surgery Bilateral    transposed ulner nerve 10 (dr. Camella)   BACK SURGERY  2009   Dr Onetha SANES SURGERY  01/2014   Dr Onetha   carpal tunnell  2006   bilateral   CHOLECYSTECTOMY     KNEE SURGERY  11/2009   right,  scope; left knee   NECK SURGERY  05/2008   Dr Onetha   OOPHORECTOMY Bilateral    PARTIAL KNEE ARTHROPLASTY  01/17/2012   LEFT KNEE   PARTIAL KNEE ARTHROPLASTY  01/17/2012   Procedure: UNICOMPARTMENTAL KNEE;  Surgeon: Fonda SHAUNNA Josefina, MD;  Location: [facility] OR;  Service: Orthopedics;  Laterality: Left;   PARTIAL KNEE ARTHROPLASTY Right 04/21/2020   Procedure: UNICOMPARTMENTAL KNEE;  Surgeon: Josefina Fonda, MD;  Location: [facility] ORS;  Service: Orthopedics;  Laterality: Right;   REVERSE SHOULDER ARTHROPLASTY Left 08/08/2023   Procedure: ARTHROPLASTY, SHOULDER, TOTAL, REVERSE;  Surgeon: Josefina Fonda, MD;  Location:  [facility] ORS;  Service: Orthopedics;  Laterality: Left;   REVERSE SHOULDER ARTHROPLASTY Right 11/21/2023   Procedure: ARTHROPLASTY, SHOULDER, TOTAL, REVERSE;  Surgeon: Josefina Chew, MD;  Location: [facility] ORS;  Service: Orthopedics;  Laterality: Right;   RHINOPLASTY  1980   SHOULDER SURGERY Bilateral    x 2/Dr. Carlos   thumb surgery Right 07/2022   Dr Camella   TONSILLECTOMY     Family History  Problem Relation Age of Onset   Prostate cancer Father    Diabetes Sister    Heart disease Sister        sister and brothers x 2   Kidney disease Sister    Kidney disease Brother    Heart disease Brother    Stroke Daughter    Stomach cancer Maternal Aunt    Colon cancer Neg Hx    Breast cancer Neg Hx    Esophageal cancer Neg Hx     Pancreatic cancer Neg Hx    Social History   Occupational History   Occupation: stay home   Tobacco Use   Smoking status: Never   Smokeless tobacco: Never  Vaping Use   Vaping status: Never Used  Substance and Sexual Activity   Alcohol use: Yes    Comment: rare   Drug use: No   Sexual activity: Not Currently   Tobacco Counseling Counseling given: Not Answered  SDOH Screenings   Food Insecurity: No Food Insecurity (02/29/2024)  Housing: Low Risk (02/29/2024)  Transportation Needs: No Transportation Needs (02/29/2024)  Utilities: Not At Risk (02/29/2024)  Alcohol Screen: Low Risk (10/13/2022)  Depression (PHQ2-9): Low Risk (02/29/2024)  Financial Resource Strain: Low Risk (10/13/2022)  Physical Activity: Inactive (02/29/2024)  Social Connections: Socially Integrated (02/29/2024)  Stress: No Stress Concern Present (02/29/2024)  Tobacco Use: Low Risk (02/29/2024)  Health Literacy: Adequate Health Literacy (10/13/2022)   See flowsheets for full screening details  Depression Screen PHQ 2 & 9 Depression Scale- Over the past 2 weeks, how often have you been bothered by any of the following problems? Little interest or pleasure in doing things: 0 Feeling down, depressed, or hopeless (PHQ Adolescent also includes...irritable): 0 PHQ-2 Total Score: 0 Trouble falling or staying asleep, or sleeping too much: 0 Feeling tired or having little energy: 0 Poor appetite or overeating (PHQ Adolescent also includes...weight loss): 0 Feeling bad about yourself - or that you are a failure or have let yourself or your family down: 0 Trouble concentrating on things, such as reading the newspaper or watching television (PHQ Adolescent also includes...like school work): 0 Moving or speaking so slowly that other people could have noticed. Or the opposite - being so fidgety or restless that you have been moving around a lot more than usual: 0 Thoughts that you would be better off dead, or of hurting  yourself in some way: 0 PHQ-9 Total Score: 0 If you checked off any problems, how difficult have these problems made it for you to do your work, take care of things at home, or get along with other people?: Not difficult at all  Depression Treatment Depression Interventions/Treatment : EYV7-0 Score <4 Follow-up Not Indicated     Goals Addressed   None          Objective:    Today's Vitals   02/29/24 0902  Weight: 146 lb (66.2 kg)  Height: 5' (1.524 m)   Body mass index is 28.51 kg/m.  Hearing/Vision screen No results found. Immunizations and Health Maintenance Health Maintenance  Topic Date Due  COVID-19 Vaccine (4 - 2025-26 season) 02/28/2025 (Originally 11/13/2023)   Medicare Annual Wellness (AWV)  02/28/2025   DTaP/Tdap/Td (2 - Tdap) 05/19/2026   Pneumococcal Vaccine: 50+ Years  Completed   Influenza Vaccine  Completed   Bone Density Scan  Completed   Zoster Vaccines- Shingrix   Completed   Meningococcal B Vaccine  Aged Out   Mammogram  Discontinued   Hepatitis C Screening  Discontinued        Assessment/Plan:  This is a routine wellness examination for [place].  Patient Care Team: Amon Aloysius BRAVO, MD as PCP - General Onetha Kuba, MD as Consulting Physician (Neurosurgery) Josefina Chew, MD as Consulting Physician (Orthopedic Surgery) Beuford Anes, MD as Consulting Physician (Orthopedic Surgery) Day, [place], [facility] (Inactive) as Pharmacist (Pharmacist) Trixie File, MD as Consulting Physician (Endocrinology)  I have personally reviewed and noted the following in the patients chart:   Medical and social history Use of alcohol, tobacco or illicit drugs  Current medications and supplements including opioid prescriptions. Functional ability and status Nutritional status Physical activity Advanced directives List of other physicians Hospitalizations, surgeries, and ER visits in previous 12 months Vitals Screenings to include cognitive, depression, and  falls Referrals and appointments  No orders of the defined types were placed in this encounter.  In addition, I have reviewed and discussed with patient certain preventive protocols, quality metrics, and best practice recommendations. A written personalized care plan for preventive services as well as general preventive health recommendations were provided to patient.   Lolita Libra, CMA   02/29/2024   Return in 1 year (on 02/28/2025).  After Visit Summary: (Mail) Due to this being a telephonic visit, the after visit summary with patients personalized plan was offered to patient via mail   Nurse Notes: Vaccines not given: Covid declined today HM Addressed: DEXA ordered

## 2024-02-29 NOTE — Patient Instructions (Addendum)
 Rebecca [name],  Thank you for taking the time for your Medicare Wellness Visit. I appreciate your continued commitment to your health goals. Please review the care plan we discussed, and feel free to reach out if I can assist you further.  Please note that Annual Wellness Visits do not include a physical exam. Some assessments may be limited, especially if the visit was conducted virtually. If needed, we may recommend an in-person follow-up with your provider.  Ongoing Care Seeing your primary care provider every 3 to 6 months helps us  monitor your health and provide consistent, personalized care.   Dr Amon: 05/28/24 2:20pm Medicare AWV:  03/04/25 9am, telephone  Referrals If a referral was made during today's visit and you haven't received any updates within two weeks, please contact the referred provider directly to check on the status.  Bone Density ([facility]) due 10/06/24:  [phone]  Recommended Screenings: You will need to get the following vaccines at your local pharmacy: Covid (if you change your mind)  Health Maintenance  Topic Date Due   Medicare Annual Wellness Visit  10/13/2023   COVID-19 Vaccine (4 - 2025-26 season) 02/28/2025*   DTaP/Tdap/Td vaccine (2 - Tdap) 05/19/2026   Pneumococcal Vaccine for age over 66  Completed   Flu Shot  Completed   Osteoporosis screening with Bone Density Scan  Completed   Zoster (Shingles) Vaccine  Completed   Meningitis B Vaccine  Aged Out   Breast Cancer Screening  Discontinued   Hepatitis C Screening  Discontinued  *Topic was postponed. The date shown is not the original due date.       02/29/2024    9:10 AM  Advanced Directives  Does Patient Have a Medical Advance Directive? Yes  Type of Advance Directive Living will  Does patient want to make changes to medical advance directive? No - Patient declined    Vision: Annual vision screenings are recommended for early detection of glaucoma, cataracts, and diabetic  retinopathy. These exams can also reveal signs of chronic conditions such as diabetes and high blood pressure.  Dental: Annual dental screenings help detect early signs of oral cancer, gum disease, and other conditions linked to overall health, including heart disease and diabetes.  Please see the attached documents for additional preventive care recommendations.

## 2024-03-17 ENCOUNTER — Other Ambulatory Visit: Payer: Self-pay | Admitting: Internal Medicine

## 2024-03-18 NOTE — Telephone Encounter (Signed)
 Requesting: clonazepam  0.5mg  Contract:03/23/22 UDS:12/29/23 Last Visit: 12/29/23 Next Visit:05/28/24 Last Refill: 11/06/23 #270 and 0RF   Please Advise

## 2024-03-18 NOTE — Telephone Encounter (Signed)
 Please call patient: Prescription sent, I noticed she is getting a small amount of tramadol .  That may increase drowsiness.   Recommend to watch carefully for excessive drowsiness, attempt to decrease clonazepam  to twice daily only

## 2024-03-18 NOTE — Telephone Encounter (Signed)
 Spoke w/ Pt- informed of PCP recommendations. States she has not taken tramadol  since she had to have procedure on her big toe in December.

## 2024-03-28 ENCOUNTER — Ambulatory Visit: Admitting: Internal Medicine

## 2024-03-28 ENCOUNTER — Encounter: Payer: Self-pay | Admitting: Internal Medicine

## 2024-03-28 ENCOUNTER — Other Ambulatory Visit

## 2024-03-28 VITALS — BP 132/70 | HR 66 | Ht 60.0 in | Wt 161.0 lb

## 2024-03-28 DIAGNOSIS — E0500 Thyrotoxicosis with diffuse goiter without thyrotoxic crisis or storm: Secondary | ICD-10-CM | POA: Diagnosis not present

## 2024-03-28 DIAGNOSIS — E041 Nontoxic single thyroid nodule: Secondary | ICD-10-CM | POA: Diagnosis not present

## 2024-03-28 NOTE — Progress Notes (Signed)
 Patient ID: Rebecca [name], female   DOB: Feb 20, 1941, 84 y.o.   MRN: [id number]  HPI  Rebecca [name] is a pleasant 84 y.o.-year-old female, initially referred by her PCP, Dr. Amon, for evaluation and management of Graves' disease.  Last visit 4 months ago.  Interim history: She had left shoulder surgery early last year (08/08/2023) - still having some muscular pain, and had right shoulder surgery since last visit (11/21/2023). She also had bunion surgery (02/13/2024) -her right foot is in a boot today She is otherwise feeling very well, without complaints other than weight gain. No tremors, palpitations, heat intolerance.  Reviewed and ended history: In 26-Apr-2023, patient was found to have an undetectable TSH on labs checked for weight loss. She describes that she started to lose weight when her husband was sick with multiple myeloma in 84 2019-04-26.  He passed away January 24, 2020.  They were married 62 years.   Around that time, she lost 18 pounds in 6 months, developed tremors, significant fatigue and weakness to the point that she could not lift arms to comb her hair (but also had significant shoulder pain), pruritus over her body and especially head, and also diarrhea.  She also had anxiety but no palpitations or insomnia.  She described shortness of breath when lying down at night, but without associated palpitations or chest pain.  At our first visit in 04/2023 she described significant right shoulder pain for which surgery was scheduled at the end of the month.  This got postponed due to her thyroid  tests being abnormal.  In 04/2023 we started methimazole  5 mg twice a day.   In 07/2023, we decreased methimazole  to 5 mg in a.m. and 2.5 mg in p.m. In 08/2023, we decreased the methimazole  to 5 mg in a.m.  I reviewed pt's thyroid  tests: Lab Results  Component Value Date   TSH 1.93 02/06/2024   TSH 0.04 (L) 12/29/2023   TSH 0.42 11/20/2023   TSH 0.44 10/25/2023   TSH 0.27 (L) 09/20/2023   TSH 5.25  (H) 08/25/2023   TSH 2.61 07/18/2023   TSH 0.01 (L) 05/18/2023   TSH 0.01 (L) 05/04/2023   TSH 0.01 (L) 04/19/2023   FREET4 0.8 02/06/2024   FREET4 1.2 12/29/2023   FREET4 1.1 11/20/2023   FREET4 0.9 10/25/2023   FREET4 1.1 09/20/2023   FREET4 0.8 08/25/2023   FREET4 0.8 07/18/2023   FREET4 1.4 05/18/2023   FREET4 2.0 (H) 05/04/2023   FREET4 2.7 (H) 04/19/2023   T3FREE 2.7 02/06/2024   T3FREE 3.6 12/29/2023   T3FREE 3.4 11/20/2023   T3FREE 3.0 10/25/2023   T3FREE 3.6 09/20/2023   T3FREE 3.0 08/25/2023   T3FREE 2.8 07/18/2023   T3FREE 4.7 (H) 05/18/2023   T3FREE 6.4 (H) 05/04/2023   T3FREE 7.7 (H) 04/19/2023   Antithyroid antibodies: Lab Results  Component Value Date   TSI 407 (H) 04/19/2023   She also has a history of a thyroid  nodule per review of available records:  Carotid ultrasound (05/23/2008):  A tiny incidental low density nodule is detected in the right lobe  of the thyroid  gland.  This is well under 1 cm in diameter and  likely benign.   Pt denies: - feeling nodules in neck - dysphagia - choking  Pt does have a FH of thyroid  ds. - daughter thyroid  nodule - had thyroid  sx. No FH of thyroid  cancer. No h/o radiation tx to head or neck. No recent contrast studies. No herbal supplements. No Biotin.  On  B12.  She has a history of shoulder pain and sees Beverley Millman.  She was getting steroid (Kenalog) injections, but not recently.  Pt. has a history of  transaminitis -blood tests normalized 07/2023: Lab Results  Component Value Date   ALT 25 12/29/2023   AST 26 12/29/2023   ALKPHOS 55 07/27/2023   BILITOT 0.8 07/27/2023   Previously: Component     Latest Ref Rng 04/12/2023 06/28/2023 07/11/2023  Total Bilirubin     0.2 - 1.2 mg/dL 0.5  0.7  0.6   Alkaline Phosphatase     39 - 117 U/L 59  61    AST     10 - 35 U/L 23  44 (H)  43 (H)   ALT     6 - 29 U/L 16  36 (H)  33 (H)   Total Protein     6.1 - 8.1 g/dL 6.4  6.5  6.6     She also has HL,   osteoporosis (on Prolia , first dose 11/17/2022), thrombocytopenia (with normal white blood cell count 04/12/2023).  She also has a history of back surgery and partial bilateral knee replacements.  ROS: + See HPI, + hypoacusis, + shortness of breath  Past Medical History:  Diagnosis Date   Anxiety    Arthritis    degenerative lumbar spine    Chronic neck pain    Closed fracture of base of fifth metatarsal bone 01/2018   Left   Complication of anesthesia    at one time my blood pressure dropped -lumbar - on bp med at that time   Depression    Dysphagia    esophageal stretched 9-14    Fibromyalgia    Graves disease    History of blood transfusion 2009   post - lumbar fusion    Hyperlipidemia    Hypertension    No longer takes med.   Hyperthyroidism    Osteoarthritis of left shoulder region    Mild, Dr. Josefina   Osteoporosis    Past Surgical History:  Procedure Laterality Date   ABDOMINAL HYSTERECTOMY     ANTERIOR CERVICAL DECOMP/DISCECTOMY FUSION N/A 11/20/2014   Procedure: ANTERIOR CERVICAL DECOMPRESSION/DISCECTOMY FUSION 2 LEVELS;  Surgeon: Oneil Priestly, MD;  Location: [facility] OR;  Service: Orthopedics;  Laterality: N/A;  Anterior cervical decompression fusion, cervical 3-4, cervical 4-5 with instrumentation and allograft   arm surgery Bilateral    transposed ulner nerve 10 (dr. Camella)   BACK SURGERY  2009   Dr Onetha SANES SURGERY  01/2014   Dr Onetha   carpal tunnell  2006   bilateral   CHOLECYSTECTOMY     KNEE SURGERY  11/2009   right,  scope; left knee   NECK SURGERY  05/2008   Dr Onetha   OOPHORECTOMY Bilateral    PARTIAL KNEE ARTHROPLASTY  01/17/2012   LEFT KNEE   PARTIAL KNEE ARTHROPLASTY  01/17/2012   Procedure: UNICOMPARTMENTAL KNEE;  Surgeon: Fonda SHAUNNA Josefina, MD;  Location: [facility] OR;  Service: Orthopedics;  Laterality: Left;   PARTIAL KNEE ARTHROPLASTY Right 04/21/2020   Procedure: UNICOMPARTMENTAL KNEE;  Surgeon: Josefina Fonda, MD;  Location: [facility] ORS;  Service:  Orthopedics;  Laterality: Right;   REVERSE SHOULDER ARTHROPLASTY Left 08/08/2023   Procedure: ARTHROPLASTY, SHOULDER, TOTAL, REVERSE;  Surgeon: Josefina Fonda, MD;  Location: [facility] ORS;  Service: Orthopedics;  Laterality: Left;   REVERSE SHOULDER ARTHROPLASTY Right 11/21/2023   Procedure: ARTHROPLASTY, SHOULDER, TOTAL, REVERSE;  Surgeon: Josefina Fonda, MD;  Location: [facility] ORS;  Service: Orthopedics;  Laterality: Right;   RHINOPLASTY  1980   SHOULDER SURGERY Bilateral    x 2/Dr. Carlos   thumb surgery Right 07/2022   Dr Camella   TONSILLECTOMY     Social History   Socioeconomic History   Marital status: Widowed    Spouse name: Lynwood BROCKS.   Number of children: 4   Years of education: Not on file   Highest education level: Not on file  Occupational History   Occupation: stay home   Tobacco Use   Smoking status: Never   Smokeless tobacco: Never  Vaping Use   Vaping status: Never Used  Substance and Sexual Activity   Alcohol use: Yes    Comment: rare   Drug use: No   Sexual activity: Not Currently  Other Topics Concern   Not on file  Social History Narrative   Lost husband 2021   Household: pt and son Signe   Children: 3 daughters and 1 son   Social Drivers of Health   Tobacco Use: Low Risk (02/29/2024)   Patient History    Smoking Tobacco Use: Never    Smokeless Tobacco Use: Never    Passive Exposure: Not on file  Financial Resource Strain: Low Risk (10/13/2022)   Overall Financial Resource Strain (CARDIA)    Difficulty of Paying Living Expenses: Not hard at all  Food Insecurity: No Food Insecurity (02/29/2024)   Epic    Worried About Radiation Protection Practitioner of Food in the Last Year: Never true    Ran Out of Food in the Last Year: Never true  Transportation Needs: No Transportation Needs (02/29/2024)   Epic    Lack of Transportation (Medical): No    Lack of Transportation (Non-Medical): No  Physical Activity: Inactive (02/29/2024)   Exercise Vital Sign    Days of Exercise per Week: 0  days    Minutes of Exercise per Session: 0 min  Stress: No Stress Concern Present (02/29/2024)   [employer] of Occupational Health - Occupational Stress Questionnaire    Feeling of Stress: Not at all  Social Connections: Socially Integrated (02/29/2024)   Social Connection and Isolation Panel    Frequency of Communication with Friends and Family: More than three times a week    Frequency of Social Gatherings with Friends and Family: More than three times a week    Attends Religious Services: More than 4 times per year    Active Member of Clubs or Organizations: Yes    Attends Banker Meetings: More than 4 times per year    Marital Status: Married  Catering Manager Violence: Not At Risk (02/29/2024)   Epic    Fear of Current or Ex-Partner: No    Emotionally Abused: No    Physically Abused: No    Sexually Abused: No  Depression (PHQ2-9): Low Risk (02/29/2024)   Depression (PHQ2-9)    PHQ-2 Score: 0  Alcohol Screen: Low Risk (10/13/2022)   Alcohol Screen    Last Alcohol Screening Score (AUDIT): 0  Housing: Low Risk (02/29/2024)   Epic    Unable to Pay for Housing in the Last Year: No    Number of Times Moved in the Last Year: 0    Homeless in the Last Year: No  Utilities: Not At Risk (02/29/2024)   Epic    Threatened with loss of utilities: No  Health Literacy: Adequate Health Literacy (10/13/2022)   B1300 Health Literacy    Frequency of need for help with medical instructions: Never  Current Outpatient Medications on File Prior to Visit  Medication Sig Dispense Refill   acetaminophen  (TYLENOL ) 500 MG tablet Take 1,000 mg by mouth in the morning and at bedtime.     CALCIUM  PO Take 1 tablet by mouth daily.     clonazePAM  (KLONOPIN ) 0.5 MG tablet TAKE 1 TABLET BY MOUTH 3 TIMES A DAY AS NEEDED FOR ANXIETY 270 tablet 0   denosumab -bbdz (JUBBONTI ) 60 MG/ML SOSY injection Inject 60 mg into the skin every 6 (six) months. 1 mL 0   escitalopram  (LEXAPRO ) 10 MG tablet  Take 1 tablet (10 mg total) by mouth daily. 90 tablet 1   loratadine (CLARITIN) 10 MG tablet Take 10 mg by mouth daily.     melatonin 5 MG TABS Take 5 mg by mouth at bedtime as needed (sleep).     methimazole  (TAPAZOLE ) 5 MG tablet Take 1 tablet (5 mg total) by mouth 2 (two) times daily. 60 tablet 2   methocarbamol  (ROBAXIN ) 500 MG tablet Take 1 tablet (500 mg total) by mouth every 8 (eight) hours as needed for muscle spasms. 30 tablet 0   Multiple Vitamin (MULTIVITAMIN) tablet Take 1 tablet by mouth daily.     Propylene Glycol (SYSTANE BALANCE) 0.6 % SOLN Place 1 drop into both eyes daily.     simvastatin  (ZOCOR ) 40 MG tablet Take 1 tablet (40 mg total) by mouth at bedtime. 90 tablet 1   vitamin B-12 (CYANOCOBALAMIN ) 100 MCG tablet Take 100 mcg by mouth daily.     vitamin C (ASCORBIC ACID) 500 MG tablet Take 500 mg by mouth daily.     Current Facility-Administered Medications on File Prior to Visit  Medication Dose Route Frequency Provider Last Rate Last Admin   [START ON 07/18/2024] denosumab -bbdz (JUBBONTI ) injection 60 mg  60 mg Subcutaneous Once Paz, Jose E, MD       Allergies  Allergen Reactions   Tape Itching and Other (See Comments)    White adhesive tape -blisters   Alendronate Sodium Other (See Comments)    jaw pain   Oxycodone -Acetaminophen  Hives   Cortisone     Pt received cortisone shot, and became very flushed, and hot, blisters, raw areas around lips & ears    Penicillins Swelling and Rash   Family History  Problem Relation Age of Onset   Prostate cancer Father    Diabetes Sister    Heart disease Sister        sister and brothers x 2   Kidney disease Sister    Kidney disease Brother    Heart disease Brother    Stroke Daughter    Stomach cancer Maternal Aunt    Colon cancer Neg Hx    Breast cancer Neg Hx    Esophageal cancer Neg Hx    Pancreatic cancer Neg Hx    PE: BP 132/70   Pulse 66   Ht 5' (1.524 m)   Wt 161 lb (73 kg)   SpO2 98%   BMI 31.44 kg/m  Wt  Readings from Last 10 Encounters:  03/28/24 161 lb (73 kg)  02/29/24 146 lb (66.2 kg)  12/29/23 146 lb 2 oz (66.3 kg)  11/21/23 137 lb (62.1 kg)  11/20/23 137 lb (62.1 kg)  11/15/23 137 lb (62.1 kg)  08/08/23 127 lb (57.6 kg)  07/27/23 136 lb (61.7 kg)  07/18/23 135 lb 12.8 oz (61.6 kg)  06/28/23 136 lb (61.7 kg)   Constitutional: Slightly overweight, in NAD Eyes:  EOMI, no exophthalmos, + mild  B stare ENT: no neck masses, no cervical lymphadenopathy Cardiovascular: RRR, No MRG Respiratory: CTA B Musculoskeletal: no deformities, + R foot in boot Skin:no rashes Neurological:  no tremor with outstretched hands  ASSESSMENT: 1. Thyrotoxicosis  2.  Thyroid  nodule  PLAN:  1. Patient with history of thyrotoxicosis with thyrotoxic symptoms including weight loss (18 pounds in 6 months), increased fatigue, weakness, hyperdefecation, tremors -At our first visit, we discussed about potential causes for the abnormal TFTs.  She was not on amiodarone, lithium, excessive doses of biotin, but had a steroid injection in the shoulder 3 weeks prior to the abnormal TSH.  She did not have thyrotoxic signs or symptoms, and, indeed, subsequent TFTs confirm thyrotoxicosis.  Also, reviewing her chart, she had abnormal thyroid  tests detected after 01/2021, while at previous checks, all TFTs going back to 2008 were normal. - Her TSI's were elevated, confirming Graves' disease. - We discussed about different modalities of treatment for Graves' disease to include methimazole , RAI treatment, and surgery.  We ended up starting methimazole  at 5 mg twice a day and we were able to decrease the dose gradually, to 5 mg daily in 08/2023.  However, her TSH was again suppressed 12/29/2023 so we increased the dose of methimazole  to 5 mg twice a day.  She continues on this dose. - Her LFTs increased slightly initially but they normalized afterwards.  At last check, in 12/2023, they were normal. - Today's visit, he denies  tremors, palpitations, and her fatigue resolved.  Before last visit she gained 10 pounds, previously having lost 6 pounds.  She gained 15 pounds since last visit.  She does mention that she is not very active and she weighed with her boot on - she has no active signs of Graves' ophthalmopathy: No blurry vision, double vision, eye pain, chemosis but she continues to have xerostomia and mild stare -Will recheck her TFTs today and change the methimazole  dose accordingly - I will see her back in 6 mo  2.  Thyroid  nodule - She has a subcentimeter thyroid  nodule per review of the carotid ultrasound result from 2010 - However, she has not made compression symptoms or masses felt on palpation of her neck today - No further imaging tests are necessary, we will continue to follow her clinically  Needs refills.  Orders Placed This Encounter  Procedures   TSH   T4, free   T3, free   Lela Fendt, MD PhD [facility]

## 2024-03-28 NOTE — Patient Instructions (Addendum)
 Please continue Methimazole  5 mg 2x daily.  Please stop at the lab.  You should have an endocrinology follow-up appointment in 6 months.

## 2024-03-29 ENCOUNTER — Ambulatory Visit: Payer: Self-pay | Admitting: Internal Medicine

## 2024-03-29 DIAGNOSIS — E041 Nontoxic single thyroid nodule: Secondary | ICD-10-CM | POA: Insufficient documentation

## 2024-03-29 LAB — T4, FREE: Free T4: 0.7 ng/dL — ABNORMAL LOW (ref 0.8–1.8)

## 2024-03-29 LAB — T3, FREE: T3, Free: 2.6 pg/mL (ref 2.3–4.2)

## 2024-03-29 LAB — TSH: TSH: 7.92 m[IU]/L — ABNORMAL HIGH (ref 0.40–4.50)

## 2024-03-29 MED ORDER — METHIMAZOLE 5 MG PO TABS: 5.0000 mg | ORAL_TABLET | Freq: Every day | ORAL | Status: AC

## 2024-03-29 NOTE — Addendum Note (Signed)
 Addended by: TRIXIE FILE on: 03/29/2024 04:22 PM   Modules accepted: Orders

## 2024-04-22 ENCOUNTER — Ambulatory Visit: Admitting: Internal Medicine

## 2024-04-22 ENCOUNTER — Other Ambulatory Visit

## 2024-05-21 ENCOUNTER — Ambulatory Visit: Admitting: Internal Medicine

## 2024-05-28 ENCOUNTER — Ambulatory Visit: Admitting: Internal Medicine

## 2024-09-30 ENCOUNTER — Ambulatory Visit: Admitting: Internal Medicine

## 2025-03-04 ENCOUNTER — Ambulatory Visit
